# Patient Record
Sex: Male | Born: 1945
Health system: Southern US, Community
[De-identification: ages and names within clinical notes are randomized; demographics above are authoritative.]

## PROBLEM LIST (undated history)

## (undated) DIAGNOSIS — R569 Unspecified convulsions: Secondary | ICD-10-CM

## (undated) DIAGNOSIS — E559 Vitamin D deficiency, unspecified: Secondary | ICD-10-CM

## (undated) DIAGNOSIS — F1010 Alcohol abuse, uncomplicated: Secondary | ICD-10-CM

## (undated) DIAGNOSIS — E538 Deficiency of other specified B group vitamins: Secondary | ICD-10-CM

## (undated) DIAGNOSIS — G40909 Epilepsy, unspecified, not intractable, without status epilepticus: Secondary | ICD-10-CM

## (undated) DIAGNOSIS — R7989 Other specified abnormal findings of blood chemistry: Secondary | ICD-10-CM

## (undated) DIAGNOSIS — J449 Chronic obstructive pulmonary disease, unspecified: Secondary | ICD-10-CM

## (undated) DIAGNOSIS — E785 Hyperlipidemia, unspecified: Secondary | ICD-10-CM

## (undated) DIAGNOSIS — H3530 Unspecified macular degeneration: Secondary | ICD-10-CM

## (undated) HISTORY — DX: Vitamin D deficiency, unspecified: E55.9

## (undated) HISTORY — DX: Other specified abnormal findings of blood chemistry: R79.89

## (undated) HISTORY — DX: Unspecified convulsions: R56.9

## (undated) HISTORY — DX: Alcohol abuse, uncomplicated: F10.10

## (undated) HISTORY — DX: Deficiency of other specified B group vitamins: E53.8

## (undated) HISTORY — DX: Epilepsy, unspecified, not intractable, without status epilepticus: G40.909

## (undated) HISTORY — DX: Chronic obstructive pulmonary disease, unspecified: J44.9

## (undated) HISTORY — DX: Hyperlipidemia, unspecified: E78.5

## (undated) HISTORY — DX: Unspecified macular degeneration: H35.30

---

## 1998-04-26 ENCOUNTER — Emergency Department (HOSPITAL_COMMUNITY): Admission: EM | Admit: 1998-04-26 | Discharge: 1998-04-26 | Payer: Self-pay | Admitting: Emergency Medicine

## 1998-04-26 ENCOUNTER — Encounter: Payer: Self-pay | Admitting: Emergency Medicine

## 2000-03-15 ENCOUNTER — Emergency Department (HOSPITAL_COMMUNITY): Admission: EM | Admit: 2000-03-15 | Discharge: 2000-03-15 | Payer: Self-pay | Admitting: Emergency Medicine

## 2000-03-16 ENCOUNTER — Encounter: Payer: Self-pay | Admitting: Emergency Medicine

## 2000-07-03 ENCOUNTER — Encounter: Payer: Self-pay | Admitting: Emergency Medicine

## 2000-07-03 ENCOUNTER — Emergency Department (HOSPITAL_COMMUNITY): Admission: EM | Admit: 2000-07-03 | Discharge: 2000-07-03 | Payer: Self-pay | Admitting: Emergency Medicine

## 2000-07-21 HISTORY — PX: CARDIAC CATHETERIZATION: SHX172

## 2000-09-21 ENCOUNTER — Ambulatory Visit (HOSPITAL_COMMUNITY): Admission: RE | Admit: 2000-09-21 | Discharge: 2000-09-21 | Payer: Self-pay | Admitting: Cardiology

## 2005-04-10 ENCOUNTER — Emergency Department (HOSPITAL_COMMUNITY): Admission: EM | Admit: 2005-04-10 | Discharge: 2005-04-10 | Payer: Self-pay | Admitting: Emergency Medicine

## 2005-08-16 ENCOUNTER — Emergency Department (HOSPITAL_COMMUNITY): Admission: EM | Admit: 2005-08-16 | Discharge: 2005-08-16 | Payer: Self-pay | Admitting: Emergency Medicine

## 2007-11-23 ENCOUNTER — Emergency Department (HOSPITAL_COMMUNITY): Admission: EM | Admit: 2007-11-23 | Discharge: 2007-11-23 | Payer: Self-pay | Admitting: Emergency Medicine

## 2010-12-06 NOTE — Cardiovascular Report (Signed)
[place]. [facility]  Patient:    Joshua Reed, Joshua Reed                       MRN: [id number] Proc. Date: 09/21/00 Attending:  Armanda Magic, M.D. CC:         Crista Luria, M.D.   Cardiac Catheterization  PROCEDURE:  Left heart catheterization.  INDICATIONS:  Chest pain.  COMPLICATIONS:  None.  HISTORY OF PRESENT ILLNESS:  This is a 65 year old white male with no significant cardiac history in the past but multiple cardiac risk factors including tobacco abuse, alcohol abuse, age, and sex who has been having episodic substernal chest pain with radiation to the left arm and multiple episodes of PND.  He now presents for cardiac catheterization.  DESCRIPTION OF PROCEDURE:  The patient is brought to the cardiac catheterization laboratory in a fasting nonsedated state.  Informed consent was obtained.  The patient was connected to continuous heart rate and pulse oximetry monitoring and intermittent blood pressure monitoring.  The right groin was prepped and draped in a sterile fashion.  Lidocaine, 1%, was used for local anesthesia.  Using the modified Seldinger technique, a 6-French sheath was placed in the right femoral artery.  Under fluoroscopic guidance, a 6-French JL4 catheter was placed into the left coronary artery.  Multiple cine films were taken in the RAO 30-degree and LAO 45-degree angles with cranial and caudal views.  This catheter was then exchanged out over a guide wire for a 6-French JR4 catheter which was placed under fluoroscopic guidance into the right coronary artery.  Multiple cine films were taken again in the 30-degree RAO and 45-degree LAO cranial views.  This catheter was then exchanged out over a guide wire for a 6-French angled pigtail catheter which was placed in the left ventricular cavity.  Left ventriculography was performed in the 30-degree RAO position.  The catheter was then pulled back across the aortic valve and no significant  gradient was obtained.  The catheter was then removed over a guide wire.  At the end of the procedure, all catheters and sheaths were removed.  Manual compression was performed until adequate hemostasis was obtained.  The patient was transferred back to her room in stable condition.  RESULTS: 1. The left main coronary artery is widely patent and bifurcates into a left    anterior descending artery and left circumflex. 2. The left anterior descending artery gives rise to 2 diagonal branches which    are widely patent.  The left anterior descending artery traverses to the    apex and is widely patent throughout its course. 3. The left circumflex artery gives rise to 1 very large branching obtuse    marginal with 2 branches which is widely patent.  The left circumflex then    traverses the AV groove and trifurcates into 3 daughter vessels, all of    which are widely patent. 4. The right coronary artery is widely patent throughout its course and    bifurcates into a posterolateral coronary artery and posterior descending    artery, all of which are widely patent.  The left ventriculography, performed in the RAO position, showed normal left ventricular function, no wall motion abnormalities.  Ejection fraction was 55%.  IMPRESSION: 1. Normal coronary arteries. 2. Noncardiac chest pain. 3. Normal left ventricular function.  PLAN:  Discharge to home later today.  Follow up with Dr. Mayford Knife in 2 weeks. Follow up with Dr. Crista Luria for GI and  possible pulmonary workup. DD:  09/21/00 TD:  09/21/00 Job: [id number]

## 2013-04-15 ENCOUNTER — Other Ambulatory Visit: Payer: Self-pay | Admitting: Family Medicine

## 2013-04-15 DIAGNOSIS — F17200 Nicotine dependence, unspecified, uncomplicated: Secondary | ICD-10-CM

## 2013-05-11 ENCOUNTER — Ambulatory Visit
Admission: RE | Admit: 2013-05-11 | Discharge: 2013-05-11 | Disposition: A | Discharge status: Home / Self Care | Payer: No Typology Code available for payment source | Source: Ambulatory Visit | Attending: Family Medicine | Admitting: Family Medicine

## 2013-05-11 ENCOUNTER — Other Ambulatory Visit: Payer: Self-pay | Admitting: Gastroenterology

## 2013-05-11 ENCOUNTER — Ambulatory Visit
Admission: RE | Admit: 2013-05-11 | Discharge: 2013-05-11 | Disposition: A | Discharge status: Home / Self Care | Payer: No Typology Code available for payment source | Source: Ambulatory Visit | Attending: Gastroenterology | Admitting: Gastroenterology

## 2013-05-11 DIAGNOSIS — F17200 Nicotine dependence, unspecified, uncomplicated: Secondary | ICD-10-CM

## 2013-05-11 DIAGNOSIS — R1011 Right upper quadrant pain: Secondary | ICD-10-CM

## 2013-05-13 ENCOUNTER — Other Ambulatory Visit: Payer: Self-pay

## 2013-05-17 ENCOUNTER — Other Ambulatory Visit: Payer: Self-pay

## 2013-05-18 ENCOUNTER — Ambulatory Visit: Payer: Medicare Other | Admitting: Neurology

## 2013-11-11 ENCOUNTER — Ambulatory Visit
Admission: RE | Admit: 2013-11-11 | Discharge: 2013-11-11 | Disposition: A | Discharge status: Home / Self Care | Payer: Medicare HMO | Source: Ambulatory Visit | Attending: Otolaryngology | Admitting: Otolaryngology

## 2013-11-11 ENCOUNTER — Other Ambulatory Visit: Payer: Self-pay | Admitting: Otolaryngology

## 2013-11-11 DIAGNOSIS — J0100 Acute maxillary sinusitis, unspecified: Secondary | ICD-10-CM

## 2013-11-11 DIAGNOSIS — J40 Bronchitis, not specified as acute or chronic: Secondary | ICD-10-CM

## 2014-01-13 ENCOUNTER — Other Ambulatory Visit: Payer: Self-pay | Admitting: Family Medicine

## 2014-01-13 DIAGNOSIS — R9389 Abnormal findings on diagnostic imaging of other specified body structures: Secondary | ICD-10-CM

## 2014-01-19 ENCOUNTER — Ambulatory Visit
Admission: RE | Admit: 2014-01-19 | Discharge: 2014-01-19 | Disposition: A | Discharge status: Home / Self Care | Payer: Medicare HMO | Source: Ambulatory Visit | Attending: Family Medicine | Admitting: Family Medicine

## 2014-01-19 DIAGNOSIS — R9389 Abnormal findings on diagnostic imaging of other specified body structures: Secondary | ICD-10-CM

## 2014-05-24 ENCOUNTER — Emergency Department (HOSPITAL_COMMUNITY): Payer: Worker's Compensation

## 2014-05-24 ENCOUNTER — Encounter (HOSPITAL_COMMUNITY)
Admission: EM | Disposition: A | Discharge status: Rehabilitation Facility | Payer: Self-pay | Source: Home / Self Care | Attending: Internal Medicine

## 2014-05-24 ENCOUNTER — Inpatient Hospital Stay (HOSPITAL_COMMUNITY): Payer: Worker's Compensation | Admitting: Anesthesiology

## 2014-05-24 ENCOUNTER — Inpatient Hospital Stay (HOSPITAL_COMMUNITY): Payer: Worker's Compensation

## 2014-05-24 ENCOUNTER — Encounter (HOSPITAL_COMMUNITY): Payer: Self-pay

## 2014-05-24 ENCOUNTER — Inpatient Hospital Stay (HOSPITAL_COMMUNITY)
Admission: EM | Admit: 2014-05-24 | Discharge: 2014-05-29 | DRG: 481 | Disposition: A | Discharge status: Rehabilitation Facility | Payer: Worker's Compensation | Attending: Internal Medicine | Admitting: Internal Medicine

## 2014-05-24 DIAGNOSIS — F1010 Alcohol abuse, uncomplicated: Secondary | ICD-10-CM | POA: Diagnosis not present

## 2014-05-24 DIAGNOSIS — S52601A Unspecified fracture of lower end of right ulna, initial encounter for closed fracture: Secondary | ICD-10-CM

## 2014-05-24 DIAGNOSIS — S52501A Unspecified fracture of the lower end of right radius, initial encounter for closed fracture: Secondary | ICD-10-CM | POA: Diagnosis present

## 2014-05-24 DIAGNOSIS — T148XXA Other injury of unspecified body region, initial encounter: Secondary | ICD-10-CM

## 2014-05-24 DIAGNOSIS — M25551 Pain in right hip: Secondary | ICD-10-CM | POA: Insufficient documentation

## 2014-05-24 DIAGNOSIS — J441 Chronic obstructive pulmonary disease with (acute) exacerbation: Secondary | ICD-10-CM | POA: Diagnosis present

## 2014-05-24 DIAGNOSIS — G40909 Epilepsy, unspecified, not intractable, without status epilepticus: Secondary | ICD-10-CM | POA: Diagnosis present

## 2014-05-24 DIAGNOSIS — H3530 Unspecified macular degeneration: Secondary | ICD-10-CM | POA: Diagnosis present

## 2014-05-24 DIAGNOSIS — J449 Chronic obstructive pulmonary disease, unspecified: Secondary | ICD-10-CM | POA: Diagnosis present

## 2014-05-24 DIAGNOSIS — E559 Vitamin D deficiency, unspecified: Secondary | ICD-10-CM | POA: Diagnosis present

## 2014-05-24 DIAGNOSIS — S52611A Displaced fracture of right ulna styloid process, initial encounter for closed fracture: Secondary | ICD-10-CM | POA: Diagnosis present

## 2014-05-24 DIAGNOSIS — E440 Moderate protein-calorie malnutrition: Secondary | ICD-10-CM | POA: Insufficient documentation

## 2014-05-24 DIAGNOSIS — F17200 Nicotine dependence, unspecified, uncomplicated: Secondary | ICD-10-CM | POA: Diagnosis present

## 2014-05-24 DIAGNOSIS — W11XXXA Fall on and from ladder, initial encounter: Secondary | ICD-10-CM | POA: Insufficient documentation

## 2014-05-24 DIAGNOSIS — J410 Simple chronic bronchitis: Secondary | ICD-10-CM

## 2014-05-24 DIAGNOSIS — M21939 Unspecified acquired deformity of unspecified forearm: Secondary | ICD-10-CM

## 2014-05-24 DIAGNOSIS — S72002A Fracture of unspecified part of neck of left femur, initial encounter for closed fracture: Secondary | ICD-10-CM | POA: Insufficient documentation

## 2014-05-24 DIAGNOSIS — D62 Acute posthemorrhagic anemia: Secondary | ICD-10-CM | POA: Diagnosis not present

## 2014-05-24 DIAGNOSIS — S72141A Displaced intertrochanteric fracture of right femur, initial encounter for closed fracture: Secondary | ICD-10-CM | POA: Diagnosis present

## 2014-05-24 DIAGNOSIS — S72009A Fracture of unspecified part of neck of unspecified femur, initial encounter for closed fracture: Secondary | ICD-10-CM | POA: Diagnosis present

## 2014-05-24 DIAGNOSIS — S7291XA Unspecified fracture of right femur, initial encounter for closed fracture: Secondary | ICD-10-CM | POA: Diagnosis present

## 2014-05-24 DIAGNOSIS — W11XXXS Fall on and from ladder, sequela: Secondary | ICD-10-CM | POA: Diagnosis not present

## 2014-05-24 DIAGNOSIS — S52501S Unspecified fracture of the lower end of right radius, sequela: Secondary | ICD-10-CM | POA: Diagnosis not present

## 2014-05-24 DIAGNOSIS — S62101A Fracture of unspecified carpal bone, right wrist, initial encounter for closed fracture: Secondary | ICD-10-CM

## 2014-05-24 DIAGNOSIS — S72141D Displaced intertrochanteric fracture of right femur, subsequent encounter for closed fracture with routine healing: Secondary | ICD-10-CM | POA: Diagnosis not present

## 2014-05-24 HISTORY — PX: OPEN REDUCTION INTERNAL FIXATION (ORIF) DISTAL RADIAL FRACTURE: SHX5989

## 2014-05-24 HISTORY — PX: INTRAMEDULLARY (IM) NAIL INTERTROCHANTERIC: SHX5875

## 2014-05-24 LAB — COMPREHENSIVE METABOLIC PANEL
ALT: 30 U/L (ref 0–53)
AST: 33 U/L (ref 0–37)
Albumin: 3.3 g/dL — ABNORMAL LOW (ref 3.5–5.2)
Alkaline Phosphatase: 102 U/L (ref 39–117)
Anion gap: 12 (ref 5–15)
BUN: 12 mg/dL (ref 6–23)
CALCIUM: 7.8 mg/dL — AB (ref 8.4–10.5)
CHLORIDE: 101 meq/L (ref 96–112)
CO2: 23 meq/L (ref 19–32)
Creatinine, Ser: 0.77 mg/dL (ref 0.50–1.35)
GFR calc Af Amer: >90 mL/min (ref >90)
Glucose, Bld: 98 mg/dL (ref 70–99)
Potassium: 4.8 mEq/L (ref 3.7–5.3)
SODIUM: 136 meq/L — AB (ref 137–147)
Total Protein: 6.6 g/dL (ref 6.0–8.3)

## 2014-05-24 LAB — I-STAT CHEM 8, ED
BUN: 11 mg/dL (ref 6–23)
CHLORIDE: 100 meq/L (ref 96–112)
CREATININE: 0.8 mg/dL (ref 0.50–1.35)
Calcium, Ion: 1.03 mmol/L — ABNORMAL LOW (ref 1.13–1.30)
Glucose, Bld: 100 mg/dL — ABNORMAL HIGH (ref 70–99)
HCT: 34 % — ABNORMAL LOW (ref 39.0–52.0)
Hemoglobin: 11.6 g/dL — ABNORMAL LOW (ref 13.0–17.0)
Potassium: 4.4 mEq/L (ref 3.7–5.3)
SODIUM: 135 meq/L — AB (ref 137–147)
TCO2: 22 mmol/L (ref 0–100)

## 2014-05-24 LAB — SAMPLE TO BLOOD BANK

## 2014-05-24 LAB — CBC
HCT: 31.9 % — ABNORMAL LOW (ref 39.0–52.0)
Hemoglobin: 10.4 g/dL — ABNORMAL LOW (ref 13.0–17.0)
MCH: 27.7 pg (ref 26.0–34.0)
MCHC: 32.6 g/dL (ref 30.0–36.0)
MCV: 85.1 fL (ref 78.0–100.0)
PLATELETS: 220 10*3/uL (ref 150–400)
RBC: 3.75 MIL/uL — ABNORMAL LOW (ref 4.22–5.81)
RDW: 15.7 % — ABNORMAL HIGH (ref 11.5–15.5)
WBC: 10.8 10*3/uL — ABNORMAL HIGH (ref 4.0–10.5)

## 2014-05-24 LAB — PROTIME-INR
INR: 1.23 (ref 0.00–1.49)
Prothrombin Time: 15.7 seconds — ABNORMAL HIGH (ref 11.6–15.2)

## 2014-05-24 LAB — ETHANOL: Alcohol, Ethyl (B): <11 mg/dL (ref 0–11)

## 2014-05-24 SURGERY — OPEN REDUCTION INTERNAL FIXATION (ORIF) DISTAL RADIUS FRACTURE
Anesthesia: General | Site: Wrist | Laterality: Right

## 2014-05-24 MED ORDER — OXYCODONE HCL 5 MG PO TABS
ORAL_TABLET | ORAL | Status: AC
  Filled 2014-05-24: qty 1

## 2014-05-24 MED ORDER — ALUM & MAG HYDROXIDE-SIMETH 200-200-20 MG/5ML PO SUSP: 30.0000 mL | ORAL | Status: DC | PRN

## 2014-05-24 MED ORDER — POLYETHYLENE GLYCOL 3350 17 G PO PACK: 17.0000 g | PACK | Freq: Every day | ORAL | Status: DC | PRN

## 2014-05-24 MED ORDER — DEXAMETHASONE SODIUM PHOSPHATE 4 MG/ML IJ SOLN
INTRAMUSCULAR | Status: AC
  Filled 2014-05-24: qty 1

## 2014-05-24 MED ORDER — ONDANSETRON HCL 4 MG PO TABS: 4.0000 mg | ORAL_TABLET | Freq: Four times a day (QID) | ORAL | Status: DC | PRN

## 2014-05-24 MED ORDER — PHENYTOIN SODIUM EXTENDED 30 MG PO CAPS
250.0000 mg | ORAL_CAPSULE | Freq: Two times a day (BID) | ORAL | Status: DC
  Filled 2014-05-24 (×2): qty 5

## 2014-05-24 MED ORDER — ENOXAPARIN SODIUM 40 MG/0.4ML ~~LOC~~ SOLN
40.0000 mg | SUBCUTANEOUS | Status: DC
  Administered 2014-05-25 – 2014-05-29 (×5): 40 mg via SUBCUTANEOUS
  Filled 2014-05-24 (×6): qty 0.4

## 2014-05-24 MED ORDER — SODIUM CHLORIDE 0.9 % IJ SOLN
INTRAMUSCULAR | Status: AC
  Filled 2014-05-24: qty 10

## 2014-05-24 MED ORDER — ONDANSETRON HCL 4 MG/2ML IJ SOLN: 4.0000 mg | Freq: Four times a day (QID) | INTRAMUSCULAR | Status: DC | PRN

## 2014-05-24 MED ORDER — SUCCINYLCHOLINE CHLORIDE 20 MG/ML IJ SOLN
INTRAMUSCULAR | Status: AC
  Filled 2014-05-24: qty 1

## 2014-05-24 MED ORDER — EPHEDRINE SULFATE 50 MG/ML IJ SOLN
INTRAMUSCULAR | Status: DC | PRN
  Administered 2014-05-24: 10 mg via INTRAVENOUS
  Administered 2014-05-24: 5 mg via INTRAVENOUS

## 2014-05-24 MED ORDER — SUFENTANIL CITRATE 50 MCG/ML IV SOLN
INTRAVENOUS | Status: AC
  Filled 2014-05-24: qty 1

## 2014-05-24 MED ORDER — PHENYLEPHRINE HCL 10 MG/ML IJ SOLN
INTRAMUSCULAR | Status: DC | PRN
  Administered 2014-05-24 (×5): 80 ug via INTRAVENOUS

## 2014-05-24 MED ORDER — SODIUM CHLORIDE 0.9 % IV SOLN
INTRAVENOUS | Status: DC
  Administered 2014-05-24: 17:00:00 via INTRAVENOUS

## 2014-05-24 MED ORDER — LIDOCAINE HCL (CARDIAC) 20 MG/ML IV SOLN
INTRAVENOUS | Status: DC | PRN
  Administered 2014-05-24: 100 mg via INTRAVENOUS

## 2014-05-24 MED ORDER — PROPOFOL 10 MG/ML IV BOLUS
INTRAVENOUS | Status: AC
  Filled 2014-05-24: qty 20

## 2014-05-24 MED ORDER — ONDANSETRON HCL 4 MG/2ML IJ SOLN
INTRAMUSCULAR | Status: AC
  Filled 2014-05-24: qty 2

## 2014-05-24 MED ORDER — ACETAMINOPHEN 325 MG PO TABS: 650.0000 mg | ORAL_TABLET | Freq: Four times a day (QID) | ORAL | Status: DC | PRN

## 2014-05-24 MED ORDER — METHOCARBAMOL 500 MG PO TABS
500.0000 mg | ORAL_TABLET | Freq: Four times a day (QID) | ORAL | Status: DC | PRN
  Administered 2014-05-25 – 2014-05-29 (×11): 500 mg via ORAL
  Filled 2014-05-24 (×11): qty 1

## 2014-05-24 MED ORDER — MENTHOL 3 MG MT LOZG: 1.0000 | LOZENGE | OROMUCOSAL | Status: DC | PRN

## 2014-05-24 MED ORDER — DIAZEPAM 5 MG/ML IJ SOLN
2.0000 mg | Freq: Once | INTRAMUSCULAR | Status: AC
  Administered 2014-05-24: 2 mg via INTRAVENOUS
  Filled 2014-05-24: qty 2

## 2014-05-24 MED ORDER — ACETAMINOPHEN 650 MG RE SUPP: 650.0000 mg | Freq: Four times a day (QID) | RECTAL | Status: DC | PRN

## 2014-05-24 MED ORDER — BUPIVACAINE HCL (PF) 0.25 % IJ SOLN
INTRAMUSCULAR | Status: AC
  Filled 2014-05-24: qty 30

## 2014-05-24 MED ORDER — MORPHINE SULFATE 4 MG/ML IJ SOLN
4.0000 mg | Freq: Once | INTRAMUSCULAR | Status: AC
  Administered 2014-05-24: 4 mg via INTRAVENOUS
  Filled 2014-05-24: qty 1

## 2014-05-24 MED ORDER — CEFAZOLIN SODIUM-DEXTROSE 2-3 GM-% IV SOLR
INTRAVENOUS | Status: DC | PRN
  Administered 2014-05-24: 2 g via INTRAVENOUS

## 2014-05-24 MED ORDER — HYDROMORPHONE HCL 1 MG/ML IJ SOLN
0.2500 mg | INTRAMUSCULAR | Status: DC | PRN
  Administered 2014-05-24: 0.5 mg via INTRAVENOUS

## 2014-05-24 MED ORDER — OXYCODONE HCL 5 MG PO TABS: 5.0000 mg | ORAL_TABLET | ORAL | Status: DC | PRN

## 2014-05-24 MED ORDER — LACTATED RINGERS IV SOLN
INTRAVENOUS | Status: DC | PRN
  Administered 2014-05-24 (×2): via INTRAVENOUS

## 2014-05-24 MED ORDER — PHENOL 1.4 % MT LIQD: 1.0000 | OROMUCOSAL | Status: DC | PRN

## 2014-05-24 MED ORDER — HYDROMORPHONE HCL 1 MG/ML IJ SOLN
1.0000 mg | Freq: Once | INTRAMUSCULAR | Status: DC
  Filled 2014-05-24: qty 1

## 2014-05-24 MED ORDER — MORPHINE SULFATE 2 MG/ML IJ SOLN
0.5000 mg | INTRAMUSCULAR | Status: DC | PRN
  Administered 2014-05-25 – 2014-05-28 (×9): 0.5 mg via INTRAVENOUS
  Filled 2014-05-24 (×11): qty 1

## 2014-05-24 MED ORDER — HYDROCODONE-ACETAMINOPHEN 5-325 MG PO TABS: 1.0000 | ORAL_TABLET | Freq: Four times a day (QID) | ORAL | Status: DC | PRN

## 2014-05-24 MED ORDER — EPHEDRINE SULFATE 50 MG/ML IJ SOLN
INTRAMUSCULAR | Status: AC
  Filled 2014-05-24: qty 1

## 2014-05-24 MED ORDER — TETANUS-DIPHTH-ACELL PERTUSSIS 5-2.5-18.5 LF-MCG/0.5 IM SUSP
0.5000 mL | Freq: Once | INTRAMUSCULAR | Status: AC
  Administered 2014-05-24: 0.5 mL via INTRAMUSCULAR
  Filled 2014-05-24: qty 0.5

## 2014-05-24 MED ORDER — PHENYLEPHRINE 40 MCG/ML (10ML) SYRINGE FOR IV PUSH (FOR BLOOD PRESSURE SUPPORT)
PREFILLED_SYRINGE | INTRAVENOUS | Status: AC
  Filled 2014-05-24: qty 10

## 2014-05-24 MED ORDER — DEXAMETHASONE SODIUM PHOSPHATE 4 MG/ML IJ SOLN
INTRAMUSCULAR | Status: DC | PRN
  Administered 2014-05-24: 4 mg via INTRAVENOUS

## 2014-05-24 MED ORDER — PROPOFOL 10 MG/ML IV BOLUS
INTRAVENOUS | Status: DC | PRN
  Administered 2014-05-24: 50 mg via INTRAVENOUS
  Administered 2014-05-24: 150 mg via INTRAVENOUS

## 2014-05-24 MED ORDER — MIDAZOLAM HCL 2 MG/2ML IJ SOLN
INTRAMUSCULAR | Status: AC
  Filled 2014-05-24: qty 2

## 2014-05-24 MED ORDER — HYDROMORPHONE HCL 1 MG/ML IJ SOLN
1.0000 mg | Freq: Once | INTRAMUSCULAR | Status: AC
  Administered 2014-05-24: 1 mg via INTRAVENOUS
  Filled 2014-05-24: qty 1

## 2014-05-24 MED ORDER — METOCLOPRAMIDE HCL 10 MG PO TABS: 5.0000 mg | ORAL_TABLET | Freq: Three times a day (TID) | ORAL | Status: DC | PRN

## 2014-05-24 MED ORDER — TIOTROPIUM BROMIDE MONOHYDRATE 18 MCG IN CAPS
18.0000 ug | ORAL_CAPSULE | Freq: Every day | RESPIRATORY_TRACT | Status: DC
  Administered 2014-05-25 – 2014-05-28 (×4): 18 ug via RESPIRATORY_TRACT
  Filled 2014-05-24 (×3): qty 5

## 2014-05-24 MED ORDER — CEFAZOLIN SODIUM-DEXTROSE 2-3 GM-% IV SOLR
2.0000 g | Freq: Four times a day (QID) | INTRAVENOUS | Status: AC
  Administered 2014-05-25 (×3): 2 g via INTRAVENOUS
  Filled 2014-05-24 (×4): qty 50

## 2014-05-24 MED ORDER — ENOXAPARIN SODIUM 40 MG/0.4ML ~~LOC~~ SOLN: 40.0000 mg | Freq: Every day | SUBCUTANEOUS | Status: DC

## 2014-05-24 MED ORDER — 0.9 % SODIUM CHLORIDE (POUR BTL) OPTIME
TOPICAL | Status: DC | PRN
  Administered 2014-05-24 (×2): 1000 mL

## 2014-05-24 MED ORDER — MORPHINE SULFATE 2 MG/ML IJ SOLN: 0.5000 mg | INTRAMUSCULAR | Status: DC | PRN

## 2014-05-24 MED ORDER — CEFAZOLIN SODIUM-DEXTROSE 2-3 GM-% IV SOLR
INTRAVENOUS | Status: AC
  Filled 2014-05-24: qty 50

## 2014-05-24 MED ORDER — HEPARIN SODIUM (PORCINE) 5000 UNIT/ML IJ SOLN: 5000.0000 [IU] | Freq: Three times a day (TID) | INTRAMUSCULAR | Status: DC

## 2014-05-24 MED ORDER — SODIUM CHLORIDE 0.9 % IV SOLN: INTRAVENOUS | Status: DC

## 2014-05-24 MED ORDER — MAGNESIUM CITRATE PO SOLN: 1.0000 | Freq: Once | ORAL | Status: AC | PRN

## 2014-05-24 MED ORDER — ONDANSETRON HCL 4 MG/2ML IJ SOLN
INTRAMUSCULAR | Status: DC | PRN
  Administered 2014-05-24: 4 mg via INTRAVENOUS

## 2014-05-24 MED ORDER — METOPROLOL TARTRATE 1 MG/ML IV SOLN
5.0000 mg | INTRAVENOUS | Status: DC | PRN
  Filled 2014-05-24: qty 5

## 2014-05-24 MED ORDER — METOCLOPRAMIDE HCL 5 MG/ML IJ SOLN: 5.0000 mg | Freq: Three times a day (TID) | INTRAMUSCULAR | Status: DC | PRN

## 2014-05-24 MED ORDER — SODIUM CHLORIDE 0.9 % IV SOLN
INTRAVENOUS | Status: DC | PRN
  Administered 2014-05-24: 19:00:00 via INTRAVENOUS

## 2014-05-24 MED ORDER — HYDROCODONE-ACETAMINOPHEN 5-325 MG PO TABS
1.0000 | ORAL_TABLET | Freq: Four times a day (QID) | ORAL | Status: DC | PRN
  Administered 2014-05-25 – 2014-05-28 (×11): 2 via ORAL
  Filled 2014-05-24 (×11): qty 2

## 2014-05-24 MED ORDER — LACTATED RINGERS IV SOLN: INTRAVENOUS | Status: DC | PRN

## 2014-05-24 MED ORDER — IOHEXOL 300 MG/ML  SOLN
100.0000 mL | Freq: Once | INTRAMUSCULAR | Status: AC | PRN
  Administered 2014-05-24: 90 mL via INTRAVENOUS

## 2014-05-24 MED ORDER — SORBITOL 70 % SOLN: 30.0000 mL | Freq: Every day | Status: DC | PRN

## 2014-05-24 MED ORDER — HYDROMORPHONE HCL 1 MG/ML IJ SOLN
INTRAMUSCULAR | Status: AC
  Filled 2014-05-24: qty 1

## 2014-05-24 MED ORDER — ALBUTEROL SULFATE (2.5 MG/3ML) 0.083% IN NEBU
2.5000 mg | INHALATION_SOLUTION | RESPIRATORY_TRACT | Status: DC | PRN
  Administered 2014-05-27 (×2): 2.5 mg via RESPIRATORY_TRACT
  Filled 2014-05-24 (×2): qty 3

## 2014-05-24 MED ORDER — VITAMIN D3 25 MCG (1000 UNIT) PO TABS
1000.0000 [IU] | ORAL_TABLET | Freq: Every day | ORAL | Status: DC
  Administered 2014-05-25 – 2014-05-29 (×5): 1000 [IU] via ORAL
  Filled 2014-05-24 (×5): qty 1

## 2014-05-24 MED ORDER — MIDAZOLAM HCL 5 MG/5ML IJ SOLN
INTRAMUSCULAR | Status: DC | PRN
  Administered 2014-05-24: 2 mg via INTRAVENOUS

## 2014-05-24 MED ORDER — SENNA 8.6 MG PO TABS
1.0000 | ORAL_TABLET | Freq: Two times a day (BID) | ORAL | Status: DC
  Administered 2014-05-25 – 2014-05-29 (×9): 8.6 mg via ORAL
  Filled 2014-05-24 (×11): qty 1

## 2014-05-24 MED ORDER — SUCCINYLCHOLINE CHLORIDE 20 MG/ML IJ SOLN
INTRAMUSCULAR | Status: DC | PRN
  Administered 2014-05-24: 100 mg via INTRAVENOUS

## 2014-05-24 MED ORDER — DEXTROSE 5 % IV SOLN: 500.0000 mg | Freq: Four times a day (QID) | INTRAVENOUS | Status: DC | PRN

## 2014-05-24 MED ORDER — GUAIFENESIN-DM 100-10 MG/5ML PO SYRP
5.0000 mL | ORAL_SOLUTION | ORAL | Status: DC | PRN
  Filled 2014-05-24: qty 5

## 2014-05-24 MED ORDER — PROMETHAZINE HCL 25 MG/ML IJ SOLN: 6.2500 mg | INTRAMUSCULAR | Status: DC | PRN

## 2014-05-24 MED ORDER — SUFENTANIL CITRATE 50 MCG/ML IV SOLN
INTRAVENOUS | Status: DC | PRN
  Administered 2014-05-24: 10 ug via INTRAVENOUS

## 2014-05-24 MED ORDER — OXYCODONE HCL 5 MG PO TABS
5.0000 mg | ORAL_TABLET | ORAL | Status: DC | PRN
  Administered 2014-05-25 – 2014-05-29 (×13): 10 mg via ORAL
  Filled 2014-05-24 (×13): qty 2

## 2014-05-24 MED ORDER — HYDROMORPHONE HCL 1 MG/ML IJ SOLN
1.0000 mg | Freq: Once | INTRAMUSCULAR | Status: AC
  Administered 2014-05-24: 1 mg via INTRAVENOUS

## 2014-05-24 SURGICAL SUPPLY — 86 items
BANDAGE ELASTIC 3 VELCRO ST LF (GAUZE/BANDAGES/DRESSINGS) ×3 IMPLANT
BANDAGE ELASTIC 4 VELCRO ST LF (GAUZE/BANDAGES/DRESSINGS) ×3 IMPLANT
BIT DRILL 2.2 STE (BIT) ×3 IMPLANT
BLADE SURG 10 STRL SS (BLADE) ×3 IMPLANT
BLADE SURG 15 STRL LF DISP TIS (BLADE) ×4 IMPLANT
BLADE SURG 15 STRL SS (BLADE) ×6
BLADE SURG ROTATE 9660 (MISCELLANEOUS) IMPLANT
BNDG CMPR 9X4 STRL LF SNTH (GAUZE/BANDAGES/DRESSINGS) ×2
BNDG COHESIVE 4X5 TAN NS LF (GAUZE/BANDAGES/DRESSINGS) ×3 IMPLANT
BNDG COHESIVE 6X5 TAN STRL LF (GAUZE/BANDAGES/DRESSINGS) IMPLANT
BNDG ESMARK 4X9 LF (GAUZE/BANDAGES/DRESSINGS) ×3 IMPLANT
BNDG GAUZE ELAST 4 BULKY (GAUZE/BANDAGES/DRESSINGS) ×3 IMPLANT
CORDS BIPOLAR (ELECTRODE) ×3 IMPLANT
COVER MAYO STAND STRL (DRAPES) ×3 IMPLANT
COVER PERINEAL POST (MISCELLANEOUS) ×3 IMPLANT
COVER SURGICAL LIGHT HANDLE (MISCELLANEOUS) ×6 IMPLANT
CUFF TOURNIQUET SINGLE 18IN (TOURNIQUET CUFF) ×3 IMPLANT
CUFF TOURNIQUET SINGLE 24IN (TOURNIQUET CUFF) IMPLANT
DRAPE C-ARM 42X72 X-RAY (DRAPES) IMPLANT
DRAPE PROXIMA HALF (DRAPES) IMPLANT
DRAPE STERI IOBAN 125X83 (DRAPES) ×3 IMPLANT
DRAPE U-SHAPE 47X51 STRL (DRAPES) IMPLANT
DRSG MEPILEX BORDER 4X4 (GAUZE/BANDAGES/DRESSINGS) ×3 IMPLANT
DRSG MEPILEX BORDER 4X8 (GAUZE/BANDAGES/DRESSINGS) ×3 IMPLANT
DRSG PAD ABDOMINAL 8X10 ST (GAUZE/BANDAGES/DRESSINGS) IMPLANT
DURAPREP 26ML APPLICATOR (WOUND CARE) ×3 IMPLANT
ELECT CAUTERY BLADE 6.4 (BLADE) ×6 IMPLANT
ELECT REM PT RETURN 9FT ADLT (ELECTROSURGICAL) ×3
ELECTRODE REM PT RTRN 9FT ADLT (ELECTROSURGICAL) ×2 IMPLANT
FACESHIELD WRAPAROUND (MASK) ×3 IMPLANT
GAUZE SPONGE 4X4 12PLY STRL (GAUZE/BANDAGES/DRESSINGS) ×3 IMPLANT
GAUZE SPONGE 4X4 16PLY XRAY LF (GAUZE/BANDAGES/DRESSINGS) ×3 IMPLANT
GAUZE XEROFORM 1X8 LF (GAUZE/BANDAGES/DRESSINGS) ×3 IMPLANT
GAUZE XEROFORM 5X9 LF (GAUZE/BANDAGES/DRESSINGS) ×3 IMPLANT
GLOVE BIO SURGEON STRL SZ 6.5 (GLOVE) ×3 IMPLANT
GLOVE NEODERM STRL 7.5 LF PF (GLOVE) ×2 IMPLANT
GLOVE SURG NEODERM 7.5  LF PF (GLOVE) ×1
GLOVE SURG SS PI 7.5 STRL IVOR (GLOVE) ×3 IMPLANT
GLOVE SURG SYN 7.5  E (GLOVE) ×2
GLOVE SURG SYN 7.5 E (GLOVE) ×4 IMPLANT
GOWN STRL REIN XL XLG (GOWN DISPOSABLE) ×3 IMPLANT
GOWN STRL REUS W/ TWL LRG LVL3 (GOWN DISPOSABLE) ×2 IMPLANT
GOWN STRL REUS W/TWL LRG LVL3 (GOWN DISPOSABLE) ×3
GUIDEPIN 3.2X17.5 THRD DISP (PIN) ×3 IMPLANT
K-WIRE 1.6 (WIRE) ×9
K-WIRE FX5X1.6XNS BN SS (WIRE) ×6
KIT BASIN OR (CUSTOM PROCEDURE TRAY) ×6 IMPLANT
KIT ROOM TURNOVER OR (KITS) ×6 IMPLANT
KWIRE FX5X1.6XNS BN SS (WIRE) ×6 IMPLANT
LINER BOOT UNIVERSAL DISP (MISCELLANEOUS) ×3 IMPLANT
MANIFOLD NEPTUNE II (INSTRUMENTS) IMPLANT
NAIL HIP FRACTURE 11X380MM (Nail) ×3 IMPLANT
NEEDLE 22X1 1/2 (OR ONLY) (NEEDLE) IMPLANT
NS IRRIG 1000ML POUR BTL (IV SOLUTION) ×6 IMPLANT
PACK GENERAL/GYN (CUSTOM PROCEDURE TRAY) ×3 IMPLANT
PACK ORTHO EXTREMITY (CUSTOM PROCEDURE TRAY) ×3 IMPLANT
PAD ARMBOARD 7.5X6 YLW CONV (MISCELLANEOUS) ×12 IMPLANT
PAD CAST 4YDX4 CTTN HI CHSV (CAST SUPPLIES) ×4 IMPLANT
PADDING CAST COTTON 4X4 STRL (CAST SUPPLIES) ×6
PLATE DVR CROSSLOCK STD RT (Plate) ×3 IMPLANT
SCREW LAG 10.5MMX105MM HFN (Screw) ×3 IMPLANT
SCREW LOCK 16X2.7X 3 LD TPR (Screw) ×8 IMPLANT
SCREW LOCK 22X2.7X 3 LD TPR (Screw) ×6 IMPLANT
SCREW LOCK 24X2.7X3 LD THRD (Screw) ×6 IMPLANT
SCREW LOCKING 2.7X16 (Screw) ×12 IMPLANT
SCREW LOCKING 2.7X22MM (Screw) ×9 IMPLANT
SCREW LOCKING 2.7X24MM (Screw) ×9 IMPLANT
SCREW NLOCK 26X2.7X3 LD THRD (Screw) ×4 IMPLANT
SCREW NONLOCK 2.7X26MM (Screw) ×4 IMPLANT
SPLINT FIBERGLASS 3X12 (CAST SUPPLIES) ×3 IMPLANT
SPONGE LAP 4X18 X RAY DECT (DISPOSABLE) IMPLANT
STAPLER VISISTAT 35W (STAPLE) IMPLANT
SUT ETHILON 4 0 PS 2 18 (SUTURE) ×3 IMPLANT
SUT MNCRL AB 4-0 PS2 18 (SUTURE) IMPLANT
SUT PROLENE 3 0 PS 2 (SUTURE) IMPLANT
SUT VIC AB 0 CT1 27 (SUTURE) ×2
SUT VIC AB 0 CT1 27XBRD ANBCTR (SUTURE) ×2 IMPLANT
SUT VIC AB 2-0 CT1 27 (SUTURE) ×6
SUT VIC AB 2-0 CT1 TAPERPNT 27 (SUTURE) ×4 IMPLANT
SUT VIC AB 3-0 FS2 27 (SUTURE) IMPLANT
SYR CONTROL 10ML LL (SYRINGE) IMPLANT
TOWEL OR 17X24 6PK STRL BLUE (TOWEL DISPOSABLE) ×6 IMPLANT
TOWEL OR 17X26 10 PK STRL BLUE (TOWEL DISPOSABLE) ×6 IMPLANT
TUBE CONNECTING 12X1/4 (SUCTIONS) ×3 IMPLANT
UNDERPAD 30X30 INCONTINENT (UNDERPADS AND DIAPERS) IMPLANT
WATER STERILE IRR 1000ML POUR (IV SOLUTION) ×3 IMPLANT

## 2014-05-24 NOTE — ED Notes (Signed)
Pt taken to radiology

## 2014-05-24 NOTE — H&P (Signed)
Patient Demographics  Joshua Reed, is a 68 y.o. male  MRN: [id number]   DOB - [birth year]  Admit Date - 05/24/2014  Outpatient Primary MD for the patient is Gennette Pac, MD   With History of -  Past Medical History  Diagnosis Date  . Seizure disorder 2205    ALCOHOL RELATED  . Macular degeneration   . COPD (chronic obstructive pulmonary disease)   . Alcohol abuse   . Vitamin D deficiency       Past Surgical History  Procedure Laterality Date  . Cardiac catheterization  2002    normal    in for   Chief Complaint  Patient presents with  . Fall     HPI  Joshua Reed  is a 68 y.o. male,with H/O seizures last 21yr ago, COPD, smoking, remote H/O alcohol abuse, works as an Clinical biochemist, was at work and climbing a ladder-slipped fell on concrete, hit his R side, had ++ pain came to the ER found to have R femur and wrist fracture, Trauma Dr wyatt called cleared by him, seen by Ortho Dr Erlinda Hong who requested [place] to admit.  ROS -ve except pain, is highly active, no CAD-CHF-COPD, does smoke.    Review of Systems    In addition to the HPI above,   No Fever-chills, No Headache, No changes with Vision or hearing, No problems swallowing food or Liquids, No Chest pain, Cough or Shortness of Breath, No Abdominal pain, No Nausea or Vommitting, Bowel movements are regular, No Blood in stool or Urine, No dysuria, No new skin rashes or bruises, No new joints pains-aches, except R hip n wrist pain No new weakness, tingling, numbness in any extremity, No recent weight gain or loss, No polyuria, polydypsia or polyphagia, No significant Mental Stressors.  A full 10 point Review of Systems was done, except as stated above, all other Review of Systems were negative.   Social History History  Substance Use  Topics  . Smoking status: Current Every Day Smoker    Types: Cigarettes    Last Attempt to Quit: 05/14/2011  . Smokeless tobacco: Not on file  . Alcohol Use: No     Comment: quit 2-3  years ago      Family History Family History  Problem Relation Age of Onset  . Heart attack Father       Prior to Admission medications   Medication Sig Start Date End Date Taking? Authorizing Provider  phenytoin (DILANTIN) 100 MG ER capsule Take 500 mg by mouth every morning.    Yes Historical Provider, MD  cholecalciferol (VITAMIN D) 1000 UNITS tablet Take 1,000 Units by mouth daily.    Historical Provider, MD    No Known Allergies  Physical Exam  Vitals  Blood pressure 131/67, pulse 80, temperature 98.5 F (36.9 C), temperature source Oral, resp. rate 13, height 5\' 8"  (1.727 m), weight 58.968 kg (130 lb), SpO2 100 %.   1. General middle  aged white male lying in bed in mild pain  2. Normal affect and insight, Not Suicidal or Homicidal, Awake Alert, Oriented X 3.  3. No F.N deficits, ALL C.Nerves Intact, Strength 5/5 all 4 extremities, Sensation intact all 4 extremities, Plantars down going.  4. Ears and Eyes appear Normal, Conjunctivae clear, PERRLA. Moist Oral Mucosa.  5. Supple Neck, No JVD, No cervical lymphadenopathy appriciated, No Carotid Bruits.  6. Symmetrical Chest wall movement, Good air movement bilaterally, CTAB.  7. RRR, No Gallops, Rubs or Murmurs, No Parasternal Heave.  8. Positive Bowel Sounds, Abdomen Soft, No tenderness, No organomegaly appriciated,No rebound -guarding or rigidity.  9.  No Cyanosis, Normal Skin Turgor, No Skin Rash or Bruise.  10. Good muscle tone,  joints appear normal , no effusions, Normal ROM. R wrist in splint, R hip tender, right leg externally rotated and slightly shortened  11. No Palpable Lymph Nodes in Neck or Axillae     Data Review  CBC  Recent Labs Lab 05/24/14 1614 05/24/14 1624  WBC 10.8*  --   HGB 10.4* 11.6*  HCT  31.9* 34.0*  PLT 220  --   MCV 85.1  --   MCH 27.7  --   MCHC 32.6  --   RDW 15.7*  --    ------------------------------------------------------------------------------------------------------------------  Chemistries   Recent Labs Lab 05/24/14 1614 05/24/14 1624  NA 136* 135*  K 4.8 4.4  CL 101 100  CO2 23  --   GLUCOSE 98 100*  BUN 12 11  CREATININE 0.77 0.80  CALCIUM 7.8*  --   AST 33  --   ALT 30  --   ALKPHOS 102  --   BILITOT <0.2*  --    ------------------------------------------------------------------------------------------------------------------ estimated creatinine clearance is 74.8 mL/min (by C-G formula based on Cr of 0.8). ------------------------------------------------------------------------------------------------------------------ No results for input(s): TSH, T4TOTAL, T3FREE, THYROIDAB in the last 72 hours.  Invalid input(s): FREET3   Coagulation profile  Recent Labs Lab 05/24/14 1614  INR 1.23   ------------------------------------------------------------------------------------------------------------------- No results for input(s): DDIMER in the last 72 hours. -------------------------------------------------------------------------------------------------------------------  Cardiac Enzymes No results for input(s): CKMB, TROPONINI, MYOGLOBIN in the last 168 hours.  Invalid input(s): CK ------------------------------------------------------------------------------------------------------------------ Invalid input(s): POCBNP   ---------------------------------------------------------------------------------------------------------------  Urinalysis No results found for: COLORURINE, APPEARANCEUR, [place], [place], GLUCOSEU, HGBUR, BILIRUBINUR, [place], PROTEINUR, UROBILINOGEN, NITRITE, LEUKOCYTESUR  ----------------------------------------------------------------------------------------------------------------  Imaging results:    Dg Wrist 2 Views Right  05/24/2014   CLINICAL DATA:  [place] off ladder today from 10 feet. Deformity of right wrist. Right wrist pain. Initial encounter.  EXAM: RIGHT WRIST - 2 VIEW  COMPARISON:  None.  FINDINGS: Study is limited due to patient condition and limited positioning. Comminuted fracture noted through the distal right radius with impaction. No definite dislocation. Ulnar styloid fracture. Diffuse soft tissue swelling noted.  IMPRESSION: Comminuted, impacted distal right radial fracture. Distal ulnar/ulnar styloid fracture.   Electronically Signed   By: Rolm Baptise M.D.   On: 05/24/2014 15:49   Ct Chest W Contrast  05/24/2014   CLINICAL DATA:  Trauma with fall from ladder 6-8 feet. Unable to raise arms above head.  EXAM: CT CHEST, ABDOMEN, AND PELVIS WITH CONTRAST  TECHNIQUE: Multidetector CT imaging of the chest, abdomen and pelvis was performed following the standard protocol during bolus administration of intravenous contrast.  CONTRAST:  22mL OMNIPAQUE IOHEXOL 300 MG/ML  SOLN  COMPARISON:  CT chest 01/19/2014  FINDINGS: CT CHEST FINDINGS  The lungs are well inflated without consolidation or effusion. There is  no pneumothorax. There is mild centrilobular emphysematous disease. Airways are within normal. Heart is normal in size. There is minimal calcified plaque over the thoracic aorta. Remaining mediastinal structures are unremarkable.  CT ABDOMEN AND PELVIS FINDINGS  The exam demonstrates a normal spleen, pancreas, gallbladder and adrenal glands. There is minimal stable atrophy over the anterior right lobe of the liver as a liver is otherwise unremarkable. Kidneys are within normal. There is calcified plaque over the abdominal aorta. There is no free fluid or free peritoneal air. Appendix is not seen. There is a slight increased number of small mesenteric lymph nodes. There is a 1.2 cm oval low-density focus within the mesentery of the mid abdomen left of midline just below the aortic  bifurcation which may represent a small mesenteric cyst or low-density lymph node.  Pelvic images demonstrate mild bladder distention. Prostate and rectum are within normal. No free fluid.  There is a displaced intertrochanteric fracture of the right hip. There is a subtle nondisplaced fracture of the right inferior pubic ramus. Remaining bones soft tissues are unremarkable.  IMPRESSION: Displaced right intertrochanteric hip fracture. Nondisplaced fracture of the right inferior pubic ramus.  No acute findings within the chest. No acute intra-abdominal findings.  Minimal nonspecific increased number of small mesenteric lymph nodes. Recommend followup abdominal pelvic CT 3 months.  Minimal centrilobular emphysematous disease.  These results were called by telephone at the time of interpretation on 05/24/2014 at 5:28 pm to Dr. Ermalinda Memos, who verbally acknowledged these results.   Electronically Signed   By: Marin Olp M.D.   On: 05/24/2014 17:28   Ct Abdomen Pelvis W Contrast  05/24/2014   CLINICAL DATA:  Trauma with fall from ladder 6-8 feet. Unable to raise arms above head.  EXAM: CT CHEST, ABDOMEN, AND PELVIS WITH CONTRAST  TECHNIQUE: Multidetector CT imaging of the chest, abdomen and pelvis was performed following the standard protocol during bolus administration of intravenous contrast.  CONTRAST:  35mL OMNIPAQUE IOHEXOL 300 MG/ML  SOLN  COMPARISON:  CT chest 01/19/2014  FINDINGS: CT CHEST FINDINGS  The lungs are well inflated without consolidation or effusion. There is no pneumothorax. There is mild centrilobular emphysematous disease. Airways are within normal. Heart is normal in size. There is minimal calcified plaque over the thoracic aorta. Remaining mediastinal structures are unremarkable.  CT ABDOMEN AND PELVIS FINDINGS  The exam demonstrates a normal spleen, pancreas, gallbladder and adrenal glands. There is minimal stable atrophy over the anterior right lobe of the liver as a liver is otherwise  unremarkable. Kidneys are within normal. There is calcified plaque over the abdominal aorta. There is no free fluid or free peritoneal air. Appendix is not seen. There is a slight increased number of small mesenteric lymph nodes. There is a 1.2 cm oval low-density focus within the mesentery of the mid abdomen left of midline just below the aortic bifurcation which may represent a small mesenteric cyst or low-density lymph node.  Pelvic images demonstrate mild bladder distention. Prostate and rectum are within normal. No free fluid.  There is a displaced intertrochanteric fracture of the right hip. There is a subtle nondisplaced fracture of the right inferior pubic ramus. Remaining bones soft tissues are unremarkable.  IMPRESSION: Displaced right intertrochanteric hip fracture. Nondisplaced fracture of the right inferior pubic ramus.  No acute findings within the chest. No acute intra-abdominal findings.  Minimal nonspecific increased number of small mesenteric lymph nodes. Recommend followup abdominal pelvic CT 3 months.  Minimal centrilobular emphysematous disease.  These results  were called by telephone at the time of interpretation on 05/24/2014 at 5:28 pm to Dr. Ermalinda Memos, who verbally acknowledged these results.   Electronically Signed   By: Marin Olp M.D.   On: 05/24/2014 17:28   Dg Pelvis Portable  05/24/2014   CLINICAL DATA:  Patient fell 8 feet off a ladder with right hip region pain  EXAM: PORTABLE PELVIS 1-2 VIEWS  COMPARISON:  None.  FINDINGS: There is an intertrochanteric femur fracture on the right with mild varus angulation at the fracture site. Fracture extends into the greater trochanter region or fracture is comminuted. No other fractures. No dislocation. Joint spaces appear intact.  IMPRESSION: Comminuted intertrochanteric femur fracture on the right with mild varus angulation at the fracture site. No dislocation seen on this single view.   Electronically Signed   By: Lowella Grip M.D.    On: 05/24/2014 15:03   Dg Chest Portable 1 View  05/24/2014   CLINICAL DATA:  Fall 6-8 feet from ladder today. Generalized right pain. Initial encounter  EXAM: PORTABLE CHEST - 1 VIEW  COMPARISON:  CT 01/19/2014  FINDINGS: Mild hyperinflation/COPD. Lungs are clear. No pneumothorax or effusion. Heart is normal size. No visible rib fracture or other acute bony abnormality.  IMPRESSION: COPD.  No active disease.   Electronically Signed   By: Rolm Baptise M.D.   On: 05/24/2014 15:03    My personal review of EKG: Rhythm NSR, Rate  87 /min, no Acute ST changes    Assessment & Plan   1. [place] Fall - R femur and R wrist fracture - to OR by Dr Erlinda Hong  Cardio-Pulm Risk stratification for surgery and recommendations to minimize the same:-  A.Cardio-Pulmonary Risk -  this patient is a low to moderate for adverse Cardio-Pulmonary  Outcome  from surgery, the risks and benefits were discussed and acceptable to the patient and son  Recommendations for optimizing Cardio-Pulmonary  Risk risk factors  1. Keep SBP<140, HR<85, use Lopressor 5mg  IV q4hrs PRN, or B.Blocker drip PRN. 2. Moniotr I&Os. 3. Minimal sedation and Narcotics. 4. Good pulmunary toilet. 5. PRN Nebs and as needed oxygen to keep Pox>90% 6. Hb>8, transfuse as needed- Lasix 10mg  IV after each unit PRBC Transfused.   B.Bleeding Risk - no previous surgical complications, no easy bruising,  Antiplate meds none   Lab Results  Component Value Date   PLT 220 05/24/2014                  Lab Results  Component Value Date   INR 1.23 05/24/2014      Will request Surgeon to please Order DVT prophylaxis of his/her choice, along with activity, weight bearing precautions and diet if appropriate.      2.H/O Seizure - continue Dilantin   3.Smoking - counseled to quit   4.Remote alcohol abuse > 4 yrs ago - monitor.   5.COPD - compensated, oxygen of [place] treatments as needed.   6.Nonspecific mesenteric lymphadenopathy.  Outpatient follow-up with PCP and GI post discharge.Will need repeat CT scan in 3 months.    DVT Prophylaxis Heparin SCDs    AM Labs Ordered, also please review Full Orders  Family Communication: Admission, patients condition and plan of care including tests being ordered have been discussed with the patient and son-daughter who indicate understanding and agree with the plan and Code Status.  Code Status Full  Likely DC to  TBD  Condition Fair  Time spent in minutes : [place]  K M.D on 05/24/2014 at 6:39 PM  Between 7am to 7pm - Pager - [phone]  After 7pm go to [email or web address] - password [email or web address]  And look for the night coverage person covering me after hours  [facility] Office  [phone]

## 2014-05-24 NOTE — ED Provider Notes (Signed)
CSN: [id number]     Arrival date & time 05/24/14  1432 History   First MD Initiated Contact with Patient 05/24/14 1435     Chief Complaint  Patient presents with  . Fall     (Consider location/radiation/quality/duration/timing/severity/associated sxs/prior Treatment) HPI Comments: Pt fell 6-8 feet off a ladder at work onto R hip.  He complains of R hip pain, has R wrist deformity. Eh states he did not hit his head, denies, h/a, neck pain, LOC, chest pain, abdominal pain.   Patient is a 68 y.o. male presenting with fall. The history is provided by the patient. No language interpreter was used.  Fall This is a new problem. The current episode started 1 to 2 hours ago. The problem occurs rarely. The problem has not changed since onset.Pertinent negatives include no chest pain, no abdominal pain, no headaches and no shortness of breath. Associated symptoms comments: R hip pain, R wrist pain. Nothing aggravates the symptoms. Nothing relieves the symptoms. He has tried nothing for the symptoms. The treatment provided no relief.    Past Medical History  Diagnosis Date  . Seizure disorder 2205    ALCOHOL RELATED  . Macular degeneration   . COPD (chronic obstructive pulmonary disease)   . Alcohol abuse   . Vitamin D deficiency    Past Surgical History  Procedure Laterality Date  . Cardiac catheterization  2002    normal   Family History  Problem Relation Age of Onset  . Heart attack Father    History  Substance Use Topics  . Smoking status: Former Smoker    Quit date: 05/14/2011  . Smokeless tobacco: Not on file  . Alcohol Use: Not on file    Review of Systems  Constitutional: Negative for fever, activity change, appetite change and fatigue.  HENT: Negative for congestion, facial swelling, rhinorrhea and trouble swallowing.   Eyes: Negative for photophobia and pain.  Respiratory: Negative for cough, chest tightness and shortness of breath.   Cardiovascular: Negative for chest  pain and leg swelling.  Gastrointestinal: Negative for nausea, vomiting, abdominal pain, diarrhea and constipation.  Endocrine: Negative for polydipsia and polyuria.  Genitourinary: Negative for dysuria, urgency, decreased urine volume and difficulty urinating.  Musculoskeletal: Negative for back pain and gait problem.  Skin: Negative for color change, rash and wound.  Allergic/Immunologic: Negative for immunocompromised state.  Neurological: Negative for dizziness, facial asymmetry, speech difficulty, weakness, numbness and headaches.  Psychiatric/Behavioral: Negative for confusion, decreased concentration and agitation.      Allergies  Review of patient's allergies indicates not on file.  Home Medications   Prior to Admission medications   Medication Sig Start Date End Date Taking? Authorizing Provider  cholecalciferol (VITAMIN D) 1000 UNITS tablet Take 1,000 Units by mouth daily.    Historical Provider, MD  phenytoin (DILANTIN) 100 MG ER capsule Take by mouth 3 (three) times daily.    Historical Provider, MD   BP 148/76 mmHg  Pulse 77  Temp(Src) 98.5 F (36.9 C) (Oral)  Resp 14  Ht 5\' 8"  (1.727 m)  Wt 130 lb (58.968 kg)  BMI 19.77 kg/m2  SpO2 100% Physical Exam  Constitutional: He is oriented to person, place, and time. He appears well-developed and well-nourished. No distress.  HENT:  Head: Normocephalic and atraumatic.  Mouth/Throat: No oropharyngeal exudate.  Eyes: Pupils are equal, round, and reactive to light.  Neck: Normal range of motion. Neck supple.  Cardiovascular: Normal rate, regular rhythm and normal heart sounds.  Exam reveals no  gallop and no friction rub.   No murmur heard. Pulmonary/Chest: Effort normal and breath sounds normal. No respiratory distress. He has no wheezes. He has no rales.  Abdominal: Soft. Bowel sounds are normal. He exhibits no distension and no mass. There is no tenderness. There is no rebound and no guarding.  Musculoskeletal: Normal  range of motion. He exhibits no edema.       Right wrist: He exhibits deformity. He exhibits normal range of motion, no bony tenderness and no swelling.       Right hip: He exhibits tenderness and bony tenderness.       Arms: Neurological: He is alert and oriented to person, place, and time.  Skin: Skin is warm and dry.  Psychiatric: He has a normal mood and affect.    ED Course  Procedures (including critical care time) Labs Review Labs Reviewed  COMPREHENSIVE METABOLIC PANEL  CBC  ETHANOL  PROTIME-INR  I-STAT CHEM 8, ED  SAMPLE TO BLOOD BANK    Imaging Review No results found.   EKG Interpretation None      MDM   Final diagnoses:  Fall from ladder, initial encounter    Pt is a 68 y.o. male with Pmhx as above who presents with right wrist deformity and right posterior hip pain after a fall off a ladder approximately 6-8 feet just prior to arrival. He states that he landed on his wrist and right hip. He did not hit his head. He denies headache, neck pain, chest pain, abdominal pain. He had no loss of consciousness. He has positive tenderness to palpation of the right hip and deformity of the right wrist, however he is neurovascularly intact and has a bounding radial pulse. Chest x-ray and x-ray pelvis ordered. X-ray pelvis shows comminuted intertrochanteric femur fracture on the right with mild varus angulation at the fracture site. Given mechanism, CT chest and abdomen pelvis up in order. I spoke with Dr. Erlinda Hong with orthopedics who has requested 3-D recon of right hip which has been ordered. X-ray of right wrist shows a comminuted impacted distal right radial fracture as well as ulnar styloid fracture. Dr. Erlinda Hong will come to the ED to see the patient.        Ernestina Patches, MD 05/24/14 2053

## 2014-05-24 NOTE — Op Note (Signed)
Date of Surgery: 05/24/2014  INDICATIONS: Joshua Reed is a 68 y.o.-year-old male who was involved in a mechanical fall and sustained a right hip fracture (pertrochanteric-type). The risks and benefits of the procedure discussed with the patient and family prior to the procedure and all questions were answered; consent was obtained.  PREOPERATIVE DIAGNOSIS: right hip fracture (intertrochanteric-type)   POSTOPERATIVE DIAGNOSIS: Same   PROCEDURE: Treatment of intertrochanteric fracture with intramedullary implant. CPT [id number]   SURGEON: N. Eduard Reed, M.D.   ANESTHESIA: general   IV FLUIDS AND URINE: See anesthesia record   ESTIMATED BLOOD LOSS: 200 cc  IMPLANTS: Biomet Affixus 11 x 38 mm, 105 lag screw  DRAINS: None.   COMPLICATIONS: None.   DESCRIPTION OF PROCEDURE: The patient was brought to the operating room and placed supine on the operating table. The patient's leg had been signed prior to the procedure. The patient had the anesthesia placed by the anesthesiologist. The prep verification and incision time-outs were performed to confirm that this was the correct patient, site, side and location. The patient had an SCD on the opposite lower extremity. The patient did receive antibiotics prior to the incision and was re-dosed during the procedure as needed at indicated intervals. The patient was positioned on the fracture table with the table in traction and internal rotation to reduce the hip. The well leg was placed in a scissor position and all bony prominences were well-padded. The patient had the lower extremity prepped and draped in the standard surgical fashion. The incision was made 4 finger breadths superior to the greater trochanter. A guide pin was inserted into the tip of the greater trochanter under fluoroscopic guidance. An opening reamer was used to gain access to the femoral canal. The nail length was measured and inserted down the femoral canal to its proper depth. The  appropriate version of insertion for the lag screw was found under fluoroscopy. A pin was inserted up the femoral neck through the jig. Then, a second antirotation pin was inserted superior to the first pin. The length of the lag screw was then measured. The lag screw was inserted as near to center-center in the head as possible. The antirotation pin was then taken out. The leg was taken out of traction, then the interdigitating compression screw was used to compress across the fracture. Compression was visualized on serial xrays. The wound was copiously irrigated with saline and the subcutaneous layer closed with 2.0 vicryl and the skin was reapproximated with staples. The wounds were cleaned and dried a final time and a sterile dressing was placed. The hip was taken through a range of motion at the end of the case under fluoroscopic imaging to visualize the approach-withdraw phenomenon and confirm implant length in the head. The patient was then awakened from anesthesia and taken to the recovery room in stable condition. All counts were correct at the end of the case.   POSTOPERATIVE PLAN: The patient will be weight bearing as tolerated and will return in 2 weeks for staple removal and the patient will receive DVT prophylaxis based on other medications, activity level, and risk ratio of bleeding to thrombosis.   Joshua Cecil, Joshua Reed [facility] [phone] 10:07 PM      DATE OF SURGERY: 05/24/2014  PREOPERATIVE DIAGNOSIS: Right Wrist Fracture  POSTOPERATIVE DIAGNOSIS: same  PROCEDURE: Open reduction and internal fixation, Right distal radius. CPT [phone] 3 or more fragments  IMPLANT:  Implant Name Type Inv. Item Serial No. Manufacturer Lot No. LRB  No. Used  LOG [id number] -  BIOMET DVR HAND INNOVATIONS SET - 1         LOG [id number] -  BIOMET/DEPUY ACE LOCKING SMALL FRAGMENT SET - 1         NAIL HIP FRACTURE 11X380MM - [id number] Nail NAIL HIP FRACTURE 11X380MM  BIOMET TRAUMA DP [id number] Right 1   HIP FR NAIL LAG SCREW 10.5X105 - [id number] Screw HIP FR NAIL LAG SCREW 10.5X105  BIOMET TRAUMA DP [id number] Right 1  SCREW LOCKING 2.7X16 - [id number] Screw SCREW LOCKING 2.7X16  BIOMET TRAUMA DP  Right 4  PLATE DVR CROSSLOCK STD RT - [id number] Plate PLATE DVR CROSSLOCK STD RT  BIOMET TRAUMA DP  Right 1  SCREW NONLOCK 2.7X26MM - [id number] Screw SCREW NONLOCK 2.7X26MM  BIOMET TRAUMA DP  Right 2  SCREW LOCKING 2.7X22MM - [id number] Screw SCREW LOCKING 2.7X22MM  BIOMET TRAUMA DP  Right 3  SCREW LOCKING 2.7X24MM - [id number] Screw SCREW LOCKING 2.7X24MM   BIOMET TRAUMA DP   Right 3     SURGEON: Surgeon(s) and Role: Panel 1:    * Joshua Sowell Eduard Roux, Joshua Reed - Primary  ANESTHESIA: General  TOURNIQUET TIME:  Total Tourniquet Time Documented: Forearm (Right) - 56 minutes Total: Forearm (Right) - 56 minutes   BLOOD LOSS: Minimal.  COMPLICATIONS: None.  PATHOLOGY: None.  TIME OUT: Performed before the start of procedure.  INDICATIONS: The patient is a 68 y.o. male who presented with a displaced distal radius fracture indicated for osteosynthesis.  DESCRIPTION OF PROCEDURE:  The patient was identified in the preoperative holding area.  The operative site was marked by the surgeon and confirmed by the patient.  He was brought back to the operating room.  Anesthesia was induced by the anesthesia team.  A well padded nonsterile tourniquet was placed. The operative extremity was prepped and draped in standard sterile fashion.  A timeout was performed.  Preoperative antibiotics were given. The extremity was exsanguinated, tourniquet inflated to 250 mmHg.  A FCR approach was used.  Dissection through the sheath of FCR was carried out and the FCR tendon was retracted. The floor of FCR sheath was released. Flexor tendons and median nerve were retracted ulnarly and protected. Pronator quadratus was released from distal radius. Fracture lines were visualized. Fracture was reduced under fluoroscopy. A volar  locking plate was applied to the volar surface of the distal radius. Eight distal locking screws and 3 shaft  screws were inserted. Fluoroscopy was used to show adequate reduction. The tourniquet was deflated. Hemostasis was achieved. The wound was irrigated. Incision closed in layers. Sterile dressing applied. Wrist immobilized in a removable brace. The patient was transferred to the recovery room in stable condition after all counts were correct.  POSTOPERATIVE PLAN: The patient will remain in the brace until instructed. Sutures will be removed in two weeks. About four weeks after surgery, will start wrist range of motion exercises, but in the meantime before then she will start aggressive finger range of motion exercises without resistance.  The "six pack of hand exercises" handout was given to the patient.

## 2014-05-24 NOTE — ED Provider Notes (Signed)
I have reassessed the patient and find that he has no complaints of headache/ cervical spine pain, intrathoracic or intra-abdominal pain. He denies injury to these areas.  I have examined the repalpated the chest and find no abrasions or crepitus. I have repeated palpation of the abdomen and find it nontender. The patient's mental status is clear. He has no respiratory distress.  CT results were reviewed by radiology and called to myself for no intrathoracic or intra-abdominal injuries.  I have reviewed the case with Dr. Hulen Skains, at this point with stable vital signs and no findings of other acute injury except for the orthopedic injuries identified, he reports the patient is okay for medical admission and orthopedic interventional surgeries.  Charlesetta Shanks, MD 05/24/14 [phone]

## 2014-05-24 NOTE — Consult Note (Signed)
ORTHOPAEDIC CONSULTATION  REQUESTING PHYSICIAN: Charlesetta Shanks, MD  Chief Complaint: fall  HPI: Joshua Reed is a 68 y.o. male who complains of right wrist and hip injury s/p mechanical fall from 6-8 ft from ladder while working.  Denies LOC, neck pain, abd pain.  Denies any events prior to fall.  Ortho consulted for orthopedic injuries.  Past Medical History  Diagnosis Date  . Seizure disorder 2205    ALCOHOL RELATED  . Macular degeneration   . COPD (chronic obstructive pulmonary disease)   . Alcohol abuse   . Vitamin D deficiency    Past Surgical History  Procedure Laterality Date  . Cardiac catheterization  2002    normal   History   Social History  . Marital Status: Married    Spouse Name: N/A    Number of Children: N/A  . Years of Education: N/A   Social History Main Topics  . Smoking status: Current Every Day Smoker    Types: Cigarettes    Last Attempt to Quit: 05/14/2011  . Smokeless tobacco: None  . Alcohol Use: No     Comment: quit 2-3  years ago  . Drug Use: None  . Sexual Activity: None   Other Topics Concern  . None   Social History Narrative   Family History  Problem Relation Age of Onset  . Heart attack Father    No Known Allergies Prior to Admission medications   Medication Sig Start Date End Date Taking? Authorizing Provider  phenytoin (DILANTIN) 100 MG ER capsule Take 500 mg by mouth every morning.    Yes Historical Provider, MD  cholecalciferol (VITAMIN D) 1000 UNITS tablet Take 1,000 Units by mouth daily.    Historical Provider, MD   Dg Wrist 2 Views Right  05/24/2014   CLINICAL DATA:  [place] off ladder today from 10 feet. Deformity of right wrist. Right wrist pain. Initial encounter.  EXAM: RIGHT WRIST - 2 VIEW  COMPARISON:  None.  FINDINGS: Study is limited due to patient condition and limited positioning. Comminuted fracture noted through the distal right radius with impaction. No definite dislocation. Ulnar styloid fracture.  Diffuse soft tissue swelling noted.  IMPRESSION: Comminuted, impacted distal right radial fracture. Distal ulnar/ulnar styloid fracture.   Electronically Signed   By: Rolm Baptise M.D.   On: 05/24/2014 15:49   Dg Pelvis Portable  05/24/2014   CLINICAL DATA:  Patient fell 8 feet off a ladder with right hip region pain  EXAM: PORTABLE PELVIS 1-2 VIEWS  COMPARISON:  None.  FINDINGS: There is an intertrochanteric femur fracture on the right with mild varus angulation at the fracture site. Fracture extends into the greater trochanter region or fracture is comminuted. No other fractures. No dislocation. Joint spaces appear intact.  IMPRESSION: Comminuted intertrochanteric femur fracture on the right with mild varus angulation at the fracture site. No dislocation seen on this single view.   Electronically Signed   By: Lowella Grip M.D.   On: 05/24/2014 15:03   Dg Chest Portable 1 View  05/24/2014   CLINICAL DATA:  Fall 6-8 feet from ladder today. Generalized right pain. Initial encounter  EXAM: PORTABLE CHEST - 1 VIEW  COMPARISON:  CT 01/19/2014  FINDINGS: Mild hyperinflation/COPD. Lungs are clear. No pneumothorax or effusion. Heart is normal size. No visible rib fracture or other acute bony abnormality.  IMPRESSION: COPD.  No active disease.   Electronically Signed   By: Rolm Baptise M.D.   On: 05/24/2014 15:03  Positive ROS: All other systems have been reviewed and were otherwise negative with the exception of those mentioned in the HPI and as above.  Physical Exam: General: Alert, no acute distress Cardiovascular: No pedal edema Respiratory: No cyanosis, no use of accessory musculature GI: No organomegaly, abdomen is soft and non-tender Skin: No lesions in the area of chief complaint Neurologic: Sensation intact distally Psychiatric: Patient is competent for consent with normal mood and affect Lymphatic: No axillary or cervical lymphadenopathy  MUSCULOSKELETAL:  - gross deformity of right  wrist with superficial abrasion on volar wrist, no evidence of open fx - hand wwp, normal CR, 2+ radial pulse, no signs of acute CTS  - painful ROM of hip - skin intact - foot wwp, NVI  Assessment: 1. Right distal radius fx 2. Right IT hip fx  Plan: - right wrist manipulated and splinted in ER - needs surgical treatment of both injuries - discussed r/b/a to surgery, they wish to proceed - will admit to trauma vs. hospitalist - awaiting CT C/A/P  - continue NPO  Thank you for the consult and the opportunity to see [name]. Eduard Roux, MD [place] 5:24 PM

## 2014-05-24 NOTE — ED Notes (Signed)
Dr. Naaman Plummer at the bedside , updating pt. On plan of care

## 2014-05-24 NOTE — Anesthesia Preprocedure Evaluation (Addendum)
Anesthesia Evaluation  Patient identified by MRN, date of birth, ID band Patient awake    Reviewed: Allergy & Precautions, H&P , NPO status , Patient's Chart, lab work & pertinent test results  Airway Mallampati: II  TM Distance: >3 FB Neck ROM: Limited    Dental  (+) Partial Upper, Dental Advisory Given   Pulmonary COPDCurrent Smoker,  breath sounds clear to auscultation        Cardiovascular negative cardio ROS  Rhythm:Regular Rate:Normal     Neuro/Psych Seizures -, Well Controlled,  negative psych ROS   GI/Hepatic negative GI ROS, (+)     substance abuse  alcohol use,   Endo/Other  negative endocrine ROS  Renal/GU negative Renal ROS     Musculoskeletal negative musculoskeletal ROS (+)   Abdominal   Peds  Hematology negative hematology ROS (+)   Anesthesia Other Findings   Reproductive/Obstetrics                            Anesthesia Physical Anesthesia Plan  ASA: II  Anesthesia Plan: General   Post-op Pain Management:    Induction: Intravenous, Rapid sequence and Cricoid pressure planned  Airway Management Planned: Oral ETT  Additional Equipment:   Intra-op Plan:   Post-operative Plan: Extubation in OR  Informed Consent: I have reviewed the patients History and Physical, chart, labs and discussed the procedure including the risks, benefits and alternatives for the proposed anesthesia with the patient or authorized representative who has indicated his/her understanding and acceptance.   Dental advisory given  Plan Discussed with: CRNA and Surgeon  Anesthesia Plan Comments:        Anesthesia Quick Evaluation

## 2014-05-24 NOTE — ED Notes (Addendum)
Dr. Erlinda Hong and orthotech resetting left wrist, wrapping and splinting.  Applied dressing to small abrasion on left wrist.

## 2014-05-24 NOTE — Progress Notes (Signed)
Orthopedic Tech Progress Note Patient Details:  Joshua Reed 1946/05/22 [id number]  Ortho Devices Type of Ortho Device: Ace wrap, Sugartong splint Ortho Device/Splint Location: RUE Ortho Device/Splint Interventions: Ordered, Application   Braulio Bosch 05/24/2014, 5:29 PM

## 2014-05-24 NOTE — ED Notes (Signed)
Patient transported to CT 

## 2014-05-24 NOTE — ED Notes (Signed)
Pt arrived to holding room 36 with this RN.

## 2014-05-24 NOTE — ED Notes (Signed)
Pt returned from xray, Dr. Candiss Norse at the bedside, EKG completed for MD.

## 2014-05-24 NOTE — Anesthesia Postprocedure Evaluation (Signed)
  Anesthesia Post-op Note  Patient: Joshua Reed  Procedure(s) Performed: Procedure(s): OPEN REDUCTION INTERNAL FIXATION (ORIF) DISTAL RADIAL FRACTURE (Right) INTRAMEDULLARY (IM) NAIL INTERTROCHANTRIC (Right)  Patient Location: PACU  Anesthesia Type:General  Level of Consciousness: awake, alert  and oriented  Airway and Oxygen Therapy: Patient Spontanous Breathing  Post-op Pain: none  Post-op Assessment: Post-op Vital signs reviewed  Post-op Vital Signs: Reviewed  Last Vitals:  Filed Vitals:   05/24/14 2300  BP: 118/68  Pulse: 95  Temp: 36.6 C  Resp: 14    Complications: No apparent anesthesia complications

## 2014-05-24 NOTE — ED Notes (Signed)
Pt. Received 250 mch Fentanyl en route.

## 2014-05-24 NOTE — ED Notes (Signed)
Pt. 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  Pt. Remembers the incident, pt. Broke his fall with his rt. Wrist and has deformity noted to his rt. Wrist, +cns to his rt. Wrist.  Pt. Also having rt. Posterior pelvic pain.  Rt. Leg has lateral rotation without shortening, able to move his toes.  Pt. Denies any loc. Pt. Denies any n/v/d   Does have pain to his pelvis and wrist.  Pt. Is alert and oriented X 4. Skin is warm and dry and pink

## 2014-05-24 NOTE — Progress Notes (Signed)
Orthopedic Tech Progress Note Patient Details:  Joshua Reed 11/10/1945 [id number] Ordered by Dr. Erlinda Hong Patient ID: [name], male   DOB: 1945/09/22, 68 y.o.   MRN: [id number]   Braulio Bosch 05/24/2014, 5:30 PM

## 2014-05-24 NOTE — Anesthesia Procedure Notes (Signed)
Procedure Name: Intubation Date/Time: 05/24/2014 7:22 PM Performed by: Claris Che Pre-anesthesia Checklist: Patient identified, Emergency Drugs available, Suction available, Patient being monitored and Timeout performed Patient Re-evaluated:Patient Re-evaluated prior to inductionOxygen Delivery Method: Circle system utilized Preoxygenation: Pre-oxygenation with 100% oxygen Intubation Type: IV induction, Rapid sequence and Cricoid Pressure applied Ventilation: Mask ventilation without difficulty Laryngoscope Size: Mac and 3 Grade View: Grade II Tube type: Oral Tube size: 7.5 mm Number of attempts: 1 Airway Equipment and Method: Stylet and LTA kit utilized Placement Confirmation: ETT inserted through vocal cords under direct vision,  positive ETCO2 and breath sounds checked- equal and bilateral Secured at: 23 cm Tube secured with: Tape Dental Injury: Teeth and Oropharynx as per pre-operative assessment

## 2014-05-24 NOTE — Transfer of Care (Signed)
Immediate Anesthesia Transfer of Care Note  Patient: Joshua Reed  Procedure(s) Performed: Procedure(s): OPEN REDUCTION INTERNAL FIXATION (ORIF) DISTAL RADIAL FRACTURE (Right) INTRAMEDULLARY (IM) NAIL INTERTROCHANTRIC (Right)  Patient Location: PACU  Anesthesia Type:General  Level of Consciousness: oriented, sedated, patient cooperative and responds to stimulation  Airway & Oxygen Therapy: Patient Spontanous Breathing and Patient connected to nasal cannula oxygen  Post-op Assessment: Report given to PACU RN, Post -op Vital signs reviewed and stable and Patient moving all extremities X 4  Post vital signs: Reviewed and stable  Complications: No apparent anesthesia complications

## 2014-05-24 NOTE — ED Notes (Signed)
Family at bedside. 

## 2014-05-25 ENCOUNTER — Encounter (HOSPITAL_COMMUNITY): Payer: Self-pay | Admitting: Orthopaedic Surgery

## 2014-05-25 DIAGNOSIS — S7291XA Unspecified fracture of right femur, initial encounter for closed fracture: Secondary | ICD-10-CM

## 2014-05-25 DIAGNOSIS — E440 Moderate protein-calorie malnutrition: Secondary | ICD-10-CM | POA: Insufficient documentation

## 2014-05-25 LAB — BASIC METABOLIC PANEL
Anion gap: 10 (ref 5–15)
BUN: 10 mg/dL (ref 6–23)
CHLORIDE: 99 meq/L (ref 96–112)
CO2: 26 mEq/L (ref 19–32)
Calcium: 7.7 mg/dL — ABNORMAL LOW (ref 8.4–10.5)
Creatinine, Ser: 0.67 mg/dL (ref 0.50–1.35)
GFR calc non Af Amer: >90 mL/min (ref >90)
Glucose, Bld: 180 mg/dL — ABNORMAL HIGH (ref 70–99)
POTASSIUM: 4.7 meq/L (ref 3.7–5.3)
Sodium: 135 mEq/L — ABNORMAL LOW (ref 137–147)

## 2014-05-25 LAB — CBC
HEMATOCRIT: 28.2 % — AB (ref 39.0–52.0)
HEMOGLOBIN: 9.2 g/dL — AB (ref 13.0–17.0)
MCH: 28.3 pg (ref 26.0–34.0)
MCHC: 32.6 g/dL (ref 30.0–36.0)
MCV: 86.8 fL (ref 78.0–100.0)
Platelets: 227 10*3/uL (ref 150–400)
RBC: 3.25 MIL/uL — ABNORMAL LOW (ref 4.22–5.81)
RDW: 15.9 % — ABNORMAL HIGH (ref 11.5–15.5)
WBC: 8.7 10*3/uL (ref 4.0–10.5)

## 2014-05-25 LAB — PROTIME-INR
INR: 1.31 (ref 0.00–1.49)
Prothrombin Time: 16.4 seconds — ABNORMAL HIGH (ref 11.6–15.2)

## 2014-05-25 MED ORDER — PHENYTOIN SODIUM EXTENDED 100 MG PO CAPS
200.0000 mg | ORAL_CAPSULE | Freq: Every day | ORAL | Status: DC
  Administered 2014-05-25 – 2014-05-26 (×2): 200 mg via ORAL
  Filled 2014-05-25 (×3): qty 2

## 2014-05-25 MED ORDER — NICOTINE 21 MG/24HR TD PT24
21.0000 mg | MEDICATED_PATCH | Freq: Every day | TRANSDERMAL | Status: DC
  Administered 2014-05-25 – 2014-05-29 (×5): 21 mg via TRANSDERMAL
  Filled 2014-05-25 (×5): qty 1

## 2014-05-25 MED ORDER — INFLUENZA VAC SPLIT QUAD 0.5 ML IM SUSY
0.5000 mL | PREFILLED_SYRINGE | INTRAMUSCULAR | Status: DC
  Filled 2014-05-25: qty 0.5

## 2014-05-25 MED ORDER — MORPHINE SULFATE 2 MG/ML IJ SOLN
1.0000 mg | INTRAMUSCULAR | Status: AC
  Administered 2014-05-25: 1 mg via INTRAVENOUS

## 2014-05-25 MED ORDER — PHENYTOIN SODIUM EXTENDED 100 MG PO CAPS
300.0000 mg | ORAL_CAPSULE | Freq: Every day | ORAL | Status: DC
  Administered 2014-05-25: 300 mg via ORAL
  Filled 2014-05-25 (×2): qty 3

## 2014-05-25 MED ORDER — BOOST PLUS PO LIQD
237.0000 mL | Freq: Two times a day (BID) | ORAL | Status: DC
  Administered 2014-05-26 – 2014-05-29 (×6): 237 mL via ORAL
  Filled 2014-05-25 (×11): qty 237

## 2014-05-25 NOTE — Progress Notes (Signed)
Pt unable to void spontaneously after in and out cath at 0430. Bladder scan performed and 664ml of urine found. In and out cath( #2) performed. 75ml of urine returned. Patient due to void in 6-8 hours. Will continue to monitor for output.

## 2014-05-25 NOTE — Progress Notes (Signed)
INITIAL NUTRITION ASSESSMENT  Pt meets criteria for NON-SEVERE (MODERATE) MALNUTRITION in the context of chronic illness as evidenced by moderate fat mass loss and moderate to severe muscle mass loss.  DOCUMENTATION CODES Per approved criteria  -Non-severe (moderate) malnutrition in the context of chronic illness   INTERVENTION: Provide Boost Plus po BID, each supplement provides 360 kcal and 14 grams of protein.  Encourage adequate PO intake.  NUTRITION DIAGNOSIS: Increased nutrient needs related to s/p surgery as evidenced by estimated nutrition needs.   Goal: Pt to meet >/= 90% of their estimated nutrition needs   Monitor:  PO intake, weight trends, labs, I/O's  Reason for Assessment: MST  68 y.o. male  Admitting Dx: Femur fracture, right  ASSESSMENT: Pt with H/O seizures last 54yr ago, COPD, smoking, remote H/O alcohol abuse, works as an Clinical biochemist, was at work and climbing a ladder-slipped fell on concrete, hit his R side, had ++ pain came to the ER found to have R femur and wrist fracture.  PROCEDURE (11/4): OPEN REDUCTION INTERNAL FIXATION (ORIF) DISTAL RADIAL FRACTURE (Right) INTRAMEDULLARY (IM) NAIL INTERTROCHANTRIC (Right)  Pt reports having a good appetite currently and PTA at home. He reports he ate 100% of his breakfast this morning. Wife reports pt has been eating less at meals over the past couple of years, however pt has still been eating 3 meals a day. She reports pt has also been drinking Boost once a day and would like some ordered for the pt. Will order. Wife reports the pt usually weighs around 127 lbs and says current weight may be inaccurate. Question weight accuracy? Pt was encouraged to eat his food at meals and to drink his supplements.  Nutrition Focused Physical Exam:  Subcutaneous Fat:  Orbital Region: N/A Upper Arm Region: Moderate depletion Thoracic and Lumbar Region: Moderate depletion  Muscle:  Temple Region: N/A Clavicle Bone Region:  Severe depletion Clavicle and Acromion Bone Region: Severe depletion Scapular Bone Region: N/A Dorsal Hand: N/A Patellar Region: WNL Anterior Thigh Region: Moderate depletion Posterior Calf Region: WNL  Edema: none  Labs: Low sodium and calcium.  Height: Ht Readings from Last 1 Encounters:  05/25/14 5\' 7"  (1.702 m)    Weight: Wt Readings from Last 1 Encounters:  05/25/14 150 lb 9.2 oz (68.3 kg)    Ideal Body Weight: 148 lbs  % Ideal Body Weight: 101%  Wt Readings from Last 10 Encounters:  05/25/14 150 lb 9.2 oz (68.3 kg)    Usual Body Weight: 127 lbs  % Usual Body Weight: 118%  BMI:  Body mass index is 23.58 kg/(m^2).  Estimated Nutritional Needs: Kcal: 1900-2100 Protein: 80-95 grams Fluid: 1.9 - 2.1 L/day  Skin: incision on hip and right arm  Diet Order: Diet regular  EDUCATION NEEDS: -No education needs identified at this time   Intake/Output Summary (Last 24 hours) at 05/25/14 1223 Last data filed at 05/25/14 0944  Gross per 24 hour  Intake 3026.25 ml  Output   1800 ml  Net 1226.25 ml    Last BM: 11/4  Labs:   Recent Labs Lab 05/24/14 1614 05/24/14 1624 05/25/14 0025  NA 136* 135* 135*  K 4.8 4.4 4.7  CL 101 100 99  CO2 23  --  26  BUN 12 11 10   CREATININE 0.77 0.80 0.67  CALCIUM 7.8*  --  7.7*  GLUCOSE 98 100* 180*    CBG (last 3)  No results for input(s): GLUCAP in the last 72 hours.  Scheduled Meds: .  ceFAZolin (ANCEF) IV  2 g Intravenous Q6H  . cholecalciferol  1,000 Units Oral Daily  . enoxaparin (LOVENOX) injection  40 mg Subcutaneous Q24H  . [START ON 05/26/2014] Influenza vac split quadrivalent PF  0.5 mL Intramuscular Tomorrow-1000  . nicotine  21 mg Transdermal Daily  . phenytoin  200 mg Oral Daily   And  . phenytoin  300 mg Oral QHS  . senna  1 tablet Oral BID  . tiotropium  18 mcg Inhalation Daily    Continuous Infusions:   Past Medical History  Diagnosis Date  . Seizure disorder 2205    ALCOHOL RELATED   . Macular degeneration   . COPD (chronic obstructive pulmonary disease)   . Alcohol abuse   . Vitamin D deficiency     Past Surgical History  Procedure Laterality Date  . Cardiac catheterization  2002    normal  . Open reduction internal fixation (orif) distal radial fracture Right 05/24/2014    Procedure: OPEN REDUCTION INTERNAL FIXATION (ORIF) DISTAL RADIAL FRACTURE;  Surgeon: Marianna Payment, MD;  Location: [place];  Service: Orthopedics;  Laterality: Right;  . Intramedullary (im) nail intertrochanteric Right 05/24/2014    Procedure: INTRAMEDULLARY (IM) NAIL INTERTROCHANTRIC;  Surgeon: Marianna Payment, MD;  Location: [place];  Service: Orthopedics;  Laterality: Right;    Kallie Locks, MS, RD, LDN Pager # [phone] After hours/ weekend pager # [phone]

## 2014-05-25 NOTE — Progress Notes (Signed)
   Subjective:  Patient reports pain as moderate.  No events.  Objective:   VITALS:   Filed Vitals:   05/25/14 0121 05/25/14 0239 05/25/14 0334 05/25/14 0654  BP: 101/60 140/56 103/60 99/60  Pulse: 84 95 92 84  Temp: 97.9 F (36.6 C) 98.2 F (36.8 C) 98.7 F (37.1 C) 98.2 F (36.8 C)  TempSrc: Oral Oral Oral Oral  Resp: 16 16 16 16   Height:    5\' 7"  (1.702 m)  Weight:    68.3 kg (150 lb 9.2 oz)  SpO2: 100% 100% 100% 100%    Neurologically intact Neurovascular intact Sensation intact distally Intact pulses distally Dorsiflexion/Plantar flexion intact Incision: dressing C/D/I and no drainage No cellulitis present Compartment soft   Lab Results  Component Value Date   WBC 8.7 05/25/2014   HGB 9.2* 05/25/2014   HCT 28.2* 05/25/2014   MCV 86.8 05/25/2014   PLT 227 05/25/2014     Assessment/Plan:  1 Day Post-Op   - Expected postop acute blood loss anemia - will monitor for symptoms - Up with PT/OT - DVT ppx - SCDs, ambulation, lovenox - WBAT RLE - PFWB RUE  Marianna Payment 05/25/2014, 7:56 AM [phone]

## 2014-05-25 NOTE — Evaluation (Signed)
Physical Therapy Evaluation Patient Details Name: Joshua Reed MRN: [id number] DOB: 08-Oct-1945 Today's Date: 05/25/2014   History of Present Illness  s/p fall off of ladder at work resulting in R hip fx and R radial fx s/p R hip IM nailing and R ORIF to radius; WBAT on RLE and WB through platform on RUE  Clinical Impression  Pt presents with the below listed impairments and will benefit from skilled PT intervention to address these impairments and increased functional independence with mobility. Pain is a limiting factor at this time for mobility. Recommending CIR consult as pt was very active PTA and anticipate pt will be able to progress once pain is more managed to increase functional independence. Pt's wife is very anxious about him returning home too soon as she does not feel comfortable and is afraid of him falling.      Follow Up Recommendations CIR;Supervision for mobility/OOB    Equipment Recommendations  Other (comment);Wheelchair (measurements PT);Wheelchair cushion (measurements PT) (R PFRW)    Recommendations for Other Services Rehab consult     Precautions / Restrictions Precautions Precautions: Fall Restrictions Weight Bearing Restrictions: Yes RUE Weight Bearing: Weight bear through elbow only (using PFRW) RLE Weight Bearing: Weight bearing as tolerated      Mobility  Bed Mobility Overal bed mobility: Needs Assistance Bed Mobility: Supine to Sit     Supine to sit: Mod assist     General bed mobility comments: Pt requires A for RLE management as well as truncal assist due to pain and limited use of RUE  Transfers Overall transfer level: Needs assistance Equipment used: None Transfers: Stand Pivot Transfers   Stand pivot transfers: Mod assist       General transfer comment: Performed stand pivot transfer using therapist for support from bed to recliner with cues for technique and hand placement. Therapist found parts to make [place] but did not trial at  end of session due to pt's pain level. Recommend for next therapist to attempt (left in the room) and anticipate this will make transfers easier for the patient.  Ambulation/Gait             General Gait Details: not able to assess on eval  Stairs            Wheelchair Mobility    Modified Rankin (Stroke Patients Only)       Balance Overall balance assessment: Needs assistance   Sitting balance-Leahy Scale: Poor Sitting balance - Comments: requires use of LUE for support due to pain on R side of body     Standing balance-Leahy Scale: Poor                               Pertinent Vitals/Pain Pain Assessment: 0-10 Pain Score: 10-Worst pain ever Pain Location: R arm and RLE Pain Descriptors / Indicators: Aching Pain Intervention(s): Monitored during session;Limited activity within patient's tolerance;RN gave pain meds during session;Repositioned    Home Living Family/patient expects to be discharged to:: Skilled nursing facility Living Arrangements: Spouse/significant other               Additional Comments: Pt's wife is very anxious in regards to d/c planning. She does not feel ready to take care of him and verbalizes fear of him falling. They would like rehab (CIR or SNF) prior to d/c home. Therapist in agreement     Prior Function Level of Independence: Independent  Comments: Pt works for [employer]. Fall occurred at work     Journalist, newspaper        Extremity/Trunk Assessment   Upper Extremity Assessment: Defer to OT evaluation           Lower Extremity Assessment: RLE deficits/detail RLE Deficits / Details: limited due to post op pain and weakness. Pt able to bear weight through RLE during stand pivot transfer       Communication      Cognition Arousal/Alertness: Awake/alert Behavior During Therapy: [facility] for tasks assessed/performed Overall Cognitive Status: Within Functional Limits for tasks assessed                       General Comments General comments (skin integrity, edema, etc.): educated on positioning of RUE and RLE for edema control. Also educated pt on deep breathing technique to slow rate of breathing during mobility and to work through the pain    Exercises        Assessment/Plan    PT Assessment Patient needs continued PT services  PT Diagnosis Difficulty walking;Generalized weakness;Acute pain   PT Problem List Decreased strength;Decreased range of motion;Decreased activity tolerance;Decreased balance;Decreased mobility;Decreased coordination;Decreased knowledge of use of DME;Decreased knowledge of precautions;Pain  PT Treatment Interventions DME instruction;Gait training;Stair training;Functional mobility training;Therapeutic activities;Therapeutic exercise;Balance training;Neuromuscular re-education;Patient/family education;Wheelchair mobility training;Modalities   PT Goals (Current goals can be found in the Care Plan section) Acute Rehab PT Goals Patient Stated Goal: not to be in so much pain PT Goal Formulation: With patient/family Time For Goal Achievement: 06/01/14 Potential to Achieve Goals: Good    Frequency Min 5X/week   Barriers to discharge   pt's wife does not feel comfortable providing level of assist needed at this time.    Co-evaluation               End of Session Equipment Utilized During Treatment: Gait belt Activity Tolerance: Patient limited by pain Patient left: in chair;with call bell/phone within reach;with nursing/sitter in room;with family/visitor present Nurse Communication: Mobility status;Weight bearing status         Time: 1104-1140 PT Time Calculation (min): 36 min   Charges:     PT Treatments $Therapeutic Activity: 23-37 mins   PT G Codes:          Allayne Gitelman 05/25/2014, 12:01 PM

## 2014-05-25 NOTE — Progress Notes (Signed)
Utilization review completed.  

## 2014-05-25 NOTE — Progress Notes (Signed)
Orthopedic Tech Progress Note Patient Details:  Joshua Reed [birth year] [id number]  Patient ID: Joshua Reed, male   DOB: 1946/03/21, 68 y.o.   MRN: [id number] RN stated that pt is unable to use the trapeze bar patient helper  Hildred Priest 05/25/2014, 10:32 AM

## 2014-05-25 NOTE — Progress Notes (Signed)
PATIENT DETAILS Name: Joshua Reed Age: 68 y.o. Sex: male Date of Birth: Sep 11, 1945 Admit Date: 05/24/2014 Admitting Physician Thurnell Lose, MD IWP:[id number] Marigene Ehlers, MD  Subjective: No major issues, except pain at operative site.   Assessment/Plan: Principal Problem:   Hip fracture, right:secondary to a mechanical fall,underwent right hip repair with intramedullary implant on 11/4. Orthopedics following,Lovenox for DVT prophylaxis, weightbearing as tolerated. Suspect will need SNF, social worker aware. Mild perioperative blood loss anemia present today, otherwise no major complications postoperatively.   Active Problems:   Right wrist fracture:secondary to mechanical fall, underwent open reduction and internal fixation on 11/4. Orthopedics following.   Anemia:likely secondary to perioperative blood loss, no indication for PRBC transfusion. Follow Hb    History of seizure disorder: change Dilantin to 200 mg in a.m., 300 mg in p.m. Obtain Dilantin level in a.m.no acute issues at this time. Last seizure 1 year back.   COPD (chronic obstructive pulmonary disease):lungs clear.continue with Spiriva, and prn albuterol.   Remote Alcohol abuse:quit more than 4 years back, alcohol level <11 on admission. No signs of withdrawal.   Tobacco abuse: counseled, start transdermal nicotine.  Disposition: Remain inpatient  Antibiotics:  None  DVT Prophylaxis: Prophylactic Lovenox   Code Status: Full code   Family Communication Spouse at bedside  Procedures:  Right hip/right wrist repair on 11/4  CONSULTS:  orthopedic surgery  Time spent 40 minutes-which includes 50% of the time with face-to-face with patient/ family and coordinating care related to the above assessment and plan.    MEDICATIONS: Scheduled Meds: .  ceFAZolin (ANCEF) IV  2 g Intravenous Q6H  . cholecalciferol  1,000 Units Oral Daily  . enoxaparin (LOVENOX) injection  40 mg Subcutaneous Q24H    . [START ON 05/26/2014] Influenza vac split quadrivalent PF  0.5 mL Intramuscular Tomorrow-1000  . nicotine  21 mg Transdermal Daily  . phenytoin  200 mg Oral Daily   And  . phenytoin  300 mg Oral QHS  . senna  1 tablet Oral BID  . tiotropium  18 mcg Inhalation Daily   Continuous Infusions:  PRN Meds:.acetaminophen **OR** acetaminophen, albuterol, alum & mag hydroxide-simeth, guaiFENesin-dextromethorphan, HYDROcodone-acetaminophen, menthol-cetylpyridinium **OR** phenol, methocarbamol **OR** methocarbamol (ROBAXIN)  IV, metoCLOPramide **OR** metoCLOPramide (REGLAN) injection, metoprolol, morphine injection, ondansetron **OR** ondansetron (ZOFRAN) IV, oxyCODONE, polyethylene glycol, sorbitol  Antibiotics: Anti-infectives    Start     Dose/Rate Route Frequency Ordered Stop   05/25/14 0200  ceFAZolin (ANCEF) IVPB 2 g/50 mL premix     2 g100 mL/hr over 30 Minutes Intravenous Every 6 hours 05/24/14 2339 05/25/14 1959       PHYSICAL EXAM: Vital signs in last 24 hours: Filed Vitals:   05/25/14 0334 05/25/14 0654 05/25/14 0757 05/25/14 1007  BP: 103/60 99/60  108/59  Pulse: 92 84 85 76  Temp: 98.7 F (37.1 C) 98.2 F (36.8 C)  98.3 F (36.8 C)  TempSrc: Oral Oral  Oral  Resp: 16 16 16 16   Height:  5\' 7"  (1.702 m)    Weight:  68.3 kg (150 lb 9.2 oz)    SpO2: 100% 100% 100% 97%    Weight change:  Filed Weights   05/24/14 1438 05/25/14 0654  Weight: 58.968 kg (130 lb) 68.3 kg (150 lb 9.2 oz)   Body mass index is 23.58 kg/(m^2).   Gen Exam: Awake and alert with clear speech.   Neck: Supple, No JVD.   Chest: B/L Clear.   CVS: S1  S2 Regular, no murmurs.  Abdomen: soft, BS +, non tender, non distended.  Extremities: no edema, lower extremities warm to touch. Neurologic: Non Focal.  Skin: No Rash.   Wounds: N/A.    Intake/Output from previous day:  Intake/Output Summary (Last 24 hours) at 05/25/14 1115 Last data filed at 05/25/14 0944  Gross per 24 hour  Intake 3026.25 ml   Output   1800 ml  Net 1226.25 ml     LAB RESULTS: CBC  Recent Labs Lab 05/24/14 1614 05/24/14 1624 05/25/14 0025  WBC 10.8*  --  8.7  HGB 10.4* 11.6* 9.2*  HCT 31.9* 34.0* 28.2*  PLT 220  --  227  MCV 85.1  --  86.8  MCH 27.7  --  28.3  MCHC 32.6  --  32.6  RDW 15.7*  --  15.9*    Chemistries   Recent Labs Lab 05/24/14 1614 05/24/14 1624 05/25/14 0025  NA 136* 135* 135*  K 4.8 4.4 4.7  CL 101 100 99  CO2 23  --  26  GLUCOSE 98 100* 180*  BUN 12 11 10   CREATININE 0.77 0.80 0.67  CALCIUM 7.8*  --  7.7*    CBG: No results for input(s): GLUCAP in the last 168 hours.  GFR Estimated Creatinine Clearance: 83.8 mL/min (by C-G formula based on Cr of 0.67).  Coagulation profile  Recent Labs Lab 05/24/14 1614 05/25/14 0025  INR 1.23 1.31    Cardiac Enzymes No results for input(s): CKMB, TROPONINI, MYOGLOBIN in the last 168 hours.  Invalid input(s): CK  Invalid input(s): POCBNP No results for input(s): DDIMER in the last 72 hours. No results for input(s): HGBA1C in the last 72 hours. No results for input(s): CHOL, HDL, LDLCALC, TRIG, CHOLHDL, LDLDIRECT in the last 72 hours. No results for input(s): TSH, T4TOTAL, T3FREE, THYROIDAB in the last 72 hours.  Invalid input(s): FREET3 No results for input(s): VITAMINB12, FOLATE, FERRITIN, TIBC, IRON, RETICCTPCT in the last 72 hours. No results for input(s): LIPASE, AMYLASE in the last 72 hours.  Urine Studies No results for input(s): UHGB, CRYS in the last 72 hours.  Invalid input(s): UACOL, UAPR, USPG, UPH, UTP, UGL, UKET, UBIL, UNIT, UROB, ULEU, UEPI, UWBC, URBC, UBAC, CAST, UCOM, BILUA  MICROBIOLOGY: No results found for this or any previous visit (from the past 240 hour(s)).  RADIOLOGY STUDIES/RESULTS: Dg Wrist 2 Views Right  05/24/2014   CLINICAL DATA:  Fracture the distal radius and ulna, closed. Initially counter. Fluoroscopy time 16.3 seconds.  EXAM: RIGHT WRIST - 2 VIEW; DG C-ARM 61-120 MIN   COMPARISON:  New 05/24/2014  FINDINGS: The patient has undergone screw plate fixation of the distal radius. Distal ulnar fracture is again noted.  IMPRESSION: Interval ORIF of the distal radius.   Electronically Signed   By: Shon Hale M.D.   On: 05/24/2014 22:48   Dg Wrist 2 Views Right  05/24/2014   CLINICAL DATA:  Post reduction wrist fracture.  EXAM: RIGHT WRIST - 2 VIEW  COMPARISON:  05/24/2014  FINDINGS: Wrist is imaged through splinting material. There are comminuted fractures of the distal radius and ulna. There has been interval reduction with significant improvement in the angulation of the radiocarpal joint. Intercarpal spaces are grossly normal. Nondisplaced carpal fractures would be difficult to exclude but are not identified.  IMPRESSION: 1. Interval reduction of wrist fractures. 2. Significantly improved radiocarpal angulation.   Electronically Signed   By: Shon Hale M.D.   On: 05/24/2014 18:41   Dg  Wrist 2 Views Right  05/24/2014   CLINICAL DATA:  [place] off ladder today from 10 feet. Deformity of right wrist. Right wrist pain. Initial encounter.  EXAM: RIGHT WRIST - 2 VIEW  COMPARISON:  None.  FINDINGS: Study is limited due to patient condition and limited positioning. Comminuted fracture noted through the distal right radius with impaction. No definite dislocation. Ulnar styloid fracture. Diffuse soft tissue swelling noted.  IMPRESSION: Comminuted, impacted distal right radial fracture. Distal ulnar/ulnar styloid fracture.   Electronically Signed   By: Rolm Baptise M.D.   On: 05/24/2014 15:49   Dg Femur Right  05/24/2014   CLINICAL DATA:  Right femoral intertrochanteric fracture.  EXAM: DG C-ARM 61-120 MIN; RIGHT FEMUR - 2 VIEW  COMPARISON:  CT 05/24/2014  FINDINGS: Intraoperative images demonstrate surgical fixation of the intertrochanteric fracture with a dynamic hip screw. Intramedullary nail extends into the distal femur. The right hip is located.  IMPRESSION: Internal fixation of the  right femoral intertrochanteric fracture.   Electronically Signed   By: Markus Daft M.D.   On: 05/24/2014 22:43   Ct Chest W Contrast  05/24/2014   CLINICAL DATA:  Trauma with fall from ladder 6-8 feet. Unable to raise arms above head.  EXAM: CT CHEST, ABDOMEN, AND PELVIS WITH CONTRAST  TECHNIQUE: Multidetector CT imaging of the chest, abdomen and pelvis was performed following the standard protocol during bolus administration of intravenous contrast.  CONTRAST:  71mL OMNIPAQUE IOHEXOL 300 MG/ML  SOLN  COMPARISON:  CT chest 01/19/2014  FINDINGS: CT CHEST FINDINGS  The lungs are well inflated without consolidation or effusion. There is no pneumothorax. There is mild centrilobular emphysematous disease. Airways are within normal. Heart is normal in size. There is minimal calcified plaque over the thoracic aorta. Remaining mediastinal structures are unremarkable.  CT ABDOMEN AND PELVIS FINDINGS  The exam demonstrates a normal spleen, pancreas, gallbladder and adrenal glands. There is minimal stable atrophy over the anterior right lobe of the liver as a liver is otherwise unremarkable. Kidneys are within normal. There is calcified plaque over the abdominal aorta. There is no free fluid or free peritoneal air. Appendix is not seen. There is a slight increased number of small mesenteric lymph nodes. There is a 1.2 cm oval low-density focus within the mesentery of the mid abdomen left of midline just below the aortic bifurcation which may represent a small mesenteric cyst or low-density lymph node.  Pelvic images demonstrate mild bladder distention. Prostate and rectum are within normal. No free fluid.  There is a displaced intertrochanteric fracture of the right hip. There is a subtle nondisplaced fracture of the right inferior pubic ramus. Remaining bones soft tissues are unremarkable.  IMPRESSION: Displaced right intertrochanteric hip fracture. Nondisplaced fracture of the right inferior pubic ramus.  No acute  findings within the chest. No acute intra-abdominal findings.  Minimal nonspecific increased number of small mesenteric lymph nodes. Recommend followup abdominal pelvic CT 3 months.  Minimal centrilobular emphysematous disease.  These results were called by telephone at the time of interpretation on 05/24/2014 at 5:28 pm to Dr. Ermalinda Memos, who verbally acknowledged these results.   Electronically Signed   By: Marin Olp M.D.   On: 05/24/2014 17:28   Ct Abdomen Pelvis W Contrast  05/24/2014   CLINICAL DATA:  Trauma with fall from ladder 6-8 feet. Unable to raise arms above head.  EXAM: CT CHEST, ABDOMEN, AND PELVIS WITH CONTRAST  TECHNIQUE: Multidetector CT imaging of the chest, abdomen and pelvis was performed following  the standard protocol during bolus administration of intravenous contrast.  CONTRAST:  51mL OMNIPAQUE IOHEXOL 300 MG/ML  SOLN  COMPARISON:  CT chest 01/19/2014  FINDINGS: CT CHEST FINDINGS  The lungs are well inflated without consolidation or effusion. There is no pneumothorax. There is mild centrilobular emphysematous disease. Airways are within normal. Heart is normal in size. There is minimal calcified plaque over the thoracic aorta. Remaining mediastinal structures are unremarkable.  CT ABDOMEN AND PELVIS FINDINGS  The exam demonstrates a normal spleen, pancreas, gallbladder and adrenal glands. There is minimal stable atrophy over the anterior right lobe of the liver as a liver is otherwise unremarkable. Kidneys are within normal. There is calcified plaque over the abdominal aorta. There is no free fluid or free peritoneal air. Appendix is not seen. There is a slight increased number of small mesenteric lymph nodes. There is a 1.2 cm oval low-density focus within the mesentery of the mid abdomen left of midline just below the aortic bifurcation which may represent a small mesenteric cyst or low-density lymph node.  Pelvic images demonstrate mild bladder distention. Prostate and rectum are within  normal. No free fluid.  There is a displaced intertrochanteric fracture of the right hip. There is a subtle nondisplaced fracture of the right inferior pubic ramus. Remaining bones soft tissues are unremarkable.  IMPRESSION: Displaced right intertrochanteric hip fracture. Nondisplaced fracture of the right inferior pubic ramus.  No acute findings within the chest. No acute intra-abdominal findings.  Minimal nonspecific increased number of small mesenteric lymph nodes. Recommend followup abdominal pelvic CT 3 months.  Minimal centrilobular emphysematous disease.  These results were called by telephone at the time of interpretation on 05/24/2014 at 5:28 pm to Dr. Ermalinda Memos, who verbally acknowledged these results.   Electronically Signed   By: Marin Olp M.D.   On: 05/24/2014 17:28   Dg Pelvis Portable  05/24/2014   CLINICAL DATA:  Patient fell 8 feet off a ladder with right hip region pain  EXAM: PORTABLE PELVIS 1-2 VIEWS  COMPARISON:  None.  FINDINGS: There is an intertrochanteric femur fracture on the right with mild varus angulation at the fracture site. Fracture extends into the greater trochanter region or fracture is comminuted. No other fractures. No dislocation. Joint spaces appear intact.  IMPRESSION: Comminuted intertrochanteric femur fracture on the right with mild varus angulation at the fracture site. No dislocation seen on this single view.   Electronically Signed   By: Lowella Grip M.D.   On: 05/24/2014 15:03   Ct 3d Recon At Scanner  05/25/2014   CLINICAL DATA:  Nonspecific (abnormal) findings on radiological and other examination of musculoskeletal system. Status post fall from the ladder, 6-8 feet. Right intertrochanteric fracture secondary to the fall.  EXAM: 3-DIMENSIONAL CT IMAGE RENDERING ON ACQUISITION WORKSTATION  TECHNIQUE: 3-dimensional CT images were rendered by post-processing of the original CT data on an acquisition workstation. The 3-dimensional CT images were interpreted and  findings were reported in the accompanying complete CT report for this study  COMPARISON:  CT chest, abdomen and pelvis 05/24/2014 at 16:51 hr  FINDINGS: As seen on the comparison examination, the patient has a right hip fracture. The fracture extends from the greater trochanter along the intertrochanteric ridge to the superior margin of the lesser trochanter. Nondisplaced right inferior pubic ramus fracture seen on CT scan is not well demonstrated on these images.  IMPRESSION: Acute right intertrochanteric fracture.  Nondisplaced right inferior pubic ramus fracture is better demonstrated on the comparison exam.  Electronically Signed   By: Inge Rise M.D.   On: 05/25/2014 07:12   Dg Chest Portable 1 View  05/24/2014   CLINICAL DATA:  Fall 6-8 feet from ladder today. Generalized right pain. Initial encounter  EXAM: PORTABLE CHEST - 1 VIEW  COMPARISON:  CT 01/19/2014  FINDINGS: Mild hyperinflation/COPD. Lungs are clear. No pneumothorax or effusion. Heart is normal size. No visible rib fracture or other acute bony abnormality.  IMPRESSION: COPD.  No active disease.   Electronically Signed   By: Rolm Baptise M.D.   On: 05/24/2014 15:03   Dg C-arm 1-60 Min  05/24/2014   CLINICAL DATA:  Fracture the distal radius and ulna, closed. Initially counter. Fluoroscopy time 16.3 seconds.  EXAM: RIGHT WRIST - 2 VIEW; DG C-ARM 61-120 MIN  COMPARISON:  New 05/24/2014  FINDINGS: The patient has undergone screw plate fixation of the distal radius. Distal ulnar fracture is again noted.  IMPRESSION: Interval ORIF of the distal radius.   Electronically Signed   By: Shon Hale M.D.   On: 05/24/2014 22:48   Dg C-arm 1-60 Min  05/24/2014   CLINICAL DATA:  Right femoral intertrochanteric fracture.  EXAM: DG C-ARM 61-120 MIN; RIGHT FEMUR - 2 VIEW  COMPARISON:  CT 05/24/2014  FINDINGS: Intraoperative images demonstrate surgical fixation of the intertrochanteric fracture with a dynamic hip screw. Intramedullary nail extends  into the distal femur. The right hip is located.  IMPRESSION: Internal fixation of the right femoral intertrochanteric fracture.   Electronically Signed   By: Markus Daft M.D.   On: 05/24/2014 22:43    Oren Binet, MD  [facility] Pager:[phone]  If 7PM-7AM, please contact night-coverage [email or web address] Password [email or web address] 05/25/2014, 11:15 AM   LOS: 1 day

## 2014-05-25 NOTE — Progress Notes (Signed)
Occupational Therapy Evaluation Patient Details Name: Joshua Reed MRN: [id number] DOB: 1946-07-02 Today's Date: 05/25/2014    History of Present Illness Joshua Reed. [name] s/p fall off of ladder at work resulting in R hip fx and R radial fx s/p R hip IM nailing and R ORIF to radius; WBAT on RLE and WB through platform on RUE   Clinical Impression   PTA pt lived at home and was independent with ADLs and IADLs. Pt currently limited by severe pain in RLE and RUE which impair his independence with ADLs. Pt is highly motivated and would be an excellent CIR candidate due to strong family support and desire to return to PLOF. Pt will benefit from acute OT to address limitations with RUE and to promote independence with functional transfers and ADLs.     Follow Up Recommendations  CIR;Supervision/Assistance - 24 hour    Equipment Recommendations  3 in 1 bedside comode    Recommendations for Other Services       Precautions / Restrictions Precautions Precautions: Fall Restrictions Weight Bearing Restrictions: Yes RUE Weight Bearing: Weight bear through elbow only (using PFRW) RLE Weight Bearing: Weight bearing as tolerated      Mobility Bed Mobility Overal bed mobility: Needs Assistance Bed Mobility: Sit to Supine       Sit to supine: Min assist   General bed mobility comments: (A) for Bil LEs onto bed. Pt leaned to Left elbow and returned trunk to supine slowly.   Transfers Overall transfer level: Needs assistance Equipment used:  (gait belt assisted transfer) Transfers: Sit to/from [employer] Transfers Sit to Stand: Mod assist Stand pivot transfers: Mod assist       General transfer comment: SPT from recliner to bed. Pt able to hold onto OT with his LUE. Pt reported that [place] in room was broken or would not work so did not want to attempt. However, per PT note she left it in room for pt to use with subsequent therapy sessions.     Balance Overall balance  assessment: Needs assistance Sitting-balance support: Single extremity supported;Feet supported Sitting balance-Leahy Scale: Poor Sitting balance - Comments: requires use of LUE for support due to pain on R side of body   Standing balance support: During functional activity Standing balance-Leahy Scale: Poor Standing balance comment: Pt with hobbling steps and unable to stand up straight and maintain balance with assist.                             ADL Overall ADL's : Needs assistance/impaired Eating/Feeding: Set up;Sitting Eating/Feeding Details (indicate cue type and reason): Pt requires assistance to open containers and cut food.  Grooming: Set up;Sitting   Upper Body Bathing: Minimal assitance;Sitting Upper Body Bathing Details (indicate cue type and reason): pt unable to maintain balance sitting on edge of chair without UE support and will require assistance Lower Body Bathing: Maximal assistance;Sit to/from stand   Upper Body Dressing : Moderate assistance;Sitting Upper Body Dressing Details (indicate cue type and reason): (A) for balance in sitting and to thread RUE Lower Body Dressing: Total assistance;Sit to/from stand   Toilet Transfer: Moderate assistance;Stand-pivot (gait belt assisted transfer)             General ADL Comments: Pt limited by severe pain today in RLE and RUE. Pt with difficulty sitting EOB/chair and leans to the left to take pressure off his right side.      Vision  Pt reports no change from baseline.                    Perception Perception Perception Tested?: No   Praxis Praxis Praxis tested?: Within functional limits    Pertinent Vitals/Pain Pain Assessment: 0-10 Pain Score: 10-Worst pain ever Pain Location: R arm and RLE Pain Descriptors / Indicators: Aching;Throbbing;Tender Pain Intervention(s): Limited activity within patient's tolerance;Monitored during session;Repositioned;Ice applied     Hand Dominance  Right   Extremity/Trunk Assessment Upper Extremity Assessment Upper Extremity Assessment: RUE deficits/detail;Generalized weakness RUE Deficits / Details: WB through elbow only; pt in cast from MCPs to distal elbow. Pt with edema noted in RUE fingers. Pt with minimal tolerance for R finger flexion/extension due to pain and swelling.   RUE: Unable to fully assess due to pain;Unable to fully assess due to immobilization RUE Sensation: decreased light touch (likely due to edema) RUE Coordination: decreased fine motor   Lower Extremity Assessment Lower Extremity Assessment: Defer to PT evaluation   Cervical / Trunk Assessment Cervical / Trunk Assessment: Normal   Communication Communication Communication: No difficulties   Cognition Arousal/Alertness: Awake/alert Behavior During Therapy: WFL for tasks assessed/performed Overall Cognitive Status: Within Functional Limits for tasks assessed                     General Comments    Educated pt on positioning of RUE for edema control. Repositioned pt with pillows and provided ice to assist with swelling. Also encouraged pt to use deep breathing techniques to assist with pain.     Exercises Exercises: Other exercises Other Exercises Other Exercises: encouraged pt to move RUE fingers within the limits of tolerance throughout the night to assist with edema and ROM.         Home Living Family/patient expects to be discharged to:: Inpatient rehab Living Arrangements: Spouse/significant other                               Additional Comments: Pt's wife is very anxious in regards to d/c planning. She does not feel ready to take care of him and verbalizes fear of him falling. They would like rehab (CIR or SNF) prior to d/c home. Therapist in agreement       Prior Functioning/Environment Level of Independence: Independent        Comments: Pt works for [employer]. Fall occurred at work    OT Diagnosis:  Generalized weakness;Acute pain   OT Problem List: Decreased strength;Decreased range of motion;Decreased activity tolerance;Impaired balance (sitting and/or standing);Decreased safety awareness;Decreased knowledge of use of DME or AE;Decreased knowledge of precautions;Impaired UE functional use;Pain   OT Treatment/Interventions: Self-care/ADL training;Therapeutic exercise;Energy conservation;DME and/or AE instruction;Therapeutic activities;Patient/family education;Balance training    OT Goals(Current goals can be found in the care plan section) Acute Rehab OT Goals Patient Stated Goal: to move better and have my arm hurt less OT Goal Formulation: With patient Time For Goal Achievement: 06/08/14 Potential to Achieve Goals: Good ADL Goals Pt Will Perform Grooming: with min assist;standing Pt Will Perform Upper Body Bathing: with set-up;with supervision;sitting Pt Will Perform Upper Body Dressing: with set-up;with supervision;sitting Pt Will Transfer to Toilet: with min guard assist;stand pivot transfer;bedside commode Pt/caregiver will Perform Home Exercise Program: Increased ROM;Right Upper extremity;Independently Additional ADL Goal #1: Pt will perform bed mobility with Supervision to prepare for ADLs.   OT Frequency: Min 3X/week    End of Session Equipment Utilized  During Treatment: Gait belt Nurse Communication: Mobility status  Activity Tolerance: Patient limited by pain Patient left: in bed;with call bell/phone within reach;with family/visitor present   Time: 1517-6160 OT Time Calculation (min): 19 min Charges:  OT General Charges $OT Visit: 1 Procedure OT Evaluation $Initial OT Evaluation Tier I: 1 Procedure OT Treatments $Self Care/Home Management : 8-22 mins  Villa Herb M 05/25/2014, 4:20 PM   Cyndie Chime, OTR/L Occupational Therapist [phone] (pager)

## 2014-05-26 DIAGNOSIS — S62101S Fracture of unspecified carpal bone, right wrist, sequela: Secondary | ICD-10-CM

## 2014-05-26 DIAGNOSIS — S72009S Fracture of unspecified part of neck of unspecified femur, sequela: Secondary | ICD-10-CM

## 2014-05-26 DIAGNOSIS — S7291XS Unspecified fracture of right femur, sequela: Secondary | ICD-10-CM

## 2014-05-26 LAB — GLUCOSE, CAPILLARY: GLUCOSE-CAPILLARY: 162 mg/dL — AB (ref 70–99)

## 2014-05-26 LAB — ABO/RH: ABO/RH(D): A POS

## 2014-05-26 LAB — CBC
HCT: 24.5 % — ABNORMAL LOW (ref 39.0–52.0)
HEMOGLOBIN: 8.3 g/dL — AB (ref 13.0–17.0)
MCH: 27.9 pg (ref 26.0–34.0)
MCHC: 33.9 g/dL (ref 30.0–36.0)
MCV: 82.2 fL (ref 78.0–100.0)
Platelets: 162 10*3/uL (ref 150–400)
RBC: 2.98 MIL/uL — AB (ref 4.22–5.81)
RDW: 16.1 % — ABNORMAL HIGH (ref 11.5–15.5)
WBC: 7.3 10*3/uL (ref 4.0–10.5)

## 2014-05-26 LAB — PREPARE RBC (CROSSMATCH)

## 2014-05-26 LAB — PHENYTOIN LEVEL, TOTAL: Phenytoin Lvl: 16.7 ug/mL (ref 10.0–20.0)

## 2014-05-26 MED ORDER — MORPHINE SULFATE 2 MG/ML IJ SOLN
1.0000 mg | INTRAMUSCULAR | Status: AC
  Administered 2014-05-26: 1 mg via INTRAVENOUS

## 2014-05-26 MED ORDER — PHENYTOIN SODIUM EXTENDED 100 MG PO CAPS
200.0000 mg | ORAL_CAPSULE | Freq: Two times a day (BID) | ORAL | Status: DC
  Administered 2014-05-26 – 2014-05-29 (×6): 200 mg via ORAL
  Filled 2014-05-26 (×7): qty 2

## 2014-05-26 MED ORDER — DIPHENHYDRAMINE HCL 50 MG/ML IJ SOLN
25.0000 mg | Freq: Once | INTRAMUSCULAR | Status: AC
  Administered 2014-05-26: 25 mg via INTRAVENOUS
  Filled 2014-05-26: qty 1

## 2014-05-26 MED ORDER — ACETAMINOPHEN 325 MG PO TABS
650.0000 mg | ORAL_TABLET | Freq: Once | ORAL | Status: AC
  Administered 2014-05-26: 650 mg via ORAL
  Filled 2014-05-26: qty 2

## 2014-05-26 MED ORDER — FUROSEMIDE 10 MG/ML IJ SOLN
20.0000 mg | Freq: Once | INTRAMUSCULAR | Status: AC
  Administered 2014-05-26: 20 mg via INTRAVENOUS
  Filled 2014-05-26: qty 2

## 2014-05-26 MED ORDER — KETOROLAC TROMETHAMINE 30 MG/ML IJ SOLN
30.0000 mg | Freq: Four times a day (QID) | INTRAMUSCULAR | Status: AC | PRN
  Administered 2014-05-26 – 2014-05-28 (×3): 30 mg via INTRAVENOUS
  Filled 2014-05-26 (×3): qty 1

## 2014-05-26 MED ORDER — SODIUM CHLORIDE 0.9 % IV SOLN: Freq: Once | INTRAVENOUS | Status: DC

## 2014-05-26 NOTE — Progress Notes (Addendum)
PATIENT DETAILS Name: Joshua Reed Age: 68 y.o. Sex: male Date of Birth: 1946-05-28 Admit Date: 05/24/2014 Admitting Physician Thurnell Lose, MD TTS:[id number] Marigene Ehlers, MD  Subjective: Complains of significant pain at right hand area.  Assessment/Plan: Principal Problem:   Hip fracture, right:secondary to a mechanical fall,underwent right hip repair with intramedullary implant on 11/4. Orthopedics following,Lovenox for DVT prophylaxis, weightbearing as tolerated. Suspect will need SNF vs CIR, social worker aware.Post perioperative course complicated by blood loss anemia, transfused 2 units PRBC on 05/26/14. Follow Hb.  Active Problems:   Right wrist fracture:secondary to mechanical fall, underwent open reduction and internal fixation on 11/4. Significant pain in the right hand area, seen by orthopedics,per orthopedics-pain in wrist is consistent with postop course.Defer to orthopedics.   Anemia:likely secondary to perioperative blood loss, 2 units PRBC transfusion 11/6. Follow Hb    History of seizure disorder: change Dilantin to 200 mg in a.m., 300 mg in p.m. Obtain Dilantin level 16.7. Last seizure 1 year back.   COPD (chronic obstructive pulmonary disease):lungs clear.continue with Spiriva, and prn albuterol.   Remote Alcohol abuse:quit more than 4 years back, alcohol level <11 on admission. No signs of withdrawal.   Tobacco abuse: counseled, start transdermal nicotine.    Moderate protein calorie malnutrition: Continue supplements  Disposition: Remain inpatient-SNF vs CIR on discharge  Antibiotics:  None  DVT Prophylaxis: Prophylactic Lovenox   Code Status: Full code   Family Communication Spouse at bedside  Procedures:  Right hip/right wrist repair on 11/4  CONSULTS:  orthopedic surgery  Time spent 40 minutes-which includes 50% of the time with face-to-face with patient/ family and coordinating care related to the above assessment and  plan.  MEDICATIONS: Scheduled Meds: . sodium chloride   Intravenous Once  . cholecalciferol  1,000 Units Oral Daily  . enoxaparin (LOVENOX) injection  40 mg Subcutaneous Q24H  . Influenza vac split quadrivalent PF  0.5 mL Intramuscular Tomorrow-1000  . lactose free nutrition  237 mL Oral BID BM  . nicotine  21 mg Transdermal Daily  . phenytoin  200 mg Oral Daily   And  . phenytoin  300 mg Oral QHS  . senna  1 tablet Oral BID  . tiotropium  18 mcg Inhalation Daily   Continuous Infusions:  PRN Meds:.acetaminophen **OR** acetaminophen, albuterol, alum & mag hydroxide-simeth, guaiFENesin-dextromethorphan, HYDROcodone-acetaminophen, ketorolac, menthol-cetylpyridinium **OR** phenol, methocarbamol **OR** methocarbamol (ROBAXIN)  IV, metoCLOPramide **OR** metoCLOPramide (REGLAN) injection, metoprolol, morphine injection, ondansetron **OR** ondansetron (ZOFRAN) IV, oxyCODONE, polyethylene glycol, sorbitol  Antibiotics: Anti-infectives    Start     Dose/Rate Route Frequency Ordered Stop   05/25/14 0200  ceFAZolin (ANCEF) IVPB 2 g/50 mL premix     2 g100 mL/hr over 30 Minutes Intravenous Every 6 hours 05/24/14 2339 05/25/14 1630       PHYSICAL EXAM: Vital signs in last 24 hours: Filed Vitals:   05/26/14 0755 05/26/14 1015 05/26/14 1040 05/26/14 1401  BP:  97/57 104/55 106/55  Pulse:  80 80 88  Temp:  99.1 F (37.3 C) 99 F (37.2 C) 98.6 F (37 C)  TempSrc:  Oral Oral Oral  Resp:  20 18 18   Height:      Weight:      SpO2: 96% 96% 96% 96%    Weight change: 7.332 kg (16 lb 2.6 oz) Filed Weights   05/24/14 1438 05/25/14 0654 05/26/14 0500  Weight: 58.968 kg (130 lb) 68.3 kg (150 lb 9.2 oz) 66.3 kg (146  lb 2.6 oz)   Body mass index is 22.89 kg/(m^2).   Gen Exam: Awake and alert with clear speech.   Neck: Supple, No JVD.   Chest: B/L Clear. No rales  CVS: S1 S2 Regular, no murmurs.  Abdomen: soft, BS +, non tender, non distended.  Extremities: no edema, lower extremities warm  to touch.Right finger tips-good capillary refill. Neurologic: Non Focal.  Skin: No Rash.   Wounds: N/A.    Intake/Output from previous day:  Intake/Output Summary (Last 24 hours) at 05/26/14 1414 Last data filed at 05/26/14 1025  Gross per 24 hour  Intake    860 ml  Output    875 ml  Net    -15 ml     LAB RESULTS: CBC  Recent Labs Lab 05/24/14 1614 05/24/14 1624 05/25/14 0025 05/26/14 0600  WBC 10.8*  --  8.7 7.8  HGB 10.4* 11.6* 9.2* 6.5*  HCT 31.9* 34.0* 28.2* 19.1*  PLT 220  --  227 170  MCV 85.1  --  86.8 85.7  MCH 27.7  --  28.3 29.1  MCHC 32.6  --  32.6 34.0  RDW 15.7*  --  15.9* 16.0*    Chemistries   Recent Labs Lab 05/24/14 1614 05/24/14 1624 05/25/14 0025  NA 136* 135* 135*  K 4.8 4.4 4.7  CL 101 100 99  CO2 23  --  26  GLUCOSE 98 100* 180*  BUN 12 11 10   CREATININE 0.77 0.80 0.67  CALCIUM 7.8*  --  7.7*    CBG: No results for input(s): GLUCAP in the last 168 hours.  GFR Estimated Creatinine Clearance: 83.8 mL/min (by C-G formula based on Cr of 0.67).  Coagulation profile  Recent Labs Lab 05/24/14 1614 05/25/14 0025  INR 1.23 1.31    Cardiac Enzymes No results for input(s): CKMB, TROPONINI, MYOGLOBIN in the last 168 hours.  Invalid input(s): CK  Invalid input(s): POCBNP No results for input(s): DDIMER in the last 72 hours. No results for input(s): HGBA1C in the last 72 hours. No results for input(s): CHOL, HDL, LDLCALC, TRIG, CHOLHDL, LDLDIRECT in the last 72 hours. No results for input(s): TSH, T4TOTAL, T3FREE, THYROIDAB in the last 72 hours.  Invalid input(s): FREET3 No results for input(s): VITAMINB12, FOLATE, FERRITIN, TIBC, IRON, RETICCTPCT in the last 72 hours. No results for input(s): LIPASE, AMYLASE in the last 72 hours.  Urine Studies No results for input(s): UHGB, CRYS in the last 72 hours.  Invalid input(s): UACOL, UAPR, USPG, UPH, UTP, UGL, UKET, UBIL, UNIT, UROB, ULEU, UEPI, UWBC, URBC, UBAC, CAST, UCOM,  BILUA  MICROBIOLOGY: No results found for this or any previous visit (from the past 240 hour(s)).  RADIOLOGY STUDIES/RESULTS: Dg Wrist 2 Views Right  05/24/2014   CLINICAL DATA:  Fracture the distal radius and ulna, closed. Initially counter. Fluoroscopy time 16.3 seconds.  EXAM: RIGHT WRIST - 2 VIEW; DG C-ARM 61-120 MIN  COMPARISON:  New 05/24/2014  FINDINGS: The patient has undergone screw plate fixation of the distal radius. Distal ulnar fracture is again noted.  IMPRESSION: Interval ORIF of the distal radius.   Electronically Signed   By: Shon Hale M.D.   On: 05/24/2014 22:48   Dg Wrist 2 Views Right  05/24/2014   CLINICAL DATA:  Post reduction wrist fracture.  EXAM: RIGHT WRIST - 2 VIEW  COMPARISON:  05/24/2014  FINDINGS: Wrist is imaged through splinting material. There are comminuted fractures of the distal radius and ulna. There has been interval reduction with  significant improvement in the angulation of the radiocarpal joint. Intercarpal spaces are grossly normal. Nondisplaced carpal fractures would be difficult to exclude but are not identified.  IMPRESSION: 1. Interval reduction of wrist fractures. 2. Significantly improved radiocarpal angulation.   Electronically Signed   By: Shon Hale M.D.   On: 05/24/2014 18:41   Dg Wrist 2 Views Right  05/24/2014   CLINICAL DATA:  [place] off ladder today from 10 feet. Deformity of right wrist. Right wrist pain. Initial encounter.  EXAM: RIGHT WRIST - 2 VIEW  COMPARISON:  None.  FINDINGS: Study is limited due to patient condition and limited positioning. Comminuted fracture noted through the distal right radius with impaction. No definite dislocation. Ulnar styloid fracture. Diffuse soft tissue swelling noted.  IMPRESSION: Comminuted, impacted distal right radial fracture. Distal ulnar/ulnar styloid fracture.   Electronically Signed   By: Rolm Baptise M.D.   On: 05/24/2014 15:49   Dg Femur Right  05/24/2014   CLINICAL DATA:  Right femoral  intertrochanteric fracture.  EXAM: DG C-ARM 61-120 MIN; RIGHT FEMUR - 2 VIEW  COMPARISON:  CT 05/24/2014  FINDINGS: Intraoperative images demonstrate surgical fixation of the intertrochanteric fracture with a dynamic hip screw. Intramedullary nail extends into the distal femur. The right hip is located.  IMPRESSION: Internal fixation of the right femoral intertrochanteric fracture.   Electronically Signed   By: Markus Daft M.D.   On: 05/24/2014 22:43   Ct Chest W Contrast  05/24/2014   CLINICAL DATA:  Trauma with fall from ladder 6-8 feet. Unable to raise arms above head.  EXAM: CT CHEST, ABDOMEN, AND PELVIS WITH CONTRAST  TECHNIQUE: Multidetector CT imaging of the chest, abdomen and pelvis was performed following the standard protocol during bolus administration of intravenous contrast.  CONTRAST:  81mL OMNIPAQUE IOHEXOL 300 MG/ML  SOLN  COMPARISON:  CT chest 01/19/2014  FINDINGS: CT CHEST FINDINGS  The lungs are well inflated without consolidation or effusion. There is no pneumothorax. There is mild centrilobular emphysematous disease. Airways are within normal. Heart is normal in size. There is minimal calcified plaque over the thoracic aorta. Remaining mediastinal structures are unremarkable.  CT ABDOMEN AND PELVIS FINDINGS  The exam demonstrates a normal spleen, pancreas, gallbladder and adrenal glands. There is minimal stable atrophy over the anterior right lobe of the liver as a liver is otherwise unremarkable. Kidneys are within normal. There is calcified plaque over the abdominal aorta. There is no free fluid or free peritoneal air. Appendix is not seen. There is a slight increased number of small mesenteric lymph nodes. There is a 1.2 cm oval low-density focus within the mesentery of the mid abdomen left of midline just below the aortic bifurcation which may represent a small mesenteric cyst or low-density lymph node.  Pelvic images demonstrate mild bladder distention. Prostate and rectum are within  normal. No free fluid.  There is a displaced intertrochanteric fracture of the right hip. There is a subtle nondisplaced fracture of the right inferior pubic ramus. Remaining bones soft tissues are unremarkable.  IMPRESSION: Displaced right intertrochanteric hip fracture. Nondisplaced fracture of the right inferior pubic ramus.  No acute findings within the chest. No acute intra-abdominal findings.  Minimal nonspecific increased number of small mesenteric lymph nodes. Recommend followup abdominal pelvic CT 3 months.  Minimal centrilobular emphysematous disease.  These results were called by telephone at the time of interpretation on 05/24/2014 at 5:28 pm to Dr. Ermalinda Memos, who verbally acknowledged these results.   Electronically Signed   By: Quillian Quince  Derrel Nip M.D.   On: 05/24/2014 17:28   Ct Abdomen Pelvis W Contrast  05/24/2014   CLINICAL DATA:  Trauma with fall from ladder 6-8 feet. Unable to raise arms above head.  EXAM: CT CHEST, ABDOMEN, AND PELVIS WITH CONTRAST  TECHNIQUE: Multidetector CT imaging of the chest, abdomen and pelvis was performed following the standard protocol during bolus administration of intravenous contrast.  CONTRAST:  3mL OMNIPAQUE IOHEXOL 300 MG/ML  SOLN  COMPARISON:  CT chest 01/19/2014  FINDINGS: CT CHEST FINDINGS  The lungs are well inflated without consolidation or effusion. There is no pneumothorax. There is mild centrilobular emphysematous disease. Airways are within normal. Heart is normal in size. There is minimal calcified plaque over the thoracic aorta. Remaining mediastinal structures are unremarkable.  CT ABDOMEN AND PELVIS FINDINGS  The exam demonstrates a normal spleen, pancreas, gallbladder and adrenal glands. There is minimal stable atrophy over the anterior right lobe of the liver as a liver is otherwise unremarkable. Kidneys are within normal. There is calcified plaque over the abdominal aorta. There is no free fluid or free peritoneal air. Appendix is not seen. There is a  slight increased number of small mesenteric lymph nodes. There is a 1.2 cm oval low-density focus within the mesentery of the mid abdomen left of midline just below the aortic bifurcation which may represent a small mesenteric cyst or low-density lymph node.  Pelvic images demonstrate mild bladder distention. Prostate and rectum are within normal. No free fluid.  There is a displaced intertrochanteric fracture of the right hip. There is a subtle nondisplaced fracture of the right inferior pubic ramus. Remaining bones soft tissues are unremarkable.  IMPRESSION: Displaced right intertrochanteric hip fracture. Nondisplaced fracture of the right inferior pubic ramus.  No acute findings within the chest. No acute intra-abdominal findings.  Minimal nonspecific increased number of small mesenteric lymph nodes. Recommend followup abdominal pelvic CT 3 months.  Minimal centrilobular emphysematous disease.  These results were called by telephone at the time of interpretation on 05/24/2014 at 5:28 pm to Dr. Ermalinda Memos, who verbally acknowledged these results.   Electronically Signed   By: Marin Olp M.D.   On: 05/24/2014 17:28   Dg Pelvis Portable  05/24/2014   CLINICAL DATA:  Patient fell 8 feet off a ladder with right hip region pain  EXAM: PORTABLE PELVIS 1-2 VIEWS  COMPARISON:  None.  FINDINGS: There is an intertrochanteric femur fracture on the right with mild varus angulation at the fracture site. Fracture extends into the greater trochanter region or fracture is comminuted. No other fractures. No dislocation. Joint spaces appear intact.  IMPRESSION: Comminuted intertrochanteric femur fracture on the right with mild varus angulation at the fracture site. No dislocation seen on this single view.   Electronically Signed   By: Lowella Grip M.D.   On: 05/24/2014 15:03   Ct 3d Recon At Scanner  05/25/2014   CLINICAL DATA:  Nonspecific (abnormal) findings on radiological and other examination of musculoskeletal  system. Status post fall from the ladder, 6-8 feet. Right intertrochanteric fracture secondary to the fall.  EXAM: 3-DIMENSIONAL CT IMAGE RENDERING ON ACQUISITION WORKSTATION  TECHNIQUE: 3-dimensional CT images were rendered by post-processing of the original CT data on an acquisition workstation. The 3-dimensional CT images were interpreted and findings were reported in the accompanying complete CT report for this study  COMPARISON:  CT chest, abdomen and pelvis 05/24/2014 at 16:51 hr  FINDINGS: As seen on the comparison examination, the patient has a right hip fracture. The  fracture extends from the greater trochanter along the intertrochanteric ridge to the superior margin of the lesser trochanter. Nondisplaced right inferior pubic ramus fracture seen on CT scan is not well demonstrated on these images.  IMPRESSION: Acute right intertrochanteric fracture.  Nondisplaced right inferior pubic ramus fracture is better demonstrated on the comparison exam.   Electronically Signed   By: Inge Rise M.D.   On: 05/25/2014 07:12   Dg Chest Portable 1 View  05/24/2014   CLINICAL DATA:  Fall 6-8 feet from ladder today. Generalized right pain. Initial encounter  EXAM: PORTABLE CHEST - 1 VIEW  COMPARISON:  CT 01/19/2014  FINDINGS: Mild hyperinflation/COPD. Lungs are clear. No pneumothorax or effusion. Heart is normal size. No visible rib fracture or other acute bony abnormality.  IMPRESSION: COPD.  No active disease.   Electronically Signed   By: Rolm Baptise M.D.   On: 05/24/2014 15:03   Dg C-arm 1-60 Min  05/24/2014   CLINICAL DATA:  Fracture the distal radius and ulna, closed. Initially counter. Fluoroscopy time 16.3 seconds.  EXAM: RIGHT WRIST - 2 VIEW; DG C-ARM 61-120 MIN  COMPARISON:  New 05/24/2014  FINDINGS: The patient has undergone screw plate fixation of the distal radius. Distal ulnar fracture is again noted.  IMPRESSION: Interval ORIF of the distal radius.   Electronically Signed   By: Shon Hale M.D.    On: 05/24/2014 22:48   Dg C-arm 1-60 Min  05/24/2014   CLINICAL DATA:  Right femoral intertrochanteric fracture.  EXAM: DG C-ARM 61-120 MIN; RIGHT FEMUR - 2 VIEW  COMPARISON:  CT 05/24/2014  FINDINGS: Intraoperative images demonstrate surgical fixation of the intertrochanteric fracture with a dynamic hip screw. Intramedullary nail extends into the distal femur. The right hip is located.  IMPRESSION: Internal fixation of the right femoral intertrochanteric fracture.   Electronically Signed   By: Markus Daft M.D.   On: 05/24/2014 22:43    Oren Binet, MD  [facility] Pager:[phone]  If 7PM-7AM, please contact night-coverage [email or web address] Password [email or web address] 05/26/2014, 2:14 PM   LOS: 2 days

## 2014-05-26 NOTE — Progress Notes (Signed)
Pt c/o pain 10/10 with new increased swelling noted to RUE. [facility] paged. Received call back and NP on call ordered for a one time dose of 1mg  of morphine. Orders carried out. Pt repositioned, arm re-elevated on pillows x5, ice applied, full sensation present, cap refill <3 sec, RN able to fit fingers between pts arm and ace wrap splint, pts arm feels soft and warm to touch. Pt encouraged to wiggle and massage fingers to help reduce swelling. Nursing will continue to monitor.

## 2014-05-26 NOTE — Consult Note (Signed)
Physical Medicine and Rehabilitation Consult  Reason for Consult: Right hip fracture, right wrist fracture Referring Physician: Dr. Ivor Reining   HPI: Joshua Reed is a 68 y.o. male with history of COPD, alcohol abuse, macular degeneration, who was working on a house on 05/24/14 when he slipped and fell 6-8 feet off a ladder. He hit his right side on concrete with onset of right hip pain and right wrist deformity. Work up in ED revealed right distal radius fracture and right IT hip fracture. He was evaluated by Dr. Erlinda Hong and right wrist was manipulated and splinted in ED and he underwent IM nail of right hip fracture the same day. Post op WBAT on RLE and weight bearing through right elbow. PT/OT evaluations done yesterday and CIR recommended for follow up therapy. Follow up labs today with  worsening of ABLA with drop in H/H to 6.5   Review of Systems  HENT: Negative for hearing loss.   Eyes: Negative for blurred vision and double vision.  Respiratory: Positive for shortness of breath. Negative for cough and wheezing.   Cardiovascular: Negative for chest pain and palpitations.  Gastrointestinal: Positive for constipation. Negative for heartburn, nausea and abdominal pain.  Genitourinary: Positive for dysuria and frequency. Negative for urgency.  Musculoskeletal: Positive for myalgias and joint pain. Negative for back pain.  Neurological: Negative for dizziness, tingling and headaches.  Psychiatric/Behavioral: Negative for memory loss. The patient has insomnia (due to pain).       Past Medical History  Diagnosis Date  . Seizure disorder 2205    ALCOHOL RELATED  . Macular degeneration   . COPD (chronic obstructive pulmonary disease)   . Alcohol abuse   . Vitamin D deficiency    Past Surgical History  Procedure Laterality Date  . Cardiac catheterization  2002    normal  . Open reduction internal fixation (orif) distal radial fracture Right 05/24/2014    Procedure: OPEN  REDUCTION INTERNAL FIXATION (ORIF) DISTAL RADIAL FRACTURE;  Surgeon: Marianna Payment, MD;  Location: [facility];  Service: Orthopedics;  Laterality: Right;  . Intramedullary (im) nail intertrochanteric Right 05/24/2014    Procedure: INTRAMEDULLARY (IM) NAIL INTERTROCHANTRIC;  Surgeon: Marianna Payment, MD;  Location: [facility];  Service: Orthopedics;  Laterality: Right;   Family History  Problem Relation Age of Onset  . Heart attack Father    Social History:   Married. Wife at home and can provide supervision past discharge. Works for [employer]. He reports that he has been smoking Cigarettes.  He has been smoking about 1 PPD. He does not have any smokeless tobacco history on file. Per reports that he does not drink alcohol. His drug history is not on file.     Allergies  Allergen Reactions  . Acyclovir And Related    Medications Prior to Admission  Medication Sig Dispense Refill  . phenytoin (DILANTIN) 100 MG ER capsule Take 500 mg by mouth every morning.     . cholecalciferol (VITAMIN D) 1000 UNITS tablet Take 1,000 Units by mouth daily.      Home: Home Living Family/patient expects to be discharged to:: Inpatient rehab Living Arrangements: Spouse/significant other Additional Comments: Pt's wife is very anxious in regards to d/c planning. She does not feel ready to take care of him and verbalizes fear of him falling. They would like rehab (CIR or SNF) prior to d/c home. Therapist in agreement   Functional History: Prior Function Level of Independence: Independent Comments: Pt works  for [employer]. Fall occurred at work Functional Status:  Mobility: Bed Mobility Overal bed mobility: Needs Assistance Bed Mobility: Sit to Supine Supine to sit: Mod assist Sit to supine: Min assist General bed mobility comments: (A) for Bil LEs onto bed. Pt leaned to Left elbow and returned trunk to supine slowly.  Transfers Overall transfer level: Needs  assistance Equipment used:  (gait belt assisted transfer) Transfers: Sit to/from Stand, Stand Pivot Transfers Sit to Stand: Mod assist Stand pivot transfers: Mod assist General transfer comment: SPT from recliner to bed. Pt able to hold onto OT with his LUE. Pt reported that [place] in room was broken or would not work so did not want to attempt. However, per PT note she left it in room for pt to use with subsequent therapy sessions.  Ambulation/Gait General Gait Details: not able to assess on eval    ADL: ADL Overall ADL's : Needs assistance/impaired Eating/Feeding: Set up, Sitting Eating/Feeding Details (indicate cue type and reason): Pt requires assistance to open containers and cut food.  Grooming: Set up, Sitting Upper Body Bathing: Minimal assitance, Sitting Upper Body Bathing Details (indicate cue type and reason): pt unable to maintain balance sitting on edge of chair without UE support and will require assistance Lower Body Bathing: Maximal assistance, Sit to/from stand Upper Body Dressing : Moderate assistance, Sitting Upper Body Dressing Details (indicate cue type and reason): (A) for balance in sitting and to thread RUE Lower Body Dressing: Total assistance, Sit to/from stand Toilet Transfer: Moderate assistance, Stand-pivot (gait belt assisted transfer) General ADL Comments: Pt limited by severe pain today in RLE and RUE. Pt with difficulty sitting EOB/chair and leans to the left to take pressure off his right side.   Cognition: Cognition Overall Cognitive Status: Within Functional Limits for tasks assessed Orientation Level: Oriented X4 Cognition Arousal/Alertness: Awake/alert Behavior During Therapy: WFL for tasks assessed/performed Overall Cognitive Status: Within Functional Limits for tasks assessed  Blood pressure 113/60, pulse 85, temperature 98.5 F (36.9 C), temperature source Oral, resp. rate 16, height 5\' 7"  (1.702 m), weight 66.3 kg (146 lb 2.6 oz), SpO2 96  %. Physical Exam  Constitutional: He appears well-developed and well-nourished.  HENT:  Head: Normocephalic.  Eyes: Conjunctivae and EOM are normal. Pupils are equal, round, and reactive to light.  Neck: No tracheal deviation present. No thyromegaly present.  Cardiovascular: Normal rate.   Respiratory: Effort normal.  GI: He exhibits no distension.  Musculoskeletal: He exhibits edema.  Right arm in splint, fingers swollen but can grip. Unable to lift right leg off pillow. Some swelling in right leg.   Neurological:  Alert and appropriate. No sensory loss. Limbs only inhibited by ortho/pain issues.   Psychiatric: He has a normal mood and affect. His behavior is normal. Thought content normal.    Results for orders placed or performed during the hospital encounter of 05/24/14 (from the past 24 hour(s))  CBC     Status: Abnormal   Collection Time: 05/26/14  6:00 AM  Result Value Ref Range   WBC 7.8 4.0 - 10.5 K/uL   RBC 2.23 (L) 4.22 - 5.81 MIL/uL   Hemoglobin 6.5 (LL) 13.0 - 17.0 g/dL   HCT 19.1 (L) 39.0 - 52.0 %   MCV 85.7 78.0 - 100.0 fL   MCH 29.1 26.0 - 34.0 pg   MCHC 34.0 30.0 - 36.0 g/dL   RDW 16.0 (H) 11.5 - 15.5 %   Platelets 170 150 - 400 K/uL  Phenytoin level, total  Status: None   Collection Time: 05/26/14  6:00 AM  Result Value Ref Range   Phenytoin Lvl 16.7 10.0 - 20.0 ug/mL   Dg Wrist 2 Views Right  05/24/2014   CLINICAL DATA:  Fracture the distal radius and ulna, closed. Initially counter. Fluoroscopy time 16.3 seconds.  EXAM: RIGHT WRIST - 2 VIEW; DG C-ARM 61-120 MIN  COMPARISON:  New 05/24/2014  FINDINGS: The patient has undergone screw plate fixation of the distal radius. Distal ulnar fracture is again noted.  IMPRESSION: Interval ORIF of the distal radius.   Electronically Signed   By: Shon Hale M.D.   On: 05/24/2014 22:48   Dg Wrist 2 Views Right  05/24/2014   CLINICAL DATA:  Post reduction wrist fracture.  EXAM: RIGHT WRIST - 2 VIEW  COMPARISON:   05/24/2014  FINDINGS: Wrist is imaged through splinting material. There are comminuted fractures of the distal radius and ulna. There has been interval reduction with significant improvement in the angulation of the radiocarpal joint. Intercarpal spaces are grossly normal. Nondisplaced carpal fractures would be difficult to exclude but are not identified.  IMPRESSION: 1. Interval reduction of wrist fractures. 2. Significantly improved radiocarpal angulation.   Electronically Signed   By: Shon Hale M.D.   On: 05/24/2014 18:41   Dg Wrist 2 Views Right  05/24/2014   CLINICAL DATA:  [place] off ladder today from 10 feet. Deformity of right wrist. Right wrist pain. Initial encounter.  EXAM: RIGHT WRIST - 2 VIEW  COMPARISON:  None.  FINDINGS: Study is limited due to patient condition and limited positioning. Comminuted fracture noted through the distal right radius with impaction. No definite dislocation. Ulnar styloid fracture. Diffuse soft tissue swelling noted.  IMPRESSION: Comminuted, impacted distal right radial fracture. Distal ulnar/ulnar styloid fracture.   Electronically Signed   By: Rolm Baptise M.D.   On: 05/24/2014 15:49   Dg Femur Right  05/24/2014   CLINICAL DATA:  Right femoral intertrochanteric fracture.  EXAM: DG C-ARM 61-120 MIN; RIGHT FEMUR - 2 VIEW  COMPARISON:  CT 05/24/2014  FINDINGS: Intraoperative images demonstrate surgical fixation of the intertrochanteric fracture with a dynamic hip screw. Intramedullary nail extends into the distal femur. The right hip is located.  IMPRESSION: Internal fixation of the right femoral intertrochanteric fracture.   Electronically Signed   By: Markus Daft M.D.   On: 05/24/2014 22:43   Ct Chest W Contrast  05/24/2014   CLINICAL DATA:  Trauma with fall from ladder 6-8 feet. Unable to raise arms above head.  EXAM: CT CHEST, ABDOMEN, AND PELVIS WITH CONTRAST  TECHNIQUE: Multidetector CT imaging of the chest, abdomen and pelvis was performed following the standard  protocol during bolus administration of intravenous contrast.  CONTRAST:  5mL OMNIPAQUE IOHEXOL 300 MG/ML  SOLN  COMPARISON:  CT chest 01/19/2014  FINDINGS: CT CHEST FINDINGS  The lungs are well inflated without consolidation or effusion. There is no pneumothorax. There is mild centrilobular emphysematous disease. Airways are within normal. Heart is normal in size. There is minimal calcified plaque over the thoracic aorta. Remaining mediastinal structures are unremarkable.  CT ABDOMEN AND PELVIS FINDINGS  The exam demonstrates a normal spleen, pancreas, gallbladder and adrenal glands. There is minimal stable atrophy over the anterior right lobe of the liver as a liver is otherwise unremarkable. Kidneys are within normal. There is calcified plaque over the abdominal aorta. There is no free fluid or free peritoneal air. Appendix is not seen. There is a slight increased number of small  mesenteric lymph nodes. There is a 1.2 cm oval low-density focus within the mesentery of the mid abdomen left of midline just below the aortic bifurcation which may represent a small mesenteric cyst or low-density lymph node.  Pelvic images demonstrate mild bladder distention. Prostate and rectum are within normal. No free fluid.  There is a displaced intertrochanteric fracture of the right hip. There is a subtle nondisplaced fracture of the right inferior pubic ramus. Remaining bones soft tissues are unremarkable.  IMPRESSION: Displaced right intertrochanteric hip fracture. Nondisplaced fracture of the right inferior pubic ramus.  No acute findings within the chest. No acute intra-abdominal findings.  Minimal nonspecific increased number of small mesenteric lymph nodes. Recommend followup abdominal pelvic CT 3 months.  Minimal centrilobular emphysematous disease.  These results were called by telephone at the time of interpretation on 05/24/2014 at 5:28 pm to Dr. Ermalinda Memos, who verbally acknowledged these results.   Electronically Signed    By: Marin Olp M.D.   On: 05/24/2014 17:28   Ct Abdomen Pelvis W Contrast  05/24/2014   CLINICAL DATA:  Trauma with fall from ladder 6-8 feet. Unable to raise arms above head.  EXAM: CT CHEST, ABDOMEN, AND PELVIS WITH CONTRAST  TECHNIQUE: Multidetector CT imaging of the chest, abdomen and pelvis was performed following the standard protocol during bolus administration of intravenous contrast.  CONTRAST:  25mL OMNIPAQUE IOHEXOL 300 MG/ML  SOLN  COMPARISON:  CT chest 01/19/2014  FINDINGS: CT CHEST FINDINGS  The lungs are well inflated without consolidation or effusion. There is no pneumothorax. There is mild centrilobular emphysematous disease. Airways are within normal. Heart is normal in size. There is minimal calcified plaque over the thoracic aorta. Remaining mediastinal structures are unremarkable.  CT ABDOMEN AND PELVIS FINDINGS  The exam demonstrates a normal spleen, pancreas, gallbladder and adrenal glands. There is minimal stable atrophy over the anterior right lobe of the liver as a liver is otherwise unremarkable. Kidneys are within normal. There is calcified plaque over the abdominal aorta. There is no free fluid or free peritoneal air. Appendix is not seen. There is a slight increased number of small mesenteric lymph nodes. There is a 1.2 cm oval low-density focus within the mesentery of the mid abdomen left of midline just below the aortic bifurcation which may represent a small mesenteric cyst or low-density lymph node.  Pelvic images demonstrate mild bladder distention. Prostate and rectum are within normal. No free fluid.  There is a displaced intertrochanteric fracture of the right hip. There is a subtle nondisplaced fracture of the right inferior pubic ramus. Remaining bones soft tissues are unremarkable.  IMPRESSION: Displaced right intertrochanteric hip fracture. Nondisplaced fracture of the right inferior pubic ramus.  No acute findings within the chest. No acute intra-abdominal findings.   Minimal nonspecific increased number of small mesenteric lymph nodes. Recommend followup abdominal pelvic CT 3 months.  Minimal centrilobular emphysematous disease.  These results were called by telephone at the time of interpretation on 05/24/2014 at 5:28 pm to Dr. Ermalinda Memos, who verbally acknowledged these results.   Electronically Signed   By: Marin Olp M.D.   On: 05/24/2014 17:28   Dg Pelvis Portable  05/24/2014   CLINICAL DATA:  Patient fell 8 feet off a ladder with right hip region pain  EXAM: PORTABLE PELVIS 1-2 VIEWS  COMPARISON:  None.  FINDINGS: There is an intertrochanteric femur fracture on the right with mild varus angulation at the fracture site. Fracture extends into the greater trochanter region or fracture is  comminuted. No other fractures. No dislocation. Joint spaces appear intact.  IMPRESSION: Comminuted intertrochanteric femur fracture on the right with mild varus angulation at the fracture site. No dislocation seen on this single view.   Electronically Signed   By: Lowella Grip M.D.   On: 05/24/2014 15:03   Ct 3d Recon At Scanner  05/25/2014   CLINICAL DATA:  Nonspecific (abnormal) findings on radiological and other examination of musculoskeletal system. Status post fall from the ladder, 6-8 feet. Right intertrochanteric fracture secondary to the fall.  EXAM: 3-DIMENSIONAL CT IMAGE RENDERING ON ACQUISITION WORKSTATION  TECHNIQUE: 3-dimensional CT images were rendered by post-processing of the original CT data on an acquisition workstation. The 3-dimensional CT images were interpreted and findings were reported in the accompanying complete CT report for this study  COMPARISON:  CT chest, abdomen and pelvis 05/24/2014 at 16:51 hr  FINDINGS: As seen on the comparison examination, the patient has a right hip fracture. The fracture extends from the greater trochanter along the intertrochanteric ridge to the superior margin of the lesser trochanter. Nondisplaced right inferior pubic ramus  fracture seen on CT scan is not well demonstrated on these images.  IMPRESSION: Acute right intertrochanteric fracture.  Nondisplaced right inferior pubic ramus fracture is better demonstrated on the comparison exam.   Electronically Signed   By: Inge Rise M.D.   On: 05/25/2014 07:12   Dg Chest Portable 1 View  05/24/2014   CLINICAL DATA:  Fall 6-8 feet from ladder today. Generalized right pain. Initial encounter  EXAM: PORTABLE CHEST - 1 VIEW  COMPARISON:  CT 01/19/2014  FINDINGS: Mild hyperinflation/COPD. Lungs are clear. No pneumothorax or effusion. Heart is normal size. No visible rib fracture or other acute bony abnormality.  IMPRESSION: COPD.  No active disease.   Electronically Signed   By: Rolm Baptise M.D.   On: 05/24/2014 15:03   Dg C-arm 1-60 Min  05/24/2014   CLINICAL DATA:  Fracture the distal radius and ulna, closed. Initially counter. Fluoroscopy time 16.3 seconds.  EXAM: RIGHT WRIST - 2 VIEW; DG C-ARM 61-120 MIN  COMPARISON:  New 05/24/2014  FINDINGS: The patient has undergone screw plate fixation of the distal radius. Distal ulnar fracture is again noted.  IMPRESSION: Interval ORIF of the distal radius.   Electronically Signed   By: Shon Hale M.D.   On: 05/24/2014 22:48   Dg C-arm 1-60 Min  05/24/2014   CLINICAL DATA:  Right femoral intertrochanteric fracture.  EXAM: DG C-ARM 61-120 MIN; RIGHT FEMUR - 2 VIEW  COMPARISON:  CT 05/24/2014  FINDINGS: Intraoperative images demonstrate surgical fixation of the intertrochanteric fracture with a dynamic hip screw. Intramedullary nail extends into the distal femur. The right hip is located.  IMPRESSION: Internal fixation of the right femoral intertrochanteric fracture.   Electronically Signed   By: Markus Daft M.D.   On: 05/24/2014 22:43    Assessment/Plan: Diagnosis: right distal radius fx, right intertrochanteric fx after fall 1. Does the need for close, 24 hr/day medical supervision in concert with the patient's rehab needs make it  unreasonable for this patient to be served in a less intensive setting? Yes 2. Co-Morbidities requiring supervision/potential complications: copd, seizure disorder 3. Due to bladder management, bowel management, safety, skin/wound care, disease management, medication administration, pain management and patient education, does the patient require 24 hr/day rehab nursing? Yes 4. Does the patient require coordinated care of a physician, rehab nurse, PT (1-2 hrs/day, 5 days/week) and OT (1-2 hrs/day, 5 days/week) to address  physical and functional deficits in the context of the above medical diagnosis(es)? Yes Addressing deficits in the following areas: balance, endurance, locomotion, strength, transferring, bowel/bladder control, bathing, dressing, feeding, grooming, toileting and psychosocial support 5. Can the patient actively participate in an intensive therapy program of at least 3 hrs of therapy per day at least 5 days per week? Yes 6. The potential for patient to make measurable gains while on inpatient rehab is excellent 7. Anticipated functional outcomes upon discharge from inpatient rehab are modified independent and supervision  with PT, modified independent, supervision and min assist with OT, n/a with SLP. 8. Estimated rehab length of stay to reach the above functional goals is: 10-14 days 9. Does the patient have adequate social supports to accommodate these discharge functional goals? Yes 10. Anticipated D/C setting: Home 11. Anticipated post D/C treatments: HH therapy and Outpatient therapy 12. Overall Rehab/Functional Prognosis: excellent  RECOMMENDATIONS: This patient's condition is appropriate for continued rehabilitative care in the following setting: CIR Patient has agreed to participate in recommended program. Yes Note that insurance prior authorization may be required for reimbursement for recommended care.  Comment: Rehab Admissions Coordinator to follow  up.  Thanks,  [name], MD, Mellody Drown     05/26/2014

## 2014-05-26 NOTE — Care Management Note (Signed)
CARE MANAGEMENT NOTE 05/26/2014  Patient:  Joshua Reed, Joshua Reed   Account Number:  [id number]  Date Initiated:  05/26/2014  Documentation initiated by:  Ricki Miller  Subjective/Objective Assessment:   68 yr old male admitted with right hip fracture,right wrist fracture, patient had a right hip IM Nailing, Right wrist ORIF.     Action/Plan:   patient being evaluated for inpatient rehab. Case manager will continue to monitor.   Anticipated DC Date:     Anticipated DC Plan:  [place]  CM consult      Choice offered to / List presented to:             Status of service:  In process, will continue to follow Medicare Important Message given?   (If response is "NO", the following Medicare IM given date fields will be blank) Date Medicare IM given:   Medicare IM given by:   Date Additional Medicare IM given:   Additional Medicare IM given by:    Discharge Disposition:    Per UR Regulation:  Reviewed for med. necessity/level of care/duration of stay

## 2014-05-26 NOTE — Progress Notes (Signed)
PA on call for Dr. Erlinda Hong paged and received call from [place]. PA informed of pts critical lab value of hgb 6.5. PA also informed of Dr. Sloan Leiter concern of patient's continuing pain in pts right wrist. PA stated he would pass along information to Dr. Erlinda Hong . Nursing will continue to monitor.

## 2014-05-26 NOTE — Progress Notes (Signed)
CRITICAL VALUE ALERT  Critical value received:  Hgb 6.5  Date of notification:  05/26/2014   Time of notification:  0708  Critical value read back:Yes.    Nurse who received alert:  Leia Alf RN  MD notified (1st page):  Ghimire  Time of first page:  0710  MD notified (2nd page):  Time of second page:  Responding MD:  Sloan Leiter   Time MD responded:  [phone]

## 2014-05-26 NOTE — Progress Notes (Signed)
OT Cancellation Note  Patient Details Name: Joshua Reed MRN: [id number] DOB: [birth year]   Cancelled Treatment:       Janice Coffin 05/26/2014, 9:52 AM   Attempted skilled OT this am.  Pt. Asleep, wife present both request if therapy can come back early pm due to pt.very tired and requesting rest.  Will notify other OT staff to check back as time permits.   Romana Juniper, COTA/L

## 2014-05-26 NOTE — Progress Notes (Signed)
Orthopedic Tech Progress Note Patient Details:  Joshua Reed 1946-03-26 [id number]  Ortho Devices Type of Ortho Device: Ace wrap, Volar splint Ortho Device/Splint Location: rue Ortho Device/Splint Interventions: Application   [name] 05/26/2014, 12:11 PM

## 2014-05-26 NOTE — Plan of Care (Signed)
Problem: Consults Goal: Hip/Femur Fracture Patient Education See Patient Education Module for education specifics.  Outcome: Completed/Met Date Met:  05/26/14 Goal: Skin Care Protocol Initiated - if Braden Score 18 or less If consults are not indicated, leave blank or document N/A  Outcome: Completed/Met Date Met:  05/26/14 Goal: Nutrition Consult-if indicated Outcome: Completed/Met Date Met:  05/26/14 Goal: Diabetes Guidelines if Diabetic/Glucose > 140 If diabetic or lab glucose is > 140 mg/dl - Initiate Diabetes/Hyperglycemia Guidelines & Document Interventions  Outcome: Not Applicable Date Met:  99/23/41  Problem: Phase II Progression Outcomes Goal: CMS/Neurovascular status WDL Outcome: Completed/Met Date Met:  05/26/14 Goal: Tolerating diet Outcome: Completed/Met Date Met:  05/26/14

## 2014-05-26 NOTE — Progress Notes (Signed)
Received prescreen request for inpatient rehab and noted that rehab MD consult has already been placed. We will await completion of rehab MD consult and an admission coordinator will follow up after that.  Thanks.  Nanetta Batty, PT Rehabilitation Admissions Coordinator [phone]

## 2014-05-26 NOTE — Progress Notes (Signed)
Physical Therapy Treatment Patient Details Name: Joshua Reed MRN: [id number] DOB: 1946/03/21 Today's Date: 05/26/2014    History of Present Illness Joshua Reed. Joshua Reed s/p fall off of ladder at work resulting in R hip fx and R radial fx s/p R hip IM nailing and R ORIF to radius; WBAT on RLE and WB through platform on RUE    PT Comments    Pt. Educated on best technique to move himself toward EOB.  He continues to need assist to manage R LE and to bring upper body to sitting position at EOB.  He was not dizzy sitting at EOB but did have mild dizziness in standing.  One unit of blood has been transfused and he awaits second unit.  Pt. Stated that it did not hurt him much to mobilize with PT this session.  He appears comfortable in recliner chair.  Wife present and pleased with his progress.    Follow Up Recommendations  CIR;Supervision for mobility/OOB     Equipment Recommendations  Wheelchair (measurements PT);Wheelchair cushion (measurements PT);Rolling walker with 5" wheels;Other (comment) (R PFRW)    Recommendations for Other Services       Precautions / Restrictions Precautions Precautions: Fall Restrictions Weight Bearing Restrictions: Yes RUE Weight Bearing:  (weightbearing through platform RW) RLE Weight Bearing: Weight bearing as tolerated    Mobility  Bed Mobility Overal bed mobility: Needs Assistance Bed Mobility: Supine to Sit     Supine to sit: Mod assist     General bed mobility comments: Mod assist to manage R LE  and to assist at trunk for upright sitting  Transfers Overall transfer level: Needs assistance Equipment used: Right platform walker Transfers: Sit to/from Stand Sit to Stand: Mod assist;+2 physical assistance;From elevated surface         General transfer comment: mod assist to rise to stand from elevated bed  Ambulation/Gait Ambulation/Gait assistance: Mod assist;+2 safety/equipment Ambulation Distance (Feet): 4 Feet Assistive device:  Right platform walker Gait Pattern/deviations: Step-to pattern Gait velocity: decreased and moves cautiously   General Gait Details: Pt. needed increased time to shift weight in order to move conatralateral leg; able to take 4 steps before becoming mildly dizzy and being assisted to sit in recliner   Stairs            Wheelchair Mobility    Modified Rankin (Stroke Patients Only)       Balance                                    Cognition Arousal/Alertness: Awake/alert Behavior During Therapy: WFL for tasks assessed/performed Overall Cognitive Status: Within Functional Limits for tasks assessed                      Exercises General Exercises - Lower Extremity Ankle Circles/Pumps: AROM;Both;15 reps;Supine Quad Sets: AROM;Both;10 reps;Supine    General Comments        Pertinent Vitals/Pain Pain Assessment: 0-10 Pain Score: 0-No pain Pain Location: pt. denies pain at rest, however reports pain in wrist with mobility, not rated Pain Descriptors / Indicators: Aching;Tender Pain Intervention(s): Limited activity within patient's tolerance;Monitored during session;Repositioned;Premedicated before session;Other (comment) ( R UE elevated on 2 pillows)    Home Living                      Prior Function  PT Goals (current goals can now be found in the care plan section) Progress towards PT goals: Progressing toward goals    Frequency  Min 5X/week    PT Plan Current plan remains appropriate    Co-evaluation             End of Session Equipment Utilized During Treatment: Gait belt Activity Tolerance: Patient limited by pain;Patient limited by fatigue (limited by dizziness) Patient left: in chair;with call bell/phone within reach;with family/visitor present (wife and daughter present)     Time: 4982-6415 PT Time Calculation (min): 24 min  Charges:  $Gait Training: 8-22 mins $Therapeutic Activity: 8-22 mins                     G Codes:      Joshua Reed 05/26/2014, 2:20 PM Joshua Reed PT Acute Rehab Services [place] [phone]

## 2014-05-26 NOTE — Progress Notes (Signed)
   Subjective:  Patient reports pain as severe in right wrist.  Objective:   VITALS:   Filed Vitals:   05/26/14 0400 05/26/14 0500 05/26/14 0540 05/26/14 0755  BP:   113/60   Pulse:   85   Temp:   98.5 F (36.9 C)   TempSrc:      Resp: 16  16   Height:      Weight:  66.3 kg (146 lb 2.6 oz)    SpO2: 99%  96% 96%    Wrist incision c/d/i No signs of infection, hematoma Fingers wwp   Lab Results  Component Value Date   WBC 7.8 05/26/2014   HGB 6.5* 05/26/2014   HCT 19.1* 05/26/2014   MCV 85.7 05/26/2014   PLT 170 05/26/2014     Assessment/Plan:  2 Days Post-Op   - Expected postop acute blood loss anemia - will monitor for symptoms - Up with PT/OT - DVT ppx - SCDs, ambulation, lovenox - WBAT RLE - PFWB RUE - splint changed - toradol added for pain - elevate RUE at all times - pain in wrist is consistent with postop course  Marianna Payment 05/26/2014, 9:15 AM [phone]

## 2014-05-26 NOTE — Progress Notes (Signed)
Rehab admissions - I met with pt and his wife/grandson in follow up to rehab MD consult to explain the possibility of inpatient rehab. Pt was asleep during my discussion and wife preferred to let him keep resting. Wife is in support of pursuing inpatient rehab and shared that pt is being currently covered by worker's comp. Further questions were answered about our rehab program and informational brochures were given.   I spoke with [name], case manager and she shared contact information for pt's worker's comp case: case manager named [name], RN with [place] [employer] at [phone]. I called and left message with [name] giving her my contact information.   I will check on pt status on Monday and communicate with [name] also. We will consider possible inpatient rehab admission pending his medical clearance, authorization from worker's comp and our bed availability.  Wife was understanding of this and I stated I will check back with pt/family and give updates on Monday. Please call me with any questions. Thanks.  Nanetta Batty, PT Rehabilitation Admissions Coordinator [phone]

## 2014-05-26 NOTE — Plan of Care (Signed)
Problem: Phase II Progression Outcomes Goal: Bed to chair Outcome: Completed/Met Date Met:  05/26/14 Goal: Discharge plan established Outcome: Completed/Met Date Met:  05/26/14

## 2014-05-27 LAB — CBC
HCT: 19.1 % — ABNORMAL LOW (ref 39.0–52.0)
HCT: 23.8 % — ABNORMAL LOW (ref 39.0–52.0)
HEMOGLOBIN: 6.5 g/dL — AB (ref 13.0–17.0)
Hemoglobin: 8.1 g/dL — ABNORMAL LOW (ref 13.0–17.0)
MCH: 29.1 pg (ref 26.0–34.0)
MCH: 28.2 pg (ref 26.0–34.0)
MCHC: 34 g/dL (ref 30.0–36.0)
MCHC: 34 g/dL (ref 30.0–36.0)
MCV: 85.7 fL (ref 78.0–100.0)
MCV: 82.9 fL (ref 78.0–100.0)
Platelets: 170 10*3/uL (ref 150–400)
Platelets: 166 10*3/uL (ref 150–400)
RBC: 2.23 MIL/uL — ABNORMAL LOW (ref 4.22–5.81)
RBC: 2.87 MIL/uL — AB (ref 4.22–5.81)
RDW: 16 % — ABNORMAL HIGH (ref 11.5–15.5)
RDW: 16.3 % — ABNORMAL HIGH (ref 11.5–15.5)
WBC: 7.8 10*3/uL (ref 4.0–10.5)
WBC: 7.1 10*3/uL (ref 4.0–10.5)

## 2014-05-27 NOTE — Clinical Social Work Placement (Cosign Needed)
Clinical Social Work Department CLINICAL SOCIAL WORK PLACEMENT NOTE 05/27/2014  Patient:  CASPER, PAGLIUCA  Account Number:  [id number] Admit date:  05/24/2014  Clinical Social Worker:  Oretha Ellis, CLINICAL SOCIAL WORKER  Date/time:  05/27/2014 12:00 M  Clinical Social Work is seeking post-discharge placement for this patient at the following level of care:   SKILLED NURSING   (*CSW will update this form in Epic as items are completed)   05/27/2014  Patient/family provided with [place] Department of Clinical Social Work's list of facilities offering this level of care within the geographic area requested by the patient (or if unable, by the patient's family).  05/27/2014  Patient/family informed of their freedom to choose among providers that offer the needed level of care, that participate in Medicare, Medicaid or managed care program needed by the patient, have an available bed and are willing to accept the patient.  05/27/2014  Patient/family informed of [facility]' ownership interest in [facility], as well as of the fact that they are under no obligation to receive care at this facility.  PASARR submitted to EDS on 05/27/2014 PASARR number received on 05/27/2014  FL2 transmitted to all facilities in geographic area requested by pt/family on  05/27/2014 FL2 transmitted to all facilities within larger geographic area on   Patient informed that his/her managed care company has contracts with or will negotiate with  certain facilities, including the following:     Patient/family informed of bed offers received:   Patient chooses bed at  Physician recommends and patient chooses bed at    Patient to be transferred to  on   Patient to be transferred to facility by  Patient and family notified of transfer on  Name of family member notified:    The following physician request were entered in Epic:   Additional Comments: Worker's Comp coverage

## 2014-05-27 NOTE — Progress Notes (Signed)
Occupational Therapy Treatment Patient Details Name: Joshua Reed MRN: [id number] DOB: Mar 14, 1946 Today's Date: 05/27/2014    History of present illness Joshua Reed. [name] s/p fall off of ladder at work resulting in R hip fx and R radial fx s/p R hip IM nailing and R ORIF to radius; WBAT on RLE and WB through platform on RUE   OT comments  Making steady progress with mobility and ADL. Required min A with mobility @ platfrom walker level and mod A with LB ADL. Pain appears to be improving.  Reviewed need to keep RUE elevated and use ice and move digits frequently with pt/wife. Also encouraged use of incentive spirometer. Feel pt would benefit from CIR to maximize functional level of independence to return home with supportive wife. Will follow acutely to address established goals.   Follow Up Recommendations  CIR;Supervision/Assistance - 24 hour    Equipment Recommendations  3 in 1 bedside comode    Recommendations for Other Services Rehab consult    Precautions / Restrictions Precautions Precautions: Fall Restrictions Weight Bearing Restrictions: Yes RUE Weight Bearing: Weight bear through elbow only RLE Weight Bearing: Weight bearing as tolerated       Mobility Bed Mobility                  Transfers Overall transfer level: Needs assistance Equipment used: Right platform walker Transfers: Sit to/from Stand;Stand Pivot Transfers Sit to Stand: Min assist Stand pivot transfers: Min assist       General transfer comment: minA.Pt with heavy lean to L to offset hip pain, but controlled    Balance                                   ADL Overall ADL's : Needs assistance/impaired             Lower Body Bathing: Moderate assistance;Sit to/from stand Lower Body Bathing Details (indicate cue type and reason): Pt able to help with cleaning bottom in standing while maintaining balance                     Functional mobility during ADLs:  Minimal assistance;Rolling walker;Cueing for sequencing;Cueing for safety General ADL Comments: Making steady progress. Able to ambulate @ room with platform rw during ADL. Cues for sequencing [place] eof RW      Vision                     Perception     Praxis      Cognition   Behavior During Therapy: [facility] for tasks assessed/performed Overall Cognitive Status: Within Functional Limits for tasks assessed                       Extremity/Trunk Assessment               Exercises Other Exercises Other Exercises: RUE elbow and digit ROM.  Other Exercises: encourgared elevation of RW and ice of ice to R UE and R hip   Shoulder Instructions       General Comments      Pertinent Vitals/ Pain       Pain Assessment: 0-10 Pain Score: 5  Pain Location: R hip Pain Descriptors / Indicators: Aching Pain Intervention(s): Limited activity within patient's tolerance;Monitored during session;Repositioned;Ice applied  Home Living  Prior Functioning/Environment              Frequency Min 3X/week     Progress Toward Goals  OT Goals(current goals can now be found in the care plan section)  Progress towards OT goals: Progressing toward goals  Acute Rehab OT Goals Patient Stated Goal: to move better and have my arm hurt less OT Goal Formulation: With patient Time For Goal Achievement: 06/08/14 Potential to Achieve Goals: Good ADL Goals Pt Will Perform Grooming: with min assist;standing Pt Will Perform Upper Body Bathing: with set-up;with supervision;sitting Pt Will Perform Upper Body Dressing: with set-up;with supervision;sitting Pt Will Transfer to Toilet: with min guard assist;stand pivot transfer;bedside commode Pt/caregiver will Perform Home Exercise Program: Increased ROM;Right Upper extremity;Independently Additional ADL Goal #1: Pt will perform bed mobility with Supervision to prepare for ADLs.    Plan Discharge plan remains appropriate    Co-evaluation                 End of Session Equipment Utilized During Treatment: Gait belt;Rolling walker   Activity Tolerance Patient tolerated treatment well   Patient Left in chair;with call bell/phone within reach;with family/visitor present   Nurse Communication Mobility status        Time: 0920-1000 OT Time Calculation (min): 40 min  Charges: OT General Charges $OT Visit: 1 Procedure OT Treatments $Self Care/Home Management : 23-37 mins $Therapeutic Activity: 8-22 mins  [place]sey Ravenscroft,HILLARY 05/27/2014, 10:09 AM   Maurie Boettcher, OTR/L  [phone] 05/27/2014

## 2014-05-27 NOTE — Progress Notes (Signed)
PATIENT DETAILS Name: Joshua Reed Age: 68 y.o. Sex: male Date of Birth: [birth year] Admit Date: 05/24/2014 Admitting Physician Thurnell Lose, MD [id number],KDTOI Marigene Ehlers, MD  Subjective: Less pain the right hand today, ambulated some this am  Assessment/Plan: Principal Problem:   Hip fracture, right:secondary to a mechanical fall,underwent right hip repair with intramedullary implant on 11/4. Orthopedics following,Lovenox for DVT prophylaxis, weightbearing as tolerated. Suspect will need SNF vs CIR, social worker aware.Post perioperative course complicated by blood loss anemia, transfused 2 units PRBC on 05/26/14. Follow Hb.  Active Problems:   Right wrist fracture:secondary to mechanical fall, underwent open reduction and internal fixation on 11/4. Defer to orthopedics.   Anemia:likely secondary to perioperative blood loss, 2 units PRBC transfusion 11/6. Hb today 8.1, will continue to follow.   History of seizure disorder: change Dilantin to 200 mg BID. Obtain Dilantin level 16.7. Last seizure 1 year back.   COPD (chronic obstructive pulmonary disease):lungs clear.continue with Spiriva, and prn albuterol.   Remote Alcohol abuse:quit more than 4 years back, alcohol level <11 on admission. No signs of withdrawal.   Tobacco abuse: counseled, start transdermal nicotine.    Moderate protein calorie malnutrition: Continue supplements  Disposition: Remain inpatient-SNF vs CIR on discharge  Antibiotics:  None  DVT Prophylaxis: Prophylactic Lovenox   Code Status: Full code   Family Communication Spouse at bedside  Procedures:  Right hip/right wrist repair on 11/4  CONSULTS:  orthopedic surgery   MEDICATIONS: Scheduled Meds: . sodium chloride   Intravenous Once  . cholecalciferol  1,000 Units Oral Daily  . enoxaparin (LOVENOX) injection  40 mg Subcutaneous Q24H  . Influenza vac split quadrivalent PF  0.5 mL Intramuscular Tomorrow-1000  . lactose free  nutrition  237 mL Oral BID BM  . nicotine  21 mg Transdermal Daily  . phenytoin  200 mg Oral BID  . senna  1 tablet Oral BID  . tiotropium  18 mcg Inhalation Daily   Continuous Infusions:  PRN Meds:.acetaminophen **OR** acetaminophen, albuterol, alum & mag hydroxide-simeth, guaiFENesin-dextromethorphan, HYDROcodone-acetaminophen, ketorolac, menthol-cetylpyridinium **OR** phenol, methocarbamol **OR** methocarbamol (ROBAXIN)  IV, metoCLOPramide **OR** metoCLOPramide (REGLAN) injection, metoprolol, morphine injection, ondansetron **OR** ondansetron (ZOFRAN) IV, oxyCODONE, polyethylene glycol, sorbitol  Antibiotics: Anti-infectives    Start     Dose/Rate Route Frequency Ordered Stop   05/25/14 0200  ceFAZolin (ANCEF) IVPB 2 g/50 mL premix     2 g100 mL/hr over 30 Minutes Intravenous Every 6 hours 05/24/14 2339 05/25/14 1630       PHYSICAL EXAM: Vital signs in last 24 hours: Filed Vitals:   05/27/14 0400 05/27/14 0531 05/27/14 0634 05/27/14 0842  BP:  110/55    Pulse:  86    Temp:  99.2 F (37.3 C)    TempSrc:      Resp: 18 18    Height:      Weight:   65.6 kg (144 lb 10 oz)   SpO2: 98% 98%  95%    Weight change: -0.7 kg (-1 lb 8.7 oz) Filed Weights   05/25/14 0654 05/26/14 0500 05/27/14 0634  Weight: 68.3 kg (150 lb 9.2 oz) 66.3 kg (146 lb 2.6 oz) 65.6 kg (144 lb 10 oz)   Body mass index is 22.65 kg/(m^2).   Gen Exam: Awake and alert with clear speech.   Neck: Supple, No JVD.   Chest: B/L Clear. No rhonchi CVS: S1 S2 Regular, no murmurs.  Abdomen: soft, BS +, non tender, non distended.  Extremities: no edema, lower extremities warm to touch.Right finger tips-good capillary refill. Neurologic: Non Focal.  Skin: No Rash.   Wounds: N/A.    Intake/Output from previous day:  Intake/Output Summary (Last 24 hours) at 05/27/14 1106 Last data filed at 05/26/14 2057  Gross per 24 hour  Intake    870 ml  Output    400 ml  Net    470 ml     LAB RESULTS: CBC  Recent  Labs Lab 05/24/14 1614 05/24/14 1624 05/25/14 0025 05/26/14 0600 05/26/14 2219 05/27/14 0509  WBC 10.8*  --  8.7 7.8 7.3 7.1  HGB 10.4* 11.6* 9.2* 6.5* 8.3* 8.1*  HCT 31.9* 34.0* 28.2* 19.1* 24.5* 23.8*  PLT 220  --  227 170 162 166  MCV 85.1  --  86.8 85.7 82.2 82.9  MCH 27.7  --  28.3 29.1 27.9 28.2  MCHC 32.6  --  32.6 34.0 33.9 34.0  RDW 15.7*  --  15.9* 16.0* 16.1* 16.3*    Chemistries   Recent Labs Lab 05/24/14 1614 05/24/14 1624 05/25/14 0025  NA 136* 135* 135*  K 4.8 4.4 4.7  CL 101 100 99  CO2 23  --  26  GLUCOSE 98 100* 180*  BUN 12 11 10   CREATININE 0.77 0.80 0.67  CALCIUM 7.8*  --  7.7*    CBG:  Recent Labs Lab 05/26/14 2204  GLUCAP 162*    GFR Estimated Creatinine Clearance: 83.1 mL/min (by C-G formula based on Cr of 0.67).  Coagulation profile  Recent Labs Lab 05/24/14 1614 05/25/14 0025  INR 1.23 1.31    Cardiac Enzymes No results for input(s): CKMB, TROPONINI, MYOGLOBIN in the last 168 hours.  Invalid input(s): CK  Invalid input(s): POCBNP No results for input(s): DDIMER in the last 72 hours. No results for input(s): HGBA1C in the last 72 hours. No results for input(s): CHOL, HDL, LDLCALC, TRIG, CHOLHDL, LDLDIRECT in the last 72 hours. No results for input(s): TSH, T4TOTAL, T3FREE, THYROIDAB in the last 72 hours.  Invalid input(s): FREET3 No results for input(s): VITAMINB12, FOLATE, FERRITIN, TIBC, IRON, RETICCTPCT in the last 72 hours. No results for input(s): LIPASE, AMYLASE in the last 72 hours.  Urine Studies No results for input(s): UHGB, CRYS in the last 72 hours.  Invalid input(s): UACOL, UAPR, USPG, UPH, UTP, UGL, UKET, UBIL, UNIT, UROB, ULEU, UEPI, UWBC, URBC, UBAC, CAST, UCOM, BILUA  MICROBIOLOGY: No results found for this or any previous visit (from the past 240 hour(s)).  RADIOLOGY STUDIES/RESULTS: Dg Wrist 2 Views Right  05/24/2014   CLINICAL DATA:  Fracture the distal radius and ulna, closed. Initially  counter. Fluoroscopy time 16.3 seconds.  EXAM: RIGHT WRIST - 2 VIEW; DG C-ARM 61-120 MIN  COMPARISON:  New 05/24/2014  FINDINGS: The patient has undergone screw plate fixation of the distal radius. Distal ulnar fracture is again noted.  IMPRESSION: Interval ORIF of the distal radius.   Electronically Signed   By: Shon Hale M.D.   On: 05/24/2014 22:48   Dg Wrist 2 Views Right  05/24/2014   CLINICAL DATA:  Post reduction wrist fracture.  EXAM: RIGHT WRIST - 2 VIEW  COMPARISON:  05/24/2014  FINDINGS: Wrist is imaged through splinting material. There are comminuted fractures of the distal radius and ulna. There has been interval reduction with significant improvement in the angulation of the radiocarpal joint. Intercarpal spaces are grossly normal. Nondisplaced carpal fractures would be difficult to exclude but are not identified.  IMPRESSION: 1. Interval  reduction of wrist fractures. 2. Significantly improved radiocarpal angulation.   Electronically Signed   By: Shon Hale M.D.   On: 05/24/2014 18:41   Dg Wrist 2 Views Right  05/24/2014   CLINICAL DATA:  [place] off ladder today from 10 feet. Deformity of right wrist. Right wrist pain. Initial encounter.  EXAM: RIGHT WRIST - 2 VIEW  COMPARISON:  None.  FINDINGS: Study is limited due to patient condition and limited positioning. Comminuted fracture noted through the distal right radius with impaction. No definite dislocation. Ulnar styloid fracture. Diffuse soft tissue swelling noted.  IMPRESSION: Comminuted, impacted distal right radial fracture. Distal ulnar/ulnar styloid fracture.   Electronically Signed   By: Rolm Baptise M.D.   On: 05/24/2014 15:49   Dg Femur Right  05/24/2014   CLINICAL DATA:  Right femoral intertrochanteric fracture.  EXAM: DG C-ARM 61-120 MIN; RIGHT FEMUR - 2 VIEW  COMPARISON:  CT 05/24/2014  FINDINGS: Intraoperative images demonstrate surgical fixation of the intertrochanteric fracture with a dynamic hip screw. Intramedullary nail  extends into the distal femur. The right hip is located.  IMPRESSION: Internal fixation of the right femoral intertrochanteric fracture.   Electronically Signed   By: Markus Daft M.D.   On: 05/24/2014 22:43   Ct Chest W Contrast  05/24/2014   CLINICAL DATA:  Trauma with fall from ladder 6-8 feet. Unable to raise arms above head.  EXAM: CT CHEST, ABDOMEN, AND PELVIS WITH CONTRAST  TECHNIQUE: Multidetector CT imaging of the chest, abdomen and pelvis was performed following the standard protocol during bolus administration of intravenous contrast.  CONTRAST:  42mL OMNIPAQUE IOHEXOL 300 MG/ML  SOLN  COMPARISON:  CT chest 01/19/2014  FINDINGS: CT CHEST FINDINGS  The lungs are well inflated without consolidation or effusion. There is no pneumothorax. There is mild centrilobular emphysematous disease. Airways are within normal. Heart is normal in size. There is minimal calcified plaque over the thoracic aorta. Remaining mediastinal structures are unremarkable.  CT ABDOMEN AND PELVIS FINDINGS  The exam demonstrates a normal spleen, pancreas, gallbladder and adrenal glands. There is minimal stable atrophy over the anterior right lobe of the liver as a liver is otherwise unremarkable. Kidneys are within normal. There is calcified plaque over the abdominal aorta. There is no free fluid or free peritoneal air. Appendix is not seen. There is a slight increased number of small mesenteric lymph nodes. There is a 1.2 cm oval low-density focus within the mesentery of the mid abdomen left of midline just below the aortic bifurcation which may represent a small mesenteric cyst or low-density lymph node.  Pelvic images demonstrate mild bladder distention. Prostate and rectum are within normal. No free fluid.  There is a displaced intertrochanteric fracture of the right hip. There is a subtle nondisplaced fracture of the right inferior pubic ramus. Remaining bones soft tissues are unremarkable.  IMPRESSION: Displaced right  intertrochanteric hip fracture. Nondisplaced fracture of the right inferior pubic ramus.  No acute findings within the chest. No acute intra-abdominal findings.  Minimal nonspecific increased number of small mesenteric lymph nodes. Recommend followup abdominal pelvic CT 3 months.  Minimal centrilobular emphysematous disease.  These results were called by telephone at the time of interpretation on 05/24/2014 at 5:28 pm to Dr. Ermalinda Memos, who verbally acknowledged these results.   Electronically Signed   By: Marin Olp M.D.   On: 05/24/2014 17:28   Ct Abdomen Pelvis W Contrast  05/24/2014   CLINICAL DATA:  Trauma with fall from ladder 6-8 feet. Unable  to raise arms above head.  EXAM: CT CHEST, ABDOMEN, AND PELVIS WITH CONTRAST  TECHNIQUE: Multidetector CT imaging of the chest, abdomen and pelvis was performed following the standard protocol during bolus administration of intravenous contrast.  CONTRAST:  54mL OMNIPAQUE IOHEXOL 300 MG/ML  SOLN  COMPARISON:  CT chest 01/19/2014  FINDINGS: CT CHEST FINDINGS  The lungs are well inflated without consolidation or effusion. There is no pneumothorax. There is mild centrilobular emphysematous disease. Airways are within normal. Heart is normal in size. There is minimal calcified plaque over the thoracic aorta. Remaining mediastinal structures are unremarkable.  CT ABDOMEN AND PELVIS FINDINGS  The exam demonstrates a normal spleen, pancreas, gallbladder and adrenal glands. There is minimal stable atrophy over the anterior right lobe of the liver as a liver is otherwise unremarkable. Kidneys are within normal. There is calcified plaque over the abdominal aorta. There is no free fluid or free peritoneal air. Appendix is not seen. There is a slight increased number of small mesenteric lymph nodes. There is a 1.2 cm oval low-density focus within the mesentery of the mid abdomen left of midline just below the aortic bifurcation which may represent a small mesenteric cyst or  low-density lymph node.  Pelvic images demonstrate mild bladder distention. Prostate and rectum are within normal. No free fluid.  There is a displaced intertrochanteric fracture of the right hip. There is a subtle nondisplaced fracture of the right inferior pubic ramus. Remaining bones soft tissues are unremarkable.  IMPRESSION: Displaced right intertrochanteric hip fracture. Nondisplaced fracture of the right inferior pubic ramus.  No acute findings within the chest. No acute intra-abdominal findings.  Minimal nonspecific increased number of small mesenteric lymph nodes. Recommend followup abdominal pelvic CT 3 months.  Minimal centrilobular emphysematous disease.  These results were called by telephone at the time of interpretation on 05/24/2014 at 5:28 pm to Dr. Ermalinda Memos, who verbally acknowledged these results.   Electronically Signed   By: Marin Olp M.D.   On: 05/24/2014 17:28   Dg Pelvis Portable  05/24/2014   CLINICAL DATA:  Patient fell 8 feet off a ladder with right hip region pain  EXAM: PORTABLE PELVIS 1-2 VIEWS  COMPARISON:  None.  FINDINGS: There is an intertrochanteric femur fracture on the right with mild varus angulation at the fracture site. Fracture extends into the greater trochanter region or fracture is comminuted. No other fractures. No dislocation. Joint spaces appear intact.  IMPRESSION: Comminuted intertrochanteric femur fracture on the right with mild varus angulation at the fracture site. No dislocation seen on this single view.   Electronically Signed   By: Lowella Grip M.D.   On: 05/24/2014 15:03   Ct 3d Recon At Scanner  05/25/2014   CLINICAL DATA:  Nonspecific (abnormal) findings on radiological and other examination of musculoskeletal system. Status post fall from the ladder, 6-8 feet. Right intertrochanteric fracture secondary to the fall.  EXAM: 3-DIMENSIONAL CT IMAGE RENDERING ON ACQUISITION WORKSTATION  TECHNIQUE: 3-dimensional CT images were rendered by  post-processing of the original CT data on an acquisition workstation. The 3-dimensional CT images were interpreted and findings were reported in the accompanying complete CT report for this study  COMPARISON:  CT chest, abdomen and pelvis 05/24/2014 at 16:51 hr  FINDINGS: As seen on the comparison examination, the patient has a right hip fracture. The fracture extends from the greater trochanter along the intertrochanteric ridge to the superior margin of the lesser trochanter. Nondisplaced right inferior pubic ramus fracture seen on CT scan is  not well demonstrated on these images.  IMPRESSION: Acute right intertrochanteric fracture.  Nondisplaced right inferior pubic ramus fracture is better demonstrated on the comparison exam.   Electronically Signed   By: Inge Rise M.D.   On: 05/25/2014 07:12   Dg Chest Portable 1 View  05/24/2014   CLINICAL DATA:  Fall 6-8 feet from ladder today. Generalized right pain. Initial encounter  EXAM: PORTABLE CHEST - 1 VIEW  COMPARISON:  CT 01/19/2014  FINDINGS: Mild hyperinflation/COPD. Lungs are clear. No pneumothorax or effusion. Heart is normal size. No visible rib fracture or other acute bony abnormality.  IMPRESSION: COPD.  No active disease.   Electronically Signed   By: Rolm Baptise M.D.   On: 05/24/2014 15:03   Dg C-arm 1-60 Min  05/24/2014   CLINICAL DATA:  Fracture the distal radius and ulna, closed. Initially counter. Fluoroscopy time 16.3 seconds.  EXAM: RIGHT WRIST - 2 VIEW; DG C-ARM 61-120 MIN  COMPARISON:  New 05/24/2014  FINDINGS: The patient has undergone screw plate fixation of the distal radius. Distal ulnar fracture is again noted.  IMPRESSION: Interval ORIF of the distal radius.   Electronically Signed   By: Shon Hale M.D.   On: 05/24/2014 22:48   Dg C-arm 1-60 Min  05/24/2014   CLINICAL DATA:  Right femoral intertrochanteric fracture.  EXAM: DG C-ARM 61-120 MIN; RIGHT FEMUR - 2 VIEW  COMPARISON:  CT 05/24/2014  FINDINGS: Intraoperative images  demonstrate surgical fixation of the intertrochanteric fracture with a dynamic hip screw. Intramedullary nail extends into the distal femur. The right hip is located.  IMPRESSION: Internal fixation of the right femoral intertrochanteric fracture.   Electronically Signed   By: Markus Daft M.D.   On: 05/24/2014 22:43    Oren Binet, MD  [facility] Pager:[phone]  If 7PM-7AM, please contact night-coverage [email or web address] Password [email or web address] 05/27/2014, 11:06 AM   LOS: 3 days

## 2014-05-27 NOTE — Clinical Social Work Note (Cosign Needed)
Clinical Social Work Department BRIEF PSYCHOSOCIAL ASSESSMENT 05/27/2014  Patient:  Joshua Reed, Joshua Reed     Account Number:  [id number]     Admit date:  05/24/2014  Clinical Social Worker:  Oretha Ellis, CLINICAL SOCIAL WORKER  Date/Time:  05/27/2014 12:00 N  Referred by:  Care Management  Date Referred:  05/27/2014 Referred for  SNF Placement   Other Referral:   Interview type:  Patient Other interview type:    PSYCHOSOCIAL DATA Living Status:  WIFE Admitted from facility:   Level of care:   Primary support name:  Gerhart Ruggieri Primary support relationship to patient:  SPOUSE Degree of support available:    CURRENT CONCERNS Current Concerns  Post-Acute Placement   Other Concerns:    SOCIAL WORK ASSESSMENT / PLAN CSW Intern spoke with patient about possible SNF placement, if inpatient rehab not available. CSW Intern assured patient that plan for SNF is only back up plan as we are aware patient prefers inpatient rehab.  Patient agreed for CSW Intern to initiate bed search for [facility] facilities. CSW to complete FL-2 and initiate SNF search for bed offers. CSW to follow up with patient to provide available bed offers. CSW remains available for support and to facilitate patient discharge need once medically ready.   Assessment/plan status:  Psychosocial Support/Ongoing Assessment of Needs Other assessment/ plan:   Information/referral to community resources:   CSW intern to provide patient with facility ist once bed offers are available.    PATIENT'S/FAMILY'S RESPONSE TO PLAN OF CARE: Patient alert and orientedx3 in the bed. No family or friends currently present at bedside. Patient engaged in [place] Intern assessment and agreeable with discharge plan. Patient understanding of Social Work role and appreciative of support.

## 2014-05-27 NOTE — Progress Notes (Signed)
Physical Therapy Treatment Patient Details Name: Joshua Reed MRN: [id number] DOB: [birth year] Today's Date: 05/27/2014    History of Present Illness Joshua Reed. Joshua Reed s/p fall off of ladder at work resulting in R hip fx and R radial fx s/p R hip IM nailing and R ORIF to radius; WBAT on RLE and WB through platform on RUE.  Pt. transfused 2 units of blood on 05/26/14    PT Comments    Pt. With hgb today of 8.2 .  Fatigued early but not dizzy like yesterday.  Making progress and should do well with a CIR stay.  Able to ambulate 10' with mod assist of one and second person for bringing recliner chair.    Follow Up Recommendations  CIR;Supervision for mobility/OOB     Equipment Recommendations  Wheelchair (measurements PT);Wheelchair cushion (measurements PT);Rolling walker with 5" wheels;Other (comment)    Recommendations for Other Services       Precautions / Restrictions Precautions Precautions: Fall Restrictions Weight Bearing Restrictions: Yes RUE Weight Bearing: Weight bear through elbow only RLE Weight Bearing: Weight bearing as tolerated    Mobility  Bed Mobility Overal bed mobility: Needs Assistance Bed Mobility: Supine to Sit     Supine to sit: +2 for physical assistance;Min assist     General bed mobility comments: min assist for upper body and second person to assist R LE  Transfers Overall transfer level: Needs assistance Equipment used: Right platform walker Transfers: Sit to/from Stand Sit to Stand: Min assist;+2 safety/equipment Stand pivot transfers: Min assist       General transfer comment: min assist to rise mostly on left LE and for balance; second person for safety to assist with equipment  Ambulation/Gait Ambulation/Gait assistance: Mod assist;+2 safety/equipment Ambulation Distance (Feet): 10 Feet Assistive device: Right platform walker Gait Pattern/deviations: Step-to pattern Gait velocity: decreased    General Gait Details: Pt. unable  to advance RW and needed mod assist to do so; moving R LE slightly better today.  Denies dizziness, fatigues early   Stairs            Wheelchair Mobility    Modified Rankin (Stroke Patients Only)       Balance                                    Cognition Arousal/Alertness: Awake/alert Behavior During Therapy: WFL for tasks assessed/performed Overall Cognitive Status: Within Functional Limits for tasks assessed                      Exercises General Exercises - Lower Extremity Ankle Circles/Pumps: AROM;Both;15 reps;Supine Quad Sets: AROM;Both;10 reps;Supine Long Arc Quad: AROM;Right;5 reps;Seated Other Exercises Other Exercises: RUE elbow and digit ROM.  Other Exercises: encourgared elevation of RW and ice of ice to R UE and R hip    General Comments        Pertinent Vitals/Pain Pain Assessment: 0-10 Pain Score: 5  Pain Location: right arm and hip Pain Descriptors / Indicators: Aching Pain Intervention(s): Limited activity within patient's tolerance;Monitored during session;Repositioned;Other (comment) (pt. declines pain med at this time)    Home Living                      Prior Function            PT Goals (current goals can now be found in the care plan section) Acute Rehab  PT Goals Patient Stated Goal: to move better and have my arm hurt less Progress towards PT goals: Progressing toward goals    Frequency  Min 5X/week    PT Plan Current plan remains appropriate    Co-evaluation             End of Session Equipment Utilized During Treatment: Gait belt Activity Tolerance: Patient limited by fatigue Patient left: in chair;with call bell/phone within reach     Time: 0804-0830 PT Time Calculation (min): 26 min  Charges:                       G Codes:      Ladona Ridgel 05/27/2014, 1:51 PM Gerlean Ren PT Acute Rehab Services [place] [phone]

## 2014-05-28 DIAGNOSIS — F1010 Alcohol abuse, uncomplicated: Secondary | ICD-10-CM

## 2014-05-28 LAB — BASIC METABOLIC PANEL
Anion gap: 11 (ref 5–15)
BUN: 10 mg/dL (ref 6–23)
CO2: 25 mEq/L (ref 19–32)
Calcium: 7.8 mg/dL — ABNORMAL LOW (ref 8.4–10.5)
Chloride: 99 mEq/L (ref 96–112)
Creatinine, Ser: 0.65 mg/dL (ref 0.50–1.35)
GFR calc Af Amer: >90 mL/min (ref >90)
Glucose, Bld: 110 mg/dL — ABNORMAL HIGH (ref 70–99)
POTASSIUM: 4 meq/L (ref 3.7–5.3)
Sodium: 135 mEq/L — ABNORMAL LOW (ref 137–147)

## 2014-05-28 LAB — CBC
HEMATOCRIT: 23.7 % — AB (ref 39.0–52.0)
HEMOGLOBIN: 7.9 g/dL — AB (ref 13.0–17.0)
MCH: 28 pg (ref 26.0–34.0)
MCHC: 33.3 g/dL (ref 30.0–36.0)
MCV: 84 fL (ref 78.0–100.0)
Platelets: 175 10*3/uL (ref 150–400)
RBC: 2.82 MIL/uL — ABNORMAL LOW (ref 4.22–5.81)
RDW: 16.1 % — ABNORMAL HIGH (ref 11.5–15.5)
WBC: 5.3 10*3/uL (ref 4.0–10.5)

## 2014-05-28 NOTE — Progress Notes (Signed)
CSW Intern Alena Bills McLean's notes co-signed by Renita Papa LCSW

## 2014-05-28 NOTE — Progress Notes (Signed)
PATIENT DETAILS Name: Joshua Reed Age: 68 y.o. Sex: male Date of Birth: Feb 09, 1946 Admit Date: 05/24/2014 Admitting Physician Thurnell Lose, MD HBZ:[id number],[id number] Marigene Ehlers, MD  Subjective: Pain at right hand/hip slowly improving.  Assessment/Plan: Principal Problem:   Hip fracture, right:secondary to a mechanical fall,underwent right hip repair with intramedullary implant on 11/4. Orthopedics following,Lovenox for DVT prophylaxis, weightbearing as tolerated. Suspect will need SNF vs CIR, social worker aware.Post perioperative course complicated by blood loss anemia, transfused 2 units PRBC on 05/26/14. Follow Hb.  Active Problems:   Right wrist fracture:secondary to mechanical fall, underwent open reduction and internal fixation on 11/4. Defer to orthopedics.   Anemia:likely secondary to perioperative blood loss, 2 units PRBC transfusion 11/6. Hb today 7.9, will continue to follow.Transfuse prn.   History of seizure disorder: change Dilantin to 200 mg BID. Obtain Dilantin level 16.7. Last seizure 1 year back.   COPD (chronic obstructive pulmonary disease):lungs clear.continue with Spiriva, and prn albuterol.   Remote Alcohol abuse:quit more than 4 years back, alcohol level <11 on admission. No signs of withdrawal.   Tobacco abuse: counseled, start transdermal nicotine.    Moderate protein calorie malnutrition: Continue supplements  Disposition: Remain inpatient-SNF vs CIR on discharge  Antibiotics:  None  DVT Prophylaxis: Prophylactic Lovenox   Code Status: Full code   Family Communication Spouse at bedside  Procedures:  Right hip/right wrist repair on 11/4  CONSULTS:  orthopedic surgery   MEDICATIONS: Scheduled Meds: . sodium chloride   Intravenous Once  . cholecalciferol  1,000 Units Oral Daily  . enoxaparin (LOVENOX) injection  40 mg Subcutaneous Q24H  . Influenza vac split quadrivalent PF  0.5 mL Intramuscular Tomorrow-1000  . lactose free  nutrition  237 mL Oral BID BM  . nicotine  21 mg Transdermal Daily  . phenytoin  200 mg Oral BID  . senna  1 tablet Oral BID  . tiotropium  18 mcg Inhalation Daily   Continuous Infusions:  PRN Meds:.acetaminophen **OR** acetaminophen, albuterol, alum & mag hydroxide-simeth, guaiFENesin-dextromethorphan, HYDROcodone-acetaminophen, menthol-cetylpyridinium **OR** phenol, methocarbamol **OR** methocarbamol (ROBAXIN)  IV, metoCLOPramide **OR** metoCLOPramide (REGLAN) injection, metoprolol, morphine injection, ondansetron **OR** ondansetron (ZOFRAN) IV, oxyCODONE, polyethylene glycol, sorbitol  Antibiotics: Anti-infectives    Start     Dose/Rate Route Frequency Ordered Stop   05/25/14 0200  ceFAZolin (ANCEF) IVPB 2 g/50 mL premix     2 g100 mL/hr over 30 Minutes Intravenous Every 6 hours 05/24/14 2339 05/25/14 1630       PHYSICAL EXAM: Vital signs in last 24 hours: Filed Vitals:   05/28/14 0000 05/28/14 0400 05/28/14 0542 05/28/14 0800  BP:   100/51   Pulse:   70   Temp:   97.8 F (36.6 C)   TempSrc:   Oral   Resp: 17 17 18 18   Height:      Weight:      SpO2: 98% 99% 98%     Weight change:  Filed Weights   05/25/14 0654 05/26/14 0500 05/27/14 0634  Weight: 68.3 kg (150 lb 9.2 oz) 66.3 kg (146 lb 2.6 oz) 65.6 kg (144 lb 10 oz)   Body mass index is 22.65 kg/(m^2).   Gen Exam: Awake and alert with clear speech.  Not in any distress Neck: Supple, No JVD.   Chest: B/L Clear. No rhonchi/rales CVS: S1 S2 Regular, no murmurs.  Abdomen: soft, BS +, non tender, non distended.  Extremities: no edema, lower extremities warm to touch.Right finger tips-good capillary  refill. Neurologic: Non Focal.  Skin: No Rash.   Wounds: N/A.    Intake/Output from previous day:  Intake/Output Summary (Last 24 hours) at 05/28/14 1119 Last data filed at 05/28/14 0541  Gross per 24 hour  Intake    460 ml  Output    925 ml  Net   -465 ml     LAB RESULTS: CBC  Recent Labs Lab 05/25/14 0025  05/26/14 0600 05/26/14 2219 05/27/14 0509 05/28/14 0515  WBC 8.7 7.8 7.3 7.1 5.3  HGB 9.2* 6.5* 8.3* 8.1* 7.9*  HCT 28.2* 19.1* 24.5* 23.8* 23.7*  PLT 227 170 162 166 175  MCV 86.8 85.7 82.2 82.9 84.0  MCH 28.3 29.1 27.9 28.2 28.0  MCHC 32.6 34.0 33.9 34.0 33.3  RDW 15.9* 16.0* 16.1* 16.3* 16.1*    Chemistries   Recent Labs Lab 05/24/14 1614 05/24/14 1624 05/25/14 0025 05/28/14 0515  NA 136* 135* 135* 135*  K 4.8 4.4 4.7 4.0  CL 101 100 99 99  CO2 23  --  26 25  GLUCOSE 98 100* 180* 110*  BUN 12 11 10 10   CREATININE 0.77 0.80 0.67 0.65  CALCIUM 7.8*  --  7.7* 7.8*    CBG:  Recent Labs Lab 05/26/14 2204  GLUCAP 162*    GFR Estimated Creatinine Clearance: 83.1 mL/min (by C-G formula based on Cr of 0.65).  Coagulation profile  Recent Labs Lab 05/24/14 1614 05/25/14 0025  INR 1.23 1.31    Cardiac Enzymes No results for input(s): CKMB, TROPONINI, MYOGLOBIN in the last 168 hours.  Invalid input(s): CK  Invalid input(s): POCBNP No results for input(s): DDIMER in the last 72 hours. No results for input(s): HGBA1C in the last 72 hours. No results for input(s): CHOL, HDL, LDLCALC, TRIG, CHOLHDL, LDLDIRECT in the last 72 hours. No results for input(s): TSH, T4TOTAL, T3FREE, THYROIDAB in the last 72 hours.  Invalid input(s): FREET3 No results for input(s): VITAMINB12, FOLATE, FERRITIN, TIBC, IRON, RETICCTPCT in the last 72 hours. No results for input(s): LIPASE, AMYLASE in the last 72 hours.  Urine Studies No results for input(s): UHGB, CRYS in the last 72 hours.  Invalid input(s): UACOL, UAPR, USPG, UPH, UTP, UGL, UKET, UBIL, UNIT, UROB, ULEU, UEPI, UWBC, URBC, UBAC, CAST, UCOM, BILUA  MICROBIOLOGY: No results found for this or any previous visit (from the past 240 hour(s)).  RADIOLOGY STUDIES/RESULTS: Dg Wrist 2 Views Right  05/24/2014   CLINICAL DATA:  Fracture the distal radius and ulna, closed. Initially counter. Fluoroscopy time 16.3 seconds.   EXAM: RIGHT WRIST - 2 VIEW; DG C-ARM 61-120 MIN  COMPARISON:  New 05/24/2014  FINDINGS: The patient has undergone screw plate fixation of the distal radius. Distal ulnar fracture is again noted.  IMPRESSION: Interval ORIF of the distal radius.   Electronically Signed   By: Shon Hale M.D.   On: 05/24/2014 22:48   Dg Wrist 2 Views Right  05/24/2014   CLINICAL DATA:  Post reduction wrist fracture.  EXAM: RIGHT WRIST - 2 VIEW  COMPARISON:  05/24/2014  FINDINGS: Wrist is imaged through splinting material. There are comminuted fractures of the distal radius and ulna. There has been interval reduction with significant improvement in the angulation of the radiocarpal joint. Intercarpal spaces are grossly normal. Nondisplaced carpal fractures would be difficult to exclude but are not identified.  IMPRESSION: 1. Interval reduction of wrist fractures. 2. Significantly improved radiocarpal angulation.   Electronically Signed   By: Shon Hale M.D.   On: 05/24/2014  18:41   Dg Wrist 2 Views Right  05/24/2014   CLINICAL DATA:  [place] off ladder today from 10 feet. Deformity of right wrist. Right wrist pain. Initial encounter.  EXAM: RIGHT WRIST - 2 VIEW  COMPARISON:  None.  FINDINGS: Study is limited due to patient condition and limited positioning. Comminuted fracture noted through the distal right radius with impaction. No definite dislocation. Ulnar styloid fracture. Diffuse soft tissue swelling noted.  IMPRESSION: Comminuted, impacted distal right radial fracture. Distal ulnar/ulnar styloid fracture.   Electronically Signed   By: Rolm Baptise M.D.   On: 05/24/2014 15:49   Dg Femur Right  05/24/2014   CLINICAL DATA:  Right femoral intertrochanteric fracture.  EXAM: DG C-ARM 61-120 MIN; RIGHT FEMUR - 2 VIEW  COMPARISON:  CT 05/24/2014  FINDINGS: Intraoperative images demonstrate surgical fixation of the intertrochanteric fracture with a dynamic hip screw. Intramedullary nail extends into the distal femur. The right hip  is located.  IMPRESSION: Internal fixation of the right femoral intertrochanteric fracture.   Electronically Signed   By: Markus Daft M.D.   On: 05/24/2014 22:43   Ct Chest W Contrast  05/24/2014   CLINICAL DATA:  Trauma with fall from ladder 6-8 feet. Unable to raise arms above head.  EXAM: CT CHEST, ABDOMEN, AND PELVIS WITH CONTRAST  TECHNIQUE: Multidetector CT imaging of the chest, abdomen and pelvis was performed following the standard protocol during bolus administration of intravenous contrast.  CONTRAST:  76mL OMNIPAQUE IOHEXOL 300 MG/ML  SOLN  COMPARISON:  CT chest 01/19/2014  FINDINGS: CT CHEST FINDINGS  The lungs are well inflated without consolidation or effusion. There is no pneumothorax. There is mild centrilobular emphysematous disease. Airways are within normal. Heart is normal in size. There is minimal calcified plaque over the thoracic aorta. Remaining mediastinal structures are unremarkable.  CT ABDOMEN AND PELVIS FINDINGS  The exam demonstrates a normal spleen, pancreas, gallbladder and adrenal glands. There is minimal stable atrophy over the anterior right lobe of the liver as a liver is otherwise unremarkable. Kidneys are within normal. There is calcified plaque over the abdominal aorta. There is no free fluid or free peritoneal air. Appendix is not seen. There is a slight increased number of small mesenteric lymph nodes. There is a 1.2 cm oval low-density focus within the mesentery of the mid abdomen left of midline just below the aortic bifurcation which may represent a small mesenteric cyst or low-density lymph node.  Pelvic images demonstrate mild bladder distention. Prostate and rectum are within normal. No free fluid.  There is a displaced intertrochanteric fracture of the right hip. There is a subtle nondisplaced fracture of the right inferior pubic ramus. Remaining bones soft tissues are unremarkable.  IMPRESSION: Displaced right intertrochanteric hip fracture. Nondisplaced fracture of  the right inferior pubic ramus.  No acute findings within the chest. No acute intra-abdominal findings.  Minimal nonspecific increased number of small mesenteric lymph nodes. Recommend followup abdominal pelvic CT 3 months.  Minimal centrilobular emphysematous disease.  These results were called by telephone at the time of interpretation on 05/24/2014 at 5:28 pm to Dr. Ermalinda Memos, who verbally acknowledged these results.   Electronically Signed   By: Marin Olp M.D.   On: 05/24/2014 17:28   Ct Abdomen Pelvis W Contrast  05/24/2014   CLINICAL DATA:  Trauma with fall from ladder 6-8 feet. Unable to raise arms above head.  EXAM: CT CHEST, ABDOMEN, AND PELVIS WITH CONTRAST  TECHNIQUE: Multidetector CT imaging of the chest, abdomen and  pelvis was performed following the standard protocol during bolus administration of intravenous contrast.  CONTRAST:  5mL OMNIPAQUE IOHEXOL 300 MG/ML  SOLN  COMPARISON:  CT chest 01/19/2014  FINDINGS: CT CHEST FINDINGS  The lungs are well inflated without consolidation or effusion. There is no pneumothorax. There is mild centrilobular emphysematous disease. Airways are within normal. Heart is normal in size. There is minimal calcified plaque over the thoracic aorta. Remaining mediastinal structures are unremarkable.  CT ABDOMEN AND PELVIS FINDINGS  The exam demonstrates a normal spleen, pancreas, gallbladder and adrenal glands. There is minimal stable atrophy over the anterior right lobe of the liver as a liver is otherwise unremarkable. Kidneys are within normal. There is calcified plaque over the abdominal aorta. There is no free fluid or free peritoneal air. Appendix is not seen. There is a slight increased number of small mesenteric lymph nodes. There is a 1.2 cm oval low-density focus within the mesentery of the mid abdomen left of midline just below the aortic bifurcation which may represent a small mesenteric cyst or low-density lymph node.  Pelvic images demonstrate mild bladder  distention. Prostate and rectum are within normal. No free fluid.  There is a displaced intertrochanteric fracture of the right hip. There is a subtle nondisplaced fracture of the right inferior pubic ramus. Remaining bones soft tissues are unremarkable.  IMPRESSION: Displaced right intertrochanteric hip fracture. Nondisplaced fracture of the right inferior pubic ramus.  No acute findings within the chest. No acute intra-abdominal findings.  Minimal nonspecific increased number of small mesenteric lymph nodes. Recommend followup abdominal pelvic CT 3 months.  Minimal centrilobular emphysematous disease.  These results were called by telephone at the time of interpretation on 05/24/2014 at 5:28 pm to Dr. Ermalinda Memos, who verbally acknowledged these results.   Electronically Signed   By: Marin Olp M.D.   On: 05/24/2014 17:28   Dg Pelvis Portable  05/24/2014   CLINICAL DATA:  Patient fell 8 feet off a ladder with right hip region pain  EXAM: PORTABLE PELVIS 1-2 VIEWS  COMPARISON:  None.  FINDINGS: There is an intertrochanteric femur fracture on the right with mild varus angulation at the fracture site. Fracture extends into the greater trochanter region or fracture is comminuted. No other fractures. No dislocation. Joint spaces appear intact.  IMPRESSION: Comminuted intertrochanteric femur fracture on the right with mild varus angulation at the fracture site. No dislocation seen on this single view.   Electronically Signed   By: Lowella Grip M.D.   On: 05/24/2014 15:03   Ct 3d Recon At Scanner  05/25/2014   CLINICAL DATA:  Nonspecific (abnormal) findings on radiological and other examination of musculoskeletal system. Status post fall from the ladder, 6-8 feet. Right intertrochanteric fracture secondary to the fall.  EXAM: 3-DIMENSIONAL CT IMAGE RENDERING ON ACQUISITION WORKSTATION  TECHNIQUE: 3-dimensional CT images were rendered by post-processing of the original CT data on an acquisition workstation. The  3-dimensional CT images were interpreted and findings were reported in the accompanying complete CT report for this study  COMPARISON:  CT chest, abdomen and pelvis 05/24/2014 at 16:51 hr  FINDINGS: As seen on the comparison examination, the patient has a right hip fracture. The fracture extends from the greater trochanter along the intertrochanteric ridge to the superior margin of the lesser trochanter. Nondisplaced right inferior pubic ramus fracture seen on CT scan is not well demonstrated on these images.  IMPRESSION: Acute right intertrochanteric fracture.  Nondisplaced right inferior pubic ramus fracture is better demonstrated on the  comparison exam.   Electronically Signed   By: Inge Rise M.D.   On: 05/25/2014 07:12   Dg Chest Portable 1 View  05/24/2014   CLINICAL DATA:  Fall 6-8 feet from ladder today. Generalized right pain. Initial encounter  EXAM: PORTABLE CHEST - 1 VIEW  COMPARISON:  CT 01/19/2014  FINDINGS: Mild hyperinflation/COPD. Lungs are clear. No pneumothorax or effusion. Heart is normal size. No visible rib fracture or other acute bony abnormality.  IMPRESSION: COPD.  No active disease.   Electronically Signed   By: Rolm Baptise M.D.   On: 05/24/2014 15:03   Dg C-arm 1-60 Min  05/24/2014   CLINICAL DATA:  Fracture the distal radius and ulna, closed. Initially counter. Fluoroscopy time 16.3 seconds.  EXAM: RIGHT WRIST - 2 VIEW; DG C-ARM 61-120 MIN  COMPARISON:  New 05/24/2014  FINDINGS: The patient has undergone screw plate fixation of the distal radius. Distal ulnar fracture is again noted.  IMPRESSION: Interval ORIF of the distal radius.   Electronically Signed   By: Shon Hale M.D.   On: 05/24/2014 22:48   Dg C-arm 1-60 Min  05/24/2014   CLINICAL DATA:  Right femoral intertrochanteric fracture.  EXAM: DG C-ARM 61-120 MIN; RIGHT FEMUR - 2 VIEW  COMPARISON:  CT 05/24/2014  FINDINGS: Intraoperative images demonstrate surgical fixation of the intertrochanteric fracture with a  dynamic hip screw. Intramedullary nail extends into the distal femur. The right hip is located.  IMPRESSION: Internal fixation of the right femoral intertrochanteric fracture.   Electronically Signed   By: Markus Daft M.D.   On: 05/24/2014 22:43    Oren Binet, MD  [facility] Pager:[phone]  If 7PM-7AM, please contact night-coverage [email or web address] Password [email or web address] 05/28/2014, 11:19 AM   LOS: 4 days

## 2014-05-29 ENCOUNTER — Encounter (HOSPITAL_COMMUNITY): Payer: Self-pay | Admitting: *Deleted

## 2014-05-29 ENCOUNTER — Inpatient Hospital Stay (HOSPITAL_COMMUNITY)
Admission: RE | Admit: 2014-05-29 | Discharge: 2014-06-06 | DRG: 560 | Disposition: A | Discharge status: Home Health | Payer: Worker's Compensation | Source: Intra-hospital | Attending: Physical Medicine & Rehabilitation | Admitting: Physical Medicine & Rehabilitation

## 2014-05-29 DIAGNOSIS — S72141S Displaced intertrochanteric fracture of right femur, sequela: Secondary | ICD-10-CM

## 2014-05-29 DIAGNOSIS — R350 Frequency of micturition: Secondary | ICD-10-CM

## 2014-05-29 DIAGNOSIS — Z79899 Other long term (current) drug therapy: Secondary | ICD-10-CM

## 2014-05-29 DIAGNOSIS — S52501S Unspecified fracture of the lower end of right radius, sequela: Secondary | ICD-10-CM

## 2014-05-29 DIAGNOSIS — J449 Chronic obstructive pulmonary disease, unspecified: Secondary | ICD-10-CM | POA: Diagnosis not present

## 2014-05-29 DIAGNOSIS — W11XXXD Fall on and from ladder, subsequent encounter: Secondary | ICD-10-CM

## 2014-05-29 DIAGNOSIS — F1010 Alcohol abuse, uncomplicated: Secondary | ICD-10-CM

## 2014-05-29 DIAGNOSIS — W11XXXS Fall on and from ladder, sequela: Secondary | ICD-10-CM

## 2014-05-29 DIAGNOSIS — H3530 Unspecified macular degeneration: Secondary | ICD-10-CM

## 2014-05-29 DIAGNOSIS — E559 Vitamin D deficiency, unspecified: Secondary | ICD-10-CM

## 2014-05-29 DIAGNOSIS — F17210 Nicotine dependence, cigarettes, uncomplicated: Secondary | ICD-10-CM | POA: Diagnosis not present

## 2014-05-29 DIAGNOSIS — K5900 Constipation, unspecified: Secondary | ICD-10-CM | POA: Diagnosis not present

## 2014-05-29 DIAGNOSIS — S52501D Unspecified fracture of the lower end of right radius, subsequent encounter for closed fracture with routine healing: Secondary | ICD-10-CM | POA: Diagnosis not present

## 2014-05-29 DIAGNOSIS — D62 Acute posthemorrhagic anemia: Secondary | ICD-10-CM | POA: Diagnosis not present

## 2014-05-29 DIAGNOSIS — W11XXXA Fall on and from ladder, initial encounter: Secondary | ICD-10-CM

## 2014-05-29 DIAGNOSIS — G40909 Epilepsy, unspecified, not intractable, without status epilepticus: Secondary | ICD-10-CM | POA: Diagnosis not present

## 2014-05-29 DIAGNOSIS — S52501A Unspecified fracture of the lower end of right radius, initial encounter for closed fracture: Secondary | ICD-10-CM | POA: Diagnosis present

## 2014-05-29 DIAGNOSIS — S72141D Displaced intertrochanteric fracture of right femur, subsequent encounter for closed fracture with routine healing: Principal | ICD-10-CM

## 2014-05-29 DIAGNOSIS — Z9889 Other specified postprocedural states: Secondary | ICD-10-CM

## 2014-05-29 DIAGNOSIS — T1490XA Injury, unspecified, initial encounter: Secondary | ICD-10-CM

## 2014-05-29 DIAGNOSIS — S72141A Displaced intertrochanteric fracture of right femur, initial encounter for closed fracture: Secondary | ICD-10-CM | POA: Diagnosis present

## 2014-05-29 LAB — CBC
HCT: 22.1 % — ABNORMAL LOW (ref 39.0–52.0)
HCT: 29.2 % — ABNORMAL LOW (ref 39.0–52.0)
Hemoglobin: 7.3 g/dL — ABNORMAL LOW (ref 13.0–17.0)
Hemoglobin: 9.7 g/dL — ABNORMAL LOW (ref 13.0–17.0)
MCH: 28.2 pg (ref 26.0–34.0)
MCH: 28.4 pg (ref 26.0–34.0)
MCHC: 33 g/dL (ref 30.0–36.0)
MCHC: 33.2 g/dL (ref 30.0–36.0)
MCV: 85.3 fL (ref 78.0–100.0)
MCV: 85.4 fL (ref 78.0–100.0)
PLATELETS: 220 10*3/uL (ref 150–400)
Platelets: 245 10*3/uL (ref 150–400)
RBC: 2.59 MIL/uL — ABNORMAL LOW (ref 4.22–5.81)
RBC: 3.42 MIL/uL — AB (ref 4.22–5.81)
RDW: 16.1 % — ABNORMAL HIGH (ref 11.5–15.5)
RDW: 15.7 % — ABNORMAL HIGH (ref 11.5–15.5)
WBC: 4.8 10*3/uL (ref 4.0–10.5)
WBC: 5.9 10*3/uL (ref 4.0–10.5)

## 2014-05-29 LAB — URINALYSIS, ROUTINE W REFLEX MICROSCOPIC
BILIRUBIN URINE: NEGATIVE
Glucose, UA: NEGATIVE mg/dL
Hgb urine dipstick: NEGATIVE
KETONES UR: 15 mg/dL — AB
Leukocytes, UA: NEGATIVE
NITRITE: NEGATIVE
PROTEIN: NEGATIVE mg/dL
SPECIFIC GRAVITY, URINE: 1.023 (ref 1.005–1.030)
UROBILINOGEN UA: 0.2 mg/dL (ref 0.0–1.0)
pH: 6.5 (ref 5.0–8.0)

## 2014-05-29 LAB — PREPARE RBC (CROSSMATCH)

## 2014-05-29 MED ORDER — ALBUTEROL SULFATE (2.5 MG/3ML) 0.083% IN NEBU: 2.5000 mg | INHALATION_SOLUTION | RESPIRATORY_TRACT | Status: DC | PRN

## 2014-05-29 MED ORDER — ACETAMINOPHEN 325 MG PO TABS
325.0000 mg | ORAL_TABLET | ORAL | Status: DC | PRN
  Administered 2014-06-02 (×2): 650 mg via ORAL
  Filled 2014-05-29 (×2): qty 2

## 2014-05-29 MED ORDER — FERROUS SULFATE 325 (65 FE) MG PO TABS
325.0000 mg | ORAL_TABLET | Freq: Two times a day (BID) | ORAL | Status: DC
  Administered 2014-05-29: 325 mg via ORAL
  Filled 2014-05-29 (×3): qty 1

## 2014-05-29 MED ORDER — OXYCODONE HCL 5 MG PO TABS
15.0000 mg | ORAL_TABLET | ORAL | Status: DC | PRN
  Administered 2014-05-29 – 2014-06-06 (×22): 15 mg via ORAL
  Filled 2014-05-29 (×22): qty 3

## 2014-05-29 MED ORDER — DIPHENHYDRAMINE HCL 50 MG/ML IJ SOLN
25.0000 mg | Freq: Once | INTRAMUSCULAR | Status: AC
  Administered 2014-05-29: 25 mg via INTRAVENOUS
  Filled 2014-05-29: qty 1

## 2014-05-29 MED ORDER — ONDANSETRON HCL 4 MG/2ML IJ SOLN: 4.0000 mg | Freq: Four times a day (QID) | INTRAMUSCULAR | Status: DC | PRN

## 2014-05-29 MED ORDER — FUROSEMIDE 10 MG/ML IJ SOLN
20.0000 mg | Freq: Once | INTRAMUSCULAR | Status: AC
  Administered 2014-05-29: 20 mg via INTRAVENOUS
  Filled 2014-05-29: qty 2

## 2014-05-29 MED ORDER — GUAIFENESIN-DM 100-10 MG/5ML PO SYRP: 5.0000 mL | ORAL_SOLUTION | Freq: Four times a day (QID) | ORAL | Status: DC | PRN

## 2014-05-29 MED ORDER — BISACODYL 10 MG RE SUPP: 10.0000 mg | Freq: Every day | RECTAL | Status: DC | PRN

## 2014-05-29 MED ORDER — METHOCARBAMOL 500 MG PO TABS
500.0000 mg | ORAL_TABLET | Freq: Four times a day (QID) | ORAL | Status: DC | PRN
  Administered 2014-05-30 – 2014-06-02 (×4): 500 mg via ORAL
  Filled 2014-05-29 (×4): qty 1

## 2014-05-29 MED ORDER — FERROUS SULFATE 325 (65 FE) MG PO TABS
325.0000 mg | ORAL_TABLET | Freq: Two times a day (BID) | ORAL | Status: DC
  Administered 2014-05-29 – 2014-06-06 (×17): 325 mg via ORAL
  Filled 2014-05-29 (×19): qty 1

## 2014-05-29 MED ORDER — TRAZODONE HCL 50 MG PO TABS: 25.0000 mg | ORAL_TABLET | Freq: Every evening | ORAL | Status: DC | PRN

## 2014-05-29 MED ORDER — DIPHENHYDRAMINE HCL 12.5 MG/5ML PO ELIX: 12.5000 mg | ORAL_SOLUTION | Freq: Four times a day (QID) | ORAL | Status: DC | PRN

## 2014-05-29 MED ORDER — NICOTINE 21 MG/24HR TD PT24
21.0000 mg | MEDICATED_PATCH | Freq: Every day | TRANSDERMAL | Status: DC
  Administered 2014-05-30 – 2014-06-06 (×8): 21 mg via TRANSDERMAL
  Filled 2014-05-29 (×9): qty 1

## 2014-05-29 MED ORDER — TIOTROPIUM BROMIDE MONOHYDRATE 18 MCG IN CAPS
18.0000 ug | ORAL_CAPSULE | Freq: Every day | RESPIRATORY_TRACT | Status: DC
  Administered 2014-05-31 – 2014-06-06 (×6): 18 ug via RESPIRATORY_TRACT
  Filled 2014-05-29 (×2): qty 5

## 2014-05-29 MED ORDER — FAMOTIDINE 10 MG PO TABS
10.0000 mg | ORAL_TABLET | Freq: Every day | ORAL | Status: DC
  Administered 2014-05-29 – 2014-06-05 (×8): 10 mg via ORAL
  Filled 2014-05-29 (×9): qty 1

## 2014-05-29 MED ORDER — GUAIFENESIN-DM 100-10 MG/5ML PO SYRP: 5.0000 mL | ORAL_SOLUTION | ORAL | Status: DC | PRN

## 2014-05-29 MED ORDER — FLEET ENEMA 7-19 GM/118ML RE ENEM: 1.0000 | ENEMA | Freq: Once | RECTAL | Status: AC | PRN

## 2014-05-29 MED ORDER — ENOXAPARIN SODIUM 40 MG/0.4ML ~~LOC~~ SOLN
40.0000 mg | SUBCUTANEOUS | Status: DC
  Administered 2014-05-30 – 2014-06-06 (×9): 40 mg via SUBCUTANEOUS
  Filled 2014-05-29 (×9): qty 0.4

## 2014-05-29 MED ORDER — ONDANSETRON HCL 4 MG PO TABS: 4.0000 mg | ORAL_TABLET | Freq: Four times a day (QID) | ORAL | Status: DC | PRN

## 2014-05-29 MED ORDER — SODIUM CHLORIDE 0.9 % IV SOLN
Freq: Once | INTRAVENOUS | Status: AC
  Administered 2014-05-29: 10:00:00 via INTRAVENOUS

## 2014-05-29 MED ORDER — OXYCODONE HCL ER 10 MG PO T12A
10.0000 mg | EXTENDED_RELEASE_TABLET | Freq: Two times a day (BID) | ORAL | Status: DC
  Administered 2014-05-29 – 2014-06-03 (×11): 10 mg via ORAL
  Filled 2014-05-29 (×11): qty 1

## 2014-05-29 MED ORDER — ALUM & MAG HYDROXIDE-SIMETH 200-200-20 MG/5ML PO SUSP
30.0000 mL | ORAL | Status: DC | PRN
Start: 2014-05-29 — End: 2014-05-29

## 2014-05-29 MED ORDER — ACETAMINOPHEN 325 MG PO TABS
650.0000 mg | ORAL_TABLET | Freq: Once | ORAL | Status: AC
  Administered 2014-05-29: 650 mg via ORAL
  Filled 2014-05-29: qty 2

## 2014-05-29 MED ORDER — ALUM & MAG HYDROXIDE-SIMETH 200-200-20 MG/5ML PO SUSP: 30.0000 mL | ORAL | Status: DC | PRN

## 2014-05-29 MED ORDER — PHENYTOIN SODIUM EXTENDED 100 MG PO CAPS
200.0000 mg | ORAL_CAPSULE | Freq: Two times a day (BID) | ORAL | Status: DC
  Administered 2014-05-29 – 2014-06-04 (×13): 200 mg via ORAL
  Filled 2014-05-29 (×16): qty 2

## 2014-05-29 MED ORDER — SENNOSIDES-DOCUSATE SODIUM 8.6-50 MG PO TABS
1.0000 | ORAL_TABLET | Freq: Two times a day (BID) | ORAL | Status: DC
  Administered 2014-05-29 – 2014-06-02 (×9): 1 via ORAL
  Filled 2014-05-29 (×14): qty 1

## 2014-05-29 MED ORDER — VITAMIN D3 25 MCG (1000 UNIT) PO TABS
1000.0000 [IU] | ORAL_TABLET | Freq: Every day | ORAL | Status: DC
  Administered 2014-05-30 – 2014-06-06 (×8): 1000 [IU] via ORAL
  Filled 2014-05-29 (×9): qty 1

## 2014-05-29 NOTE — Progress Notes (Signed)
[name], MD Physician Signed Physical Medicine and Rehabilitation Consult Note 05/26/2014 8:11 AM  Related encounter: ED to Hosp-Admission (Current) from 05/24/2014 in [facility] Collapse All        Physical Medicine and Rehabilitation Consult  Reason for Consult: Right hip fracture, right wrist fracture Referring Physician: Dr. Ivor Reining   HPI: Joshua Reed is a 68 y.o. male with history of COPD, alcohol abuse, macular degeneration, who was working on a house on 05/24/14 when he slipped and fell 6-8 feet off a ladder. He hit his right side on concrete with onset of right hip pain and right wrist deformity. Work up in ED revealed right distal radius fracture and right IT hip fracture. He was evaluated by Dr. Erlinda Hong and right wrist was manipulated and splinted in ED and he underwent IM nail of right hip fracture the same day. Post op WBAT on RLE and weight bearing through right elbow. PT/OT evaluations done yesterday and CIR recommended for follow up therapy. Follow up labs today with worsening of ABLA with drop in H/H to 6.5   Review of Systems  HENT: Negative for hearing loss.  Eyes: Negative for blurred vision and double vision.  Respiratory: Positive for shortness of breath. Negative for cough and wheezing.  Cardiovascular: Negative for chest pain and palpitations.  Gastrointestinal: Positive for constipation. Negative for heartburn, nausea and abdominal pain.  Genitourinary: Positive for dysuria and frequency. Negative for urgency.  Musculoskeletal: Positive for myalgias and joint pain. Negative for back pain.  Neurological: Negative for dizziness, tingling and headaches.  Psychiatric/Behavioral: Negative for memory loss. The patient has insomnia (due to pain).      Past Medical History  Diagnosis Date  . Seizure disorder 2205    ALCOHOL RELATED  . Macular degeneration   . COPD (chronic obstructive  pulmonary disease)   . Alcohol abuse   . Vitamin D deficiency    Past Surgical History  Procedure Laterality Date  . Cardiac catheterization  2002    normal  . Open reduction internal fixation (orif) distal radial fracture Right 05/24/2014    Procedure: OPEN REDUCTION INTERNAL FIXATION (ORIF) DISTAL RADIAL FRACTURE; Surgeon: Marianna Payment, MD; Location: [facility]; Service: Orthopedics; Laterality: Right;  . Intramedullary (im) nail intertrochanteric Right 05/24/2014    Procedure: INTRAMEDULLARY (IM) NAIL INTERTROCHANTRIC; Surgeon: Marianna Payment, MD; Location: [facility]; Service: Orthopedics; Laterality: Right;   Family History  Problem Relation Age of Onset  . Heart attack Father    Social History: Married. Wife at home and can provide supervision past discharge. Works for [employer]. He reports that he has been smoking Cigarettes. He has been smoking about 1 PPD. He does not have any smokeless tobacco history on file. Per reports that he does not drink alcohol. His drug history is not on file.     Allergies  Allergen Reactions  . Acyclovir And Related    Medications Prior to Admission  Medication Sig Dispense Refill  . phenytoin (DILANTIN) 100 MG ER capsule Take 500 mg by mouth every morning.     . cholecalciferol (VITAMIN D) 1000 UNITS tablet Take 1,000 Units by mouth daily.      Home: Home Living Family/patient expects to be discharged to:: Inpatient rehab Living Arrangements: Spouse/significant other Additional Comments: Pt's wife is very anxious in regards to d/c planning. She does not feel ready to take care of him and verbalizes fear of him falling.  They would like rehab (CIR or SNF) prior to d/c home. Therapist in agreement   Functional History: Prior Function Level of Independence: Independent Comments: Pt works for [employer]. Fall occurred at work Functional  Status:  Mobility: Bed Mobility Overal bed mobility: Needs Assistance Bed Mobility: Sit to Supine Supine to sit: Mod assist Sit to supine: Min assist General bed mobility comments: (A) for Bil LEs onto bed. Pt leaned to Left elbow and returned trunk to supine slowly.  Transfers Overall transfer level: Needs assistance Equipment used: (gait belt assisted transfer) Transfers: Sit to/from Stand, Stand Pivot Transfers Sit to Stand: Mod assist Stand pivot transfers: Mod assist General transfer comment: SPT from recliner to bed. Pt able to hold onto OT with his LUE. Pt reported that [place] in room was broken or would not work so did not want to attempt. However, per PT note she left it in room for pt to use with subsequent therapy sessions.  Ambulation/Gait General Gait Details: not able to assess on eval    ADL: ADL Overall ADL's : Needs assistance/impaired Eating/Feeding: Set up, Sitting Eating/Feeding Details (indicate cue type and reason): Pt requires assistance to open containers and cut food.  Grooming: Set up, Sitting Upper Body Bathing: Minimal assitance, Sitting Upper Body Bathing Details (indicate cue type and reason): pt unable to maintain balance sitting on edge of chair without UE support and will require assistance Lower Body Bathing: Maximal assistance, Sit to/from stand Upper Body Dressing : Moderate assistance, Sitting Upper Body Dressing Details (indicate cue type and reason): (A) for balance in sitting and to thread RUE Lower Body Dressing: Total assistance, Sit to/from stand Toilet Transfer: Moderate assistance, Stand-pivot (gait belt assisted transfer) General ADL Comments: Pt limited by severe pain today in RLE and RUE. Pt with difficulty sitting EOB/chair and leans to the left to take pressure off his right side.   Cognition: Cognition Overall Cognitive Status: Within Functional Limits for tasks assessed Orientation Level: Oriented  X4 Cognition Arousal/Alertness: Awake/alert Behavior During Therapy: WFL for tasks assessed/performed Overall Cognitive Status: Within Functional Limits for tasks assessed  Blood pressure 113/60, pulse 85, temperature 98.5 F (36.9 C), temperature source Oral, resp. rate 16, height 5\' 7"  (1.702 m), weight 66.3 kg (146 lb 2.6 oz), SpO2 96 %. Physical Exam  Constitutional: He appears well-developed and well-nourished.  HENT:  Head: Normocephalic.  Eyes: Conjunctivae and EOM are normal. Pupils are equal, round, and reactive to light.  Neck: No tracheal deviation present. No thyromegaly present.  Cardiovascular: Normal rate.  Respiratory: Effort normal.  GI: He exhibits no distension.  Musculoskeletal: He exhibits edema.  Right arm in splint, fingers swollen but can grip. Unable to lift right leg off pillow. Some swelling in right leg.  Neurological:  Alert and appropriate. No sensory loss. Limbs only inhibited by ortho/pain issues.   Psychiatric: He has a normal mood and affect. His behavior is normal. Thought content normal.     Lab Results Last 24 Hours    Results for orders placed or performed during the hospital encounter of 05/24/14 (from the past 24 hour(s))  CBC Status: Abnormal   Collection Time: 05/26/14 6:00 AM  Result Value Ref Range   WBC 7.8 4.0 - 10.5 K/uL   RBC 2.23 (L) 4.22 - 5.81 MIL/uL   Hemoglobin 6.5 (LL) 13.0 - 17.0 g/dL   HCT 19.1 (L) 39.0 - 52.0 %   MCV 85.7 78.0 - 100.0 fL   MCH 29.1 26.0 - 34.0 pg  MCHC 34.0 30.0 - 36.0 g/dL   RDW 16.0 (H) 11.5 - 15.5 %   Platelets 170 150 - 400 K/uL  Phenytoin level, total Status: None   Collection Time: 05/26/14 6:00 AM  Result Value Ref Range   Phenytoin Lvl 16.7 10.0 - 20.0 ug/mL      Imaging Results (Last 48 hours)    Dg Wrist 2 Views Right  05/24/2014 CLINICAL DATA: Fracture the distal radius and ulna, closed. Initially counter.  Fluoroscopy time 16.3 seconds. EXAM: RIGHT WRIST - 2 VIEW; DG C-ARM 61-120 MIN COMPARISON: New 05/24/2014 FINDINGS: The patient has undergone screw plate fixation of the distal radius. Distal ulnar fracture is again noted. IMPRESSION: Interval ORIF of the distal radius. Electronically Signed By: Shon Hale M.D. On: 05/24/2014 22:48   Dg Wrist 2 Views Right  05/24/2014 CLINICAL DATA: Post reduction wrist fracture. EXAM: RIGHT WRIST - 2 VIEW COMPARISON: 05/24/2014 FINDINGS: Wrist is imaged through splinting material. There are comminuted fractures of the distal radius and ulna. There has been interval reduction with significant improvement in the angulation of the radiocarpal joint. Intercarpal spaces are grossly normal. Nondisplaced carpal fractures would be difficult to exclude but are not identified. IMPRESSION: 1. Interval reduction of wrist fractures. 2. Significantly improved radiocarpal angulation. Electronically Signed By: Shon Hale M.D. On: 05/24/2014 18:41   Dg Wrist 2 Views Right  05/24/2014 CLINICAL DATA: [place] off ladder today from 10 feet. Deformity of right wrist. Right wrist pain. Initial encounter. EXAM: RIGHT WRIST - 2 VIEW COMPARISON: None. FINDINGS: Study is limited due to patient condition and limited positioning. Comminuted fracture noted through the distal right radius with impaction. No definite dislocation. Ulnar styloid fracture. Diffuse soft tissue swelling noted. IMPRESSION: Comminuted, impacted distal right radial fracture. Distal ulnar/ulnar styloid fracture. Electronically Signed By: Rolm Baptise M.D. On: 05/24/2014 15:49   Dg Femur Right  05/24/2014 CLINICAL DATA: Right femoral intertrochanteric fracture. EXAM: DG C-ARM 61-120 MIN; RIGHT FEMUR - 2 VIEW COMPARISON: CT 05/24/2014 FINDINGS: Intraoperative images demonstrate surgical fixation of the intertrochanteric fracture with a dynamic hip screw. Intramedullary nail extends into  the distal femur. The right hip is located. IMPRESSION: Internal fixation of the right femoral intertrochanteric fracture. Electronically Signed By: Markus Daft M.D. On: 05/24/2014 22:43   Ct Chest W Contrast  05/24/2014 CLINICAL DATA: Trauma with fall from ladder 6-8 feet. Unable to raise arms above head. EXAM: CT CHEST, ABDOMEN, AND PELVIS WITH CONTRAST TECHNIQUE: Multidetector CT imaging of the chest, abdomen and pelvis was performed following the standard protocol during bolus administration of intravenous contrast. CONTRAST: 46mL OMNIPAQUE IOHEXOL 300 MG/ML SOLN COMPARISON: CT chest 01/19/2014 FINDINGS: CT CHEST FINDINGS The lungs are well inflated without consolidation or effusion. There is no pneumothorax. There is mild centrilobular emphysematous disease. Airways are within normal. Heart is normal in size. There is minimal calcified plaque over the thoracic aorta. Remaining mediastinal structures are unremarkable. CT ABDOMEN AND PELVIS FINDINGS The exam demonstrates a normal spleen, pancreas, gallbladder and adrenal glands. There is minimal stable atrophy over the anterior right lobe of the liver as a liver is otherwise unremarkable. Kidneys are within normal. There is calcified plaque over the abdominal aorta. There is no free fluid or free peritoneal air. Appendix is not seen. There is a slight increased number of small mesenteric lymph nodes. There is a 1.2 cm oval low-density focus within the mesentery of the mid abdomen left of midline just below the aortic bifurcation which may represent a small mesenteric cyst or low-density lymph node.  Pelvic images demonstrate mild bladder distention. Prostate and rectum are within normal. No free fluid. There is a displaced intertrochanteric fracture of the right hip. There is a subtle nondisplaced fracture of the right inferior pubic ramus. Remaining bones soft tissues are unremarkable. IMPRESSION: Displaced right intertrochanteric hip  fracture. Nondisplaced fracture of the right inferior pubic ramus. No acute findings within the chest. No acute intra-abdominal findings. Minimal nonspecific increased number of small mesenteric lymph nodes. Recommend followup abdominal pelvic CT 3 months. Minimal centrilobular emphysematous disease. These results were called by telephone at the time of interpretation on 05/24/2014 at 5:28 pm to Dr. Ermalinda Memos, who verbally acknowledged these results. Electronically Signed By: Marin Olp M.D. On: 05/24/2014 17:28   Ct Abdomen Pelvis W Contrast  05/24/2014 CLINICAL DATA: Trauma with fall from ladder 6-8 feet. Unable to raise arms above head. EXAM: CT CHEST, ABDOMEN, AND PELVIS WITH CONTRAST TECHNIQUE: Multidetector CT imaging of the chest, abdomen and pelvis was performed following the standard protocol during bolus administration of intravenous contrast. CONTRAST: 43mL OMNIPAQUE IOHEXOL 300 MG/ML SOLN COMPARISON: CT chest 01/19/2014 FINDINGS: CT CHEST FINDINGS The lungs are well inflated without consolidation or effusion. There is no pneumothorax. There is mild centrilobular emphysematous disease. Airways are within normal. Heart is normal in size. There is minimal calcified plaque over the thoracic aorta. Remaining mediastinal structures are unremarkable. CT ABDOMEN AND PELVIS FINDINGS The exam demonstrates a normal spleen, pancreas, gallbladder and adrenal glands. There is minimal stable atrophy over the anterior right lobe of the liver as a liver is otherwise unremarkable. Kidneys are within normal. There is calcified plaque over the abdominal aorta. There is no free fluid or free peritoneal air. Appendix is not seen. There is a slight increased number of small mesenteric lymph nodes. There is a 1.2 cm oval low-density focus within the mesentery of the mid abdomen left of midline just below the aortic bifurcation which may represent a small mesenteric cyst or low-density lymph node.  Pelvic images demonstrate mild bladder distention. Prostate and rectum are within normal. No free fluid. There is a displaced intertrochanteric fracture of the right hip. There is a subtle nondisplaced fracture of the right inferior pubic ramus. Remaining bones soft tissues are unremarkable. IMPRESSION: Displaced right intertrochanteric hip fracture. Nondisplaced fracture of the right inferior pubic ramus. No acute findings within the chest. No acute intra-abdominal findings. Minimal nonspecific increased number of small mesenteric lymph nodes. Recommend followup abdominal pelvic CT 3 months. Minimal centrilobular emphysematous disease. These results were called by telephone at the time of interpretation on 05/24/2014 at 5:28 pm to Dr. Ermalinda Memos, who verbally acknowledged these results. Electronically Signed By: Marin Olp M.D. On: 05/24/2014 17:28   Dg Pelvis Portable  05/24/2014 CLINICAL DATA: Patient fell 8 feet off a ladder with right hip region pain EXAM: PORTABLE PELVIS 1-2 VIEWS COMPARISON: None. FINDINGS: There is an intertrochanteric femur fracture on the right with mild varus angulation at the fracture site. Fracture extends into the greater trochanter region or fracture is comminuted. No other fractures. No dislocation. Joint spaces appear intact. IMPRESSION: Comminuted intertrochanteric femur fracture on the right with mild varus angulation at the fracture site. No dislocation seen on this single view. Electronically Signed By: Lowella Grip M.D. On: 05/24/2014 15:03   Ct 3d Recon At Scanner  05/25/2014 CLINICAL DATA: Nonspecific (abnormal) findings on radiological and other examination of musculoskeletal system. Status post fall from the ladder, 6-8 feet. Right intertrochanteric fracture secondary to the fall. EXAM: 3-DIMENSIONAL CT IMAGE RENDERING ON ACQUISITION  WORKSTATION TECHNIQUE: 3-dimensional CT images were rendered by post-processing of the original CT  data on an acquisition workstation. The 3-dimensional CT images were interpreted and findings were reported in the accompanying complete CT report for this study COMPARISON: CT chest, abdomen and pelvis 05/24/2014 at 16:51 hr FINDINGS: As seen on the comparison examination, the patient has a right hip fracture. The fracture extends from the greater trochanter along the intertrochanteric ridge to the superior margin of the lesser trochanter. Nondisplaced right inferior pubic ramus fracture seen on CT scan is not well demonstrated on these images. IMPRESSION: Acute right intertrochanteric fracture. Nondisplaced right inferior pubic ramus fracture is better demonstrated on the comparison exam. Electronically Signed By: Inge Rise M.D. On: 05/25/2014 07:12   Dg Chest Portable 1 View  05/24/2014 CLINICAL DATA: Fall 6-8 feet from ladder today. Generalized right pain. Initial encounter EXAM: PORTABLE CHEST - 1 VIEW COMPARISON: CT 01/19/2014 FINDINGS: Mild hyperinflation/COPD. Lungs are clear. No pneumothorax or effusion. Heart is normal size. No visible rib fracture or other acute bony abnormality. IMPRESSION: COPD. No active disease. Electronically Signed By: Rolm Baptise M.D. On: 05/24/2014 15:03   Dg C-arm 1-60 Min  05/24/2014 CLINICAL DATA: Fracture the distal radius and ulna, closed. Initially counter. Fluoroscopy time 16.3 seconds. EXAM: RIGHT WRIST - 2 VIEW; DG C-ARM 61-120 MIN COMPARISON: New 05/24/2014 FINDINGS: The patient has undergone screw plate fixation of the distal radius. Distal ulnar fracture is again noted. IMPRESSION: Interval ORIF of the distal radius. Electronically Signed By: Shon Hale M.D. On: 05/24/2014 22:48   Dg C-arm 1-60 Min  05/24/2014 CLINICAL DATA: Right femoral intertrochanteric fracture. EXAM: DG C-ARM 61-120 MIN; RIGHT FEMUR - 2 VIEW COMPARISON: CT 05/24/2014 FINDINGS: Intraoperative images demonstrate surgical fixation of  the intertrochanteric fracture with a dynamic hip screw. Intramedullary nail extends into the distal femur. The right hip is located. IMPRESSION: Internal fixation of the right femoral intertrochanteric fracture. Electronically Signed By: Markus Daft M.D. On: 05/24/2014 22:43     Assessment/Plan: Diagnosis: right distal radius fx, right intertrochanteric fx after fall 1. Does the need for close, 24 hr/day medical supervision in concert with the patient's rehab needs make it unreasonable for this patient to be served in a less intensive setting? Yes 2. Co-Morbidities requiring supervision/potential complications: copd, seizure disorder 3. Due to bladder management, bowel management, safety, skin/wound care, disease management, medication administration, pain management and patient education, does the patient require 24 hr/day rehab nursing? Yes 4. Does the patient require coordinated care of a physician, rehab nurse, PT (1-2 hrs/day, 5 days/week) and OT (1-2 hrs/day, 5 days/week) to address physical and functional deficits in the context of the above medical diagnosis(es)? Yes Addressing deficits in the following areas: balance, endurance, locomotion, strength, transferring, bowel/bladder control, bathing, dressing, feeding, grooming, toileting and psychosocial support 5. Can the patient actively participate in an intensive therapy program of at least 3 hrs of therapy per day at least 5 days per week? Yes 6. The potential for patient to make measurable gains while on inpatient rehab is excellent 7. Anticipated functional outcomes upon discharge from inpatient rehab are modified independent and supervision with PT, modified independent, supervision and min assist with OT, n/a with SLP. 8. Estimated rehab length of stay to reach the above functional goals is: 10-14 days 9. Does the patient have adequate social supports to accommodate these discharge functional goals? Yes 10. Anticipated D/C  setting: Home 11. Anticipated post D/C treatments: HH therapy and Outpatient therapy 12. Overall Rehab/Functional Prognosis: excellent  RECOMMENDATIONS:  This patient's condition is appropriate for continued rehabilitative care in the following setting: CIR Patient has agreed to participate in recommended program. Yes Note that insurance prior authorization may be required for reimbursement for recommended care.  Comment: Rehab Admissions Coordinator to follow up.  Thanks,  [name], MD, [facility]     05/26/2014       Revision History     Date/Time User Provider Type Action   05/26/2014 3:20 PM [name], MD Physician Sign   05/26/2014 9:23 AM Bary Leriche, PA-C Physician Assistant Share   View Details Report       Routing History     Date/Time From To Method   05/26/2014 3:20 PM [name], MD [name], MD In Basket   05/26/2014 3:20 PM [name], MD Hulan Fess, MD Fax

## 2014-05-29 NOTE — H&P (Signed)
Physical Medicine and Rehabilitation Admission H&P   Chief Complaint  Patient presents with  . Fall with right hip and right wrist fractures.     HPI: Joshua Reed is a 68 y.o. male with history of COPD, alcohol abuse, macular degeneration, who was working on a house on 05/24/14 when he slipped and fell 6-8 feet off a ladder. He hit his right side on concrete with onset of right hip pain and right wrist deformity. Work up in ED revealed right distal radius fracture and right IT hip fracture. He was evaluated by Dr. Erlinda Hong and right wrist was manipulated and splinted in ED and he underwent IM nail of right hip fracture the same day. Post op WBAT on RLE and weight bearing through right elbow. PT/OT evaluations done yesterday and CIR recommended for follow up therapy. Follow up labs on 11/06 with worsening of ABLA with drop in H/H to 6.5 and he was transfused with 2 units PRBC and is to receive another unit of PRBC today. Therapy ongoing and patient with improvement in orthostatic symptoms but continues to have problems with endurance    Review of Systems  HENT: Negative for hearing loss.  Eyes: Negative for blurred vision and double vision.  Respiratory: Negative for cough and shortness of breath.  Cardiovascular: Negative for chest pain and palpitations.  Gastrointestinal: Negative for heartburn, nausea, abdominal pain and constipation.  Genitourinary: Positive for urgency and frequency.  Musculoskeletal: Positive for myalgias and joint pain.  Neurological: Positive for weakness. Negative for dizziness, tingling and headaches.  Endo/Heme/Allergies: Bruises/bleeds easily.  Psychiatric/Behavioral: Negative for memory loss. The patient has insomnia.     Past Medical History  Diagnosis Date  . Seizure disorder 2205    ALCOHOL RELATED  . Macular degeneration   . COPD (chronic obstructive pulmonary disease)   . Alcohol abuse   . Vitamin D deficiency      Past Surgical History  Procedure Laterality Date  . Cardiac catheterization  2002    normal  . Open reduction internal fixation (orif) distal radial fracture Right 05/24/2014    Procedure: OPEN REDUCTION INTERNAL FIXATION (ORIF) DISTAL RADIAL FRACTURE; Surgeon: Marianna Payment, MD; Location: [facility]; Service: Orthopedics; Laterality: Right;  . Intramedullary (im) nail intertrochanteric Right 05/24/2014    Procedure: INTRAMEDULLARY (IM) NAIL INTERTROCHANTRIC; Surgeon: Marianna Payment, MD; Location: [facility]; Service: Orthopedics; Laterality: Right;    Family History  Problem Relation Age of Onset  . Heart attack Father     Social History: Married. Wife at home and can provide supervision past discharge. Works for [employer]. He reports that he has been smoking Cigarettes. He has been smoking about 1 PPD. He does not have any smokeless tobacco history on file. Per reports that he does not drink alcohol. His drug history is not on file.    Allergies  Allergen Reactions  . Acyclovir And Related     Medications Prior to Admission  Medication Sig Dispense Refill  . phenytoin (DILANTIN) 100 MG ER capsule Take 500 mg by mouth every morning.     . cholecalciferol (VITAMIN D) 1000 UNITS tablet Take 1,000 Units by mouth daily.      Home: Home Living Family/patient expects to be discharged to:: Inpatient rehab Living Arrangements: Spouse/significant other Additional Comments: Pt's wife is very anxious in regards to d/c planning. She does not feel ready to take care of him and verbalizes fear of him falling. They would like rehab (CIR or SNF) prior  to d/c home. Therapist in agreement   Functional History: Prior Function Level of Independence: Independent Comments: Pt works for [employer]. Fall occurred at work  Functional Status:  Mobility: Bed Mobility Overal bed mobility: Needs  Assistance Bed Mobility: Supine to Sit Supine to sit: +2 for physical assistance, Min assist Sit to supine: Min assist General bed mobility comments: min assist for upper body and second person to assist R LE Transfers Overall transfer level: Needs assistance Equipment used: Right platform walker Transfers: Sit to/from Stand Sit to Stand: Min assist, +2 safety/equipment Stand pivot transfers: Min assist General transfer comment: min assist to rise mostly on left LE and for balance; second person for safety to assist with equipment Ambulation/Gait Ambulation/Gait assistance: Mod assist, +2 safety/equipment Ambulation Distance (Feet): 10 Feet Assistive device: Right platform walker Gait Pattern/deviations: Step-to pattern Gait velocity: decreased  General Gait Details: Pt. unable to advance RW and needed mod assist to do so; moving R LE slightly better today. Denies dizziness, fatigues early    ADL: ADL Overall ADL's : Needs assistance/impaired Eating/Feeding: Set up, Sitting Eating/Feeding Details (indicate cue type and reason): Pt requires assistance to open containers and cut food.  Grooming: Set up, Sitting Upper Body Bathing: Minimal assitance, Sitting Upper Body Bathing Details (indicate cue type and reason): pt unable to maintain balance sitting on edge of chair without UE support and will require assistance Lower Body Bathing: Moderate assistance, Sit to/from stand Lower Body Bathing Details (indicate cue type and reason): Pt able to help with cleaning bottom in standing while maintaining balance Upper Body Dressing : Moderate assistance, Sitting Upper Body Dressing Details (indicate cue type and reason): (A) for balance in sitting and to thread RUE Lower Body Dressing: Total assistance, Sit to/from stand Toilet Transfer: Moderate assistance, Stand-pivot (gait belt assisted transfer) Functional mobility during ADLs: Minimal assistance, Rolling walker, Cueing for  sequencing, Cueing for safety General ADL Comments: Making steady progress. Able to ambulate @ room with platform rw during ADL. Cues for sequencing [place] eof RW  Cognition: Cognition Overall Cognitive Status: Within Functional Limits for tasks assessed Orientation Level: Oriented X4 Cognition Arousal/Alertness: Awake/alert Behavior During Therapy: WFL for tasks assessed/performed Overall Cognitive Status: Within Functional Limits for tasks assessed  Physical Exam: Blood pressure 112/60, pulse 76, temperature 98 F (36.7 C), temperature source Oral, resp. rate 16, height '5\' 7"'  (1.702 m), weight 65.5 kg (144 lb 6.4 oz), SpO2 99 %.  Constitutional: He is oriented to person, place, and time. He appears well-developed.  HENT: oral mucosa pink and moist Head: Normocephalic and atraumatic.  Eyes: Conjunctivae are normal. Pupils are equal, round, and reactive to light.  Neck: Normal range of motion. Neck supple.  Cardiovascular: Normal rate and regular rhythm.  Respiratory: Effort normal and breath sounds normal. No respiratory distress. He has no wheezes.  GI: Soft. Bowel sounds are normal. He exhibits no distension. There is no tenderness.  Musculoskeletal:  Right hip sensitive to touch. Incision clean and dry with staples in place and resolving ecchymosis. LUE splinted with decrease in edema left hand. still Unable to lift right leg off pillow. Some swelling in right leg.  Neurological: He is alert and oriented to person, place, and time.  Follows commands without difficulty. RUE/RLE limited by pain and splints. Does have 4/5 grip and can lift elbow off pillow agst gravity and is able to abduct right arm to 45 degrees. RLE 1/5 hip/knee and 4/5 ADF/APF. Moves LUE/LLE 5/5.   Skin: Skin is warm and  dry Psych: mood pleasant and cooperative.     Lab Results Last 48 Hours    Results for orders placed or performed during the hospital encounter of 05/24/14 (from the past 48 hour(s))  CBC  Status: Abnormal   Collection Time: 05/28/14 5:15 AM  Result Value Ref Range   WBC 5.3 4.0 - 10.5 K/uL   RBC 2.82 (L) 4.22 - 5.81 MIL/uL   Hemoglobin 7.9 (L) 13.0 - 17.0 g/dL   HCT 23.7 (L) 39.0 - 52.0 %   MCV 84.0 78.0 - 100.0 fL   MCH 28.0 26.0 - 34.0 pg   MCHC 33.3 30.0 - 36.0 g/dL   RDW 16.1 (H) 11.5 - 15.5 %   Platelets 175 150 - 400 K/uL  Basic metabolic panel Status: Abnormal   Collection Time: 05/28/14 5:15 AM  Result Value Ref Range   Sodium 135 (L) 137 - 147 mEq/L   Potassium 4.0 3.7 - 5.3 mEq/L   Chloride 99 96 - 112 mEq/L   CO2 25 19 - 32 mEq/L   Glucose, Bld 110 (H) 70 - 99 mg/dL   BUN 10 6 - 23 mg/dL   Creatinine, Ser 0.65 0.50 - 1.35 mg/dL   Calcium 7.8 (L) 8.4 - 10.5 mg/dL   GFR calc non Af Amer >90 >90 mL/min   GFR calc Af Amer >90 >90 mL/min    Comment: (NOTE) The eGFR has been calculated using the CKD EPI equation. This calculation has not been validated in all clinical situations. eGFR's persistently <90 mL/min signify possible Chronic Kidney Disease.    Anion gap 11 5 - 15  CBC Status: Abnormal   Collection Time: 05/29/14 5:24 AM  Result Value Ref Range   WBC 4.8 4.0 - 10.5 K/uL   RBC 2.59 (L) 4.22 - 5.81 MIL/uL   Hemoglobin 7.3 (L) 13.0 - 17.0 g/dL   HCT 22.1 (L) 39.0 - 52.0 %   MCV 85.3 78.0 - 100.0 fL   MCH 28.2 26.0 - 34.0 pg   MCHC 33.0 30.0 - 36.0 g/dL   RDW 16.1 (H) 11.5 - 15.5 %   Platelets 220 150 - 400 K/uL  Prepare RBC Status: None   Collection Time: 05/29/14 7:30 AM  Result Value Ref Range   Order Confirmation ORDER PROCESSED BY BLOOD BANK       Imaging Results (Last 48 hours)    No results found.       Medical Problem List and Plan: 1. Functional deficits secondary to Right distal radius fx, Right intertrochanteric fx after fall 2. DVT  Prophylaxis/Anticoagulation: Pharmaceutical: Lovenox 3. Pain Management: ON hydrocodone, oxycodone and IV morphine at current time. Will increase oxycodone to 15 mg prn and add low dose OxyContin for more consistent relief. . Continue robaxin prn for muscle spasms.  4. Mood: Team to provide ego support. LCSW to follow for evaluation and support.  5. Neuropsych: This patient is capable of making decisions on his own behalf. 6. Skin/Wound Care: Routine pressure relief measures. Monitor wound daily for healing. Elevation for edema control.  7. Fluids/Electrolytes/Nutrition: Monitor I/O. Offer supplement as indicated if intake poor.  8. H/o seizures (alcohol related): On dilantin and has been seizure free for a year. Recheck dilantin level due to decrease in dosage.  9. ABLA: Continue to monitor H/H with routine checks. Continue iron supplement 10. COPD: Encourage IS. Continue Spiriva.  11. Frequency: will check UA/UCS. Urinary retention resolved.     Post Admission Physician Evaluation: 1. Functional deficits secondary to  right distal radius and right intertrochanteric fx after fall. 2. Patient is admitted to receive collaborative, interdisciplinary care between the physiatrist, rehab nursing staff, and therapy team. 3. Patient's level of medical complexity and substantial therapy needs in context of that medical necessity cannot be provided at a lesser intensity of care such as a SNF. 4. Patient has experienced substantial functional loss from his/her baseline which was documented above under the "Functional History" and "Functional Status" headings. Judging by the patient's diagnosis, physical exam, and functional history, the patient has potential for functional progress which will result in measurable gains while on inpatient rehab. These gains will be of substantial and practical use upon discharge in facilitating mobility and self-care at the household level. 5. Physiatrist will  provide 24 hour management of medical needs as well as oversight of the therapy plan/treatment and provide guidance as appropriate regarding the interaction of the two. 6. 24 hour rehab nursing will assist with bladder management, bowel management, safety, skin/wound care, disease management, medication administration, pain management and patient education and help integrate therapy concepts, techniques,education, etc. 7. PT will assess and treat for/with: Lower extremity strength, range of motion, stamina, balance, functional mobility, safety, adaptive techniques and equipment, pain mgt, ortho precautions. Goals are: mod I to supervision. 8. OT will assess and treat for/with: ADL's, functional mobility, safety, upper extremity strength, adaptive techniques and equipment, pain mgt, ortho precautions, leisure awareness. Goals are: mod I to supervision. Therapy may not yet proceed with showering this patient. 9. SLP will assess and treat for/with: n/a. Goals are: n/a. 10. Case Management and Social Worker will assess and treat for psychological issues and discharge planning. 11. Team conference will be held weekly to assess progress toward goals and to determine barriers to discharge. 12. Patient will receive at least 3 hours of therapy per day at least 5 days per week. 13. ELOS: 7-10 days  14. Prognosis: excellent     [name], MD, [facility] 05/29/2014

## 2014-05-29 NOTE — Progress Notes (Signed)
Patient transferred to 4W CIR/Rehab. Report called to RN.

## 2014-05-29 NOTE — Discharge Summary (Signed)
PATIENT DETAILS Name: Joshua Reed Age: 68 y.o. Sex: male Date of Birth: 1946/05/19 MRN: [id number]. Admitting Physician: Thurnell Lose, MD NWG:[id number],[id number] Marigene Ehlers, MD  Admit Date: 2014-06-23 Discharge date: 05/29/2014  Recommendations for Outpatient Follow-up:  Please check CBC 11/10 am and then periodically thereafter Please ensure Ortho follows patient at CIR.  PRIMARY DISCHARGE DIAGNOSIS:  Principal Problem:   Femur fracture, right Active Problems:   Seizure disorder   COPD (chronic obstructive pulmonary disease)   Alcohol abuse   Vitamin D deficiency   Macular degeneration   Right wrist fracture   Hip fracture   Malnutrition of moderate degree      PAST MEDICAL HISTORY: Past Medical History  Diagnosis Date  . Seizure disorder 2205    ALCOHOL RELATED  . Macular degeneration   . COPD (chronic obstructive pulmonary disease)   . Alcohol abuse   . Vitamin D deficiency     DISCHARGE MEDICATIONS: Current Discharge Medication List    START taking these medications   Details  enoxaparin (LOVENOX) 40 MG/0.4ML injection Inject 0.4 mLs (40 mg total) into the skin daily. Qty: 14 Syringe, Refills: 0    oxyCODONE (OXY IR/ROXICODONE) 5 MG immediate release tablet Take 1-3 tablets (5-15 mg total) by mouth every 4 (four) hours as needed. Qty: 90 tablet, Refills: 0      CONTINUE these medications which have NOT CHANGED   Details  phenytoin (DILANTIN) 100 MG ER capsule Take 500 mg by mouth every morning.     cholecalciferol (VITAMIN D) 1000 UNITS tablet Take 1,000 Units by mouth daily.        ALLERGIES:   Allergies  Allergen Reactions  . Acyclovir And Related     BRIEF HPI:  See H&P, Labs, Consult and Test reports for all details in brief, patient is a 68 y.o. male,with H/O seizures last 41yr ago, COPD, smoking, remote H/O alcohol abuse, works as an Clinical biochemist, was at work and climbing a ladder-slipped fell on concrete, hit his R side, had ++ pain came to  the ER found to have R femur and wrist fracture  CONSULTATIONS:   orthopedic surgery  PERTINENT RADIOLOGIC STUDIES: Dg Wrist 2 Views Right  2014-06-23   CLINICAL DATA:  Fracture the distal radius and ulna, closed. Initially counter. Fluoroscopy time 16.3 seconds.  EXAM: RIGHT WRIST - 2 VIEW; DG C-ARM 61-120 MIN  COMPARISON:  New 06/23/14  FINDINGS: The patient has undergone screw plate fixation of the distal radius. Distal ulnar fracture is again noted.  IMPRESSION: Interval ORIF of the distal radius.   Electronically Signed   By: Shon Hale M.D.   On: 06-23-14 22:48   Dg Wrist 2 Views Right  06-23-14   CLINICAL DATA:  Post reduction wrist fracture.  EXAM: RIGHT WRIST - 2 VIEW  COMPARISON:  06-23-2014  FINDINGS: Wrist is imaged through splinting material. There are comminuted fractures of the distal radius and ulna. There has been interval reduction with significant improvement in the angulation of the radiocarpal joint. Intercarpal spaces are grossly normal. Nondisplaced carpal fractures would be difficult to exclude but are not identified.  IMPRESSION: 1. Interval reduction of wrist fractures. 2. Significantly improved radiocarpal angulation.   Electronically Signed   By: Shon Hale M.D.   On: 06/23/14 18:41   Dg Wrist 2 Views Right  23-Jun-2014   CLINICAL DATA:  [place] off ladder today from 10 feet. Deformity of right wrist. Right wrist pain. Initial encounter.  EXAM: RIGHT WRIST - 2 VIEW  COMPARISON:  None.  FINDINGS: Study is limited due to patient condition and limited positioning. Comminuted fracture noted through the distal right radius with impaction. No definite dislocation. Ulnar styloid fracture. Diffuse soft tissue swelling noted.  IMPRESSION: Comminuted, impacted distal right radial fracture. Distal ulnar/ulnar styloid fracture.   Electronically Signed   By: Rolm Baptise M.D.   On: 05/24/2014 15:49   Dg Femur Right  05/24/2014   CLINICAL DATA:  Right femoral intertrochanteric  fracture.  EXAM: DG C-ARM 61-120 MIN; RIGHT FEMUR - 2 VIEW  COMPARISON:  CT 05/24/2014  FINDINGS: Intraoperative images demonstrate surgical fixation of the intertrochanteric fracture with a dynamic hip screw. Intramedullary nail extends into the distal femur. The right hip is located.  IMPRESSION: Internal fixation of the right femoral intertrochanteric fracture.   Electronically Signed   By: Markus Daft M.D.   On: 05/24/2014 22:43   Ct Chest W Contrast  05/24/2014   CLINICAL DATA:  Trauma with fall from ladder 6-8 feet. Unable to raise arms above head.  EXAM: CT CHEST, ABDOMEN, AND PELVIS WITH CONTRAST  TECHNIQUE: Multidetector CT imaging of the chest, abdomen and pelvis was performed following the standard protocol during bolus administration of intravenous contrast.  CONTRAST:  42mL OMNIPAQUE IOHEXOL 300 MG/ML  SOLN  COMPARISON:  CT chest 01/19/2014  FINDINGS: CT CHEST FINDINGS  The lungs are well inflated without consolidation or effusion. There is no pneumothorax. There is mild centrilobular emphysematous disease. Airways are within normal. Heart is normal in size. There is minimal calcified plaque over the thoracic aorta. Remaining mediastinal structures are unremarkable.  CT ABDOMEN AND PELVIS FINDINGS  The exam demonstrates a normal spleen, pancreas, gallbladder and adrenal glands. There is minimal stable atrophy over the anterior right lobe of the liver as a liver is otherwise unremarkable. Kidneys are within normal. There is calcified plaque over the abdominal aorta. There is no free fluid or free peritoneal air. Appendix is not seen. There is a slight increased number of small mesenteric lymph nodes. There is a 1.2 cm oval low-density focus within the mesentery of the mid abdomen left of midline just below the aortic bifurcation which may represent a small mesenteric cyst or low-density lymph node.  Pelvic images demonstrate mild bladder distention. Prostate and rectum are within normal. No free fluid.   There is a displaced intertrochanteric fracture of the right hip. There is a subtle nondisplaced fracture of the right inferior pubic ramus. Remaining bones soft tissues are unremarkable.  IMPRESSION: Displaced right intertrochanteric hip fracture. Nondisplaced fracture of the right inferior pubic ramus.  No acute findings within the chest. No acute intra-abdominal findings.  Minimal nonspecific increased number of small mesenteric lymph nodes. Recommend followup abdominal pelvic CT 3 months.  Minimal centrilobular emphysematous disease.  These results were called by telephone at the time of interpretation on 05/24/2014 at 5:28 pm to Dr. Ermalinda Memos, who verbally acknowledged these results.   Electronically Signed   By: Marin Olp M.D.   On: 05/24/2014 17:28   Ct Abdomen Pelvis W Contrast  05/24/2014   CLINICAL DATA:  Trauma with fall from ladder 6-8 feet. Unable to raise arms above head.  EXAM: CT CHEST, ABDOMEN, AND PELVIS WITH CONTRAST  TECHNIQUE: Multidetector CT imaging of the chest, abdomen and pelvis was performed following the standard protocol during bolus administration of intravenous contrast.  CONTRAST:  65mL OMNIPAQUE IOHEXOL 300 MG/ML  SOLN  COMPARISON:  CT chest 01/19/2014  FINDINGS: CT CHEST FINDINGS  The lungs are well  inflated without consolidation or effusion. There is no pneumothorax. There is mild centrilobular emphysematous disease. Airways are within normal. Heart is normal in size. There is minimal calcified plaque over the thoracic aorta. Remaining mediastinal structures are unremarkable.  CT ABDOMEN AND PELVIS FINDINGS  The exam demonstrates a normal spleen, pancreas, gallbladder and adrenal glands. There is minimal stable atrophy over the anterior right lobe of the liver as a liver is otherwise unremarkable. Kidneys are within normal. There is calcified plaque over the abdominal aorta. There is no free fluid or free peritoneal air. Appendix is not seen. There is a slight increased number  of small mesenteric lymph nodes. There is a 1.2 cm oval low-density focus within the mesentery of the mid abdomen left of midline just below the aortic bifurcation which may represent a small mesenteric cyst or low-density lymph node.  Pelvic images demonstrate mild bladder distention. Prostate and rectum are within normal. No free fluid.  There is a displaced intertrochanteric fracture of the right hip. There is a subtle nondisplaced fracture of the right inferior pubic ramus. Remaining bones soft tissues are unremarkable.  IMPRESSION: Displaced right intertrochanteric hip fracture. Nondisplaced fracture of the right inferior pubic ramus.  No acute findings within the chest. No acute intra-abdominal findings.  Minimal nonspecific increased number of small mesenteric lymph nodes. Recommend followup abdominal pelvic CT 3 months.  Minimal centrilobular emphysematous disease.  These results were called by telephone at the time of interpretation on 05/24/2014 at 5:28 pm to Dr. Ermalinda Memos, who verbally acknowledged these results.   Electronically Signed   By: Marin Olp M.D.   On: 05/24/2014 17:28   Dg Pelvis Portable  05/24/2014   CLINICAL DATA:  Patient fell 8 feet off a ladder with right hip region pain  EXAM: PORTABLE PELVIS 1-2 VIEWS  COMPARISON:  None.  FINDINGS: There is an intertrochanteric femur fracture on the right with mild varus angulation at the fracture site. Fracture extends into the greater trochanter region or fracture is comminuted. No other fractures. No dislocation. Joint spaces appear intact.  IMPRESSION: Comminuted intertrochanteric femur fracture on the right with mild varus angulation at the fracture site. No dislocation seen on this single view.   Electronically Signed   By: Lowella Grip M.D.   On: 05/24/2014 15:03   Ct 3d Recon At Scanner  05/25/2014   CLINICAL DATA:  Nonspecific (abnormal) findings on radiological and other examination of musculoskeletal system. Status post fall from  the ladder, 6-8 feet. Right intertrochanteric fracture secondary to the fall.  EXAM: 3-DIMENSIONAL CT IMAGE RENDERING ON ACQUISITION WORKSTATION  TECHNIQUE: 3-dimensional CT images were rendered by post-processing of the original CT data on an acquisition workstation. The 3-dimensional CT images were interpreted and findings were reported in the accompanying complete CT report for this study  COMPARISON:  CT chest, abdomen and pelvis 05/24/2014 at 16:51 hr  FINDINGS: As seen on the comparison examination, the patient has a right hip fracture. The fracture extends from the greater trochanter along the intertrochanteric ridge to the superior margin of the lesser trochanter. Nondisplaced right inferior pubic ramus fracture seen on CT scan is not well demonstrated on these images.  IMPRESSION: Acute right intertrochanteric fracture.  Nondisplaced right inferior pubic ramus fracture is better demonstrated on the comparison exam.   Electronically Signed   By: Inge Rise M.D.   On: 05/25/2014 07:12   Dg Chest Portable 1 View  05/24/2014   CLINICAL DATA:  Fall 6-8 feet from ladder today. Generalized  right pain. Initial encounter  EXAM: PORTABLE CHEST - 1 VIEW  COMPARISON:  CT 01/19/2014  FINDINGS: Mild hyperinflation/COPD. Lungs are clear. No pneumothorax or effusion. Heart is normal size. No visible rib fracture or other acute bony abnormality.  IMPRESSION: COPD.  No active disease.   Electronically Signed   By: Rolm Baptise M.D.   On: 05/24/2014 15:03   Dg C-arm 1-60 Min  05/24/2014   CLINICAL DATA:  Fracture the distal radius and ulna, closed. Initially counter. Fluoroscopy time 16.3 seconds.  EXAM: RIGHT WRIST - 2 VIEW; DG C-ARM 61-120 MIN  COMPARISON:  New 05/24/2014  FINDINGS: The patient has undergone screw plate fixation of the distal radius. Distal ulnar fracture is again noted.  IMPRESSION: Interval ORIF of the distal radius.   Electronically Signed   By: Shon Hale M.D.   On: 05/24/2014 22:48   Dg  C-arm 1-60 Min  05/24/2014   CLINICAL DATA:  Right femoral intertrochanteric fracture.  EXAM: DG C-ARM 61-120 MIN; RIGHT FEMUR - 2 VIEW  COMPARISON:  CT 05/24/2014  FINDINGS: Intraoperative images demonstrate surgical fixation of the intertrochanteric fracture with a dynamic hip screw. Intramedullary nail extends into the distal femur. The right hip is located.  IMPRESSION: Internal fixation of the right femoral intertrochanteric fracture.   Electronically Signed   By: Markus Daft M.D.   On: 05/24/2014 22:43     PERTINENT LAB RESULTS: CBC:  Recent Labs  05/28/14 0515 05/29/14 0524  WBC 5.3 4.8  HGB 7.9* 7.3*  HCT 23.7* 22.1*  PLT 175 220   CMET CMP     Component Value Date/Time   NA 135* 05/28/2014 0515   K 4.0 05/28/2014 0515   CL 99 05/28/2014 0515   CO2 25 05/28/2014 0515   GLUCOSE 110* 05/28/2014 0515   BUN 10 05/28/2014 0515   CREATININE 0.65 05/28/2014 0515   CALCIUM 7.8* 05/28/2014 0515   PROT 6.6 05/24/2014 1614   ALBUMIN 3.3* 05/24/2014 1614   AST 33 05/24/2014 1614   ALT 30 05/24/2014 1614   ALKPHOS 102 05/24/2014 1614   BILITOT <0.2* 05/24/2014 1614   GFRNONAA >90 05/28/2014 0515   GFRAA >90 05/28/2014 0515    GFR Estimated Creatinine Clearance: 83 mL/min (by C-G formula based on Cr of 0.65). No results for input(s): LIPASE, AMYLASE in the last 72 hours. No results for input(s): CKTOTAL, CKMB, CKMBINDEX, TROPONINI in the last 72 hours. Invalid input(s): POCBNP No results for input(s): DDIMER in the last 72 hours. No results for input(s): HGBA1C in the last 72 hours. No results for input(s): CHOL, HDL, LDLCALC, TRIG, CHOLHDL, LDLDIRECT in the last 72 hours. No results for input(s): TSH, T4TOTAL, T3FREE, THYROIDAB in the last 72 hours.  Invalid input(s): FREET3 No results for input(s): VITAMINB12, FOLATE, FERRITIN, TIBC, IRON, RETICCTPCT in the last 72 hours. Coags: No results for input(s): INR in the last 72 hours.  Invalid input(s):  PT Microbiology: No results found for this or any previous visit (from the past 240 hour(s)).   BRIEF HOSPITAL COURSE:  Hip fracture, right:secondary to a mechanical fall,underwent right hip repair with intramedullary implant on 11/4. Orthopedics following,Lovenox for DVT prophylaxis, weightbearing as tolerated. Post perioperative course complicated by blood loss anemia.Stable to be discharged to CIR today. Please ensure Ortho follows patient at CIR.  Active Problems:  Right wrist fracture:secondary to mechanical fall, underwent open reduction and internal fixation on 11/4. Defer to orthopedics-Please ensure Ortho follows patient at CIR.  Anemia:likely secondary to perioperative blood  loss, Has received a total of 3 units PRBC transfusion. Please repeat CBC on 11/10.   History of seizure disorder: change Dilantin to 200 mg BID. Obtain Dilantin level 16.7. Last seizure 1 year back.  COPD (chronic obstructive pulmonary disease):lungs clear.continue with Spiriva, and prn albuterol.  Remote Alcohol abuse:quit more than 4 years back, alcohol level <11 on admission. No signs of withdrawal.  Tobacco abuse: counseled, start transdermal nicotine.   Moderate protein calorie malnutrition: Continue supplements  TODAY-DAY OF DISCHARGE:  Subjective:   Dredyn Gubbels today has no headache,no chest abdominal pain,no new weakness tingling or numbness, feels much better wants to go home today.   Objective:   Blood pressure 107/53, pulse 72, temperature 98.1 F (36.7 C), temperature source Oral, resp. rate 16, height 5\' 7"  (1.702 m), weight 65.5 kg (144 lb 6.4 oz), SpO2 98 %.  Intake/Output Summary (Last 24 hours) at 05/29/14 1211 Last data filed at 05/29/14 1030  Gross per 24 hour  Intake    785 ml  Output    450 ml  Net    335 ml   Filed Weights   05/26/14 0500 05/27/14 0634 05/29/14 0515  Weight: 66.3 kg (146 lb 2.6 oz) 65.6 kg (144 lb 10 oz) 65.5 kg (144 lb 6.4 oz)     Exam Awake Alert, Oriented *3, No new F.N deficits, Normal affect [place].AT,PERRAL Supple Neck,No JVD, No cervical lymphadenopathy appriciated.  Symmetrical Chest wall movement, Good air movement bilaterally, CTAB RRR,No Gallops,Rubs or new Murmurs, No Parasternal Heave +ve B.Sounds, Abd Soft, Non tender, No organomegaly appriciated, No rebound -guarding or rigidity. No Cyanosis, Clubbing or edema, No new Rash or bruise  DISCHARGE CONDITION: Stable  DISPOSITION: CIR  DISCHARGE INSTRUCTIONS:    Activity:  As tolerated with Full fall precautions use walker/cane & assistance as needed  Diet recommendation: Regular Diet   Discharge Instructions    Weight bearing as tolerated    Complete by:  As directed            Follow-up Information    Follow up with Marianna Payment, MD In 2 weeks.   Specialty:  Orthopedic Surgery   Why:  For suture removal, For wound re-check   Contact information:   [place]  [id number] [phone]       Follow up with Gennette Pac, MD.   Specialty:  Family Medicine   Why:  after discharge from SNF   Contact information:   [place] [address] [phone]       Total Time spent on discharge equals 45 minutes.  SignedOren Binet 05/29/2014 12:11 PM

## 2014-05-29 NOTE — Progress Notes (Signed)
Received pt. As a transfer from [facility].Pt. And wife were oriented to unit and routine.Safety contract was sign by pt. And RN.Safety plan was explained.Pt. Does not have any skin breakdown at the moment.Keep monitoring pt. Closely and assessing his needs.

## 2014-05-29 NOTE — Progress Notes (Signed)
Physical Therapy Treatment Patient Details Name: Joshua Reed MRN: [id number] DOB: 1946/03/19 Today's Date: 05/29/2014    History of Present Illness Jafari Mckillop. [name] s/p fall off of ladder at work resulting in R hip fx and R radial fx s/p R hip IM nailing and R ORIF to radius; WBAT on RLE and WB through platform on RUE.  Pt. transfused 2 units of blood on 05/26/14.  Pt with significant PMHx of seizure d/o, and COPD.    PT Comments    Pt is progressing well with his mobility.  He should be one person assist next session for mobility and gait and reports his biggest challenge is his transition from sit to stand.  Pt is highly motivated to get better and work hard with PT.  PT will continue to follow acutely and continues to recommend CIR therapies at discharge.   Follow Up Recommendations  CIR;Supervision for mobility/OOB     Equipment Recommendations  Rolling walker with 5" wheels (R platform RW)    Recommendations for Other Services   NA     Precautions / Restrictions Restrictions RUE Weight Bearing: Non weight bearing (may WB through elbow on platform) RLE Weight Bearing: Weight bearing as tolerated    Mobility           Transfers Overall transfer level: Needs assistance Equipment used: Right platform walker Transfers: Sit to/from Stand Sit to Stand: +2 physical assistance;Min assist         General transfer comment: Two person min assit used to power up from lower recliner chair.  Verbal cues for safe hand placement and not to use right hand to pull or push  Ambulation/Gait Ambulation/Gait assistance: Min assist;+2 safety/equipment (second person chair to follow) Ambulation Distance (Feet): 85 Feet Assistive device: Right platform walker Gait Pattern/deviations: Step-to pattern;Shuffle;Antalgic Gait velocity: decreased Gait velocity interpretation: <1.8 ft/sec, indicative of risk for recurrent falls General Gait Details: Pt with slow, short, antalgic steps.   Verbal and manual cues for safe RW use (not to get too close to the front).  Chair to follow to encourage increased gait distance and for safety as pt is recieving blood for low Hgb levels.           Balance Overall balance assessment: Needs assistance Sitting-balance support: Feet supported;Single extremity supported Sitting balance-Leahy Scale: Poor   Postural control: Posterior lean Standing balance support: Bilateral upper extremity supported Standing balance-Leahy Scale: Poor                      Cognition Arousal/Alertness: Awake/alert Behavior During Therapy: WFL for tasks assessed/performed Overall Cognitive Status: Within Functional Limits for tasks assessed                      Exercises General Exercises - Lower Extremity Ankle Circles/Pumps: AROM;Both;20 reps;Supine Quad Sets: AROM;Right;10 reps;Supine Short Arc Quad: AROM;Right;10 reps;Supine Long Arc Quad: AROM;Right;10 reps;Supine Heel Slides: AAROM;Right;10 reps;Supine Hip ABduction/ADduction: AAROM;Right;10 reps;Supine        Pertinent Vitals/Pain Pain Assessment: 0-10 Pain Score: 7  Pain Location: right hip, right wrist Pain Descriptors / Indicators: Aching;Burning Pain Intervention(s): Limited activity within patient's tolerance;Monitored during session;Repositioned;Ice applied           PT Goals (current goals can now be found in the care plan section) Acute Rehab PT Goals Patient Stated Goal: to move better and have my arm hurt less Progress towards PT goals: Progressing toward goals    Frequency  Min 5X/week  PT Plan Current plan remains appropriate       End of Session Equipment Utilized During Treatment: Gait belt Activity Tolerance: Patient limited by pain Patient left: in chair;with call bell/phone within reach;with family/visitor present     Time: 4270-6237 PT Time Calculation (min): 31 min  Charges:  $Gait Training: 8-22 mins $Therapeutic Exercise: 8-22  mins                      Maleta Pacha B. Itzel Mckibbin, PT, DPT [phone]   05/29/2014, 12:17 PM

## 2014-05-29 NOTE — Progress Notes (Signed)
PATIENT DETAILS Name: Joshua Reed Age: 68 y.o. Sex: male Date of Birth: [birth year] Admit Date: 05/24/2014 Admitting Physician Thurnell Lose, MD [id number]:QBHALP,[id number] Marigene Ehlers, MD  Subjective: No major issues, pain in right hand/hip improving, awaiting SNF bed  Assessment/Plan: Principal Problem:   Hip fracture, right:secondary to a mechanical fall,underwent right hip repair with intramedullary implant on 11/4. Orthopedics following,Lovenox for DVT prophylaxis, weightbearing as tolerated. Post perioperative course complicated by blood loss anemia, will follow Hb.But stable to be discharged to CIR today. If no bed available in CIR-then SNF tomorrow.  Active Problems:   Right wrist fracture:secondary to mechanical fall, underwent open reduction and internal fixation on 11/4. Defer to orthopedics.   Anemia:likely secondary to perioperative blood loss, 2 units PRBC transfusion 11/6. Hb today 7.3, will transfuse 1 more unit on 11/9-repeat CBC in am   History of seizure disorder: change Dilantin to 200 mg BID. Obtain Dilantin level 16.7. Last seizure 1 year back.   COPD (chronic obstructive pulmonary disease):lungs clear.continue with Spiriva, and prn albuterol.   Remote Alcohol abuse:quit more than 4 years back, alcohol level <11 on admission. No signs of withdrawal.   Tobacco abuse: counseled, start transdermal nicotine.    Moderate protein calorie malnutrition: Continue supplements  Disposition: Remain inpatient-if bed available ok to go to CIR today, otherwise SNF tomorrow.  Antibiotics:  None  DVT Prophylaxis: Prophylactic Lovenox   Code Status: Full code   Family Communication Spouse at bedside  Procedures:  Right hip/right wrist repair on 11/4  CONSULTS:  orthopedic surgery   MEDICATIONS: Scheduled Meds: . sodium chloride   Intravenous Once  . cholecalciferol  1,000 Units Oral Daily  . enoxaparin (LOVENOX) injection  40 mg Subcutaneous Q24H  .  ferrous sulfate  325 mg Oral BID WC  . furosemide  20 mg Intravenous Once  . Influenza vac split quadrivalent PF  0.5 mL Intramuscular Tomorrow-1000  . lactose free nutrition  237 mL Oral BID BM  . nicotine  21 mg Transdermal Daily  . phenytoin  200 mg Oral BID  . senna  1 tablet Oral BID  . tiotropium  18 mcg Inhalation Daily   Continuous Infusions:  PRN Meds:.acetaminophen **OR** acetaminophen, albuterol, alum & mag hydroxide-simeth, guaiFENesin-dextromethorphan, HYDROcodone-acetaminophen, menthol-cetylpyridinium **OR** phenol, methocarbamol **OR** methocarbamol (ROBAXIN)  IV, metoCLOPramide **OR** metoCLOPramide (REGLAN) injection, metoprolol, morphine injection, ondansetron **OR** ondansetron (ZOFRAN) IV, oxyCODONE, polyethylene glycol, sorbitol  Antibiotics: Anti-infectives    Start     Dose/Rate Route Frequency Ordered Stop   05/25/14 0200  ceFAZolin (ANCEF) IVPB 2 g/50 mL premix     2 g100 mL/hr over 30 Minutes Intravenous Every 6 hours 05/24/14 2339 05/25/14 1630       PHYSICAL EXAM: Vital signs in last 24 hours: Filed Vitals:   05/29/14 0000 05/29/14 0400 05/29/14 0515 05/29/14 0800  BP:   101/58   Pulse:   77   Temp:   98.3 F (36.8 C)   TempSrc:   Oral   Resp: 19 18 18 18   Height:      Weight:   65.5 kg (144 lb 6.4 oz)   SpO2: 99% 96% 98% 98%    Weight change:  Filed Weights   05/26/14 0500 05/27/14 0634 05/29/14 0515  Weight: 66.3 kg (146 lb 2.6 oz) 65.6 kg (144 lb 10 oz) 65.5 kg (144 lb 6.4 oz)   Body mass index is 22.61 kg/(m^2).   Gen Exam: Awake and alert with clear speech.  Not in any distress Neck: Supple, No JVD.   Chest: B/L Clear. No rhonchi/rales CVS: S1 S2 Regular, no murmurs.  Abdomen: soft, BS +, non tender, non distended.  Extremities: no edema, lower extremities warm to touch.Right finger tips-good capillary refill. Neurologic: Non Focal.  Skin: No Rash.   Wounds: N/A.    Intake/Output from previous day:  Intake/Output Summary (Last 24  hours) at 05/29/14 1009 Last data filed at 05/29/14 0515  Gross per 24 hour  Intake    240 ml  Output    450 ml  Net   -210 ml     LAB RESULTS: CBC  Recent Labs Lab 05/26/14 0600 05/26/14 2219 05/27/14 0509 05/28/14 0515 05/29/14 0524  WBC 7.8 7.3 7.1 5.3 4.8  HGB 6.5* 8.3* 8.1* 7.9* 7.3*  HCT 19.1* 24.5* 23.8* 23.7* 22.1*  PLT 170 162 166 175 220  MCV 85.7 82.2 82.9 84.0 85.3  MCH 29.1 27.9 28.2 28.0 28.2  MCHC 34.0 33.9 34.0 33.3 33.0  RDW 16.0* 16.1* 16.3* 16.1* 16.1*    Chemistries   Recent Labs Lab 05/24/14 1614 05/24/14 1624 05/25/14 0025 05/28/14 0515  NA 136* 135* 135* 135*  K 4.8 4.4 4.7 4.0  CL 101 100 99 99  CO2 23  --  26 25  GLUCOSE 98 100* 180* 110*  BUN 12 11 10 10   CREATININE 0.77 0.80 0.67 0.65  CALCIUM 7.8*  --  7.7* 7.8*    CBG:  Recent Labs Lab 05/26/14 2204  GLUCAP 162*    GFR Estimated Creatinine Clearance: 83 mL/min (by C-G formula based on Cr of 0.65).  Coagulation profile  Recent Labs Lab 05/24/14 1614 05/25/14 0025  INR 1.23 1.31    Cardiac Enzymes No results for input(s): CKMB, TROPONINI, MYOGLOBIN in the last 168 hours.  Invalid input(s): CK  Invalid input(s): POCBNP No results for input(s): DDIMER in the last 72 hours. No results for input(s): HGBA1C in the last 72 hours. No results for input(s): CHOL, HDL, LDLCALC, TRIG, CHOLHDL, LDLDIRECT in the last 72 hours. No results for input(s): TSH, T4TOTAL, T3FREE, THYROIDAB in the last 72 hours.  Invalid input(s): FREET3 No results for input(s): VITAMINB12, FOLATE, FERRITIN, TIBC, IRON, RETICCTPCT in the last 72 hours. No results for input(s): LIPASE, AMYLASE in the last 72 hours.  Urine Studies No results for input(s): UHGB, CRYS in the last 72 hours.  Invalid input(s): UACOL, UAPR, USPG, UPH, UTP, UGL, UKET, UBIL, UNIT, UROB, ULEU, UEPI, UWBC, URBC, UBAC, CAST, UCOM, BILUA  MICROBIOLOGY: No results found for this or any previous visit (from the past 240  hour(s)).  RADIOLOGY STUDIES/RESULTS: Dg Wrist 2 Views Right  05/24/2014   CLINICAL DATA:  Fracture the distal radius and ulna, closed. Initially counter. Fluoroscopy time 16.3 seconds.  EXAM: RIGHT WRIST - 2 VIEW; DG C-ARM 61-120 MIN  COMPARISON:  New 05/24/2014  FINDINGS: The patient has undergone screw plate fixation of the distal radius. Distal ulnar fracture is again noted.  IMPRESSION: Interval ORIF of the distal radius.   Electronically Signed   By: Shon Hale M.D.   On: 05/24/2014 22:48   Dg Wrist 2 Views Right  05/24/2014   CLINICAL DATA:  Post reduction wrist fracture.  EXAM: RIGHT WRIST - 2 VIEW  COMPARISON:  05/24/2014  FINDINGS: Wrist is imaged through splinting material. There are comminuted fractures of the distal radius and ulna. There has been interval reduction with significant improvement in the angulation of the radiocarpal joint. Intercarpal spaces are  grossly normal. Nondisplaced carpal fractures would be difficult to exclude but are not identified.  IMPRESSION: 1. Interval reduction of wrist fractures. 2. Significantly improved radiocarpal angulation.   Electronically Signed   By: Shon Hale M.D.   On: 05/24/2014 18:41   Dg Wrist 2 Views Right  05/24/2014   CLINICAL DATA:  [place] off ladder today from 10 feet. Deformity of right wrist. Right wrist pain. Initial encounter.  EXAM: RIGHT WRIST - 2 VIEW  COMPARISON:  None.  FINDINGS: Study is limited due to patient condition and limited positioning. Comminuted fracture noted through the distal right radius with impaction. No definite dislocation. Ulnar styloid fracture. Diffuse soft tissue swelling noted.  IMPRESSION: Comminuted, impacted distal right radial fracture. Distal ulnar/ulnar styloid fracture.   Electronically Signed   By: Rolm Baptise M.D.   On: 05/24/2014 15:49   Dg Femur Right  05/24/2014   CLINICAL DATA:  Right femoral intertrochanteric fracture.  EXAM: DG C-ARM 61-120 MIN; RIGHT FEMUR - 2 VIEW  COMPARISON:  CT  05/24/2014  FINDINGS: Intraoperative images demonstrate surgical fixation of the intertrochanteric fracture with a dynamic hip screw. Intramedullary nail extends into the distal femur. The right hip is located.  IMPRESSION: Internal fixation of the right femoral intertrochanteric fracture.   Electronically Signed   By: Markus Daft M.D.   On: 05/24/2014 22:43   Ct Chest W Contrast  05/24/2014   CLINICAL DATA:  Trauma with fall from ladder 6-8 feet. Unable to raise arms above head.  EXAM: CT CHEST, ABDOMEN, AND PELVIS WITH CONTRAST  TECHNIQUE: Multidetector CT imaging of the chest, abdomen and pelvis was performed following the standard protocol during bolus administration of intravenous contrast.  CONTRAST:  81mL OMNIPAQUE IOHEXOL 300 MG/ML  SOLN  COMPARISON:  CT chest 01/19/2014  FINDINGS: CT CHEST FINDINGS  The lungs are well inflated without consolidation or effusion. There is no pneumothorax. There is mild centrilobular emphysematous disease. Airways are within normal. Heart is normal in size. There is minimal calcified plaque over the thoracic aorta. Remaining mediastinal structures are unremarkable.  CT ABDOMEN AND PELVIS FINDINGS  The exam demonstrates a normal spleen, pancreas, gallbladder and adrenal glands. There is minimal stable atrophy over the anterior right lobe of the liver as a liver is otherwise unremarkable. Kidneys are within normal. There is calcified plaque over the abdominal aorta. There is no free fluid or free peritoneal air. Appendix is not seen. There is a slight increased number of small mesenteric lymph nodes. There is a 1.2 cm oval low-density focus within the mesentery of the mid abdomen left of midline just below the aortic bifurcation which may represent a small mesenteric cyst or low-density lymph node.  Pelvic images demonstrate mild bladder distention. Prostate and rectum are within normal. No free fluid.  There is a displaced intertrochanteric fracture of the right hip. There is  a subtle nondisplaced fracture of the right inferior pubic ramus. Remaining bones soft tissues are unremarkable.  IMPRESSION: Displaced right intertrochanteric hip fracture. Nondisplaced fracture of the right inferior pubic ramus.  No acute findings within the chest. No acute intra-abdominal findings.  Minimal nonspecific increased number of small mesenteric lymph nodes. Recommend followup abdominal pelvic CT 3 months.  Minimal centrilobular emphysematous disease.  These results were called by telephone at the time of interpretation on 05/24/2014 at 5:28 pm to Dr. Ermalinda Memos, who verbally acknowledged these results.   Electronically Signed   By: Marin Olp M.D.   On: 05/24/2014 17:28   Ct Abdomen  Pelvis W Contrast  05/24/2014   CLINICAL DATA:  Trauma with fall from ladder 6-8 feet. Unable to raise arms above head.  EXAM: CT CHEST, ABDOMEN, AND PELVIS WITH CONTRAST  TECHNIQUE: Multidetector CT imaging of the chest, abdomen and pelvis was performed following the standard protocol during bolus administration of intravenous contrast.  CONTRAST:  45mL OMNIPAQUE IOHEXOL 300 MG/ML  SOLN  COMPARISON:  CT chest 01/19/2014  FINDINGS: CT CHEST FINDINGS  The lungs are well inflated without consolidation or effusion. There is no pneumothorax. There is mild centrilobular emphysematous disease. Airways are within normal. Heart is normal in size. There is minimal calcified plaque over the thoracic aorta. Remaining mediastinal structures are unremarkable.  CT ABDOMEN AND PELVIS FINDINGS  The exam demonstrates a normal spleen, pancreas, gallbladder and adrenal glands. There is minimal stable atrophy over the anterior right lobe of the liver as a liver is otherwise unremarkable. Kidneys are within normal. There is calcified plaque over the abdominal aorta. There is no free fluid or free peritoneal air. Appendix is not seen. There is a slight increased number of small mesenteric lymph nodes. There is a 1.2 cm oval low-density focus  within the mesentery of the mid abdomen left of midline just below the aortic bifurcation which may represent a small mesenteric cyst or low-density lymph node.  Pelvic images demonstrate mild bladder distention. Prostate and rectum are within normal. No free fluid.  There is a displaced intertrochanteric fracture of the right hip. There is a subtle nondisplaced fracture of the right inferior pubic ramus. Remaining bones soft tissues are unremarkable.  IMPRESSION: Displaced right intertrochanteric hip fracture. Nondisplaced fracture of the right inferior pubic ramus.  No acute findings within the chest. No acute intra-abdominal findings.  Minimal nonspecific increased number of small mesenteric lymph nodes. Recommend followup abdominal pelvic CT 3 months.  Minimal centrilobular emphysematous disease.  These results were called by telephone at the time of interpretation on 05/24/2014 at 5:28 pm to Dr. Ermalinda Memos, who verbally acknowledged these results.   Electronically Signed   By: Marin Olp M.D.   On: 05/24/2014 17:28   Dg Pelvis Portable  05/24/2014   CLINICAL DATA:  Patient fell 8 feet off a ladder with right hip region pain  EXAM: PORTABLE PELVIS 1-2 VIEWS  COMPARISON:  None.  FINDINGS: There is an intertrochanteric femur fracture on the right with mild varus angulation at the fracture site. Fracture extends into the greater trochanter region or fracture is comminuted. No other fractures. No dislocation. Joint spaces appear intact.  IMPRESSION: Comminuted intertrochanteric femur fracture on the right with mild varus angulation at the fracture site. No dislocation seen on this single view.   Electronically Signed   By: Lowella Grip M.D.   On: 05/24/2014 15:03   Ct 3d Recon At Scanner  05/25/2014   CLINICAL DATA:  Nonspecific (abnormal) findings on radiological and other examination of musculoskeletal system. Status post fall from the ladder, 6-8 feet. Right intertrochanteric fracture secondary to the  fall.  EXAM: 3-DIMENSIONAL CT IMAGE RENDERING ON ACQUISITION WORKSTATION  TECHNIQUE: 3-dimensional CT images were rendered by post-processing of the original CT data on an acquisition workstation. The 3-dimensional CT images were interpreted and findings were reported in the accompanying complete CT report for this study  COMPARISON:  CT chest, abdomen and pelvis 05/24/2014 at 16:51 hr  FINDINGS: As seen on the comparison examination, the patient has a right hip fracture. The fracture extends from the greater trochanter along the intertrochanteric ridge to  the superior margin of the lesser trochanter. Nondisplaced right inferior pubic ramus fracture seen on CT scan is not well demonstrated on these images.  IMPRESSION: Acute right intertrochanteric fracture.  Nondisplaced right inferior pubic ramus fracture is better demonstrated on the comparison exam.   Electronically Signed   By: Inge Rise M.D.   On: 05/25/2014 07:12   Dg Chest Portable 1 View  05/24/2014   CLINICAL DATA:  Fall 6-8 feet from ladder today. Generalized right pain. Initial encounter  EXAM: PORTABLE CHEST - 1 VIEW  COMPARISON:  CT 01/19/2014  FINDINGS: Mild hyperinflation/COPD. Lungs are clear. No pneumothorax or effusion. Heart is normal size. No visible rib fracture or other acute bony abnormality.  IMPRESSION: COPD.  No active disease.   Electronically Signed   By: Rolm Baptise M.D.   On: 05/24/2014 15:03   Dg C-arm 1-60 Min  05/24/2014   CLINICAL DATA:  Fracture the distal radius and ulna, closed. Initially counter. Fluoroscopy time 16.3 seconds.  EXAM: RIGHT WRIST - 2 VIEW; DG C-ARM 61-120 MIN  COMPARISON:  New 05/24/2014  FINDINGS: The patient has undergone screw plate fixation of the distal radius. Distal ulnar fracture is again noted.  IMPRESSION: Interval ORIF of the distal radius.   Electronically Signed   By: Shon Hale M.D.   On: 05/24/2014 22:48   Dg C-arm 1-60 Min  05/24/2014   CLINICAL DATA:  Right femoral  intertrochanteric fracture.  EXAM: DG C-ARM 61-120 MIN; RIGHT FEMUR - 2 VIEW  COMPARISON:  CT 05/24/2014  FINDINGS: Intraoperative images demonstrate surgical fixation of the intertrochanteric fracture with a dynamic hip screw. Intramedullary nail extends into the distal femur. The right hip is located.  IMPRESSION: Internal fixation of the right femoral intertrochanteric fracture.   Electronically Signed   By: Markus Daft M.D.   On: 05/24/2014 22:43    Oren Binet, MD  [facility] Pager:[phone]  If 7PM-7AM, please contact night-coverage [email or web address] Password [email or web address] 05/29/2014, 10:09 AM   LOS: 5 days

## 2014-05-29 NOTE — Progress Notes (Signed)
[facility] Admission Coordinator Signed Physical Medicine and Rehabilitation PMR Pre-admission 05/29/2014 9:46 AM  Related encounter: ED to Hosp-Admission (Current) from 05/24/2014 in [place] Collapse All   PMR Admission Coordinator Pre-Admission Assessment  Patient: Joshua Reed is an 68 y.o., male MRN: [id number] DOB: [birth year] Height: '5\' 7"'  (170.2 cm) Weight: 65.5 kg (144 lb 6.4 oz)  Insurance Information  PRIMARY: Worker's Comp. - [place] Case Management Policy#: Subscriber: self CM Name: Cathrine Muster, RN Phone#: [phone] Or office: [phone], [phone] Fax#: [phone] Updates due to Clarene Critchley on 04-02-14 to above fax. She would like updates every Friday (9-13, 9-20, etc) Employer: [employer] CIR: covered SNF: covered Outpatient: covered Co-Pay:  Home Health: covered Co-Pay:  DME: covered Co-Pay:  Providers: per worker's comp.  Emergency Contact Information Contact Information    Name Relation Home Work Mobile   [place] Spouse [phone]  [phone]   Sherron Ales Daughter   [phone]     Current Medical History  Patient Admitting Diagnosis: right distal radius fx, right intertrochanteric fx after fall  History of Present Illness: OLEN EAVES is a 68 y.o. male with history of COPD, alcohol abuse, macular degeneration, who was working on a house on 05/24/14 when he slipped and fell 6-8 feet off a ladder. He hit his right side on concrete with onset of right hip pain and right wrist deformity. Work up in ED revealed right distal radius fracture and right IT hip fracture. He was evaluated by Dr. Erlinda Hong and right wrist was manipulated and splinted in ED and he underwent IM nail of  right hip fracture the same day. Post op WBAT on RLE and weight bearing through right elbow. PT/OT evaluations done yesterday and CIR recommended for follow up therapy. Follow up labs with worsening of ABLA with drop in H/H to 6.5. Hgb is 7.3 on 05-29-14 with pt receiving one unit PRBC's today prior to admit to inpatient rehab.    Past Medical History  Past Medical History  Diagnosis Date  . Seizure disorder 2205    ALCOHOL RELATED  . Macular degeneration   . COPD (chronic obstructive pulmonary disease)   . Alcohol abuse   . Vitamin D deficiency     Family History  family history includes Heart attack in his father.  Prior Rehab/Hospitalizations: none  Current Medications  Current facility-administered medications: 0.9 % sodium chloride infusion, , Intravenous, Once, Shanker Kristeen Mans, MD; acetaminophen (TYLENOL) tablet 650 mg, 650 mg, Oral, Q6H PRN **OR** acetaminophen (TYLENOL) suppository 650 mg, 650 mg, Rectal, Q6H PRN, Naiping Eduard Roux, MD; albuterol (PROVENTIL) (2.5 MG/3ML) 0.083% nebulizer solution 2.5 mg, 2.5 mg, Nebulization, Q4H PRN, Thurnell Lose, MD, 2.5 mg at 05/27/14 2143 alum & mag hydroxide-simeth (MAALOX/MYLANTA) 200-200-20 MG/5ML suspension 30 mL, 30 mL, Oral, Q4H PRN, Naiping Eduard Roux, MD; cholecalciferol (VITAMIN D) tablet 1,000 Units, 1,000 Units, Oral, Daily, Thurnell Lose, MD, 1,000 Units at 05/29/14 0950; enoxaparin (LOVENOX) injection 40 mg, 40 mg, Subcutaneous, Q24H, Naiping Eduard Roux, MD, 40 mg at 05/29/14 0941 ferrous sulfate tablet 325 mg, 325 mg, Oral, BID WC, Jonetta Osgood, MD, 325 mg at 05/29/14 0800; furosemide (LASIX) injection 20 mg, 20 mg, Intravenous, Once, Shanker Kristeen Mans, MD; guaiFENesin-dextromethorphan (ROBITUSSIN DM) 100-10 MG/5ML syrup 5 mL, 5 mL, Oral, Q4H PRN, Thurnell Lose, MD; HYDROcodone-acetaminophen (NORCO/VICODIN) 5-325 MG per tablet 1-2 tablet, 1-2 tablet, Oral, Q6H PRN, Naiping  Eduard Roux, MD, 2 tablet at  05/28/14 1103 Influenza vac split quadrivalent PF (FLUARIX) injection 0.5 mL, 0.5 mL, Intramuscular, Tomorrow-1000, Thurnell Lose, MD, 0.5 mL at 05/26/14 1000; lactose free nutrition (BOOST PLUS) liquid 237 mL, 237 mL, Oral, BID BM, Stephanie La, RD, 237 mL at 05/29/14 1000; menthol-cetylpyridinium (CEPACOL) lozenge 3 mg, 1 lozenge, Oral, PRN **OR** phenol (CHLORASEPTIC) mouth spray 1 spray, 1 spray, Mouth/Throat, PRN, Naiping Eduard Roux, MD methocarbamol (ROBAXIN) tablet 500 mg, 500 mg, Oral, Q6H PRN, 500 mg at 05/29/14 0602 **OR** methocarbamol (ROBAXIN) 500 mg in dextrose 5 % 50 mL IVPB, 500 mg, Intravenous, Q6H PRN, Naiping Eduard Roux, MD; metoCLOPramide (REGLAN) tablet 5-10 mg, 5-10 mg, Oral, Q8H PRN **OR** metoCLOPramide (REGLAN) injection 5-10 mg, 5-10 mg, Intravenous, Q8H PRN, Naiping Eduard Roux, MD metoprolol (LOPRESSOR) injection 5 mg, 5 mg, Intravenous, Q4H PRN, Thurnell Lose, MD; morphine 2 MG/ML injection 0.5 mg, 0.5 mg, Intravenous, Q2H PRN, Naiping Eduard Roux, MD, 0.5 mg at 05/28/14 2233; nicotine (NICODERM CQ - dosed in mg/24 hours) patch 21 mg, 21 mg, Transdermal, Daily, Jonetta Osgood, MD, 21 mg at 05/29/14 0948 ondansetron (ZOFRAN) tablet 4 mg, 4 mg, Oral, Q6H PRN **OR** ondansetron (ZOFRAN) injection 4 mg, 4 mg, Intravenous, Q6H PRN, Naiping Eduard Roux, MD; oxyCODONE (Oxy IR/ROXICODONE) immediate release tablet 5-10 mg, 5-10 mg, Oral, Q4H PRN, Marianna Payment, MD, 10 mg at 05/29/14 0602; phenytoin (DILANTIN) ER capsule 200 mg, 200 mg, Oral, BID, Jonetta Osgood, MD, 200 mg at 05/29/14 0941 polyethylene glycol (MIRALAX / GLYCOLAX) packet 17 g, 17 g, Oral, Daily PRN, Naiping Eduard Roux, MD; senna [facility]) tablet 8.6 mg, 1 tablet, Oral, BID, Naiping Eduard Roux, MD, 8.6 mg at 05/29/14 0941; sorbitol 70 % solution 30 mL, 30 mL, Oral, Daily PRN, Marianna Payment, MD; tiotropium [facility]) inhalation capsule 18 mcg, 18 mcg, Inhalation, Daily,  Thurnell Lose, MD, 18 mcg at 05/28/14 0849  Patients Current Diet: Diet regular  Precautions / Restrictions Precautions Precautions: Fall Restrictions Weight Bearing Restrictions: Yes RUE Weight Bearing: Non weight bearing RLE Weight Bearing: Weight bearing as tolerated   Prior Activity Level Community (5-7x/wk): 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 Pt enjoyed golf and softball until a few years ago.   Home Assistive Devices / Equipment Home Assistive Devices/Equipment: None  Prior Functional Level Prior Function Level of Independence: Independent Comments: Pt works for [employer]. Fall occurred at work  Current Functional Level Cognition  Overall Cognitive Status: Within Functional Limits for tasks assessed Orientation Level: Oriented X4   Extremity Assessment (includes Sensation/Coordination)          ADLs  Overall ADL's : Needs assistance/impaired Eating/Feeding: Set up, Sitting Eating/Feeding Details (indicate cue type and reason): Pt requires assistance to open containers and cut food.  Grooming: Set up, Sitting Upper Body Bathing: Minimal assitance, Sitting Upper Body Bathing Details (indicate cue type and reason): pt unable to maintain balance sitting on edge of chair without UE support and will require assistance Lower Body Bathing: Moderate assistance, Sit to/from stand Lower Body Bathing Details (indicate cue type and reason): Pt able to help with cleaning bottom in standing while maintaining balance Upper Body Dressing : Moderate assistance, Sitting Upper Body Dressing Details (indicate cue type and reason): (A) for balance in sitting and to thread RUE Lower Body Dressing: Total assistance, Sit to/from stand Toilet Transfer: Moderate assistance, Stand-pivot (gait belt assisted transfer) Functional mobility during ADLs: Minimal  assistance, Rolling walker, Cueing for sequencing, Cueing for safety General ADL Comments: Making steady progress. Able to ambulate @ room with platform rw during ADL. Cues for sequencing [place] eof RW    Mobility  Overal bed mobility: Needs Assistance Bed Mobility: Supine to Sit Supine to sit: +2 for physical assistance, Min assist Sit to supine: Min assist General bed mobility comments: min assist for upper body and second person to assist R LE    Transfers  Overall transfer level: Needs assistance Equipment used: Right platform walker Transfers: Sit to/from Stand Sit to Stand: Min assist, +2 safety/equipment Stand pivot transfers: Min assist General transfer comment: min assist to rise mostly on left LE and for balance; second person for safety to assist with equipment    Ambulation / Gait / Stairs / Wheelchair Mobility  Ambulation/Gait Ambulation/Gait assistance: Mod assist, +2 safety/equipment Ambulation Distance (Feet): 10 Feet Assistive device: Right platform walker Gait Pattern/deviations: Step-to pattern Gait velocity: decreased  General Gait Details: Pt. unable to advance RW and needed mod assist to do so; moving R LE slightly better today. Denies dizziness, fatigues early    Posture / Balance Dynamic Sitting Balance Sitting balance - Comments: requires use of LUE for support due to pain on R side of body    Special needs/care consideration BiPAP/CPAP no CPM no  Continuous Drip IV no  Dialysis no  Life Vest no  Oxygen no  Special Bed no  Trach Size no  Wound Vac (area) no  Skin - current R UE foreman cast and R hip incision from sx.  Bowel mgmt: last BM on 05-28-14 Bladder mgmt: currently using urinal Diabetic mgmt no   Previous Home Environment Living Arrangements: Spouse/significant other Home Care Services: No Additional Comments: Pt's wife is very anxious in regards to d/c planning. She does  not feel ready to take care of him and verbalizes fear of him falling. They would like rehab (CIR or SNF) prior to d/c home. Therapist in agreement   Discharge Living Setting Plans for Discharge Living Setting: Patient's home Type of Home at Discharge: House Discharge Home Layout: One level Discharge Home Access: Stairs to enter Entrance Stairs-Rails: None Entrance Stairs-Number of Steps: 3 steps to front porch; 2 steps to back sliding door Does the patient have any problems obtaining your medications?: No  Social/Family/Support Systems Patient Roles: Spouse, Other (Comment) (works full time in heating & air. Pt is an involved grand-fa) Contact Information: wife [name] is primary contact Anticipated Caregiver: wife Anticipated Caregiver's Contact Information: see above Ability/Limitations of Caregiver: no limitations Caregiver Availability: 24/7 Discharge Plan Discussed with Primary Caregiver: Yes Is Caregiver In Agreement with Plan?: Yes Does Caregiver/Family have Issues with Lodging/Transportation while Pt is in Rehab?: No   Note: pt's wife is very supportive and a bit anxious about bringing him home.  Goals/Additional Needs Patient/Family Goal for Rehab: Supervision and Mod Indep. with PT; Supervision and mIn assist with OT; NA SLP Expected length of stay: 10-14 days Cultural Considerations: none Dietary Needs: regular diet Equipment Needs: to be determined Pt/Family Agrees to Admission and willing to participate: Yes Program Orientation Provided & Reviewed with Pt/Caregiver Including Roles & Responsibilities: Yes (met with pt and his wife on 11-6 and 11-9)   Decrease burden of Care through IP rehab admission: NA   Possible need for SNF placement upon discharge: not anticipated   Patient Condition: This patient's medical and functional status has changed since the consult dated: 05-26-14 in which the Rehabilitation Physician determined and  documented that the patient's  condition is appropriate for intensive rehabilitative care in an inpatient rehabilitation facility. See "History of Present Illness" (above) for medical update. Functional changes are: moderate assist of two to ambulate 10' and moderate assistance with self care skills. Patient's medical and functional status update has been discussed with the Rehabilitation physician and patient remains appropriate for inpatient rehabilitation. Will admit to inpatient rehab today.  Preadmission Screen Completed By: Nanetta Batty, PT, 05/29/2014 10:55 AM ______________________________________________________________________  Discussed status with Dr. Naaman Plummer on 05-29-14 at 1055 and received telephone approval for admission today.  Admission Coordinator: Nanetta Batty, PT, time1055/Date 05-29-14          Cosigned by: [name], MD at 05/29/2014 11:00 AM  Revision History     Date/Time User Provider Type Action   05/29/2014 11:00 AM [name], MD Physician Cosign   05/29/2014 10:57 AM [facility] Admission Coordinator Sign

## 2014-05-29 NOTE — PMR Pre-admission (Signed)
PMR Admission Coordinator Pre-Admission Assessment  Patient: Joshua Reed is an 68 y.o., male MRN: [id number] DOB: Aug 14, 1945 Height: _0  (170.2 cm) Weight: 65.5 kg (144 lb 6.4 oz)              Insurance Information  PRIMARY: Worker's Comp. - [place] Case [employer]      Policy#:       Subscriber: self CM Name: Cathrine Muster, RN      Phone#: [phone]  Or office: [phone], ext [phone]   Fax#: [phone] Updates due to Clarene Critchley on 04-02-14 to above fax. She would like updates every Friday (9-13, 9-20, etc) Employer: [employer] CIR: covered       SNF: covered Outpatient: covered     Co-Pay:  Home Health: covered      Co-Pay:  DME: covered     Co-Pay:  Providers: per worker's comp.  Emergency Contact Information Contact Information    Name Relation Home Work Mobile   [place] Spouse [phone]  [phone]   Sherron Ales Daughter   [phone]     Current Medical History  Patient Admitting Diagnosis: right distal radius fx, right intertrochanteric fx after fall  History of Present Illness: Joshua Reed is a 68 y.o. male with history of COPD, alcohol abuse, macular degeneration, who was working on a house on 05/24/14 when he slipped and fell 6-8 feet off a ladder. He hit his right side on concrete with onset of right hip pain and right wrist deformity. Work up in ED revealed right distal radius fracture and right IT hip fracture. He was evaluated by Dr. Erlinda Hong and right wrist was manipulated and splinted in ED and he underwent IM nail of right hip fracture the same day. Post op WBAT on RLE and weight bearing through right elbow. PT/OT evaluations done yesterday and CIR recommended for follow up therapy. Follow up labs with  worsening of ABLA with drop in H/H to 6.5. Hgb is 7.3 on 05-29-14 with pt receiving one unit PRBC's today prior to admit to inpatient rehab.    Past Medical History  Past Medical History  Diagnosis Date  . Seizure disorder 2205    ALCOHOL  RELATED  . Macular degeneration   . COPD (chronic obstructive pulmonary disease)   . Alcohol abuse   . Vitamin D deficiency     Family History  family history includes Heart attack in his father.  Prior Rehab/Hospitalizations: none   Current Medications  Current facility-administered medications: 0.9 %  sodium chloride infusion, , Intravenous, Once, Shanker Kristeen Mans, MD;  acetaminophen (TYLENOL) tablet 650 mg, 650 mg, Oral, Q6H PRN **OR** acetaminophen (TYLENOL) suppository 650 mg, 650 mg, Rectal, Q6H PRN, Naiping Eduard Roux, MD;  albuterol (PROVENTIL) (2.5 MG/3ML) 0.083% nebulizer solution 2.5 mg, 2.5 mg, Nebulization, Q4H PRN, Thurnell Lose, MD, 2.5 mg at 05/27/14 2143 alum & mag hydroxide-simeth (MAALOX/MYLANTA) 200-200-20 MG/5ML suspension 30 mL, 30 mL, Oral, Q4H PRN, Naiping Eduard Roux, MD;  cholecalciferol (VITAMIN D) tablet 1,000 Units, 1,000 Units, Oral, Daily, Thurnell Lose, MD, 1,000 Units at 05/29/14 0950;  enoxaparin (LOVENOX) injection 40 mg, 40 mg, Subcutaneous, Q24H, Naiping Eduard Roux, MD, 40 mg at 05/29/14 0941 ferrous sulfate tablet 325 mg, 325 mg, Oral, BID WC, Jonetta Osgood, MD, 325 mg at 05/29/14 0800;  furosemide (LASIX) injection 20 mg, 20 mg, Intravenous, Once, Shanker Kristeen Mans, MD;  guaiFENesin-dextromethorphan (ROBITUSSIN DM) 100-10 MG/5ML syrup 5 mL, 5 mL, Oral, Q4H PRN, Thurnell Lose, MD;  HYDROcodone-acetaminophen (NORCO/VICODIN) 5-325  MG per tablet 1-2 tablet, 1-2 tablet, Oral, Q6H PRN, Marianna Payment, MD, 2 tablet at 05/28/14 1103 Influenza vac split quadrivalent PF (FLUARIX) injection 0.5 mL, 0.5 mL, Intramuscular, Tomorrow-1000, Thurnell Lose, MD, 0.5 mL at 05/26/14 1000;  lactose free nutrition (BOOST PLUS) liquid 237 mL, 237 mL, Oral, BID BM, Stephanie La, RD, 237 mL at 05/29/14 1000;  menthol-cetylpyridinium (CEPACOL) lozenge 3 mg, 1 lozenge, Oral, PRN **OR** phenol (CHLORASEPTIC) mouth spray 1 spray, 1 spray, Mouth/Throat, PRN, Naiping  Eduard Roux, MD methocarbamol (ROBAXIN) tablet 500 mg, 500 mg, Oral, Q6H PRN, 500 mg at 05/29/14 0602 **OR** methocarbamol (ROBAXIN) 500 mg in dextrose 5 % 50 mL IVPB, 500 mg, Intravenous, Q6H PRN, Naiping Eduard Roux, MD;  metoCLOPramide (REGLAN) tablet 5-10 mg, 5-10 mg, Oral, Q8H PRN **OR** metoCLOPramide (REGLAN) injection 5-10 mg, 5-10 mg, Intravenous, Q8H PRN, Naiping Eduard Roux, MD metoprolol (LOPRESSOR) injection 5 mg, 5 mg, Intravenous, Q4H PRN, Thurnell Lose, MD;  morphine 2 MG/ML injection 0.5 mg, 0.5 mg, Intravenous, Q2H PRN, Naiping Eduard Roux, MD, 0.5 mg at 05/28/14 2233;  nicotine (NICODERM CQ - dosed in mg/24 hours) patch 21 mg, 21 mg, Transdermal, Daily, Jonetta Osgood, MD, 21 mg at 05/29/14 0948 ondansetron (ZOFRAN) tablet 4 mg, 4 mg, Oral, Q6H PRN **OR** ondansetron (ZOFRAN) injection 4 mg, 4 mg, Intravenous, Q6H PRN, Naiping Eduard Roux, MD;  oxyCODONE (Oxy IR/ROXICODONE) immediate release tablet 5-10 mg, 5-10 mg, Oral, Q4H PRN, Marianna Payment, MD, 10 mg at 05/29/14 0602;  phenytoin (DILANTIN) ER capsule 200 mg, 200 mg, Oral, BID, Jonetta Osgood, MD, 200 mg at 05/29/14 0941 polyethylene glycol (MIRALAX / GLYCOLAX) packet 17 g, 17 g, Oral, Daily PRN, Naiping Eduard Roux, MD;  senna [facility]) tablet 8.6 mg, 1 tablet, Oral, BID, Naiping Eduard Roux, MD, 8.6 mg at 05/29/14 0941;  sorbitol 70 % solution 30 mL, 30 mL, Oral, Daily PRN, Marianna Payment, MD;  tiotropium [facility]) inhalation capsule 18 mcg, 18 mcg, Inhalation, Daily, Thurnell Lose, MD, 18 mcg at 05/28/14 0849  Patients Current Diet: Diet regular  Precautions / Restrictions Precautions Precautions: Fall Restrictions Weight Bearing Restrictions: Yes RUE Weight Bearing: Non weight bearing RLE Weight Bearing: Weight bearing as tolerated   Prior Activity Level Community (5-7x/wk): 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 Pt enjoyed golf and softball until a few years ago.   Home Assistive Devices / Equipment Home Assistive Devices/Equipment: None  Prior Functional Level Prior Function Level of Independence: Independent Comments: Pt works for [employer]. Fall occurred at work  Current Functional Level Cognition  Overall Cognitive Status: Within Functional Limits for tasks assessed Orientation Level: Oriented X4    Extremity Assessment (includes Sensation/Coordination)          ADLs  Overall ADL's : Needs assistance/impaired Eating/Feeding: Set up, Sitting Eating/Feeding Details (indicate cue type and reason): Pt requires assistance to open containers and cut food.  Grooming: Set up, Sitting Upper Body Bathing: Minimal assitance, Sitting Upper Body Bathing Details (indicate cue type and reason): pt unable to maintain balance sitting on edge of chair without UE support and will require assistance Lower Body Bathing: Moderate assistance, Sit to/from stand Lower Body Bathing Details (indicate cue type and reason): Pt able to help with cleaning bottom in standing while maintaining balance Upper Body Dressing : Moderate assistance, Sitting Upper Body Dressing Details (indicate cue type and reason): (A) for balance  in sitting and to thread RUE Lower Body Dressing: Total assistance, Sit to/from stand Toilet Transfer: Moderate assistance, Stand-pivot (gait belt assisted transfer) Functional mobility during ADLs: Minimal assistance, Rolling walker, Cueing for sequencing, Cueing for safety General ADL Comments: Making steady progress. Able to ambulate @ room with platform rw during ADL. Cues for sequencing [place] eof RW    Mobility  Overal bed mobility: Needs Assistance Bed Mobility: Supine to Sit Supine to sit: +2 for physical assistance, Min assist Sit to supine: Min assist General bed mobility comments: min assist for upper body and second person to assist R LE    Transfers  Overall  transfer level: Needs assistance Equipment used: Right platform walker Transfers: Sit to/from Stand Sit to Stand: Min assist, +2 safety/equipment Stand pivot transfers: Min assist General transfer comment: min assist to rise mostly on left LE and for balance; second person for safety to assist with equipment    Ambulation / Gait / Stairs / Wheelchair Mobility  Ambulation/Gait Ambulation/Gait assistance: Mod assist, +2 safety/equipment Ambulation Distance (Feet): 10 Feet Assistive device: Right platform walker Gait Pattern/deviations: Step-to pattern Gait velocity: decreased  General Gait Details: Pt. unable to advance RW and needed mod assist to do so; moving R LE slightly better today.  Denies dizziness, fatigues early    Posture / Balance Dynamic Sitting Balance Sitting balance - Comments: requires use of LUE for support due to pain on R side of body    Special needs/care consideration BiPAP/CPAP no CPM no  Continuous Drip IV no  Dialysis no          Life Vest no  Oxygen no  Special Bed no  Trach Size no  Wound Vac (area) no      Skin - current R UE foreman cast and R hip incision from sx.                               Bowel mgmt: last BM on 05-28-14 Bladder mgmt: currently using urinal Diabetic mgmt no   Previous Home Environment Living Arrangements: Spouse/significant other Home Care Services: No Additional Comments: Pt's wife is very anxious in regards to d/c planning. She does not feel ready to take care of him and verbalizes fear of him falling. They would like rehab (CIR or SNF) prior to d/c home. Therapist in agreement   Discharge Living Setting Plans for Discharge Living Setting: Patient's home Type of Home at Discharge: House Discharge Home Layout: One level Discharge Home Access: Stairs to enter Entrance Stairs-Rails: None Entrance Stairs-Number of Steps: 3 steps to front porch; 2 steps to back sliding door Does the patient have any problems obtaining your  medications?: No  Social/Family/Support Systems Patient Roles: Spouse, Other (Comment) (works full time in heating & air. Pt is an involved grand-fa) Contact Information: wife [name] is primary contact Anticipated Caregiver: wife Anticipated Caregiver's Contact Information: see above Ability/Limitations of Caregiver: no limitations Caregiver Availability: 24/7 Discharge Plan Discussed with Primary Caregiver: Yes Is Caregiver In Agreement with Plan?: Yes Does Caregiver/Family have Issues with Lodging/Transportation while Pt is in Rehab?: No   Note: pt's wife is very supportive and a bit anxious about bringing him home.  Goals/Additional Needs Patient/Family Goal for Rehab: Supervision and Mod Indep. with PT; Supervision and mIn assist with OT; NA SLP Expected length of stay: 10-14 days Cultural Considerations: none Dietary Needs: regular diet Equipment Needs: to be determined Pt/Family Agrees to Admission and willing  to participate: Yes Program Orientation Provided & Reviewed with Pt/Caregiver Including Roles  & Responsibilities: Yes (met with pt and his wife on 11-6 and 11-9)   Decrease burden of Care through IP rehab admission: NA   Possible need for SNF placement upon discharge: not anticipated   Patient Condition: This patient's medical and functional status has changed since the consult dated: 05-26-14 in which the Rehabilitation Physician determined and documented that the patient's condition is appropriate for intensive rehabilitative care in an inpatient rehabilitation facility. See "History of Present Illness" (above) for medical update. Functional changes are: moderate assist of two to ambulate 10' and moderate assistance with self care skills. Patient's medical and functional status update has been discussed with the Rehabilitation physician and patient remains appropriate for inpatient rehabilitation. Will admit to inpatient rehab today.  Preadmission Screen Completed By:   Nanetta Batty, PT, 05/29/2014 10:55 AM ______________________________________________________________________   Discussed status with Dr. Naaman Plummer on 05-29-14 at 1055 and received telephone approval for admission today.  Admission Coordinator:  Nanetta Batty, PT, time1055/Date 05-29-14

## 2014-05-29 NOTE — Progress Notes (Signed)
Rehab admissions - I am following pt's case and received medical clearance from Dr. Sloan Leiter for inpatient rehab. Pt is to receive one unit PRBC's today and will need follow up CBC per Dr. Sloan Leiter. I received approval from worker's comp and we have a bed available today. We will admit today to inpatient rehab.  I met with pt and his wife to share this plan and they are both pleased he will be coming to inpatient rehab. Pt's wife had some anxiety about caring for pt and reassurance was provided. Admission paperwork completed with pt and his wife.  I updated pt's RN, [name] with case management and Barnett Applebaum, Education officer, museum. Please call me with any questions. Thanks.  Nanetta Batty, PT Rehabilitation Admissions Coordinator [phone]

## 2014-05-30 ENCOUNTER — Inpatient Hospital Stay (HOSPITAL_COMMUNITY): Payer: Commercial Managed Care - HMO | Admitting: Rehabilitation

## 2014-05-30 ENCOUNTER — Encounter (HOSPITAL_COMMUNITY): Payer: Commercial Managed Care - HMO | Admitting: Occupational Therapy

## 2014-05-30 DIAGNOSIS — F1010 Alcohol abuse, uncomplicated: Secondary | ICD-10-CM

## 2014-05-30 LAB — COMPREHENSIVE METABOLIC PANEL
ALBUMIN: 2.2 g/dL — AB (ref 3.5–5.2)
ALK PHOS: 76 U/L (ref 39–117)
ALT: 17 U/L (ref 0–53)
AST: 22 U/L (ref 0–37)
Anion gap: 9 (ref 5–15)
BILIRUBIN TOTAL: 0.3 mg/dL (ref 0.3–1.2)
BUN: 13 mg/dL (ref 6–23)
CHLORIDE: 101 meq/L (ref 96–112)
CO2: 26 mEq/L (ref 19–32)
Calcium: 8.2 mg/dL — ABNORMAL LOW (ref 8.4–10.5)
Creatinine, Ser: 0.68 mg/dL (ref 0.50–1.35)
GFR calc Af Amer: >90 mL/min (ref >90)
GFR calc non Af Amer: >90 mL/min (ref >90)
Glucose, Bld: 92 mg/dL (ref 70–99)
Potassium: 4.6 mEq/L (ref 3.7–5.3)
Sodium: 136 mEq/L — ABNORMAL LOW (ref 137–147)
Total Protein: 5.7 g/dL — ABNORMAL LOW (ref 6.0–8.3)

## 2014-05-30 LAB — TYPE AND SCREEN
ABO/RH(D): A POS
Antibody Screen: NEGATIVE
UNIT DIVISION: 0
Unit division: 0
Unit division: 0

## 2014-05-30 LAB — CBC WITH DIFFERENTIAL/PLATELET
BASOS ABS: 0 10*3/uL (ref 0.0–0.1)
Basophils Relative: 0 % (ref 0–1)
Eosinophils Absolute: 0.1 10*3/uL (ref 0.0–0.7)
Eosinophils Relative: 2 % (ref 0–5)
HCT: 28.3 % — ABNORMAL LOW (ref 39.0–52.0)
HEMOGLOBIN: 9.3 g/dL — AB (ref 13.0–17.0)
Lymphocytes Relative: 23 % (ref 12–46)
Lymphs Abs: 1.3 10*3/uL (ref 0.7–4.0)
MCH: 28.4 pg (ref 26.0–34.0)
MCHC: 32.9 g/dL (ref 30.0–36.0)
MCV: 86.3 fL (ref 78.0–100.0)
MONOS PCT: 15 % — AB (ref 3–12)
Monocytes Absolute: 0.9 10*3/uL (ref 0.1–1.0)
NEUTROS ABS: 3.4 10*3/uL (ref 1.7–7.7)
Neutrophils Relative %: 60 % (ref 43–77)
PLATELETS: 251 10*3/uL (ref 150–400)
RBC: 3.28 MIL/uL — ABNORMAL LOW (ref 4.22–5.81)
RDW: 16.3 % — ABNORMAL HIGH (ref 11.5–15.5)
WBC: 5.7 10*3/uL (ref 4.0–10.5)

## 2014-05-30 MED ORDER — BOOST PLUS PO LIQD
237.0000 mL | Freq: Two times a day (BID) | ORAL | Status: DC
  Administered 2014-05-31 – 2014-06-05 (×11): 237 mL via ORAL
  Filled 2014-05-30 (×17): qty 237

## 2014-05-30 NOTE — Progress Notes (Signed)
Social Work Assessment and Plan Social Work Assessment and Plan  Patient Details  Name: Joshua Reed MRN: [id number] Date of Birth: October 06, 1945  Today's Date: 05/30/2014  Problem List:  Patient Active Problem List   Diagnosis Date Noted  . Trauma 05/29/2014  . Distal radius fracture, right 05/29/2014  . Intertrochanteric fracture of right hip 05/29/2014  . Malnutrition of moderate degree 05/25/2014  . Femur fracture, right 05/24/2014  . Right wrist fracture 05/24/2014  . Hip fracture 05/24/2014  . Seizure disorder   . COPD (chronic obstructive pulmonary disease)   . Alcohol abuse   . Vitamin D deficiency   . Macular degeneration   . Fall from ladder   . Right hip pain    Past Medical History:  Past Medical History  Diagnosis Date  . Seizure disorder 2205    ALCOHOL RELATED  . Macular degeneration   . COPD (chronic obstructive pulmonary disease)   . Alcohol abuse   . Vitamin D deficiency    Past Surgical History:  Past Surgical History  Procedure Laterality Date  . Cardiac catheterization  2002    normal  . Open reduction internal fixation (orif) distal radial fracture Right 05/24/2014    Procedure: OPEN REDUCTION INTERNAL FIXATION (ORIF) DISTAL RADIAL FRACTURE;  Surgeon: Marianna Payment, MD;  Location: [facility];  Service: Orthopedics;  Laterality: Right;  . Intramedullary (im) nail intertrochanteric Right 05/24/2014    Procedure: INTRAMEDULLARY (IM) NAIL INTERTROCHANTRIC;  Surgeon: Marianna Payment, MD;  Location: [facility];  Service: Orthopedics;  Laterality: Right;   Social History:  reports that he quit smoking about 3 years ago. His smoking use included Cigarettes. He smoked 0.00 packs per day. He has never used smokeless tobacco. He reports that he drinks about 2.4 oz of alcohol per week. He reports that he does not use illicit drugs.  Family / Support Systems Marital Status: Married Patient Roles: Spouse, Parent, Other (Comment) (Employee) Spouse/Significant  Other: [name]  [phone] Children: [name]  [phone] Other Supports: Son Anticipated Caregiver: Wife Ability/Limitations of Caregiver: Wife is in good health but is anxious regarding pt's care and being able to provide it.  Enouraged her to observe pt in therapies Caregiver Availability: 24/7 Family Dynamics: Close knit family pt has always been independent and prided himself on doing for himself and being self sufficient.  He is having pain isues and is hopeful this will get better while here.  If he could manage his pain he would do alright and be home soon.  Social History Preferred language: English Religion:  Cultural Background: No issues Education: Trade School Read: Yes Write: Yes Employment Status: Employed Name of Employer: [employer] Return to Work Plans: Would like to return once healed from his fractures. Legal Hisotry/Current Legal Issues: Pt was injuried while working-so this is a Workers Educational psychologist: None-according to MD pt is capable [employer] making his own decisions while here.   Abuse/Neglect Physical Abuse: Denies Verbal Abuse: Denies Sexual Abuse: Denies Exploitation of patient/patient's resources: Denies Self-Neglect: Denies  Emotional Status Pt's affect, behavior adn adjustment status: Pt is motivated to improve and recover from this, but is having pain issues and is hoping this can be managed while here.  He is used to doing for himself and not relying upon others.  He doesn't want to burden his wife with his care. Recent Psychosocial Issues: Other medical issues that are managed Pyschiatric History: No history deferred depression screen due to pt feels he is doing ok with  all of this.  He feels he will not be here long due to moving pretty well already.  Will monitor and intervene if needed. Substance Abuse History: History ETOH, but still smokes, he realizes he needs to quit this.  He is aware of the resources out in the  community and will follow up if needed.  Patient / Family Perceptions, Expectations & Goals Pt/Family understanding of illness & functional limitations: Pt and wife have a good understanding of his fractures and WB issues.  She will come in and observe pt in therapies so she can see how he is doing and aak th questions she has which are causing her anxiety.  Pt realizes this healing will take time and patience is not one of his strong points. Premorbid pt/family roles/activities: Husband, father, grandfather, Glass blower/designer, Church member, etc Anticipated changes in roles/activities/participation: resume Pt/family expectations/goals: Pt states: " Once I get this pain better I will do alright."  Wife states: " I want him moving as well as he can before he leaves here."  [employer]: None Premorbid Home Care/DME Agencies: None Transportation available at discharge: Wife  Discharge Planning Living Arrangements: Spouse/significant other Support Systems: Spouse/significant other, Children, Other relatives, Water engineer, Social worker community Type of Residence: Private residence Insurance Resources: Multimedia programmer (specify) (Workers Tax adviser) Pensions consultant: Employment Museum/gallery curator Screen Referred: No Living Expenses: Lives with family Money Management: Spouse, Patient Does the patient have any problems obtaining your medications?: No Home Management: Wife Patient/Family Preliminary Plans: Return home with wife who can provide assist.  Encouraged her to attend therapies with pt to see him move and his progress.  Her main concern is that he not go home too soon.  Will work wiht both and workers comp CM to develop a Chartered loss adjuster. Social Work Anticipated Follow Up Needs: HH/OP Expected length of stay: 7-9 days  Clinical Impression Pleasant gentleman who has worked all of his life and wants to get back to work once he is healed.  His wife is supportive and willing to  assist him but is somewhat anxious about him coming too soon for her. She plans to attend therapies with pt and see the amount of care he requires.  Will work with she and pt and workers comp Tourist information centre manager on a safe discharge plan.  Elease Hashimoto 05/30/2014, 1:48 PM

## 2014-05-30 NOTE — Progress Notes (Signed)
Physical Therapy Session Note  Patient Details  Name: Joshua Reed MRN: [id number] Date of Birth: [birth year]  Today's Date: 05/30/2014 PT Individual Time: 1500-1610 PT Individual Time Calculation (min): 70 min   Short Term Goals: Week 1:  PT Short Term Goal 1 (Week 1): =LTG's due ELOS  Skilled Therapeutic Interventions/Progress Updates:   Pt received sitting in recliner in room.  Pt states increased pain/soreness from sitting for so long.  Encouraged him to call for the nurse or nurse tech if going to sit up in recliner in order to stand and move leg around to decrease stiffness.  Also discussed sitting on pillow to increase softness of seating surface.  Pt and wife verbalized understanding.  Discussed proper shoes to wear during session and wife to bring both slippers and shoes.  Skilled session focused on gait with R PFRW for quality and distance, stair training to simulate home entry, supine therex and bed mobility with use of leg lifter.  Performed 125' x 1 and another 77' x 1 with R PFRW at close S to min/guard level.  Min cues for relaxed shoulders (increased lat use), upright head/chest and increasing L step length.  Once in therapy gym, introduced leg lifter for bed mobility, moving RLE in bed and using for bed level exercises.  Pt requires mod A for controlled descent of trunk, however he was able to use leg lifter to assist RLE into/out of bed.  Cues for scooting prior to moving LE.  Performed supine therex; RLE SLR x 10 reps, RLE hip abd x 10 reps, RLE heel slides x 10 reps, and RLE knee presses/glute sets x 10 reps (all with use of leg lifter).  Pt assisted back to sitting.  Then negotiated up/down 2, 6" steps to simulate home entry with R PFRW.  Kept RW down on ground for both steps and assisted RW up to top step all with min A (assisted with RW on R side to prevent hand/wrist pain/precautions).  Ended session with gait back towards room as stated above.  Then assisted to w/c and back  to room.  Transferred back to bed with R PFRW at S level.  Assisted back to bed with mod A due to fatigue and pain.  Left in bed with all needs in reach and wife in room.    Therapy Documentation Precautions:  Precautions Precautions: Fall Restrictions Weight Bearing Restrictions: Yes RUE Weight Bearing: Non weight bearing RLE Weight Bearing: Weight bearing as tolerated Other Position/Activity Restrictions: Pt can weightbear through the right forearm.   Vital Signs: Therapy Vitals Temp: 98.5 F (36.9 C) Temp Source: Oral Pulse Rate: 75 Resp: 18 BP: (!) 105/53 mmHg Patient Position (if appropriate): Sitting Oxygen Therapy SpO2: 99 % O2 Device: Not Delivered Pain: Pain Assessment Pain Score: 3   See FIM for current functional status  Therapy/Group: Individual Therapy  Denice Bors 05/30/2014, 4:24 PM

## 2014-05-30 NOTE — Plan of Care (Signed)
Problem: RH BOWEL ELIMINATION Goal: RH STG MANAGE BOWEL WITH ASSISTANCE STG Manage Bowel with min Assistance.  Outcome: Progressing Goal: RH STG MANAGE BOWEL W/MEDICATION W/ASSISTANCE STG Manage Bowel with Medication with [place].  Outcome: Progressing Goal: RH STG MANAGE BOWEL W/EQUIPMENT W/ASSISTANCE STG Manage Bowel With Equipment With Min Assistance  Outcome: Not Applicable Date Met:  86/01/94  Problem: RH BLADDER ELIMINATION Goal: RH STG MANAGE BLADDER WITH ASSISTANCE STG Manage Bladder With supervision  Outcome: Progressing  Problem: RH SKIN INTEGRITY Goal: RH STG SKIN FREE OF INFECTION/BREAKDOWN Outcome: Progressing Goal: RH STG MAINTAIN SKIN INTEGRITY WITH ASSISTANCE STG Maintain Skin Integrity With [place].  Outcome: Progressing Goal: RH STG ABLE TO PERFORM INCISION/WOUND CARE W/ASSISTANCE STG Able To Perform Incision/Wound Care With [employer].  Outcome: Progressing  Problem: RH PAIN MANAGEMENT Goal: RH STG PAIN MANAGED AT OR BELOW PT'S PAIN GOAL Pain will be less than 3 on 1/10 scale  Outcome: Progressing

## 2014-05-30 NOTE — Progress Notes (Signed)
Subjective/Complaints: 68 y.o. male with history of COPD, alcohol abuse, macular degeneration, who was working on a house on 05/24/14 when he slipped and fell 6-8 feet off a ladder. He hit his right side on concrete with onset of right hip pain and right wrist deformity. Work up in ED revealed right distal radius fracture and right IT hip fracture. He was evaluated by Dr. Erlinda Hong and right wrist was manipulated and splinted in ED and he underwent IM nail of right hip fracture the same day. Post op WBAT on RLE and weight bearing through right elbow. PT/OT evaluations done yesterday and CIR recommended for follow up therapy. Follow up labs on 11/06 with  worsening of ABLA with drop in H/H to 6.5 and he was transfused with 2 units PRBC and is to receive another unit of PRBC today. Therapy ongoing and patient with improvement in orthostatic symptoms but continues to have problems with endurance   Slept ok last noc 3 BM since admission but hard stools  Review of Systems - Negative except constipation and right wrist pain  Objective: Vital Signs: Blood pressure 103/58, pulse 69, temperature 98.4 F (36.9 C), temperature source Oral, resp. rate 18, height '5\' 8"'  (1.727 m), weight 63.8 kg (140 lb 10.5 oz), SpO2 99 %. No results found. Results for orders placed or performed during the hospital encounter of 05/29/14 (from the past 72 hour(s))  Urinalysis, Routine w reflex microscopic     Status: Abnormal   Collection Time: 05/29/14  7:07 PM  Result Value Ref Range   Color, Urine YELLOW YELLOW   APPearance CLEAR CLEAR   Specific Gravity, Urine 1.023 1.005 - 1.030   pH 6.5 5.0 - 8.0   Glucose, UA NEGATIVE NEGATIVE mg/dL   Hgb urine dipstick NEGATIVE NEGATIVE   Bilirubin Urine NEGATIVE NEGATIVE   Ketones, ur 15 (A) NEGATIVE mg/dL   Protein, ur NEGATIVE NEGATIVE mg/dL   Urobilinogen, UA 0.2 0.0 - 1.0 mg/dL   Nitrite NEGATIVE NEGATIVE   Leukocytes, UA NEGATIVE NEGATIVE    Comment: MICROSCOPIC NOT DONE ON  URINES WITH NEGATIVE PROTEIN, BLOOD, LEUKOCYTES, NITRITE, OR GLUCOSE <1000 mg/dL.  CBC WITH DIFFERENTIAL     Status: Abnormal   Collection Time: 05/30/14  4:49 AM  Result Value Ref Range   WBC 5.7 4.0 - 10.5 K/uL   RBC 3.28 (L) 4.22 - 5.81 MIL/uL   Hemoglobin 9.3 (L) 13.0 - 17.0 g/dL   HCT 28.3 (L) 39.0 - 52.0 %   MCV 86.3 78.0 - 100.0 fL   MCH 28.4 26.0 - 34.0 pg   MCHC 32.9 30.0 - 36.0 g/dL   RDW 16.3 (H) 11.5 - 15.5 %   Platelets 251 150 - 400 K/uL   Neutrophils Relative % 60 43 - 77 %   Neutro Abs 3.4 1.7 - 7.7 K/uL   Lymphocytes Relative 23 12 - 46 %   Lymphs Abs 1.3 0.7 - 4.0 K/uL   Monocytes Relative 15 (H) 3 - 12 %   Monocytes Absolute 0.9 0.1 - 1.0 K/uL   Eosinophils Relative 2 0 - 5 %   Eosinophils Absolute 0.1 0.0 - 0.7 K/uL   Basophils Relative 0 0 - 1 %   Basophils Absolute 0.0 0.0 - 0.1 K/uL  Comprehensive metabolic panel     Status: Abnormal   Collection Time: 05/30/14  4:49 AM  Result Value Ref Range   Sodium 136 (L) 137 - 147 mEq/L   Potassium 4.6 3.7 - 5.3 mEq/L   Chloride  101 96 - 112 mEq/L   CO2 26 19 - 32 mEq/L   Glucose, Bld 92 70 - 99 mg/dL   BUN 13 6 - 23 mg/dL   Creatinine, Ser 0.68 0.50 - 1.35 mg/dL   Calcium 8.2 (L) 8.4 - 10.5 mg/dL   Total Protein 5.7 (L) 6.0 - 8.3 g/dL   Albumin 2.2 (L) 3.5 - 5.2 g/dL   AST 22 0 - 37 U/L   ALT 17 0 - 53 U/L   Alkaline Phosphatase 76 39 - 117 U/L   Total Bilirubin 0.3 0.3 - 1.2 mg/dL   GFR calc non Af Amer >90 >90 mL/min   GFR calc Af Amer >90 >90 mL/min    Comment: (NOTE) The eGFR has been calculated using the CKD EPI equation. This calculation has not been validated in all clinical situations. eGFR's persistently <90 mL/min signify possible Chronic Kidney Disease.    Anion gap 9 5 - 15     HEENT: normal and poor dentition Cardio: RRR and no murmur Resp: CTA B/L and unlabored GI: BS positive and NT.ND Extremity:  Pulses positive and No Edema Skin:   Intact and Wound C/D/I Neuro: Alert/Oriented,  Normal Sensory, Abnormal Motor 4/5 R delt, bi tri, 3- grip, pain inhibition, LUE 5/5 and Abnormal decreased FMC Musc/Skel:  Other pain with Active and passive ROM in RUE and RLE Gen NAD   Assessment/Plan: 1. Functional deficits secondary to Right distal radius fx, Right intertrochanteric fx after fall which require 3+ hours per day of interdisciplinary therapy in a comprehensive inpatient rehab setting. Physiatrist is providing close team supervision and 24 hour management of active medical problems listed below. Physiatrist and rehab team continue to assess barriers to discharge/monitor patient progress toward functional and medical goals. FIM:                   Comprehension Comprehension Mode: Auditory Comprehension: 6-Follows complex conversation/direction: With extra time/assistive device  Expression Expression Mode: Verbal Expression: 6-Expresses complex ideas: With extra time/assistive device  Social Interaction Social Interaction: 7-Interacts appropriately with others - No medications needed.  Problem Solving Problem Solving: 6-Solves complex problems: With extra time  Memory Memory: 6-More than reasonable amt of time  Medical Problem List and Plan: 1. Functional deficits secondary to Right distal radius fx, Right intertrochanteric fx after fall 2.  DVT Prophylaxis/Anticoagulation: Pharmaceutical: Lovenox 3. Pain Management: ON hydrocodone, oxycodone and IV morphine at current time. Will increase oxycodone to 15 mg prn and add low dose OxyContin for more consistent relief. . Continue robaxin prn for muscle spasms.    4. Mood: Team to provide ego support. LCSW to follow for evaluation and support.   5. Neuropsych: This patient is capable of making decisions on his own behalf. 6. Skin/Wound Care: Routine pressure relief measures. Monitor wound daily for healing. Elevation for edema control.   7. Fluids/Electrolytes/Nutrition:  Monitor I/O. Offer supplement as  indicated if intake poor.   8. H/o seizures (alcohol related): On dilantin and has been seizure free for a year. Recheck dilantin level due to decrease in dosage.   9. ABLA: Continue to monitor H/H with routine checks. Continue iron supplement 10. COPD: Encourage IS. Continue Spiriva.   11. Frequency: will check UA/UCS. Urinary retention resolved.   LOS (Days) 1 A FACE TO FACE EVALUATION WAS PERFORMED  Joshua Reed E 05/30/2014, 6:44 AM

## 2014-05-30 NOTE — Evaluation (Signed)
Occupational Therapy Assessment and Plan  Patient Details  Name: Joshua Reed MRN: [id number] Date of Birth: 11/30/1945  OT Diagnosis: acute pain, muscle weakness (generalized) and pain in joint Rehab Potential: Rehab Potential (ACUTE ONLY): Good ELOS: 7-9 days   Today's Date: 05/30/2014 OT Individual Time: 1001-1102 OT Individual Time Calculation (min): 61 min     Problem List:  Patient Active Problem List   Diagnosis Date Noted  . Trauma 05/29/2014  . Distal radius fracture, right 05/29/2014  . Intertrochanteric fracture of right hip 05/29/2014  . Malnutrition of moderate degree 05/25/2014  . Femur fracture, right 05/24/2014  . Right wrist fracture 05/24/2014  . Hip fracture 05/24/2014  . Seizure disorder   . COPD (chronic obstructive pulmonary disease)   . Alcohol abuse   . Vitamin D deficiency   . Macular degeneration   . Fall from ladder   . Right hip pain     Past Medical History:  Past Medical History  Diagnosis Date  . Seizure disorder 2205    ALCOHOL RELATED  . Macular degeneration   . COPD (chronic obstructive pulmonary disease)   . Alcohol abuse   . Vitamin D deficiency    Past Surgical History:  Past Surgical History  Procedure Laterality Date  . Cardiac catheterization  2002    normal  . Open reduction internal fixation (orif) distal radial fracture Right 05/24/2014    Procedure: OPEN REDUCTION INTERNAL FIXATION (ORIF) DISTAL RADIAL FRACTURE;  Surgeon: Marianna Payment, MD;  Location: [facility];  Service: Orthopedics;  Laterality: Right;  . Intramedullary (im) nail intertrochanteric Right 05/24/2014    Procedure: INTRAMEDULLARY (IM) NAIL INTERTROCHANTRIC;  Surgeon: Marianna Payment, MD;  Location: [facility];  Service: Orthopedics;  Laterality: Right;    Assessment & Plan Clinical Impression: Patient is a 68 y.o. year old male with recent admission to the hospital on 05/24/14 when he slipped and fell 6-8 feet off a ladder. He hit his right side on  concrete with onset of right hip pain and right wrist deformity. Work up in ED revealed right distal radius fracture and right IT hip fracture. He was evaluated by Dr. Erlinda Hong and right wrist was manipulated and splinted in ED and he underwent IM nail of right hip fracture the same day. Patient transferred to CIR on 05/29/2014 .    Patient currently requires mod with basic self-care skills secondary to muscle weakness and muscle joint tightness and decreased standing balance and decreased postural control.  Prior to hospitalization, patient could complete ADLs with independent .  Patient will benefit from skilled intervention to decrease level of assist with basic self-care skills and increase independence with basic self-care skills prior to discharge home with care partner.  Anticipate patient will require 24 hour supervision and follow up home health.  OT - End of Session Endurance Deficit: Yes OT Assessment Rehab Potential (ACUTE ONLY): Good OT Patient demonstrates impairments in the following area(s): Balance;Endurance;Pain;Safety OT Basic ADL's Functional Problem(s): Grooming;Bathing;Dressing;Toileting OT Transfers Functional Problem(s): Toilet;Tub/Shower OT Additional Impairment(s): Fuctional Use of Upper Extremity OT Plan OT Intensity: Minimum of 1-2 x/day, 45 to 90 minutes OT Frequency: 5 out of 7 days OT Duration/Estimated Length of Stay: 7-9 days OT Treatment/Interventions: Teacher, English as a foreign language;Discharge planning;Functional mobility training;Therapeutic Activities;Psychosocial support;UE/LE Coordination activities;Self Care/advanced ADL retraining;Neuromuscular re-education;Therapeutic Exercise;Pain management;Patient/family education OT Self Feeding Anticipated Outcome(s): modified independent level OT Basic Self-Care Anticipated Outcome(s): min assist level OT Toileting Anticipated Outcome(s): supervision level OT Bathroom Transfers Anticipated  Outcome(s): supervision level  OT Recommendation Patient destination: Home Follow Up Recommendations: Home health OT Equipment Recommended: 3 in 1 bedside comode;Tub/shower bench   OT Evaluation Precautions/Restrictions  Precautions Precautions: Fall Restrictions Weight Bearing Restrictions: Yes RUE Weight Bearing: Non weight bearing RLE Weight Bearing: Weight bearing as tolerated Other Position/Activity Restrictions: Pt can weightbear through the right forearm.  Pain Pain Assessment Pain Score: 3  Home Living/Prior Functioning Home Living Family/patient expects to be discharged to:: Private residence Living Arrangements: Spouse/significant other Available Help at Discharge: Family, Available 24 hours/day Type of Home: House Home Access: Stairs to enter Technical brewer of Steps: 2 Entrance Stairs-Rails: None Home Layout: One level  Lives With: Spouse IADL History Homemaking Responsibilities: No Current License: Yes Occupation: Full time employment Prior Function Level of Independence: Independent with basic ADLs, Independent with gait, Independent with transfers  Able to Take Stairs?: Yes Driving: Yes Vocation: Full time employment Comments: Pt works for [employer]. Fall occurred at work ADL  See FIM scale for details  Vision/Perception  Vision- History Baseline Vision/History: Wears glasses Wears Glasses: Reading only Patient Visual Report: No change from baseline  Cognition Overall Cognitive Status: Within Functional Limits for tasks assessed Arousal/Alertness: Awake/alert Orientation Level: Oriented X4 Attention: Alternating Alternating Attention: Appears intact Memory: Appears intact Awareness: Appears intact Problem Solving: Appears intact Safety/Judgment: Appears intact Comments: Pt did not demonstrate any unsafe behaviors during PT session.  Sensation Sensation Light Touch: Appears Intact Stereognosis: Impaired  Detail Stereognosis Impaired Details: Impaired RUE (Limited secondary to cast limitations) Hot/Cold: Appears Intact Proprioception: Appears Intact Coordination Gross Motor Movements are Fluid and Coordinated: Yes Fine Motor Movements are Fluid and Coordinated: No Coordination and Movement Description: Pt with decreased FM capabilities in the RUE secondary to short arm cast.  Limits thumb adduction and opposition. Motor  Motor Motor: Abnormal postural alignment and control Motor - Skilled Clinical Observations: Pt with decreased balance, decreased ability to sit and stand with equal WB due to increased pain, decreased mobility in RUE/LE Mobility  Bed Mobility Bed Mobility: Supine to Sit;Sit to Supine Supine to Sit: 4: Min assist Supine to Sit Details: Verbal cues for sequencing;Verbal cues for technique;Verbal cues for precautions/safety;Manual facilitation for placement Sit to Supine: 4: Min assist Sit to Supine - Details: Verbal cues for sequencing;Verbal cues for technique;Verbal cues for precautions/safety;Manual facilitation for placement Transfers Transfers: Sit to Stand Sit to Stand: 4: Min assist;With upper extremity assist;From chair/3-in-1 Sit to Stand Details: Verbal cues for sequencing;Verbal cues for technique;Verbal cues for precautions/safety;Manual facilitation for weight shifting Stand to Sit: 4: Min assist;With upper extremity assist;To chair/3-in-1 Stand to Sit Details (indicate cue type and reason): Verbal cues for sequencing;Verbal cues for technique;Verbal cues for precautions/safety;Manual facilitation for weight shifting Stand to Sit Details: Pt needs mod instructional cueing to use the LUE only to help control descend.  Trunk/Postural Assessment  Cervical Assessment Cervical Assessment: Within Functional Limits Thoracic Assessment Thoracic Assessment: Within Functional Limits Lumbar Assessment Lumbar Assessment: Exceptions to [facility] Lumbar Strength Overall  Lumbar Strength Comments: Pt with posterior pelvic tilt in sitting and standing, also keeps forward posture during standing secondary to increased pain Postural Control Postural Control: Deficits on evaluation Postural Limitations: Forward flexed posture during standing with tendency to lean to the L in sitting to prevent WB through R hip.  Encouraged him to do weight shifts in sitting.   Balance Balance Balance Assessed: Yes Static Sitting Balance Static Sitting - Balance Support: Feet supported;Left upper extremity supported Static Sitting - Level of Assistance: 6: Modified independent (Device/Increase  time) Dynamic Sitting Balance Dynamic Sitting - Balance Support: Feet supported;Left upper extremity supported Dynamic Sitting - Level of Assistance: 5: Stand by assistance Static Standing Balance Static Standing - Balance Support: During functional activity;Bilateral upper extremity supported Static Standing - Level of Assistance: 4: Min assist Dynamic Standing Balance Dynamic Standing - Balance Support: During functional activity;Bilateral upper extremity supported Dynamic Standing - Level of Assistance: 4: Min assist Extremity/Trunk Assessment RUE Assessment RUE Assessment: Exceptions to [facility] RUE Strength RUE Overall Strength Comments: AROM and strength WFLs for elbow and shoulder.  Pt currently in short arm cast secondary to wrist fx.  He demonstrates gross digit flexion and extension WFLS but is limited with oppositon to the 4th and 5th digits secondary to cast limitations. LUE Assessment LUE Assessment: Within Functional Limits  FIM:  FIM - Grooming Grooming Steps: Wash, rinse, dry face;Wash, rinse, dry hands Grooming: 4: Patient completes 3 of 4 or 4 of 5 steps FIM - Bathing Bathing Steps Patient Completed: Chest;Right Arm;Left Arm;Abdomen;Front perineal area;Buttocks;Right upper leg;Left upper leg;Left lower leg (including foot) Bathing: 4: Min-Patient completes 8-9 [id number]  parts or 75+ percent FIM - Upper Body Dressing/Undressing Upper body dressing/undressing: 0: Wears gown/pajamas-no public clothing FIM - Lower Body Dressing/Undressing Lower body dressing/undressing: 1: Total-Patient completed less than 25% of tasks (gripper socks only) FIM - TMusicianDevices: Grab bar or rail for support Toileting: 1: Two helpers   Refer to Care Plan for Long Term Goals  Recommendations for other services: None  Discharge Criteria: Patient will be discharged from OT if patient refuses treatment 3 consecutive times without medical reason, if treatment goals not met, if there is a change in medical status, if patient makes no progress towards goals or if patient is discharged from hospital.  The above assessment, treatment plan, treatment alternatives and goals were discussed and mutually agreed upon: by patient   Pt began education on selfcare re-training at the sink during session.  He needs mod instructional cueing for technique in order to comply with NWBing status in the RUE.  Increased pain noted in the right hip joint with all transitional movements as well requiring the pt to need min assist level.  Will likely benefit from AE for LB selfcare.  Jeptha Hinnenkamp,Ermias OTR/L 05/30/2014, 4:23 PM

## 2014-05-30 NOTE — Progress Notes (Signed)
Patient information reviewed and entered into eRehab system by Stanly Si, RN, CRRN, PPS Coordinator.  Information including medical coding and functional independence measure will be reviewed and updated through discharge.     Per nursing patient was given "Data Collection Information Summary for Patients in Inpatient Rehabilitation Facilities with attached "Privacy Act Statement-Health Care Records" upon admission.  

## 2014-05-30 NOTE — Evaluation (Signed)
Physical Therapy Assessment and Plan  Patient Details  Name: Joshua Reed MRN: [id number] Date of Birth: January 09, 1946  PT Diagnosis: Abnormality of gait, Difficulty walking, Muscle weakness and Pain in R wrist and R hip Rehab Potential: Excellent ELOS: 7-9 days    Today's Date: 05/30/2014 PT Individual Time: 8101-7510 PT Individual Time Calculation (min): 67 min    Problem List:  Patient Active Problem List   Diagnosis Date Noted  . Trauma 05/29/2014  . Distal radius fracture, right 05/29/2014  . Intertrochanteric fracture of right hip 05/29/2014  . Malnutrition of moderate degree 05/25/2014  . Femur fracture, right 05/24/2014  . Right wrist fracture 05/24/2014  . Hip fracture 05/24/2014  . Seizure disorder   . COPD (chronic obstructive pulmonary disease)   . Alcohol abuse   . Vitamin D deficiency   . Macular degeneration   . Fall from ladder   . Right hip pain     Past Medical History:  Past Medical History  Diagnosis Date  . Seizure disorder 2205    ALCOHOL RELATED  . Macular degeneration   . COPD (chronic obstructive pulmonary disease)   . Alcohol abuse   . Vitamin D deficiency    Past Surgical History:  Past Surgical History  Procedure Laterality Date  . Cardiac catheterization  2002    normal  . Open reduction internal fixation (orif) distal radial fracture Right 05/24/2014    Procedure: OPEN REDUCTION INTERNAL FIXATION (ORIF) DISTAL RADIAL FRACTURE;  Surgeon: Marianna Payment, MD;  Location: [facility];  Service: Orthopedics;  Laterality: Right;  . Intramedullary (im) nail intertrochanteric Right 05/24/2014    Procedure: INTRAMEDULLARY (IM) NAIL INTERTROCHANTRIC;  Surgeon: Marianna Payment, MD;  Location: [facility];  Service: Orthopedics;  Laterality: Right;    Assessment & Plan Clinical Impression: Patient is a 68 y.o. year old male with with history of COPD, alcohol abuse, macular degeneration, who was working on a house on 05/24/14 when he slipped and fell  6-8 feet off a ladder. He hit his right side on concrete with onset of right hip pain and right wrist deformity. Work up in ED revealed right distal radius fracture and right IT hip fracture. He was evaluated by Dr. Erlinda Hong and right wrist was manipulated and splinted in ED and he underwent IM nail of right hip fracture the same day. Post op WBAT on RLE and weight bearing through right elbow. PT/OT evaluations done yesterday and CIR recommended for follow up therapy.  Patient transferred to CIR on 05/29/2014 .   Patient currently requires min with mobility secondary to muscle weakness and decreased cardiorespiratoy endurance.  Prior to hospitalization, patient was independent  with mobility and lived with Spouse in a House home.  Home access is 2Stairs to enter.  Patient will benefit from skilled PT intervention to maximize safe functional mobility, minimize fall risk and decrease caregiver burden for planned discharge home with 24 hour supervision.  Anticipate patient will benefit from follow up [place] at discharge.  PT - End of Session Activity Tolerance: Tolerates 30+ min activity with multiple rests Endurance Deficit: Yes Endurance Deficit Description: Pt fatigued following gait, esp in ADL apt PT Assessment Rehab Potential (ACUTE/IP ONLY): Excellent PT Patient demonstrates impairments in the following area(s): Balance;Endurance;Motor;Pain;Safety PT Transfers Functional Problem(s): Bed Mobility;Bed to Chair;Car;Furniture PT Locomotion Functional Problem(s): Ambulation;Wheelchair Mobility;Stairs PT Plan PT Intensity: Minimum of 1-2 x/day ,45 to 90 minutes PT Frequency: 5 out of 7 days PT Duration Estimated Length of Stay: 7-9 days  PT Treatment/Interventions: Ambulation/gait training;Balance/vestibular training;Discharge planning;DME/adaptive equipment instruction;Functional mobility training;Pain management;Patient/family education;Stair training;Therapeutic Activities;Therapeutic Exercise;UE/LE  Strength taining/ROM;Wheelchair propulsion/positioning PT Transfers Anticipated Outcome(s): S overall PT Locomotion Anticipated Outcome(s): S overall with min A for stairs PT Recommendation Follow Up Recommendations: Home health PT;24 hour supervision/assistance Patient destination: Home Equipment Recommended: To be determined Equipment Details: R PFRW, TBD if needs w/c  Skilled Therapeutic Intervention PT assessment and evaluation performed, see full details below.  Educated and initiated gait training with R PFRW, and also introduced stair negotiation with pt with wife present.  Discussed home entry as well as single and double stairs to get into den, etc in house.  Wife states she will take a picture and bring in to continue to assess best plan.  Feel he can likely do stairs with RW, will continue to practice.  Discussed ELOS, equipment needs, rehab schedule, etc with pt and wife.  Wife nervous about D/C so soon, however discussed that he is doing very well and is already at min A level.  Both verbalize understanding.  Left in recliner in room with ice pack on R hip and R hand.    PT Evaluation Precautions/Restrictions Precautions Precautions: Fall Restrictions Weight Bearing Restrictions: Yes RUE Weight Bearing: Non weight bearing RLE Weight Bearing: Weight bearing as tolerated General Chart Reviewed: Yes Vital Signs Pain Pain Assessment Pain Assessment: 0-10 Pain Score: 8  Pain Type: Surgical pain Pain Location: Wrist Pain Orientation: Right Pain Descriptors / Indicators: Aching Pain Intervention(s): RN made aware Home Living/Prior Functioning Home Living Available Help at Discharge: Family;Available 24 hours/day Type of Home: House Home Access: Stairs to enter [employer] of Steps: 2 Entrance Stairs-Rails: None Home Layout: One level  Lives With: Spouse Prior Function Level of Independence: Independent with basic ADLs;Independent with gait;Independent with  transfers  Able to Take Stairs?: Yes Driving: Yes Vocation: Full time employment Comments: Pt works for [employer]. Fall occurred at work Vision/Perception   Wears glasses for reading, per chart, has macular degeneration.  Cognition Overall Cognitive Status: Within Functional Limits for tasks assessed Arousal/Alertness: Awake/alert Orientation Level: Oriented X4 Attention: Alternating Alternating Attention: Appears intact Memory: Appears intact Awareness: Appears intact Problem Solving: Appears intact Safety/Judgment: Appears intact Comments: Pt did not demonstrate any unsafe behaviors during PT session.  Sensation Sensation Light Touch: Appears Intact Stereognosis: Not tested Hot/Cold: Not tested Proprioception: Appears Intact Coordination Gross Motor Movements are Fluid and Coordinated: No Fine Motor Movements are Fluid and Coordinated: No Coordination and Movement Description: Pts movement and mobility very limited by pain in RUE/LE and also decreased ability to WB through RUE (hand and wrist) Heel Shin Test: unable to perform due to pain and decreased ROM.  Motor  Motor Motor: Other (comment) Motor - Skilled Clinical Observations: Pt with decreased balance, decreased ability to sit and stand with equal WB due to increased pain, decreased mobility in RUE/LE  Mobility Bed Mobility Bed Mobility: Supine to Sit;Sit to Supine Supine to Sit: 4: Min assist Supine to Sit Details: Verbal cues for sequencing;Verbal cues for technique;Verbal cues for precautions/safety;Manual facilitation for placement Sit to Supine: 4: Min assist Sit to Supine - Details: Verbal cues for sequencing;Verbal cues for technique;Verbal cues for precautions/safety;Manual facilitation for placement Sit to Supine - Details (indicate cue type and reason): Pt requires min A to elevate and lower RLE into and out of bed due to pain.  also cues for maintaining precautions in RUE during supine>sit.   Transfers Transfers: Yes Sit to Stand: 4: Min assist Sit  to Stand Details: Verbal cues for sequencing;Verbal cues for technique;Verbal cues for precautions/safety;Manual facilitation for weight shifting Stand to Sit: 4: Min assist Stand to Sit Details (indicate cue type and reason): Verbal cues for sequencing;Verbal cues for technique;Verbal cues for precautions/safety;Manual facilitation for weight shifting Stand Pivot Transfers: 4: Min assist Stand Pivot Transfer Details: Verbal cues for sequencing;Verbal cues for technique;Verbal cues for precautions/safety;Manual facilitation for weight shifting Stand Pivot Transfer Details (indicate cue type and reason): Pt requires continuous cues to attempt to sit and stand evenly to prevent LOB to the L.  Note that when he stabilizes R elbow on lower part of R side of walker (under platform) he is able to better control descent into chair.  Locomotion  Ambulation Ambulation: Yes Ambulation/Gait Assistance: 4: Min assist Ambulation Distance (Feet): 45 Feet Assistive device: Right platform walker Ambulation/Gait Assistance Details: Verbal cues for sequencing;Verbal cues for technique;Verbal cues for precautions/safety;Manual facilitation for weight shifting;Verbal cues for safe use of DME/AE;Verbal cues for gait pattern Ambulation/Gait Assistance Details: Requires assist for moving RW in ADL apt, but doing much better in controlled environment.  Cues for not stepping past RW, esp with RLE.  Gait Gait: Yes Gait Pattern: Impaired Gait Pattern: Step-to pattern;Decreased stance time - right;Decreased step length - left;Decreased hip/knee flexion - right;Poor foot clearance - right;Antalgic;Trunk flexed Stairs / Additional Locomotion Stairs: Yes Stairs Assistance: 4: Min assist Stairs Assistance Details: Verbal cues for sequencing;Verbal cues for technique;Verbal cues for precautions/safety;Manual facilitation for weight shifting;Verbal cues for safe use  of DME/AE;Verbal cues for gait pattern Stairs Assistance Details (indicate cue type and reason): Demonstration and verbal cues for sequencing and technique with RW.  Stair Management Technique: No rails;With walker;Step to pattern;Backwards Number of Stairs: 1 Height of Stairs: 3 Wheelchair Mobility Wheelchair Mobility: Yes Wheelchair Assistance: 4: Advertising account executive Details: Verbal cues for sequencing;Verbal cues for technique;Verbal cues for precautions/safety;Tactile cues for initiation;Manual facilitation for placement Wheelchair Propulsion: Left upper extremity;Left lower extremity Wheelchair Parts Management: Needs assistance Distance: 65  Trunk/Postural Assessment  Cervical Assessment Cervical Assessment: Within Functional Limits Thoracic Assessment Thoracic Assessment: Within Functional Limits Lumbar Assessment Lumbar Assessment: Exceptions to [facility] Lumbar Strength Overall Lumbar Strength Comments: Pt with posterior pelvic tilt in sitting and standing, also keeps forward posture during standing.  Postural Control Postural Control: Deficits on evaluation Postural Limitations: Forward flexed posture during standing with tendency to lean to the L in sitting to prevent WB through R hip.  Encouraged him to do weight shifts in sitting.   Balance Balance Balance Assessed: Yes Static Sitting Balance Static Sitting - Balance Support: Feet supported;Left upper extremity supported Static Sitting - Level of Assistance: 6: Modified independent (Device/Increase time) Dynamic Sitting Balance Dynamic Sitting - Balance Support: Feet supported;Left upper extremity supported Dynamic Sitting - Level of Assistance: 5: Stand by assistance Static Standing Balance Static Standing - Balance Support: During functional activity;Bilateral upper extremity supported Static Standing - Level of Assistance: 4: Min assist;5: Stand by assistance Dynamic Standing Balance Dynamic Standing -  Balance Support: During functional activity;Bilateral upper extremity supported Dynamic Standing - Level of Assistance: 4: Min assist Extremity Assessment      RLE Assessment RLE Assessment: Exceptions to [facility] RLE Strength RLE Overall Strength: Deficits;Due to pain RLE Overall Strength Comments: hip flex 2-/5, knee ext 2-/5, knee flex 2-/5, ankle motions 4/5 LLE Assessment LLE Assessment: Within Functional Limits  FIM:  FIM - Bed/Chair Transfer Bed/Chair Transfer Assistive Devices: Adult nurse Transfer: 4: Supine > Sit: Min A (steadying Pt. >  75%/lift 1 leg);4: Sit > Supine: Min A (steadying pt. > 75%/lift 1 leg);4: Bed > Chair or W/C: Min A (steadying Pt. > 75%);4: Chair or W/C > Bed: Min A (steadying Pt. > 75%) FIM - Locomotion: Wheelchair Distance: 65 Locomotion: Wheelchair: 2: Travels 50 - 149 ft with minimal assistance (Pt.>75%) FIM - Locomotion: Ambulation Locomotion: Ambulation Assistive Devices: Engineer, agricultural Ambulation/Gait Assistance: 4: Min assist Locomotion: Ambulation: 1: Travels less than 50 ft with minimal assistance (Pt.>75%) FIM - Locomotion: Stairs Locomotion: Scientist, physiological: Publishing copy: Stairs: 1: Up and Down < 4 stairs with minimal assistance (Pt.>75%)   Refer to Care Plan for Long Term Goals  Recommendations for other services: None  Discharge Criteria: Patient will be discharged from PT if patient refuses treatment 3 consecutive times without medical reason, if treatment goals not met, if there is a change in medical status, if patient makes no progress towards goals or if patient is discharged from hospital.  The above assessment, treatment plan, treatment alternatives and goals were discussed and mutually agreed upon: by patient and by family  Denice Bors 05/30/2014, 10:53 AM

## 2014-05-30 NOTE — Progress Notes (Signed)
INITIAL NUTRITION ASSESSMENT  Pt meets criteria for NON-SEVERE (MODERATE) MALNUTRITION in the context of chronic illness as evidenced by moderate fat and moderate to severe muscle mass loss.  DOCUMENTATION CODES Per approved criteria  -Non-severe (moderate) malnutrition in the context of chronic illness   INTERVENTION: Provide Boost Plus po BID, each supplement provides 360 kcal and 14 grams of protein.  Encourage adequate PO intake.  NUTRITION DIAGNOSIS: Increased nutrient needs related to trauma as evidenced by estimated nutrition needs.   Goal: Pt to meet >/= 90% of their estimated nutrition needs   Monitor:  PO intake, weight trends, labs, I/O's  Reason for Assessment: MST  68 y.o. male  Admitting Dx: Trauma  ASSESSMENT: Pt with history of COPD, alcohol abuse, macular degeneration, who was working on a house on 05/24/14 when he slipped and fell 6-8 feet off a ladder. He hit his right side on concrete with onset of right hip pain and right wrist deformity. Work up in ED revealed right distal radius fracture and right IT hip fracture.  Pt was seen by this RD during his acute hospitalization. Pt reports having a good appetite. Meal completion is 100%. Pt reports no previous difficulties with his appetite. Pt reports his usual body weight of around 127 lbs. Pt usually drinks Boost at home and would like it ordered for him. Will order.   Nutrition Focused Physical Exam:  Subcutaneous Fat:  Orbital Region: N/A Upper Arm Region: Moderate depletion Thoracic and Lumbar Region: Moderate depletion  Muscle:  Temple Region: N/A Clavicle Bone Region: Severe depletion Clavicle and Acromion Bone Region: Severe depletion Scapular Bone Region: N/A Dorsal Hand: N/A Patellar Region: WNL Anterior Thigh Region: Moderate depletion Posterior Calf Region: WNL  Edema: none  Labs: Low sodium and calcium.  Height: Ht Readings from Last 1 Encounters:  05/29/14 5\' 8"  (1.727 m)     Weight: Wt Readings from Last 1 Encounters:  05/29/14 140 lb 10.5 oz (63.8 kg)    Ideal Body Weight: 154 lbs  % Ideal Body Weight: 91%  Wt Readings from Last 10 Encounters:  05/29/14 140 lb 10.5 oz (63.8 kg)  05/29/14 144 lb 6.4 oz (65.5 kg)    Usual Body Weight: 127 lbs  % Usual Body Weight: 110%  BMI:  Body mass index is 21.39 kg/(m^2).  Estimated Nutritional Needs: Kcal: 1900-2100 Protein: 80-95 grams Fluid: 1.9 - 2.1 L/day  Skin: incision on hip and right arm  Diet Order: Diet regular  EDUCATION NEEDS: -No education needs identified at this time   Intake/Output Summary (Last 24 hours) at 05/30/14 1355 Last data filed at 05/30/14 0800  Gross per 24 hour  Intake    360 ml  Output   1675 ml  Net  -1315 ml    Last BM: 11/9  Labs:   Recent Labs Lab 05/25/14 0025 05/28/14 0515 05/30/14 0449  NA 135* 135* 136*  K 4.7 4.0 4.6  CL 99 99 101  CO2 26 25 26   BUN 10 10 13   CREATININE 0.67 0.65 0.68  CALCIUM 7.7* 7.8* 8.2*  GLUCOSE 180* 110* 92    CBG (last 3)  No results for input(s): GLUCAP in the last 72 hours.  Scheduled Meds: . cholecalciferol  1,000 Units Oral Daily  . enoxaparin (LOVENOX) injection  40 mg Subcutaneous Q24H  . famotidine  10 mg Oral QHS  . ferrous sulfate  325 mg Oral BID WC  . nicotine  21 mg Transdermal Daily  . OxyCODONE  10 mg  Oral Q12H  . phenytoin  200 mg Oral BID  . senna-docusate  1 tablet Oral BID  . tiotropium  18 mcg Inhalation Daily    Continuous Infusions:   Past Medical History  Diagnosis Date  . Seizure disorder 2205    ALCOHOL RELATED  . Macular degeneration   . COPD (chronic obstructive pulmonary disease)   . Alcohol abuse   . Vitamin D deficiency     Past Surgical History  Procedure Laterality Date  . Cardiac catheterization  2002    normal  . Open reduction internal fixation (orif) distal radial fracture Right 05/24/2014    Procedure: OPEN REDUCTION INTERNAL FIXATION (ORIF) DISTAL RADIAL  FRACTURE;  Surgeon: Marianna Payment, MD;  Location: [place];  Service: Orthopedics;  Laterality: Right;  . Intramedullary (im) nail intertrochanteric Right 05/24/2014    Procedure: INTRAMEDULLARY (IM) NAIL INTERTROCHANTRIC;  Surgeon: Marianna Payment, MD;  Location: [place];  Service: Orthopedics;  Laterality: Right;    Kallie Locks, MS, RD, LDN Pager # [phone] After hours/ weekend pager # [phone]

## 2014-05-30 NOTE — Care Management Note (Signed)
[facility] Individual Statement of Services  Patient Name:  Joshua Reed  Date:  05/30/2014  Welcome to the [facility].  Our goal is to provide you with an individualized program based on your diagnosis and situation, designed to meet your specific needs.  With this comprehensive rehabilitation program, you will be expected to participate in at least 3 hours of rehabilitation therapies Monday-Friday, with modified therapy programming on the weekends.  Your rehabilitation program will include the following services:  Physical Therapy (PT), Occupational Therapy (OT), 24 hour per day rehabilitation nursing, Case Management (Social Worker), Rehabilitation Medicine, Nutrition Services and Pharmacy Services  Weekly team conferences will be held on Wednesday to discuss your progress.  Your Social Worker will talk with you frequently to get your input and to update you on team discussions.  Team conferences with you and your family in attendance may also be held.  Expected length of stay: 7-9 days Overall anticipated outcome: supervision/min level  Depending on your progress and recovery, your program may change. Your Social Worker will coordinate services and will keep you informed of any changes. Your Social Worker's name and contact numbers are listed  below.  The following services may also be recommended but are not provided by the [facility] will be made to provide these services after discharge if needed.  Arrangements include referral to agencies that provide these services.  Your insurance has been verified to be:  Workers Comp Your primary doctor is:  Lennette Bihari [facility]  Pertinent information will be shared with your doctor and your insurance company.  Social Worker:  Ovidio Kin, [place] or ([phone]  Information discussed with and copy given to patient by: Elease Hashimoto, 05/30/2014, 11:25 AM

## 2014-05-31 ENCOUNTER — Inpatient Hospital Stay (HOSPITAL_COMMUNITY): Payer: Commercial Managed Care - HMO | Admitting: Rehabilitation

## 2014-05-31 ENCOUNTER — Encounter (HOSPITAL_COMMUNITY): Payer: Commercial Managed Care - HMO | Admitting: Occupational Therapy

## 2014-05-31 ENCOUNTER — Inpatient Hospital Stay (HOSPITAL_COMMUNITY): Payer: Commercial Managed Care - HMO | Admitting: Occupational Therapy

## 2014-05-31 ENCOUNTER — Inpatient Hospital Stay (HOSPITAL_COMMUNITY): Payer: Commercial Managed Care - HMO | Admitting: *Deleted

## 2014-05-31 LAB — URINE CULTURE
COLONY COUNT: NO GROWTH
Culture: NO GROWTH

## 2014-05-31 MED ORDER — BENEPROTEIN PO POWD
6.0000 g | Freq: Three times a day (TID) | ORAL | Status: DC
  Administered 2014-05-31 – 2014-06-06 (×19): 6 g via ORAL
  Filled 2014-05-31 (×4): qty 6
  Filled 2014-05-31: qty 227

## 2014-05-31 NOTE — Progress Notes (Signed)
Occupational Therapy Session Note  Patient Details  Name: Joshua Reed MRN: [id number] Date of Birth: 1946-07-04  Today's Date: 05/31/2014 OT Individual Time: 0650-0750 and 1300-1400 OT Individual Time Calculation (min): 60 min  And 60 min   Short Term Goals: Week 1:  OT Short Term Goal 1 (Week 1): STGs = to LTGs set at supervision to min assist based on LOS.  Skilled Therapeutic Interventions/Progress Updates:  Session 1: Upon entering the room, pt supine in bed with 10/10 c/o pain in R wrist. OT had pt call RN for pain medication that was distributed to patient during session. OT educating pt on pain management strategies during session. Pt verbalizing understanding but education to continue. Pt refusing to wear clothing this session and instead requesting to wear hospital gown. OT educated patient on OT POC, goals, and purpose. Pt performed bathing seated in wheelchair at sink side. Pt requiring Min A for bathing and steady assist with dynamic standing balance during bathing tasks. Pt seated in wheelchair with breakfast tray upon entering the room. Pt with call bell and all needed items within reach.  Session 2: Upon entering the room, pt supine in bed with 5/10 c/o pain in R wrist and reports, "They just gave me my pain medicine not long ago." Pt performing supine >sit with supervision and use of leg lifter for R LE. Pt ambulating with platform RW to tub room ~ 150 feet and back with steady assist to close supervision and increased time. OT educated and demonstrated transfer onto TTB with pt returning demonstration with steady assist, use of leg lifter, and verbal cues for proper technique. OT also discussing fall concerns within bathroom such as scatter rugs and wet surfaces. OT recommended 1 non slip bath mat outside of tub and purchase of steady strips for tub surface. Pt ambulated back to room. Toileting with use of BSC with steady assist for transfers and completed all 3 steps with  steady assist only. Pt supine in bed with visitors present upon exiting the room. Ice packs applied to R wrist secondary to pain and edema.   Therapy Documentation Precautions:  Precautions Precautions: Fall Restrictions Weight Bearing Restrictions: Yes RUE Weight Bearing: Non weight bearing RLE Weight Bearing: Weight bearing as tolerated Other Position/Activity Restrictions: Pt can weightbear through the right forearm.  See FIM for current functional status  Therapy/Group: Individual Therapy  Phineas Semen 05/31/2014, 11:24 AM

## 2014-05-31 NOTE — Progress Notes (Signed)
Physical Therapy Session Note  Patient Details  Name: Joshua Reed MRN: [id number] Date of Birth: 01-Apr-1946  Today's Date: 05/31/2014 PT Individual Time: 0900-1000 PT Individual Time Calculation (min): 60 min   Short Term Goals: Week 1:  PT Short Term Goal 1 (Week 1): =LTG's due ELOS  Skilled Therapeutic Interventions/Progress Updates:   Pt received sitting in w/c in room, agreeable to therapy.  Discussed with pt putting on his clothes in order for therapist to better see what his LEs are doing during gait.  Pt agreeable, therefore assisted with donning pants and shirt prior to leaving room.  Note pt in bedroom shoes, but seem to have good rubber sole to prevent forward slipping.  Performed sit>stand to RW at min/guard and able to pull up pants at close S level.  Performed gait to therapy gym x 125' with R PFRW at close S level with cues for increased R hip/knee flex and more heel strike on RLE.  Performed seated nustep x 4 mins at level 2 resistance with LEs only to increase strength, but more so to work on increasing ROM in RLE.  Increased pain to get RLE to pedal, but did very well once starting to pedal.  Progressed to continued stair negotiation with R PFRW x 2, 6" steps at min A (will need min A to assist with R side of RW due to precautions).  Also cues for placing whole foot onto step.  Ended session with car transfer, assisted to ortho gym via w/c.  Performed short distance gait to home simulated height car at close S level.  Requires min/mod A for elevating RLE into and out of car.  This will continue to be pts biggest limitation (car transfer and stairs).  Discussed having wife and daughter coming to therapy Friday for family training and hands on, esp for stairs.  Pt states that daughter will be better to assist him with this due to wife's anxiety.  PT verbalized understanding.  Pt ambulated back to w/c and assisted back to room.  Pt left in w/c for increased comfort with cues to get up  if going to sit for longer period of time to call for nurse, tech to assist with standing.  Pt verbalized understanding.    Therapy Documentation Precautions:  Precautions Precautions: Fall Restrictions Weight Bearing Restrictions: Yes RUE Weight Bearing: Non weight bearing RLE Weight Bearing: Weight bearing as tolerated Other Position/Activity Restrictions: Pt can weightbear through the right forearm.   Vital Signs: Oxygen Therapy SpO2: 98 % O2 Device: Not Delivered Pain: Pain Assessment Pain Assessment: 0-10 Pain Score: 10-Worst pain ever Pain Type: Surgical pain Pain Location: Arm Pain Orientation: Right Pain Descriptors / Indicators: Aching Pain Frequency: Intermittent Pain Onset: On-going Patients Stated Pain Goal: 2 Pain Intervention(s): Medication (See eMAR) Multiple Pain Sites: No  See FIM for current functional status  Therapy/Group: Individual Therapy  Denice Bors 05/31/2014, 9:24 AM

## 2014-05-31 NOTE — Progress Notes (Signed)
Social Work Patient ID: Joshua Reed, male   DOB: Aug 02, 1945, 68 y.o.   MRN: [id number] Met with pt and wife who was here to discuss team conference goals-supervision/min level goals and discharge 11/17.  Wife reports she will need to move some of their Furniture before he comes home, they have too much and it is in the way.  Asked her to come in and attend therapies with pt to begin family education.  Pt calmed wife down and Feels if she see's him in therapies she will be less anxious.  Aware working with Therapist, occupational regarding discharge needs and follow up.  Continue to work on [employer].

## 2014-05-31 NOTE — Patient Care Conference (Signed)
Inpatient RehabilitationTeam Conference and Plan of Care Update Date: 05/31/2014   Time: 11;20 AM    Patient Name: Joshua Reed      Medical Record Number: [id number]  Date of Birth: [birth year] Sex: Male         Room/Bed: 4M01C/4M01C-01 Payor Info: Payor: [employer] MEDICARE / Plan: [place] HMO / Product Type: *No Product type* /    Admitting Diagnosis: R hip  wrist fx  Admit Date/Time:  05/29/2014  4:13 PM Admission Comments: No comment available   Primary Diagnosis:  <principal problem not specified> Principal Problem: <principal problem not specified>  Patient Active Problem List   Diagnosis Date Noted  . Trauma 05/29/2014  . Distal radius fracture, right 05/29/2014  . Intertrochanteric fracture of right hip 05/29/2014  . Malnutrition of moderate degree 05/25/2014  . Femur fracture, right 05/24/2014  . Right wrist fracture 05/24/2014  . Hip fracture 05/24/2014  . Seizure disorder   . COPD (chronic obstructive pulmonary disease)   . Alcohol abuse   . Vitamin D deficiency   . Macular degeneration   . Fall from ladder   . Right hip pain     Expected Discharge Date: Expected Discharge Date: 06/06/14  Team Members Present: Physician leading conference: Dr. Alysia Penna Social Worker Present: Ovidio Kin, LCSW Nurse Present: Elliot Cousin, RN PT Present: Cameron Sprang, PT;Blayke Pinera Jari Favre, PT OT Present: Benay Pillow, Maryella Shivers, OT SLP Present: Gunnar Fusi, SLP PPS Coordinator present : Daiva Nakayama, RN, CRRN     Current Status/Progress Goal Weekly Team Focus  Medical   Pain control adequate, right wrist more painful then right hip  Maintain medical stability during rehabilitation stay  Adjust pain medications as needed   Bowel/Bladder   Continent to bowel and bladder.  To continue continent and bladder.  To monitore bladder and bowel function Q shift.   Swallow/Nutrition/ Hydration     WFL        ADL's   supervision for UB selfcare, mod  assist for LB bathing and dressing, min assist for toilet transfers  supervision overall with min assist for LB dressing and modified indepedence for grooming   selfcare re-training, transfer training, balance re-training, DME/AE education   Mobility   mod A for bed mobility (assist for LEs into and out of bed or trunk if he manages LEs), min A for sit<>stands, transfers and gait with R PFRW, min A for stairs  S overall, min A stairs  activity tolerance, RLE strengthening, gait, stairs, pt/family education   Communication     NA        Safety/Cognition/ Behavioral Observations    No unsafe behaviors        Pain   Pain is less with Oxicontin schedule and Oxi IR PRN.  To keep pain level less than 3 on scale 1 to 10. To premedicated pt. before therapy if necesary.  To monitore pain leve Q 2 hrs. and check pain levels with activity.   Skin   Rt. radious incision with post surgical dressing,Rt. hip incision with mepilex on.  To keep skin integrity intact,keep incision clean and dry.  To monitore skin and incisions Q shift.      *See Care Plan and progress notes for long and short-term goals.  Barriers to Discharge: History of alcohol abuse, question compliance follow-through    Possible Resolutions to Barriers:  Continue rehabilitation, identified post op caregivers    Discharge Planning/Teaching Needs:  Home with wife who is anxious regarding how  much care pt will require, encouraged her to attend therapies with pt      Team Discussion:  Pain in his wrist limits his progress in therapies. Pain is better in his leg today. Goals  Set at supervision/min level .  Workers Comp involved and will assist with discharge plans.  Wife needs to attend therapies with pt due to very anxious about coming home too soon.  Revisions to Treatment Plan:  None   Continued Need for Acute Rehabilitation Level of Care: The patient requires daily medical management by a physician with specialized training in  physical medicine and rehabilitation for the following conditions: Daily direction of a multidisciplinary physical rehabilitation program to ensure safe treatment while eliciting the highest outcome that is of practical value to the patient.: Yes Daily medical management of patient stability for increased activity during participation in an intensive rehabilitation regime.: Yes Daily analysis of laboratory values and/or radiology reports with any subsequent need for medication adjustment of medical intervention for : Post surgical problems;Other  Jayme Cham, Gardiner Rhyme 05/31/2014, 2:26 PM

## 2014-05-31 NOTE — IPOC Note (Signed)
Overall Plan of Care [facility]) Patient Details Name: Joshua Reed MRN: [id number] DOB: 01-28-1946  Admitting Diagnosis: R hip  wrist fx  Hospital Problems: Active Problems:   Alcohol abuse   Fall from ladder   Trauma   Distal radius fracture, right   Intertrochanteric fracture of right hip     Functional Problem List: Nursing Bladder, Bowel, Pain, Safety, Skin Integrity  PT Balance, Endurance, Motor, Pain, Safety  OT Balance, Endurance, Pain, Safety  SLP    TR         Basic ADL's: OT Grooming, Bathing, Dressing, Toileting     Advanced  ADL's: OT       Transfers: PT Bed Mobility, Bed to Chair, Car, Manufacturing systems engineer, Metallurgist: PT Ambulation, Emergency planning/management officer, Stairs     Additional Impairments: OT Fuctional Use of Upper Extremity  SLP        TR      Anticipated Outcomes Item Anticipated Outcome  Self Feeding modified independent level  Swallowing      Basic self-care  min assist level  Toileting  supervision level   Bathroom Transfers supervision level  Bowel/Bladder  Continent to bowel and bladder.  Transfers  S overall  Locomotion  S overall with min A for stairs  Communication     Cognition     Pain  Pain level less than 3 on scale 1 to 10.  Safety/Judgment      Therapy Plan: PT Intensity: Minimum of 1-2 x/day ,45 to 90 minutes PT Frequency: 5 out of 7 days PT Duration Estimated Length of Stay: 7-9 days  OT Intensity: Minimum of 1-2 x/day, 45 to 90 minutes OT Frequency: 5 out of 7 days OT Duration/Estimated Length of Stay: 7-9 days         Team Interventions: Nursing Interventions Pain Management, Skin Care/Wound Management, Patient/Family Education  PT interventions Ambulation/gait training, Training and development officer, Discharge planning, DME/adaptive equipment instruction, Functional mobility training, Pain management, Patient/family education, Stair training, Therapeutic Activities, Therapeutic Exercise,  UE/LE Strength taining/ROM, Wheelchair propulsion/positioning  OT Interventions Training and development officer, DME/adaptive equipment instruction, Discharge planning, Functional mobility training, Therapeutic Activities, Psychosocial support, UE/LE Coordination activities, Self Care/advanced ADL retraining, Neuromuscular re-education, Therapeutic Exercise, Pain management, Patient/family education  SLP Interventions    TR Interventions    SW/CM Interventions Discharge Planning, Psychosocial Support, Patient/Family Education    Team Discharge Planning: Destination: PT-Home ,OT- Home , SLP-  Projected Follow-up: PT-Home health PT, 24 hour supervision/assistance, OT-  Home health OT, SLP-  Projected Equipment Needs: PT-To be determined, OT- 3 in 1 bedside comode, Tub/shower bench, SLP-  Equipment Details: PT-R PFRW, TBD if needs w/c, OT-  Patient/family involved in discharge planning: PT- Patient, Family member/caregiver,  OT-Patient, SLP-   MD ELOS: 10-14d Medical Rehab Prognosis:  Good Assessment: 68 y.o. male with history of COPD, alcohol abuse, macular degeneration, who was working on a house on 05/24/14 when he slipped and fell 6-8 feet off a ladder. He hit his right side on concrete with onset of right hip pain and right wrist deformity. Work up in ED revealed right distal radius fracture and right IT hip fracture. He was evaluated by Dr. Erlinda Hong and right wrist was manipulated and splinted in ED and he underwent IM nail of right hip fracture the same day. Post op WBAT on RLE and weight bearing through right elbow. PT/OT evaluations done yesterday and CIR recommended for follow up therapy  Now requiring 24/7 Rehab RN,MD, as well  as CIR level PT, OT and SLP.  Treatment team will focus on ADLs and mobility with goals set at [place]   See Team Conference Notes for weekly updates to the plan of care

## 2014-05-31 NOTE — Progress Notes (Signed)
Recreational Therapy Session Note  Patient Details  Name: PERETZ THIEME MRN: [id number] Date of Birth: 24-Dec-1945 Today's Date: 05/31/2014  Pain: c/o pain, unrated, premedicated Skilled Therapeutic Interventions/Progress Updates: Met with pt to discuss TR services.  No formal TR treatment plan implemented at this time.  Will continue to monitor for community reintegration as appropriate.  Therapy/Group: Individual Therapy   Reynald Woods 05/31/2014, 5:38 PM

## 2014-05-31 NOTE — Progress Notes (Addendum)
Subjective/Complaints: 68 y.o. male with history of COPD, alcohol abuse, macular degeneration, who was working on a house on 05/24/14 when he slipped and fell 6-8 feet off a ladder. He hit his right side on concrete with onset of right hip pain and right wrist deformity. Work up in ED revealed right distal radius fracture and right IT hip fracture. He was evaluated by Dr. Erlinda Hong and right wrist was manipulated and splinted in ED and he underwent IM nail of right hip fracture the same day. Post op WBAT on RLE and weight bearing through right elbow. PT/OT evaluations done yesterday and CIR recommended for follow up therapy. Follow up labs on 11/06 with  worsening of ABLA with drop in H/H to 6.5 and he was transfused with 2 units PRBC and is to receive another unit of PRBC today. Therapy ongoing and patient with improvement in orthostatic symptoms but continues to have problems with endurance   "I've been walking with walker" Review of Systems - Negative except constipation and right wrist pain  Objective: Vital Signs: Blood pressure 108/56, pulse 76, temperature 98.5 F (36.9 C), temperature source Oral, resp. rate 18, height _0  (1.727 m), weight 61.3 kg (135 lb 2.3 oz), SpO2 99 %. No results found. Results for orders placed or performed during the hospital encounter of 05/29/14 (from the past 72 hour(s))  Urinalysis, Routine w reflex microscopic     Status: Abnormal   Collection Time: 05/29/14  7:07 PM  Result Value Ref Range   Color, Urine YELLOW YELLOW   APPearance CLEAR CLEAR   Specific Gravity, Urine 1.023 1.005 - 1.030   pH 6.5 5.0 - 8.0   Glucose, UA NEGATIVE NEGATIVE mg/dL   Hgb urine dipstick NEGATIVE NEGATIVE   Bilirubin Urine NEGATIVE NEGATIVE   Ketones, ur 15 (A) NEGATIVE mg/dL   Protein, ur NEGATIVE NEGATIVE mg/dL   Urobilinogen, UA 0.2 0.0 - 1.0 mg/dL   Nitrite NEGATIVE NEGATIVE   Leukocytes, UA NEGATIVE NEGATIVE    Comment: MICROSCOPIC NOT DONE ON URINES WITH NEGATIVE  PROTEIN, BLOOD, LEUKOCYTES, NITRITE, OR GLUCOSE <1000 mg/dL.  Urine culture     Status: None   Collection Time: 05/29/14  7:08 PM  Result Value Ref Range   Specimen Description URINE, CLEAN CATCH    Special Requests NONE    Culture  Setup Time      05/30/2014 02:34 Performed at [place] Performed at [facility]     Culture NO GROWTH Performed at [facility]     Report Status 05/31/2014 FINAL   CBC WITH DIFFERENTIAL     Status: Abnormal   Collection Time: 05/30/14  4:49 AM  Result Value Ref Range   WBC 5.7 4.0 - 10.5 K/uL   RBC 3.28 (L) 4.22 - 5.81 MIL/uL   Hemoglobin 9.3 (L) 13.0 - 17.0 g/dL   HCT 28.3 (L) 39.0 - 52.0 %   MCV 86.3 78.0 - 100.0 fL   MCH 28.4 26.0 - 34.0 pg   MCHC 32.9 30.0 - 36.0 g/dL   RDW 16.3 (H) 11.5 - 15.5 %   Platelets 251 150 - 400 K/uL   Neutrophils Relative % 60 43 - 77 %   Neutro Abs 3.4 1.7 - 7.7 K/uL   Lymphocytes Relative 23 12 - 46 %   Lymphs Abs 1.3 0.7 - 4.0 K/uL   Monocytes Relative 15 (H) 3 - 12 %   Monocytes Absolute 0.9 0.1 - 1.0  K/uL   Eosinophils Relative 2 0 - 5 %   Eosinophils Absolute 0.1 0.0 - 0.7 K/uL   Basophils Relative 0 0 - 1 %   Basophils Absolute 0.0 0.0 - 0.1 K/uL  Comprehensive metabolic panel     Status: Abnormal   Collection Time: 05/30/14  4:49 AM  Result Value Ref Range   Sodium 136 (L) 137 - 147 mEq/L   Potassium 4.6 3.7 - 5.3 mEq/L   Chloride 101 96 - 112 mEq/L   CO2 26 19 - 32 mEq/L   Glucose, Bld 92 70 - 99 mg/dL   BUN 13 6 - 23 mg/dL   Creatinine, Ser 0.68 0.50 - 1.35 mg/dL   Calcium 8.2 (L) 8.4 - 10.5 mg/dL   Total Protein 5.7 (L) 6.0 - 8.3 g/dL   Albumin 2.2 (L) 3.5 - 5.2 g/dL   AST 22 0 - 37 U/L   ALT 17 0 - 53 U/L   Alkaline Phosphatase 76 39 - 117 U/L   Total Bilirubin 0.3 0.3 - 1.2 mg/dL   GFR calc non Af Amer >90 >90 mL/min   GFR calc Af Amer >90 >90 mL/min    Comment: (NOTE) The eGFR has been calculated using the CKD EPI equation. This  calculation has not been validated in all clinical situations. eGFR's persistently <90 mL/min signify possible Chronic Kidney Disease.    Anion gap 9 5 - 15     HEENT: normal and poor dentition Cardio: RRR and no murmur Resp: CTA B/L and unlabored GI: BS positive and NT.ND Extremity:  Pulses positive and No Edema Skin:   Intact and Wound C/D/I Neuro: Alert/Oriented, Normal Sensory, Abnormal Motor 4/5 R delt, bi tri, 3- grip, pain inhibition, LUE 5/5 and Abnormal decreased FMC Musc/Skel:  Other pain with Active and passive ROM in RUE and RLE Gen NAD   Assessment/Plan: 1. Functional deficits secondary to Right distal radius fx, Right intertrochanteric fx after fall which require 3+ hours per day of interdisciplinary therapy in a comprehensive inpatient rehab setting. Physiatrist is providing close team supervision and 24 hour management of active medical problems listed below. Physiatrist and rehab team continue to assess barriers to discharge/monitor patient progress toward functional and medical goals. Team conference today please see physician documentation under team conference tab, met with team face-to-face to discuss problems,progress, and goals. Formulized individual treatment plan based on medical history, underlying problem and comorbidities.FIM: FIM - Bathing Bathing Steps Patient Completed: Chest, Right Arm, Left Arm, Abdomen, Front perineal area, Buttocks, Right upper leg, Left upper leg, Left lower leg (including foot) Bathing: 4: Min-Patient completes 8-9 45f10 parts or 75+ percent  FIM - Upper Body Dressing/Undressing Upper body dressing/undressing: 0: Wears gown/pajamas-no public clothing FIM - Lower Body Dressing/Undressing Lower body dressing/undressing: 1: Total-Patient completed less than 25% of tasks (gripper socks only)  FIM - TMusicianDevices: Grab bar or rail for support Toileting: 1: Two helpers     FIM - BShip brokerDevices: WAdult nurseTransfer: 4: Supine > Sit: Min A (steadying Pt. > 75%/lift 1 leg), 4: Sit > Supine: Min A (steadying pt. > 75%/lift 1 leg), 4: Bed > Chair or W/C: Min A (steadying Pt. > 75%), 4: Chair or W/C > Bed: Min A (steadying Pt. > 75%)  FIM - Locomotion: Wheelchair Distance: 65 Locomotion: Wheelchair: 2: Travels 50 - 149 ft with minimal assistance (Pt.>75%) FIM - Locomotion: Ambulation Locomotion: Ambulation Assistive Devices: WEngineer, agriculturalAmbulation/Gait Assistance:  4: Min assist Locomotion: Ambulation: 1: Travels less than 50 ft with minimal assistance (Pt.>75%)  Comprehension Comprehension Mode: Auditory Comprehension: 6-Follows complex conversation/direction: With extra time/assistive device  Expression Expression Mode: Verbal Expression: 6-Expresses complex ideas: With extra time/assistive device  Social Interaction Social Interaction: 7-Interacts appropriately with others - No medications needed.  Problem Solving Problem Solving: 6-Solves complex problems: With extra time  Memory Memory: 6-More than reasonable amt of time  Medical Problem List and Plan: 1. Functional deficits secondary to Right distal radius fx, Right intertrochanteric fx after fall 2.  DVT Prophylaxis/Anticoagulation: Pharmaceutical: Lovenox 3. Pain Management: ON hydrocodone, oxycodone and IV morphine at current time. Will increase oxycodone to 15 mg prn and add low dose OxyContin for more consistent relief. . Continue robaxin prn for muscle spasms.    4. Mood: Team to provide ego support. LCSW to follow for evaluation and support.   5. Neuropsych: This patient is capable of making decisions on his own behalf. 6. Skin/Wound Care: Routine pressure relief measures. Monitor wound daily for healing. Elevation for edema control.   7. Fluids/Electrolytes/Nutrition:  Monitor I/O. Offer supplement as indicated if intake poor.   8. H/o seizures (alcohol related): On dilantin  and has been seizure free for a year. Recheck dilantin level due to decrease in dosage.   9. ABLA: Continue to monitor H/H with routine checks. Continue iron supplement 10. COPD: Encourage IS. Continue Spiriva.   11. Frequency: will check UA/UCS. Urinary retention resolved.  12.  Low alb- likely ETOH related nutritional issues  Add supplements   LOS (Days) 2 A FACE TO FACE EVALUATION WAS PERFORMED  Blake Goya E 05/31/2014, 7:00 AM

## 2014-05-31 NOTE — Plan of Care (Signed)
Problem: RH BOWEL ELIMINATION Goal: RH STG MANAGE BOWEL WITH ASSISTANCE STG Manage Bowel with min Assistance.  Outcome: Progressing Goal: RH STG MANAGE BOWEL W/MEDICATION W/ASSISTANCE STG Manage Bowel with Medication with [place].  Outcome: Progressing  Problem: RH BLADDER ELIMINATION Goal: RH STG MANAGE BLADDER WITH ASSISTANCE STG Manage Bladder With supervision  Outcome: Progressing  Problem: RH SKIN INTEGRITY Goal: RH STG SKIN FREE OF INFECTION/BREAKDOWN Outcome: Progressing Goal: RH STG MAINTAIN SKIN INTEGRITY WITH ASSISTANCE STG Maintain Skin Integrity With [place].  Outcome: Progressing Goal: RH STG ABLE TO PERFORM INCISION/WOUND CARE W/ASSISTANCE STG Able To Perform Incision/Wound Care With [employer].  Outcome: Progressing  Problem: RH PAIN MANAGEMENT Goal: RH STG PAIN MANAGED AT OR BELOW PT'S PAIN GOAL Pain will be less than 3 on 1/10 scale  Outcome: Progressing

## 2014-06-01 ENCOUNTER — Inpatient Hospital Stay (HOSPITAL_COMMUNITY): Payer: Worker's Compensation | Admitting: Occupational Therapy

## 2014-06-01 ENCOUNTER — Inpatient Hospital Stay (HOSPITAL_COMMUNITY): Payer: Self-pay | Admitting: Rehabilitation

## 2014-06-01 NOTE — Progress Notes (Signed)
Subjective/Complaints: 68 y.o. male with history of COPD, alcohol abuse, macular degeneration, who was working on a house on 05/24/14 when he slipped and fell 6-8 feet off a ladder. He hit his right side on concrete with onset of right hip pain and right wrist deformity. Work up in ED revealed right distal radius fracture and right IT hip fracture. He was evaluated by Dr. Erlinda Hong and right wrist was manipulated and splinted in ED and he underwent IM nail of right hip fracture the same day. Post op WBAT on RLE and weight bearing through right elbow. PT/OT evaluations done yesterday and CIR recommended for follow up therapy. Follow up labs on 11/06 with  worsening of ABLA with drop in H/H to 6.5 and he was transfused with 2 units PRBC and is to receive another unit of PRBC today. Therapy ongoing and patient with improvement in orthostatic symptoms but continues to have problems with endurance   No issues overnite Review of Systems - Negative except constipation and right wrist pain  Objective: Vital Signs: Blood pressure 98/49, pulse 71, temperature 98 F (36.7 C), temperature source Oral, resp. rate 17, height '5\' 8"'  (1.727 m), weight 62.3 kg (137 lb 5.6 oz), SpO2 97 %. No results found. Results for orders placed or performed during the hospital encounter of 05/29/14 (from the past 72 hour(s))  Urinalysis, Routine w reflex microscopic     Status: Abnormal   Collection Time: 05/29/14  7:07 PM  Result Value Ref Range   Color, Urine YELLOW YELLOW   APPearance CLEAR CLEAR   Specific Gravity, Urine 1.023 1.005 - 1.030   pH 6.5 5.0 - 8.0   Glucose, UA NEGATIVE NEGATIVE mg/dL   Hgb urine dipstick NEGATIVE NEGATIVE   Bilirubin Urine NEGATIVE NEGATIVE   Ketones, ur 15 (A) NEGATIVE mg/dL   Protein, ur NEGATIVE NEGATIVE mg/dL   Urobilinogen, UA 0.2 0.0 - 1.0 mg/dL   Nitrite NEGATIVE NEGATIVE   Leukocytes, UA NEGATIVE NEGATIVE    Comment: MICROSCOPIC NOT DONE ON URINES WITH NEGATIVE PROTEIN, BLOOD,  LEUKOCYTES, NITRITE, OR GLUCOSE <1000 mg/dL.  Urine culture     Status: None   Collection Time: 05/29/14  7:08 PM  Result Value Ref Range   Specimen Description URINE, CLEAN CATCH    Special Requests NONE    Culture  Setup Time      05/30/2014 02:34 Performed at [facility] Performed at [facility]     Culture NO GROWTH Performed at [facility]     Report Status 05/31/2014 FINAL   CBC WITH DIFFERENTIAL     Status: Abnormal   Collection Time: 05/30/14  4:49 AM  Result Value Ref Range   WBC 5.7 4.0 - 10.5 K/uL   RBC 3.28 (L) 4.22 - 5.81 MIL/uL   Hemoglobin 9.3 (L) 13.0 - 17.0 g/dL   HCT 28.3 (L) 39.0 - 52.0 %   MCV 86.3 78.0 - 100.0 fL   MCH 28.4 26.0 - 34.0 pg   MCHC 32.9 30.0 - 36.0 g/dL   RDW 16.3 (H) 11.5 - 15.5 %   Platelets 251 150 - 400 K/uL   Neutrophils Relative % 60 43 - 77 %   Neutro Abs 3.4 1.7 - 7.7 K/uL   Lymphocytes Relative 23 12 - 46 %   Lymphs Abs 1.3 0.7 - 4.0 K/uL   Monocytes Relative 15 (H) 3 - 12 %   Monocytes Absolute 0.9 0.1 - 1.0 K/uL  Eosinophils Relative 2 0 - 5 %   Eosinophils Absolute 0.1 0.0 - 0.7 K/uL   Basophils Relative 0 0 - 1 %   Basophils Absolute 0.0 0.0 - 0.1 K/uL  Comprehensive metabolic panel     Status: Abnormal   Collection Time: 05/30/14  4:49 AM  Result Value Ref Range   Sodium 136 (L) 137 - 147 mEq/L   Potassium 4.6 3.7 - 5.3 mEq/L   Chloride 101 96 - 112 mEq/L   CO2 26 19 - 32 mEq/L   Glucose, Bld 92 70 - 99 mg/dL   BUN 13 6 - 23 mg/dL   Creatinine, Ser 0.68 0.50 - 1.35 mg/dL   Calcium 8.2 (L) 8.4 - 10.5 mg/dL   Total Protein 5.7 (L) 6.0 - 8.3 g/dL   Albumin 2.2 (L) 3.5 - 5.2 g/dL   AST 22 0 - 37 U/L   ALT 17 0 - 53 U/L   Alkaline Phosphatase 76 39 - 117 U/L   Total Bilirubin 0.3 0.3 - 1.2 mg/dL   GFR calc non Af Amer >90 >90 mL/min   GFR calc Af Amer >90 >90 mL/min    Comment: (NOTE) The eGFR has been calculated using the CKD EPI equation. This calculation has  not been validated in all clinical situations. eGFR's persistently <90 mL/min signify possible Chronic Kidney Disease.    Anion gap 9 5 - 15     HEENT: normal and poor dentition Cardio: RRR and no murmur Resp: CTA B/L and unlabored GI: BS positive and NT.ND Extremity:  Pulses positive and No Edema Skin:   Intact and Wound C/D/I Neuro: Alert/Oriented, Normal Sensory, Abnormal Motor 4/5 R delt, bi tri, 3- grip, pain inhibition, LUE 5/5 and Abnormal decreased FMC Musc/Skel:  Other pain with Active and passive ROM in RUE and RLE Gen NAD   Assessment/Plan: 1. Functional deficits secondary to Right distal radius fx, Right intertrochanteric fx after fall which require 3+ hours per day of interdisciplinary therapy in a comprehensive inpatient rehab setting. Physiatrist is providing close team supervision and 24 hour management of active medical problems listed below. Physiatrist and rehab team continue to assess barriers to discharge/monitor patient progress toward functional and medical goals. Marland KitchenFIM: FIM - Bathing Bathing Steps Patient Completed: Chest, Right Arm, Left Arm, Abdomen, Front perineal area, Buttocks, Right upper leg, Left upper leg, Left lower leg (including foot) Bathing: 4: Min-Patient completes 8-9 40f10 parts or 75+ percent  FIM - Upper Body Dressing/Undressing Upper body dressing/undressing: 0: Wears gown/pajamas-no public clothing FIM - Lower Body Dressing/Undressing Lower body dressing/undressing: 0: Wears gInterior and spatial designer FIM - TMusicianDevices: Grab bar or rail for support Toileting: 1: Two helpers     FIM - BControl and instrumentation engineerDevices: WCopy 4: Supine > Sit: Min A (steadying Pt. > 75%/lift 1 leg), 4: Sit > Supine: Min A (steadying pt. > 75%/lift 1 leg), 4: Bed > Chair or W/C: Min A (steadying Pt. > 75%), 4: Chair or W/C > Bed: Min A (steadying Pt. > 75%)  FIM - Locomotion:  Wheelchair Distance: 65 Locomotion: Wheelchair: 2: Travels 50 - 149 ft with minimal assistance (Pt.>75%) FIM - Locomotion: Ambulation Locomotion: Ambulation Assistive Devices: WEngineer, agriculturalAmbulation/Gait Assistance: 5: Supervision, 4: Min guard Locomotion: Ambulation: 2: Travels 50 - 149 ft with minimal assistance (Pt.>75%)  Comprehension Comprehension Mode: Auditory Comprehension: 6-Follows complex conversation/direction: With extra time/assistive device  Expression Expression Mode: Verbal Expression: 6-Expresses complex ideas: With  extra time/assistive device  Social Interaction Social Interaction: 7-Interacts appropriately with others - No medications needed.  Problem Solving Problem Solving: 6-Solves complex problems: With extra time  Memory Memory: 6-More than reasonable amt of time  Medical Problem List and Plan: 1. Functional deficits secondary to Right distal radius fx, Right intertrochanteric fx after fall 2.  DVT Prophylaxis/Anticoagulation: Pharmaceutical: Lovenox 3. Pain Management: ON hydrocodone, oxycodone and IV morphine at current time. Will increase oxycodone to 15 mg prn and add low dose OxyContin for more consistent relief. . Continue robaxin prn for muscle spasms.    4. Mood: Team to provide ego support. LCSW to follow for evaluation and support.   5. Neuropsych: This patient is capable of making decisions on his own behalf. 6. Skin/Wound Care: Routine pressure relief measures. Monitor wound daily for healing. Elevation for edema control.   7. Fluids/Electrolytes/Nutrition:  Monitor I/O. Offer supplement as indicated if intake poor.   8. H/o seizures (alcohol related): On dilantin and has been seizure free for a year. Recheck dilantin level due to decrease in dosage.   9. ABLA: Continue to monitor H/H with routine checks. Continue iron supplement 10. COPD: Encourage IS. Continue Spiriva.   11. Frequency: will check UA/UCS. Urinary retention resolved.   12.  Low alb- likely ETOH related nutritional issues  Add supplements   LOS (Days) 3 A FACE TO FACE EVALUATION WAS PERFORMED  KIRSTEINS,ANDREW E 06/01/2014, 6:58 AM

## 2014-06-01 NOTE — Progress Notes (Signed)
Occupational Therapy Session Note  Patient Details  Name: Joshua Reed MRN: [id number] Date of Birth: 03-01-1946  Today's Date: 06/01/2014 OT Individual Time: 2458-0998 and 1255-1355 OT Individual Time Calculation (min): 60 min and 60 min   Short Term Goals: Week 1:  OT Short Term Goal 1 (Week 1): STGs = to LTGs set at supervision to min assist based on LOS.  Skilled Therapeutic Interventions/Progress Updates:  Session 1: Pt seated in recliner chair with RN present in room upon entering. Pt receiving pain medication and reports 7/10 pain in R wrist and hip described as dull ache. OT session with focus on self care with use of AE as needed, STS, standing balance, and functional mobility. Pt performed STS from wheelchair and recliner chair with close supervision this session. Pt ambulated with steady assist to sink side for ADL tasks. Pt standing to perform grooming tasks at sink with set up A to open toothpaste. Pt seated to wash UB and standing in order to wash peri area and buttocks with steady assist. Pt requiring assist to wash B feet during sesson. OT educated and demonstrated use of long handled reacher to thread B elastic waist pants. Pt demonstrated understanding with increased time and verbal cues for proper technique. Pt reporting he wished to discharge shower goals as he declined today. Pt stating, "I am only gonna take a sponge bath at home." Pt requesting to return to bed secondary to increased pain. Pt utilized leg lifter and performed sit >supine with supervision. Pt R UE elevated and ice applied to R hip and wrist. Pt supine with bed alarm on and all needed items within reach.   Session 2: Pt supine in bed with R UE elevated and pt reporting 5/10 pain in R wrist and R hip this session. His wife present in room and appearing to be very anxious about therapy session and patient progress. Therapist recommended she stay for education but she declined. OT dicussed pt progress towards  goals and requested her presence this week to begin family education and she verbalized understanding. Pt performed supine >sit with supervision and use of leg lifter. Supervision for STS from bed height as well as wheelchair x 4 reps this session with use of platform walker. Once seated in wheelchair, therapist assisted pt down stairs to gift shop. Pt ambulated in gift shop with use of RW ~ 100 feet while navigating aisles and turning around in tight spaces with RW. Pt pausing 3 times to take rest break but not sitting down. OT assisted pt in wheelchair back to room where pt positioned in recliner chair with ice applied to R wrist and B LEs reclined. Call bell within and all other needed items within reach upon exiting the room.  Therapy Documentation Precautions:  Precautions Precautions: Fall Restrictions Weight Bearing Restrictions: Yes RUE Weight Bearing: Non weight bearing RLE Weight Bearing: Weight bearing as tolerated Other Position/Activity Restrictions: Pt can weightbear through the right forearm.  See FIM for current functional status  Therapy/Group: Individual Therapy  Phineas Semen 06/01/2014, 10:52 AM

## 2014-06-01 NOTE — Plan of Care (Signed)
Problem: RH BOWEL ELIMINATION Goal: RH STG MANAGE BOWEL W/MEDICATION W/ASSISTANCE STG Manage Bowel with Medication with Assistance. Mod I  Outcome: Progressing     

## 2014-06-01 NOTE — Plan of Care (Signed)
Problem: RH BOWEL ELIMINATION Goal: RH STG MANAGE BOWEL WITH ASSISTANCE STG Manage Bowel with Assistance. Mod I  Outcome: Progressing     

## 2014-06-01 NOTE — Plan of Care (Signed)
Problem: RH Tub/Shower Transfers Goal: LTG Patient will perform tub/shower transfers w/assist (OT) LTG: Patient will perform tub/shower transfers with assist, with/without cues using equipment (OT)  Outcome: Not Applicable Date Met:  71/27/87 Pt declines shower transfer and wishes to discharge goal at this time. Pt stated, "I am not getting into the shower here or at home."

## 2014-06-01 NOTE — Plan of Care (Signed)
Problem: RH BOWEL ELIMINATION Goal: RH STG MANAGE BOWEL WITH ASSISTANCE STG Manage Bowel with min Assistance.  Outcome: Progressing Goal: RH STG MANAGE BOWEL W/MEDICATION W/ASSISTANCE STG Manage Bowel with Medication with [place].  Outcome: Progressing  Problem: RH BLADDER ELIMINATION Goal: RH STG MANAGE BLADDER WITH ASSISTANCE STG Manage Bladder With supervision  Outcome: Progressing  Problem: RH SKIN INTEGRITY Goal: RH STG SKIN FREE OF INFECTION/BREAKDOWN Outcome: Progressing Goal: RH STG MAINTAIN SKIN INTEGRITY WITH ASSISTANCE STG Maintain Skin Integrity With [place].  Outcome: Progressing Goal: RH STG ABLE TO PERFORM INCISION/WOUND CARE W/ASSISTANCE STG Able To Perform Incision/Wound Care With [employer].  Outcome: Progressing  Problem: RH PAIN MANAGEMENT Goal: RH STG PAIN MANAGED AT OR BELOW PT'S PAIN GOAL Pain will be less than 3 on 1/10 scale  Outcome: Progressing

## 2014-06-01 NOTE — Plan of Care (Signed)
Problem: RH BLADDER ELIMINATION Goal: RH STG MANAGE BLADDER WITH ASSISTANCE STG Manage Bladder -Mod I  Outcome: Progressing

## 2014-06-01 NOTE — Progress Notes (Signed)
Physical Therapy Session Note  Patient Details  Name: Joshua Reed MRN: [id number] Date of Birth: Sep 14, 1945  Today's Date: 06/01/2014 PT Individual Time: 0830-0930 PT Individual Time Calculation (min): 60 min   Short Term Goals: Week 1:  PT Short Term Goal 1 (Week 1): =LTG's due ELOS  Skilled Therapeutic Interventions/Progress Updates:   Pt received lying in bed, agreeable to therapy session.  Performed bed mobility at S level with use of leg lifter.  Min cues to maintain NWB through R wrist and through elbow only.  Donned shoes with min A.  Ambulated x 60' to ADL apt in order to practiced bed mobility from similar home set up.  Provided min cues for upright posture and increased R hip/knee flex with continued gait.  Utilized leg lifter with min A for safety of RLE into bed.  Again, min cues for NWB through R wrist.  Once in supine, performed therex; R LE SLR x 10 reps, RLE heel slides x 10 reps, and B knee presses x 10 reps (all with assist of leg lifter and min A from therapist for safety during [place]).  Pt returned to sitting with use of leg lifter at S level.  Ambulated out of ADL apt to therapy gym at S level.  Ended session with sit<>stand from low surface, as well as standing balance activity while reaching with LUE to the R to increase WB through RLE.  Pt tolerated well.  Ambulated back to room and left in recliner with all needs in reach and RN in room to provide meds.   Therapy Documentation Precautions:  Precautions Precautions: Fall Restrictions Weight Bearing Restrictions: Yes RUE Weight Bearing: Non weight bearing RLE Weight Bearing: Weight bearing as tolerated Other Position/Activity Restrictions: Pt can weightbear through the right forearm.   Vital Signs: Therapy Vitals Temp: 98 F (36.7 C) Temp Source: Oral Pulse Rate: 71 Resp: 17 BP: (!) 98/49 mmHg Oxygen Therapy SpO2: 98 % O2 Device: Not Delivered Pain: Pain Assessment Pain Assessment: 0-10 Pain Score: 9   Pain Type: Surgical pain Pain Location: Arm Pain Intervention(s): Medication (See eMAR) See FIM for current functional status  Therapy/Group: Individual Therapy  Denice Bors 06/01/2014, 8:56 AM

## 2014-06-01 NOTE — Plan of Care (Signed)
Problem: RH PAIN MANAGEMENT Goal: RH STG PAIN MANAGED AT OR BELOW PT'S PAIN GOAL Pain will be less than 3 on 1/10 scale  Outcome: Not Progressing Reports pain as 7

## 2014-06-02 ENCOUNTER — Inpatient Hospital Stay (HOSPITAL_COMMUNITY): Payer: Self-pay | Admitting: Rehabilitation

## 2014-06-02 ENCOUNTER — Inpatient Hospital Stay (HOSPITAL_COMMUNITY): Payer: Worker's Compensation | Admitting: *Deleted

## 2014-06-02 NOTE — Progress Notes (Signed)
Occupational Therapy Session Note  Patient Details  Name: TYKWON FERA MRN: [id number] Date of Birth: 1946-05-23  Today's Date: 06/02/2014 OT Individual Time:  -   1300-1400   (60 min)  1st session                                        1515-1615   (60 min)  2nd session   Short Term Goals: Week 1:  OT Short Term Goal 1 (Week 1): STGs = to LTGs set at supervision to min assist based on LOS.     Skilled Therapeutic Interventions/Progress Updates:      1st session:  Focus of treatment:  Transfers, sit to stand, transitional movements, functional mobility.  Pt. Went from supine to EOB using leg lifter and bed rails. Provide education on pushing through elbow and not hand.   He ambulated around room and retrieved items for bath.  Ambulated to bathroom and use BSC over toilet.  Pt stood and performed hygiene and donned pants. Informed nursing of BM and urine.   Ambulated back to sink and completed ADL. Pt. Returned to bed and went to supine position with minimal assist and leg lifter.     2nd session:  Pain: 5/10   Right hip  Pt. Lying in bed.  Used leg lifter to come to EOB with SBA and increased time.  Pt.ambulated to gym.  Utilized nustep for 10 minutes at 1 work load for 90 steps.  Transferred over to mat and utlized 2 # arm weights on left arm and AROM on RUE.  Ambulated back to room.  Pt. Returned to bed using leg lifter and minimal assist.  Applied ice to Right quadriceps and right forearm.  Left pt with all needs in reach.    Therapy Documentation Precautions:  Precautions Precautions: Fall Restrictions Weight Bearing Restrictions: Yes RUE Weight Bearing: Non weight bearing (on wrist) RLE Weight Bearing: Weight bearing as tolerated Other Position/Activity Restrictions: Pt can weightbear through the right forearm.    Pain: Pain Assessment Pain Assessment: 0-10 Pain Score: 7;  1st session:  Pain Location: Arm Pain Orientation: Right Pain Onset: On-going Patients Stated Pain  Goal: 6 Pain Intervention(s): Medication (See eMAR)       Therapy/Group: Individual Therapy  Lisa Roca 06/02/2014, 1:37 PM

## 2014-06-02 NOTE — Progress Notes (Signed)
Physical Therapy Session Note  Patient Details  Name: Joshua Reed MRN: [id number] Date of Birth: 1946-02-21  Today's Date: 06/02/2014 PT Individual Time: 0830-0930 PT Individual Time Calculation (min): 60 min   Short Term Goals: Week 1:  PT Short Term Goal 1 (Week 1): =LTG's due ELOS  Skilled Therapeutic Interventions/Progress Updates:   Pt received lying in bed, agreeable to therapy session.  Performed bed mobility at S level with use of leg lifter with HOB flat and without bed rails to better simulate home environment.  Once at EOB, pt able to don shoes on his own with use of back scratcher as shoe horn.  Ambulated to/from therapy gym with R PFRW x 110' at S level with min cues for upright posture and increasing L step length.  Also introduced step through technique with therapist assisting to keep RW moving, however this is still difficult for pt due to pain in RLE.  Once in therapy gym, issued OTAGO HEP for balance and strengthening.  Performed seated LAQs BLE x 10 reps, standing hip abd x 10 reps BLE, BLE knee flex x 10 reps, BLE heel raises x 10 reps and BLE mini squats x 10 reps with UE support and min cues and demonstration cues for correct technique.  Ended session with continued practice with stair negotiation.  Performed 2, 6" steps with R PFRW at min A level to elevate and lower RW on R side to maintain precautions.  Min cues for maintaining correct WB through R elbow.  Pt ambulated back to room as stated above.  Note slight improvement in gait speed, however continues to have very slow gait speed.  Left in recliner with all needs in reach.       Therapy Documentation Precautions:  Precautions Precautions: Fall Restrictions Weight Bearing Restrictions: Yes RUE Weight Bearing: Non weight bearing (on wrist) RLE Weight Bearing: Weight bearing as tolerated Other Position/Activity Restrictions: Pt can weightbear through the right forearm.   Vital Signs: Oxygen Therapy O2  Device: Not Delivered Pain: Pain Assessment Pain Assessment: 0-10 Pain Score: 5  Pain Location: Hip Pain Orientation: Right Pain Intervention(s): Medication (See eMAR)  See FIM for current functional status  Therapy/Group: Individual Therapy  Denice Bors 06/02/2014, 9:04 AM

## 2014-06-02 NOTE — Plan of Care (Signed)
Problem: RH BOWEL ELIMINATION Goal: RH STG MANAGE BOWEL WITH ASSISTANCE STG Manage Bowel with Assistance. Mod I  Outcome: Progressing Goal: RH STG MANAGE BOWEL W/MEDICATION W/ASSISTANCE STG Manage Bowel with Medication with Assistance. Mod I  Outcome: Progressing  Problem: RH BLADDER ELIMINATION Goal: RH STG MANAGE BLADDER WITH ASSISTANCE STG Manage Bladder -Mod I  Outcome: Progressing  Problem: RH SKIN INTEGRITY Goal: RH STG SKIN FREE OF INFECTION/BREAKDOWN Outcome: Progressing Goal: RH STG MAINTAIN SKIN INTEGRITY WITH ASSISTANCE STG Maintain Skin Integrity With [place].  Outcome: Progressing Goal: RH STG ABLE TO PERFORM INCISION/WOUND CARE W/ASSISTANCE STG Able To Perform Incision/Wound Care With [employer].  Outcome: Progressing  Problem: RH PAIN MANAGEMENT Goal: RH STG PAIN MANAGED AT OR BELOW PT'S PAIN GOAL Pain will be less than 3 on 1/10 scale  Outcome: Progressing

## 2014-06-02 NOTE — Progress Notes (Signed)
Subjective/Complaints: 68 y.o. male with history of COPD, alcohol abuse, macular degeneration, who was working on a house on 05/24/14 when he slipped and fell 6-8 feet off a ladder. He hit his right side on concrete with onset of right hip pain and right wrist deformity. Work up in ED revealed right distal radius fracture and right IT hip fracture. He was evaluated by Dr. Erlinda Hong and right wrist was manipulated and splinted in ED and he underwent IM nail of right hip fracture the same day. Post op WBAT on RLE and weight bearing through right elbow. PT/OT evaluations done yesterday and CIR recommended for follow up therapy. Follow up labs on 11/06 with  worsening of ABLA with drop in H/H to 6.5 and he was transfused with 2 units PRBC and is to receive another unit of PRBC today. Therapy ongoing and patient with improvement in orthostatic symptoms but continues to have problems with endurance   Slept better BM yest pm, voiding ok DIscussed smoking cessation Review of Systems - Negative except constipation and right wrist pain  Objective: Vital Signs: Blood pressure 114/67, pulse 82, temperature 98.7 F (37.1 C), temperature source Oral, resp. rate 20, height 5\' 8"  (1.727 m), weight 62.3 kg (137 lb 5.6 oz), SpO2 96 %. No results found. No results found for this or any previous visit (from the past 72 hour(s)).   HEENT: normal and poor dentition Cardio: RRR and no murmur Resp: CTA B/L and unlabored GI: BS positive and NT.ND Extremity:  Pulses positive and No Edema Skin:   Intact and Wound C/D/I Neuro: Alert/Oriented, Normal Sensory, Abnormal Motor 4/5 R delt, bi tri, 3- grip, pain inhibition, LUE 5/5 and Abnormal decreased FMC Musc/Skel:  Other pain with Active and passive ROM in RUE and RLE Gen NAD   Assessment/Plan: 1. Functional deficits secondary to Right distal radius fx, Right intertrochanteric fx after fall which require 3+ hours per day of interdisciplinary therapy in a comprehensive  inpatient rehab setting. Physiatrist is providing close team supervision and 24 hour management of active medical problems listed below. Physiatrist and rehab team continue to assess barriers to discharge/monitor patient progress toward functional and medical goals. Marland KitchenFIM: FIM - Bathing Bathing Steps Patient Completed: Chest, Right Arm, Left Arm, Abdomen, Front perineal area, Buttocks, Right upper leg, Left upper leg, Left lower leg (including foot) Bathing: 4: Min-Patient completes 8-9 84f 10 parts or 75+ percent  FIM - Upper Body Dressing/Undressing Upper body dressing/undressing steps patient completed: Thread/unthread right sleeve of pullover shirt/dresss, Thread/unthread left sleeve of pullover shirt/dress, Put head through opening of pull over shirt/dress, Pull shirt over trunk Upper body dressing/undressing: 5: Set-up assist to: Obtain clothing/put away FIM - Lower Body Dressing/Undressing Lower body dressing/undressing steps patient completed: Thread/unthread right pants leg, Thread/unthread left pants leg, Pull pants up/down Lower body dressing/undressing: 4: Steadying Assist  FIM - Toileting Toileting Assistive Devices: Grab bar or rail for support Toileting: 1: Two helpers     FIM - Control and instrumentation engineer Devices: Copy: 5: Supine > Sit: Supervision (verbal cues/safety issues), 5: Sit > Supine: Supervision (verbal cues/safety issues), 4: Bed > Chair or W/C: Min A (steadying Pt. > 75%), 4: Chair or W/C > Bed: Min A (steadying Pt. > 75%)  FIM - Locomotion: Wheelchair Distance: 65 Locomotion: Wheelchair: 0: Activity did not occur FIM - Locomotion: Ambulation Locomotion: Ambulation Assistive Devices: Engineer, agricultural Ambulation/Gait Assistance: 5: Supervision, 4: Min guard Locomotion: Ambulation: 2: Travels 50 - 149 ft with minimal  assistance (Pt.>75%)  Comprehension Comprehension Mode: Auditory Comprehension: 6-Follows complex  conversation/direction: With extra time/assistive device  Expression Expression Mode: Verbal Expression: 6-Expresses complex ideas: With extra time/assistive device  Social Interaction Social Interaction: 7-Interacts appropriately with others - No medications needed.  Problem Solving Problem Solving: 6-Solves complex problems: With extra time  Memory Memory: 6-More than reasonable amt of time  Medical Problem List and Plan: 1. Functional deficits secondary to Right distal radius fx, Right intertrochanteric fx after fall 2.  DVT Prophylaxis/Anticoagulation: Pharmaceutical: Lovenox 3. Pain Management: ON hydrocodone, oxycodone and IV morphine at current time. Will increase oxycodone to 15 mg prn and add low dose OxyContin for more consistent relief. . Continue robaxin prn for muscle spasms.    4. Mood: Team to provide ego support. LCSW to follow for evaluation and support.   5. Neuropsych: This patient is capable of making decisions on his own behalf. 6. Skin/Wound Care: Routine pressure relief measures. Monitor wound daily for healing. Elevation for edema control.   7. Fluids/Electrolytes/Nutrition:  Monitor I/O. Offer supplement as indicated if intake poor.   8. H/o seizures (alcohol related): On dilantin and has been seizure free for a year. Recheck dilantin level due to decrease in dosage.   9. ABLA: Continue to monitor H/H with routine checks. Continue iron supplement 10. COPD: Encourage IS. Continue Spiriva.   11. Frequency: will check UA/UCS. Urinary retention resolved.  12.  Low alb- likely ETOH related nutritional issues  Add supplements   LOS (Days) 4 A FACE TO FACE EVALUATION WAS PERFORMED  Joshua Reed 06/02/2014, 6:58 AM

## 2014-06-02 NOTE — Plan of Care (Signed)
Problem: RH BOWEL ELIMINATION Goal: RH STG MANAGE BOWEL WITH ASSISTANCE STG Manage Bowel with Assistance. Mod I  Outcome: Progressing Goal: RH STG MANAGE BOWEL W/MEDICATION W/ASSISTANCE STG Manage Bowel with Medication with Assistance. Mod I  Outcome: Progressing  Problem: RH BLADDER ELIMINATION Goal: RH STG MANAGE BLADDER WITH ASSISTANCE STG Manage Bladder -Mod I  Outcome: Progressing  Problem: RH SKIN INTEGRITY Goal: RH STG MAINTAIN SKIN INTEGRITY WITH ASSISTANCE STG Maintain Skin Integrity With [facility].  Outcome: Progressing Goal: RH STG ABLE TO PERFORM INCISION/WOUND CARE W/ASSISTANCE STG Able To Perform Incision/Wound Care With [employer].  Outcome: Progressing  Problem: RH PAIN MANAGEMENT Goal: RH STG PAIN MANAGED AT OR BELOW PT'S PAIN GOAL Pain will be less than 3 on 1/10 scale  Outcome: Not Progressing Requiring prn meds q 4 hrs with q 12 CR oxy and robaxin with tylenol tween oxy 15mg  q 4 and ice with pain in hip >6 esp with activity

## 2014-06-02 NOTE — Progress Notes (Signed)
Social Work Patient ID: Joshua Reed, male   DOB: 1946-06-20, 68 y.o.   MRN: [id number] Emailed pt's prescriptions for home health and equipment needed at discharge to [facility] [employer] [facility] [place]. She will make the arrangements for both equipment and home health follow up.  Wife will come in Monday to go through therapies with pt. Continue to work on discharge plans.

## 2014-06-03 ENCOUNTER — Inpatient Hospital Stay (HOSPITAL_COMMUNITY): Payer: Worker's Compensation | Admitting: Physical Therapy

## 2014-06-03 DIAGNOSIS — S72141D Displaced intertrochanteric fracture of right femur, subsequent encounter for closed fracture with routine healing: Principal | ICD-10-CM

## 2014-06-03 DIAGNOSIS — T1490 Injury, unspecified: Secondary | ICD-10-CM

## 2014-06-03 MED ORDER — DOCUSATE SODIUM 100 MG PO CAPS
100.0000 mg | ORAL_CAPSULE | Freq: Two times a day (BID) | ORAL | Status: DC
  Administered 2014-06-05: 100 mg via ORAL
  Filled 2014-06-03 (×7): qty 1

## 2014-06-03 NOTE — Progress Notes (Signed)
Physical Therapy Session Note  Patient Details  Name: Joshua Reed MRN: [id number] Date of Birth: 15-Jan-1946  Today's Date: 06/03/2014 PT Individual Time: 1400-1500 PT Individual Time Calculation (min): 60 min   Short Term Goals: Week 1:  PT Short Term Goal 1 (Week 1): =LTG's due ELOS  Skilled Therapeutic Interventions/Progress Updates:  Pt was seen bedside in the pm. Pt transferred supine to edge of bed with min A and verbal cues. Pt transferred sit to stand with S and R PFRW. Pt ambulated 125 feet with R PFRW and S with occasional verbal cues. Pt transferred edge of mat to supine with min A and verbal cues. Pt performed LE exercises for strengthening and flexibility. Pt transferred supine to edge of mat with min A and verbal cues. Pt transferred sit to stand with S and R PFRW. Pt ambulated 125 feet with R PFRW and S. Pt transferred edge of bed to supine with min A.   Therapy Documentation Precautions:  Precautions Precautions: Fall Restrictions Weight Bearing Restrictions: Yes RUE Weight Bearing: Weight bear through elbow only RLE Weight Bearing: Weight bearing as tolerated Other Position/Activity Restrictions: Pt can weightbear through the right forearm. General:   Pain: Pt c/o mod pain R wrist and R hip with movement.    Locomotion : Ambulation Ambulation/Gait Assistance: 5: Supervision   See FIM for current functional status  Therapy/Group: Individual Therapy  [name], Patty 06/03/2014, 3:29 PM

## 2014-06-03 NOTE — Plan of Care (Signed)
Problem: RH BOWEL ELIMINATION Goal: RH STG MANAGE BOWEL WITH ASSISTANCE STG Manage Bowel with Assistance. Mod I  Outcome: Progressing Goal: RH STG MANAGE BOWEL W/MEDICATION W/ASSISTANCE STG Manage Bowel with Medication with Assistance. Mod I  Outcome: Progressing Large cont BM after senna-docusate given as scheduled  Problem: RH PAIN MANAGEMENT Goal: RH STG PAIN MANAGED AT OR BELOW PT'S PAIN GOAL Pain will be less than 3 on 1/10 scale  Outcome: Progressing No c/o pain this shift, OxyCODONE12 hr tablet 10 mg given as scheduled

## 2014-06-03 NOTE — Progress Notes (Signed)
Subjective/Complaints: 68 y.o. male with history of COPD, alcohol abuse, macular degeneration, who was working on a house on 05/24/14 when he slipped and fell 6-8 feet off a ladder. He hit his right side on concrete with onset of right hip pain and right wrist deformity. Work up in ED revealed right distal radius fracture and right IT hip fracture. He was evaluated by Dr. Erlinda Hong and right wrist was manipulated and splinted in ED and he underwent IM nail of right hip fracture the same day. Post op WBAT on RLE and weight bearing through right elbow.  Loose stools, no abd pain, no nausea or vomiting.  Appetite ok, had constipation prior to laxatives Review of Systems - Negative except constipation and right wrist pain  Objective: Vital Signs: Blood pressure 110/72, pulse 79, temperature 98 F (36.7 C), temperature source Oral, resp. rate 18, height 5\' 8"  (1.727 m), weight 62.3 kg (137 lb 5.6 oz), SpO2 100 %. No results found. No results found for this or any previous visit (from the past 72 hour(s)).   HEENT: normal and poor dentition Cardio: RRR and no murmur Resp: CTA B/L and unlabored GI: BS positive and NT.ND Extremity:  Pulses positive and No Edema Skin:   Intact and Wound C/D/I Neuro: Alert/Oriented, Normal Sensory, Abnormal Motor 4/5 R delt, bi tri, 3- grip, pain inhibition, LUE 5/5 and Abnormal decreased FMC Musc/Skel:  Other pain with Active and passive ROM in RUE and RLE Gen NAD   Assessment/Plan: 1. Functional deficits secondary to Right distal radius fx, Right intertrochanteric fx after fall which require 3+ hours per day of interdisciplinary therapy in a comprehensive inpatient rehab setting. Physiatrist is providing close team supervision and 24 hour management of active medical problems listed below. Physiatrist and rehab team continue to assess barriers to discharge/monitor patient progress toward functional and medical goals. Marland KitchenFIM: FIM - Bathing Bathing Steps Patient  Completed: Chest, Right Arm, Left Arm, Abdomen, Front perineal area, Buttocks, Right upper leg, Left upper leg, Left lower leg (including foot) Bathing: 4: Min-Patient completes 8-9 14f 10 parts or 75+ percent  FIM - Upper Body Dressing/Undressing Upper body dressing/undressing steps patient completed: Thread/unthread right sleeve of pullover shirt/dresss, Thread/unthread left sleeve of pullover shirt/dress, Put head through opening of pull over shirt/dress, Pull shirt over trunk Upper body dressing/undressing: 5: Set-up assist to: Obtain clothing/put away FIM - Lower Body Dressing/Undressing Lower body dressing/undressing steps patient completed: Thread/unthread right pants leg, Thread/unthread left pants leg, Pull pants up/down Lower body dressing/undressing: 4: Steadying Assist  FIM - Toileting Toileting Assistive Devices: Grab bar or rail for support Toileting: 1: Two helpers     FIM - Control and instrumentation engineer Devices: Copy: 5: Supine > Sit: Supervision (verbal cues/safety issues), 5: Sit > Supine: Supervision (verbal cues/safety issues), 5: Chair or W/C > Bed: Supervision (verbal cues/safety issues), 5: Bed > Chair or W/C: Supervision (verbal cues/safety issues)  FIM - Locomotion: Wheelchair Distance: 65 Locomotion: Wheelchair: 0: Activity did not occur FIM - Locomotion: Ambulation Locomotion: Ambulation Assistive Devices: Engineer, agricultural Ambulation/Gait Assistance: 5: Supervision Locomotion: Ambulation: 2: Travels 50 - 149 ft with supervision/safety issues  Comprehension Comprehension Mode: Auditory Comprehension: 6-Follows complex conversation/direction: With extra time/assistive device  Expression Expression Mode: Verbal Expression: 6-Expresses complex ideas: With extra time/assistive device  Social Interaction Social Interaction: 7-Interacts appropriately with others - No medications needed.  Problem Solving Problem  Solving: 6-Solves complex problems: With extra time  Memory Memory: 6-More than reasonable amt of time  Medical Problem List and Plan: 1. Functional deficits secondary to Right distal radius fx, Right intertrochanteric fx after fall 2.  DVT Prophylaxis/Anticoagulation: Pharmaceutical: Lovenox 3. Pain Management: ON hydrocodone, oxycodone and IV morphine at current time. Will increase oxycodone to 15 mg prn and add low dose OxyContin for more consistent relief. . Continue robaxin prn for muscle spasms.    4. Mood: Team to provide ego support. LCSW to follow for evaluation and support.   5. Neuropsych: This patient is capable of making decisions on his own behalf. 6. Skin/Wound Care: Routine pressure relief measures. Monitor wound daily for healing. Elevation for edema control.   7. Fluids/Electrolytes/Nutrition:  Monitor I/O. Offer supplement as indicated if intake poor.   8. H/o seizures (alcohol related): On dilantin and has been seizure free for a year. Recheck dilantin level due to decrease in dosage.   9. ABLA: Continue to monitor H/H with routine checks. Continue iron supplement 10. COPD: Encourage IS. Continue Spiriva.   11. Frequency:  UA/neg . Urinary retention resolved.  12.  Low alb- likely ETOH related nutritional issues  Add supplements   13.  Constipation  Resolved, now too loose on senna S, change to colace LOS (Days) 5 A FACE TO FACE EVALUATION WAS PERFORMED  KIRSTEINS,ANDREW E 06/03/2014, 6:55 AM

## 2014-06-03 NOTE — Plan of Care (Signed)
Problem: RH BOWEL ELIMINATION Goal: RH STG MANAGE BOWEL WITH ASSISTANCE STG Manage Bowel with Assistance. Mod I  Outcome: Progressing Goal: RH STG MANAGE BOWEL W/MEDICATION W/ASSISTANCE STG Manage Bowel with Medication with Assistance. Mod I  Outcome: Progressing  Problem: RH BLADDER ELIMINATION Goal: RH STG MANAGE BLADDER WITH ASSISTANCE STG Manage Bladder -Mod I  Outcome: Progressing  Problem: RH SKIN INTEGRITY Goal: RH STG MAINTAIN SKIN INTEGRITY WITH ASSISTANCE STG Maintain Skin Integrity With [facility].  Outcome: Progressing Goal: RH STG ABLE TO PERFORM INCISION/WOUND CARE W/ASSISTANCE STG Able To Perform Incision/Wound Care With [employer].  Outcome: Progressing  Problem: RH PAIN MANAGEMENT Goal: RH STG PAIN MANAGED AT OR BELOW PT'S PAIN GOAL Pain will be less than 3 on 1/10 scale  Outcome: Progressing

## 2014-06-04 ENCOUNTER — Inpatient Hospital Stay (HOSPITAL_COMMUNITY): Payer: Medicare HMO | Admitting: Rehabilitation

## 2014-06-04 ENCOUNTER — Inpatient Hospital Stay (HOSPITAL_COMMUNITY): Payer: Worker's Compensation | Admitting: Occupational Therapy

## 2014-06-04 MED ORDER — OXYCODONE HCL ER 15 MG PO T12A
15.0000 mg | EXTENDED_RELEASE_TABLET | Freq: Two times a day (BID) | ORAL | Status: DC
  Administered 2014-06-04 – 2014-06-06 (×5): 15 mg via ORAL
  Filled 2014-06-04 (×5): qty 1

## 2014-06-04 NOTE — Progress Notes (Signed)
Subjective/Complaints: 68 y.o. male with history of COPD, alcohol abuse, macular degeneration, who was working on a house on 05/24/14 when he slipped and fell 6-8 feet off a ladder. He hit his right side on concrete with onset of right hip pain and right wrist deformity. Work up in ED revealed right distal radius fracture and right IT hip fracture. He was evaluated by Dr. Erlinda Hong and right wrist was manipulated and splinted in ED and he underwent IM nail of right hip fracture the same day. Post op WBAT on RLE and weight bearing through right elbow.  Has pain that slows him down when he gets up in the am Review of Systems - Negative except constipation and right wrist pain  Objective: Vital Signs: Blood pressure 102/55, pulse 66, temperature 98.1 F (36.7 C), temperature source Oral, resp. rate 18, height 5\' 8"  (1.727 m), weight 60.5 kg (133 lb 6.1 oz), SpO2 97 %. No results found. No results found for this or any previous visit (from the past 72 hour(s)).   HEENT: normal and poor dentition Cardio: RRR and no murmur Resp: CTA B/L and unlabored GI: BS positive and NT.ND Extremity:  Pulses positive and No Edema Skin:   Intact and Wound C/D/I Neuro: Alert/Oriented, Normal Sensory, Abnormal Motor 4/5 R delt, bi tri, 3- grip, pain inhibition, LUE 5/5 and Abnormal decreased FMC Musc/Skel:  Other pain with Active and passive ROM in RUE and RLE Gen NAD   Assessment/Plan: 1. Functional deficits secondary to Right distal radius fx, Right intertrochanteric fx after fall which require 3+ hours per day of interdisciplinary therapy in a comprehensive inpatient rehab setting. Physiatrist is providing close team supervision and 24 hour management of active medical problems listed below. Physiatrist and rehab team continue to assess barriers to discharge/monitor patient progress toward functional and medical goals. Marland KitchenFIM: FIM - Bathing Bathing Steps Patient Completed: Chest, Right Arm, Left Arm, Abdomen,  Front perineal area, Buttocks, Right upper leg, Left upper leg, Left lower leg (including foot) Bathing: 4: Min-Patient completes 8-9 28f 10 parts or 75+ percent  FIM - Upper Body Dressing/Undressing Upper body dressing/undressing steps patient completed: Thread/unthread right sleeve of pullover shirt/dresss, Thread/unthread left sleeve of pullover shirt/dress, Put head through opening of pull over shirt/dress, Pull shirt over trunk Upper body dressing/undressing: 5: Set-up assist to: Obtain clothing/put away FIM - Lower Body Dressing/Undressing Lower body dressing/undressing steps patient completed: Thread/unthread right pants leg, Thread/unthread left pants leg, Pull pants up/down Lower body dressing/undressing: 4: Steadying Assist  FIM - Toileting Toileting steps completed by patient: Adjust clothing prior to toileting, Performs perineal hygiene, Adjust clothing after toileting Toileting Assistive Devices: Grab bar or rail for support Toileting: 4: Steadying assist  FIM - Radio producer Devices: Grab bars, Insurance account manager Transfers: 4-To toilet/BSC: Min A (steadying Pt. > 75%), 4-From toilet/BSC: Min A (steadying Pt. > 75%)  FIM - Bed/Chair Transfer Bed/Chair Transfer Assistive Devices: Copy: 4: Supine > Sit: Min A (steadying Pt. > 75%/lift 1 leg), 4: Sit > Supine: Min A (steadying pt. > 75%/lift 1 leg)  FIM - Locomotion: Wheelchair Distance: 65 Locomotion: Wheelchair: 0: Activity did not occur FIM - Locomotion: Ambulation Locomotion: Ambulation Assistive Devices: Engineer, agricultural Ambulation/Gait Assistance: 5: Supervision Locomotion: Ambulation: 2: Travels 50 - 149 ft with supervision/safety issues  Comprehension Comprehension Mode: Auditory Comprehension: 6-Follows complex conversation/direction: With extra time/assistive device  Expression Expression Mode: Verbal Expression: 6-Expresses complex ideas: With extra time/assistive  device  Social Interaction Social Interaction:  7-Interacts appropriately with others - No medications needed.  Problem Solving Problem Solving: 6-Solves complex problems: With extra time  Memory Memory: 6-More than reasonable amt of time  Medical Problem List and Plan: 1. Functional deficits secondary to Right distal radius fx, Right intertrochanteric fx after fall 2.  DVT Prophylaxis/Anticoagulation: Pharmaceutical: Lovenox 3. Pain Managemen, Will increase  OxyContin to 15mg   for more consistent relief. . Continue robaxin prn for muscle spasms.    4. Mood: Team to provide ego support. LCSW to follow for evaluation and support.   5. Neuropsych: This patient is capable of making decisions on his own behalf. 6. Skin/Wound Care: Routine pressure relief measures. Monitor wound daily for healing. Elevation for edema control.   7. Fluids/Electrolytes/Nutrition:  Monitor I/O. Offer supplement as indicated if intake poor.   8. H/o seizures (alcohol related): On dilantin and has been seizure free for a year. Recheck dilantin level due to decrease in dosage.   9. ABLA: Continue to monitor H/H with routine checks. Continue iron supplement 10. COPD: Encourage IS. Continue Spiriva.   11. Frequency:  UA/neg . Urinary retention resolved.  12.  Low alb- likely ETOH related nutritional issues  Add supplements   13.  Constipation  Resolved, now too loose on senna S, change to colace LOS (Days) 6 A FACE TO FACE EVALUATION WAS PERFORMED  Ester Hilley E 06/04/2014, 6:37 AM

## 2014-06-04 NOTE — Plan of Care (Signed)
Problem: RH Wheelchair Mobility Goal: LTG Patient will propel w/c in controlled environment (PT) LTG: Patient will propel wheelchair in controlled environment, # of feet with assist (PT)  Outcome: Not Applicable Date Met:  43/60/67 Pt will not have w/c for use at home, and is ambulatory on unit, therefore will DC w/c goal at this time.

## 2014-06-04 NOTE — Progress Notes (Signed)
Physical Therapy Session Note  Patient Details  Name: Joshua Reed MRN: [id number] Date of Birth: Apr 06, 1946  Today's Date: 06/04/2014 PT Individual Time: 1300-1330 PT Individual Time Calculation (min): 30 min   Short Term Goals: Week 1:  PT Short Term Goal 1 (Week 1): =LTG's due ELOS  Skilled Therapeutic Interventions/Progress Updates:   Pt received lying in bed, agreeable to therapy session.  Daughter and wife present for continued family education and hands on training with stair negotiation and going through LE exercise program for better understanding.  Performed bed mobility with use of leg lifter at S level.  Min cues for bringing RLE out of bed further before setting leg lifter to the side.  Performed gait to and from therapy gym x 125' x 2 reps at S level.  Min cues for upright posture and increased hip/knee flex.  Demonstrated how to perform stairs and how daughter or wife will need to assist then had daughter return demonstration with pt.  Min cues for handling technique and safety.  Transferred to therapy mat to perform supine therex as follows; ankle pumps x 15 reps, heel slides x 10 reps, hip abd x 10 reps, and SAQs x 10 reps RLE with use of leg lifter.  Will finish exercise program during tomorrow's session.  Family assisted pt in ambulating back to room at S level.  RN aware.    Therapy Documentation Precautions:  Precautions Precautions: Fall Restrictions Weight Bearing Restrictions: Yes RUE Weight Bearing: Weight bear through elbow only RLE Weight Bearing: Weight bearing as tolerated Other Position/Activity Restrictions: Pt can weightbear through the right forearm.  Pain: Pain Assessment Pain Score: 2    See FIM for current functional status  Therapy/Group: Individual Therapy  Denice Bors 06/04/2014, 3:06 PM

## 2014-06-04 NOTE — Progress Notes (Signed)
Occupational Therapy Session Note  Patient Details  Name: Joshua Reed MRN: [id number] Date of Birth: [birth year]  Today's Date: 06/04/2014   First Session: OT Individual Time: 9038-3338 OT Individual Time Calculation (min): 45 min  Patient resting in bed upon approach and initially declined OT, stating he was fatigued from session earlier, but he concurred to wash (except for legs and feet) Edge of Bed and focused on sit to stand and dynamic standing andworking through c/o R UE and right hip pain.   Patient was reminded to work up to weight bearing through right hip when sitting unsupported to improve overall endurance and postural alighnment and safety   Second Session 1345-1415 (30 minutes) missed 15 minutes due to c/o extreme fatigue Focus of session was bed mobility, endurance bedside and positioning and icing of right hand and hip  Therapy Documentation Precautions:  Precautions Precautions: Fall Restrictions Weight Bearing Restrictions: Yes RUE Weight Bearing: Weight bear through elbow only RLE Weight Bearing: Weight bearing as tolerated Other Position/Activity Restrictions: Pt can weightbear through the right forearm.  Pain: constant pain and aching soreness in right wrist and hip but did not rate the pain level.   He stated RN had already given pain meds  See FIM for current functional status  Therapy/Group: Individual Therapy  Alfredia Ferguson [facility] 06/04/2014, 2:47 PM

## 2014-06-04 NOTE — Progress Notes (Signed)
Physical Therapy Session Note  Patient Details  Name: Joshua Reed MRN: [id number] Date of Birth: 1945-09-02  Today's Date: 06/04/2014 PT Individual Time: 0800-0900 PT Individual Time Calculation (min): 60 min   Short Term Goals: Week 1:  PT Short Term Goal 1 (Week 1): =LTG's due ELOS  Skilled Therapeutic Interventions/Progress Updates:   Pt received lying in bed, wife, [name] present for family training during session.  Performed bed mobility with S to EOB and donned shoes with use of back scratcher as shoe horn.  Performed all transfers at S level.  Educated wife on how to ambulate with pt, that he will not need physical assist during mobility, however would stay by his side in case of LOB to hold onto hips to correct balance.  Wife verbalized and returned demonstration during session.  Ambulated x 125' x 2 reps in controlled and home environment.  Performed furniture transfer in ADL apt at S level.  Discussed safety at home and how to ensure safe seating surfaces at home (stacking cushions or placement of thin plywood).  Both verbalized understanding.  Ambulated to therapy gym in order to simulate bed mobility from 30" height.  Pt unable to perform due to pain and weakness in R hip, therefore had pt step up to small 3" step which allowed him to do so.  Wife states that she does have a step at home, but is unsure of safety, therefore will have her bring in to assess during next session.  PT assisted pt with supine<>sit with use of leg lifter, but still requires min A for guiding of RLE.  Then had wife return demonstration.  Ended session with stair negotiation.  PT acted as pt and educated wife on where to assist and how to assist RW up to step.  Then had wife return demonstration with pt up/down 2, 6" steps.  Will practice this again in pm session.  Pt ambulated back to room and left in bed (wife assisted).  Okayed wife to assist pt with ambulation in room with RW.  RN aware.    Therapy  Documentation Precautions:  Precautions Precautions: Fall Restrictions Weight Bearing Restrictions: Yes RUE Weight Bearing: Weight bear through elbow only RLE Weight Bearing: Weight bearing as tolerated Other Position/Activity Restrictions: Pt can weightbear through the right forearm.  Pain: Pain Assessment Pain Assessment: No/denies pain   Locomotion : Ambulation Ambulation/Gait Assistance: 5: Supervision   See FIM for current functional status  Therapy/Group: Individual Therapy  Denice Bors 06/04/2014, 9:40 AM

## 2014-06-05 ENCOUNTER — Inpatient Hospital Stay (HOSPITAL_COMMUNITY): Payer: Medicare HMO | Admitting: Rehabilitation

## 2014-06-05 ENCOUNTER — Inpatient Hospital Stay (HOSPITAL_COMMUNITY): Payer: Worker's Compensation

## 2014-06-05 ENCOUNTER — Inpatient Hospital Stay (HOSPITAL_COMMUNITY): Payer: Medicare HMO | Admitting: Occupational Therapy

## 2014-06-05 LAB — PHENYTOIN LEVEL, TOTAL: PHENYTOIN LVL: 6.5 ug/mL — AB (ref 10.0–20.0)

## 2014-06-05 LAB — CREATININE, SERUM
Creatinine, Ser: 0.65 mg/dL (ref 0.50–1.35)
GFR calc non Af Amer: >90 mL/min (ref >90)

## 2014-06-05 MED ORDER — PHENYTOIN SODIUM EXTENDED 100 MG PO CAPS
200.0000 mg | ORAL_CAPSULE | Freq: Every day | ORAL | Status: DC
  Filled 2014-06-05: qty 2

## 2014-06-05 MED ORDER — PHENYTOIN SODIUM EXTENDED 100 MG PO CAPS
200.0000 mg | ORAL_CAPSULE | Freq: Two times a day (BID) | ORAL | Status: DC
  Administered 2014-06-05 – 2014-06-06 (×3): 200 mg via ORAL
  Filled 2014-06-05 (×5): qty 2

## 2014-06-05 MED ORDER — PHENYTOIN SODIUM EXTENDED 100 MG PO CAPS
300.0000 mg | ORAL_CAPSULE | Freq: Every day | ORAL | Status: DC
  Filled 2014-06-05 (×2): qty 3

## 2014-06-05 NOTE — Plan of Care (Signed)
Problem: RH Car Transfers Goal: LTG Patient will perform car transfers with assist (PT) LTG: Patient will perform car transfers with assistance (PT).  Outcome: Not Met (add Reason) Pt requires min A to elevate RLE into and out of care

## 2014-06-05 NOTE — Progress Notes (Signed)
Subjective/Complaints: 68 y.o. male with history of COPD, alcohol abuse, macular degeneration, who was working on a house on 05/24/14 when he slipped and fell 6-8 feet off a ladder. He hit his right side on concrete with onset of right hip pain and right wrist deformity. Work up in ED revealed right distal radius fracture and right IT hip fracture. He was evaluated by Dr. Erlinda Hong and right wrist was manipulated and splinted in ED and he underwent IM nail of right hip fracture the same day. Post op WBAT on RLE and weight bearing through right elbow.  Regular BMs Pain control OK Discussed home dose Dilantin Review of Systems - Negative except constipation and right wrist pain  Objective: Vital Signs: Blood pressure 110/58, pulse 69, temperature 98.6 F (37 C), temperature source Oral, resp. rate 18, height '5\' 8"'  (1.727 m), weight 60.6 kg (133 lb 9.6 oz), SpO2 96 %. No results found. Results for orders placed or performed during the hospital encounter of 05/29/14 (from the past 72 hour(s))  Creatinine, serum     Status: None   Collection Time: 06/05/14  4:05 AM  Result Value Ref Range   Creatinine, Ser 0.65 0.50 - 1.35 mg/dL   GFR calc non Af Amer >90 >90 mL/min   GFR calc Af Amer >90 >90 mL/min    Comment: (NOTE) The eGFR has been calculated using the CKD EPI equation. This calculation has not been validated in all clinical situations. eGFR's persistently <90 mL/min signify possible Chronic Kidney Disease.   Phenytoin level, total     Status: Abnormal   Collection Time: 06/05/14  4:05 AM  Result Value Ref Range   Phenytoin Lvl 6.5 (L) 10.0 - 20.0 ug/mL     HEENT: normal and poor dentition Cardio: RRR and no murmur Resp: CTA B/L and unlabored GI: BS positive and NT.ND Extremity:  Pulses positive and No Edema Skin:   Intact and Wound C/D/I Neuro: Alert/Oriented, Normal Sensory, Abnormal Motor 4/5 R delt, bi tri, 3- grip, pain inhibition, LUE 5/5 and Abnormal decreased FMC Musc/Skel:   Other pain with Active and passive ROM in RUE and RLE Gen NAD   Assessment/Plan: 1. Functional deficits secondary to Right distal radius fx, Right intertrochanteric fx after fall which require 3+ hours per day of interdisciplinary therapy in a comprehensive inpatient rehab setting. Physiatrist is providing close team supervision and 24 hour management of active medical problems listed below. Physiatrist and rehab team continue to assess barriers to discharge/monitor patient progress toward functional and medical goals. Marland KitchenFIM: FIM - Bathing Bathing Steps Patient Completed: Chest, Abdomen, Front perineal area, Buttocks (he elected not to wash feet or legs today) Bathing: 4: Min-Patient completes 8-9 64f10 parts or 75+ percent  FIM - Upper Body Dressing/Undressing Upper body dressing/undressing steps patient completed: Thread/unthread left sleeve of pullover shirt/dress, Pull shirt over trunk, Put head through opening of pull over shirt/dress, Thread/unthread right sleeve of pullover shirt/dresss Upper body dressing/undressing: 5: Set-up assist to: Obtain clothing/put away FIM - Lower Body Dressing/Undressing Lower body dressing/undressing steps patient completed: Thread/unthread left pants leg, Thread/unthread right pants leg, Pull pants up/down, Fasten/unfasten pants (elected not to change socks) Lower body dressing/undressing: 4: Min-Patient completed 75 plus % of tasks  FIM - Toileting Toileting steps completed by patient: Adjust clothing prior to toileting, Performs perineal hygiene, Adjust clothing after toileting Toileting Assistive Devices: Grab bar or rail for support Toileting: 4: Steadying assist  FIM - TRadio producerDevices: Grab bars, WInsurance account manager  Transfers: 0-Activity did not occur  FIM - Control and instrumentation engineer Devices: Copy: 4: Supine > Sit: Min A (steadying Pt. > 75%/lift 1 leg), 4: Sit > Supine:  Min A (steadying pt. > 75%/lift 1 leg), 5: Bed > Chair or W/C: Supervision (verbal cues/safety issues), 5: Chair or W/C > Bed: Supervision (verbal cues/safety issues)  FIM - Locomotion: Wheelchair Distance: 65 Locomotion: Wheelchair: 0: Activity did not occur FIM - Locomotion: Ambulation Locomotion: Ambulation Assistive Devices: Engineer, agricultural Ambulation/Gait Assistance: 5: Supervision Locomotion: Ambulation: 2: Travels 50 - 149 ft with supervision/safety issues  Comprehension Comprehension Mode: Auditory Comprehension: 6-Follows complex conversation/direction: With extra time/assistive device  Expression Expression Mode: Verbal Expression: 6-Expresses complex ideas: With extra time/assistive device  Social Interaction Social Interaction: 7-Interacts appropriately with others - No medications needed.  Problem Solving Problem Solving: 6-Solves complex problems: With extra time  Memory Memory: 6-More than reasonable amt of time  Medical Problem List and Plan: 1. Functional deficits secondary to Right distal radius fx, Right intertrochanteric fx after fall 2.  DVT Prophylaxis/Anticoagulation: Pharmaceutical: Lovenox 3. Pain Managemen, Will increase  OxyContin to 79m  for more consistent relief. . Continue robaxin prn for muscle spasms.    4. Mood: Team to provide ego support. LCSW to follow for evaluation and support.   5. Neuropsych: This patient is capable of making decisions on his own behalf. 6. Skin/Wound Care: Routine pressure relief measures. Monitor wound daily for healing. Elevation for edema control.   7. Fluids/Electrolytes/Nutrition:  Monitor I/O. Offer supplement as indicated if intake poor.   8. H/o seizures (alcohol related): On dilantin and has been seizure free for a year. Recheck dilantin level low will resume home dose   9. ABLA: Continue to monitor H/H with routine checks. Continue iron supplement 10. COPD: Encourage IS. Continue Spiriva.   11. Frequency:   UA/neg . Urinary retention resolved.  12.  Low alb- likely ETOH related nutritional issues  Add supplements   13.  Constipation  Resolved, now too loose on senna S, change to colace LOS (Days) 7 A FACE TO FACE EVALUATION WAS PERFORMED  KIRSTEINS,ANDREW E 06/05/2014, 6:41 AM

## 2014-06-05 NOTE — Progress Notes (Signed)
Physical Therapy Discharge Summary  Patient Details  Name: Joshua Reed MRN: [id number] Date of Birth: [birth year]  Today's Date: 06/05/2014 PT Individual Time: 1100-1200 and 1500-1545 PT Individual Time Calculation (min): 60 min and 45 mins   Patient has met 8 of 9 long term goals due to improved activity tolerance, improved balance, increased strength, decreased pain and ability to compensate for deficits.  Patient to discharge at an ambulatory level Supervision.   Patient's care partner is independent to provide the necessary cognitive assistance at discharge.  Reasons goals not met: Pt did not meet S level for car transfers due to needing min A for elevating and lowering RLE from car.    Recommendation:  Patient will benefit from ongoing skilled PT services in home health setting to continue to advance safe functional mobility, address ongoing impairments in decreased balance, decreased strength in RLE, decreased ROM in RLE, increased pain, and minimize fall risk.  Equipment: R PFRW  Reasons for discharge: treatment goals met and discharge from hospital  Patient/family agrees with progress made and goals achieved: Yes   PT Treatment/Intervention: AM session: Pt received lying in bed, agreeable to therapy session.  Wife and daughter arrived to participate in family training and education.  Pt ambulated >150' during session in controlled and home environment at S level with RPFRW with min cues for safety when turning and upright posture throughout.  Performed car transfer to simulated home vehicle height at min A level for elevating RLE into/out of car.  Educated wife and daughter on how to assist leg safely.  Wife verbalized understanding and returned demonstration.  Performed furniture transfer at S level with RW.  Progressed to therapy gym where PT elevated mat to 30" to simulate home with small step to step up to prior to sitting on bed (wife will have step for pm session).   Performed at S level with use of leg lifter and cues for safety.  Performed SLR RLE x 10 reps with leg lifter and light assist to prevent increased pain.  Educated wife on how to assist.  Then performed 2, 6" steps with RW with wife then with daughter at min A level for assisting RW.  Ended session with standing OTAGO HEP (see pt handout) x 10 reps each with cues on how to perform safely at home.  Pt ambulated back to room and was left in bed with family assisting.  All needs in reach.   PM session: Pt received lying in bed, agreeable to therapy session.  Performed bed mobility at S level with leg lifter.  Min cues to get RLE out of bed a little further before sitting up.  Skilled session focused on practicing bed mobility to simulated home bed height (31") with aerobic step that wife had purchased and brought in for session.  Pt ambulated to/from therapy gym x 110' x 2 reps with R PFRW at S level.  Min cues for upright posture and increasing step lengths.  Performed single step to get in "bed" at S level and was able to use leg lifter to get RLE up/down from mat at S level.  Ended session with seated nustep x 10 mins at level 4 resistance with LUE and BLEs for first 6 mins at slow pace for increased ROM and for last 4 mins maintaining upper 30's steps per min for increased strengthening and endurance.  Pt tolerated well.  Ambulated back to room and returned to bed.  Pt questioning whether he could get  up on his own to ambulate.  PT educated that he could get up if he wanted to but needed to have staff for S for safety.  Pt verbalized understanding.  Pt left in bed with all needs in reach.   PT Discharge Precautions/Restrictions Precautions Precautions: Fall Restrictions Weight Bearing Restrictions: Yes RUE Weight Bearing: Weight bear through elbow only RLE Weight Bearing: Weight bearing as tolerated Other Position/Activity Restrictions: Pt can weightbear through the right forearm.  Pain Pain  Assessment Pain Score: 1     Cognition Overall Cognitive Status: Within Functional Limits for tasks assessed Arousal/Alertness: Awake/alert Orientation Level: Oriented X4 Attention: Divided Alternating Attention: Appears intact Divided Attention: Appears intact Memory: Appears intact Awareness: Appears intact Problem Solving: Appears intact Safety/Judgment: Appears intact Comments: Pt did not demonstrate any unsafe behaviors during PT session.  Sensation Sensation Light Touch: Appears Intact Stereognosis: Not tested Hot/Cold: Not tested Proprioception: Appears Intact Coordination Gross Motor Movements are Fluid and Coordinated: Yes Fine Motor Movements are Fluid and Coordinated: No Coordination and Movement Description: Continues to have limitations in RUE due to short arm cast and precautions.  Heel Shin Test: unable to perform due to pain and decreased ROM.  Motor  Motor Motor - Discharge Observations: Pt continues to have pain limiting ROM and mobility, but is overall at S to mod I level.   Mobility Bed Mobility Bed Mobility: Supine to Sit;Sit to Supine Supine to Sit: 5: Supervision Supine to Sit Details: Verbal cues for technique;Verbal cues for sequencing;Verbal cues for precautions/safety Sit to Supine: 5: Supervision Sit to Supine - Details: Verbal cues for sequencing;Verbal cues for technique;Verbal cues for precautions/safety Transfers Transfers: Yes Sit to Stand: 5: Supervision Sit to Stand Details: Verbal cues for sequencing;Verbal cues for technique;Verbal cues for precautions/safety Stand to Sit: 5: Supervision Stand to Sit Details (indicate cue type and reason): Verbal cues for precautions/safety Stand Pivot Transfers: 5: Supervision Stand Pivot Transfer Details: Verbal cues for precautions/safety Locomotion  Ambulation Ambulation: Yes Ambulation/Gait Assistance: 5: Supervision Ambulation Distance (Feet): 125 Feet Assistive device: Right platform  walker Ambulation/Gait Assistance Details: Verbal cues for precautions/safety;Verbal cues for gait pattern Gait Gait: Yes Gait Pattern: Impaired Gait Pattern: Antalgic;Trunk flexed;Decreased weight shift to right;Decreased hip/knee flexion - right;Step-to pattern;Step-through pattern Stairs / Additional Locomotion Stairs: Yes Stairs Assistance: 4: Min assist (for assisting RW) Stairs Assistance Details: Verbal cues for precautions/safety Stair Management Technique: No rails;With walker;Backwards;Step to pattern Number of Stairs: 2 Height of Stairs: 6 Wheelchair Mobility Wheelchair Mobility: No (pt ambulatory at DC)  Trunk/Postural Assessment  Cervical Assessment Cervical Assessment: Within Functional Limits Thoracic Assessment Thoracic Assessment: Within Functional Limits Lumbar Assessment Lumbar Assessment: Exceptions to [facility] Lumbar Strength Overall Lumbar Strength Comments: Pt with posterior pelvic tilt in sitting and standing, also keeps forward posture during standing secondary to increased pain Postural Control Postural Control: Deficits on evaluation Postural Limitations: Continues to keep trunk leaned to the L and increased WB through L hip in sitting and standing due to pain in R hip.  Continue to encourage sitting and standing weight shifts.   Balance Balance Balance Assessed: Yes Static Sitting Balance Static Sitting - Balance Support: No upper extremity supported;Feet supported Static Sitting - Level of Assistance: 6: Modified independent (Device/Increase time) Dynamic Sitting Balance Dynamic Sitting - Balance Support: Feet supported;No upper extremity supported Dynamic Sitting - Level of Assistance: 6: Modified independent (Device/Increase time) Static Standing Balance Static Standing - Balance Support: During functional activity;Bilateral upper extremity supported Static Standing - Level of Assistance: 5: Stand  by assistance Dynamic Standing Balance Dynamic  Standing - Balance Support: During functional activity;Bilateral upper extremity supported Dynamic Standing - Level of Assistance: 5: Stand by assistance Extremity Assessment      RLE Assessment RLE Assessment: Exceptions to [facility] RLE Strength RLE Overall Strength: Deficits;Due to pain RLE Overall Strength Comments: hip flex 2-/5, knee ext 2/5, knee flex 2/5, ankle motions 5/5 LLE Assessment LLE Assessment: Within Functional Limits  See FIM for current functional status  Denice Bors 06/05/2014, 12:40 PM

## 2014-06-05 NOTE — Plan of Care (Signed)
Problem: RH BOWEL ELIMINATION Goal: RH STG MANAGE BOWEL WITH ASSISTANCE STG Manage Bowel with Assistance. Mod I  Outcome: Completed/Met Date Met:  06/05/14 Goal: RH STG MANAGE BOWEL W/MEDICATION W/ASSISTANCE STG Manage Bowel with Medication with Assistance. Mod I  Outcome: Completed/Met Date Met:  06/05/14  Problem: RH BLADDER ELIMINATION Goal: RH STG MANAGE BLADDER WITH ASSISTANCE STG Manage Bladder -Mod I  Outcome: Completed/Met Date Met:  06/05/14  Problem: RH SKIN INTEGRITY Goal: RH STG SKIN FREE OF INFECTION/BREAKDOWN Remain free from infection and breakdown while on rehab with min assist. Outcome: Completed/Met Date Met:  06/05/14 Goal: RH STG MAINTAIN SKIN INTEGRITY WITH ASSISTANCE STG Maintain Skin Integrity With [place].  Outcome: Completed/Met Date Met:  06/05/14 Goal: RH STG ABLE TO PERFORM INCISION/WOUND CARE W/ASSISTANCE STG Able To Perform Incision/Wound Care With [employer].  Outcome: Completed/Met Date Met:  06/05/14  Problem: RH PAIN MANAGEMENT Goal: RH STG PAIN MANAGED AT OR BELOW PT'S PAIN GOAL Pain will be less than 3 on 1/10 scale  Outcome: Completed/Met Date Met:  06/05/14

## 2014-06-05 NOTE — Plan of Care (Signed)
Problem: RH Balance Goal: LTG: Patient will maintain dynamic sitting balance (OT) LTG: Patient will maintain dynamic sitting balance with assistance during activities of daily living (OT)  Outcome: Completed/Met Date Met:  06/05/14 Goal: LTG Patient will maintain dynamic standing with ADLs (OT) LTG: Patient will maintain dynamic standing balance with assist during activities of daily living (OT)  Outcome: Completed/Met Date Met:  06/05/14  Problem: RH Grooming Goal: LTG Patient will perform grooming w/assist,cues/equip (OT) LTG: Patient will perform grooming with assist, with/without cues using equipment (OT)  Outcome: Completed/Met Date Met:  06/05/14  Problem: RH Bathing Goal: LTG Patient will bathe with assist, cues/equipment (OT) LTG: Patient will bathe specified number of body parts with assist with/without cues using equipment (position) (OT)  Outcome: Adequate for Discharge  Problem: RH Dressing Goal: LTG Patient will perform upper body dressing (OT) LTG Patient will perform upper body dressing with assist, with/without cues (OT).  Outcome: Completed/Met Date Met:  06/05/14 Goal: LTG Patient will perform lower body dressing w/assist (OT) LTG: Patient will perform lower body dressing with assist, with/without cues in positioning using equipment (OT)  Outcome: Completed/Met Date Met:  06/05/14  Problem: RH Toileting Goal: LTG Patient will perform toileting w/assist, cues/equip (OT) LTG: Patient will perform toiletiing (clothes management/hygiene) with assist, with/without cues using equipment (OT)  Outcome: Completed/Met Date Met:  06/05/14  Problem: RH Toilet Transfers Goal: LTG Patient will perform toilet transfers w/assist (OT) LTG: Patient will perform toilet transfers with assist, with/without cues using equipment (OT)  Outcome: Completed/Met Date Met:  06/05/14

## 2014-06-05 NOTE — Plan of Care (Signed)
Problem: RH Balance Goal: LTG Patient will maintain dynamic standing balance (PT) LTG: Patient will maintain dynamic standing balance with assistance during mobility activities (PT)  Outcome: Completed/Met Date Met:  06/05/14  Problem: RH Bed Mobility Goal: LTG Patient will perform bed mobility with assist (PT) LTG: Patient will perform bed mobility with assistance, with/without cues (PT).  Outcome: Completed/Met Date Met:  06/05/14  Problem: RH Bed to Chair Transfers Goal: LTG Patient will perform bed/chair transfers w/assist (PT) LTG: Patient will perform bed/chair transfers with assistance, with/without cues (PT).  Outcome: Completed/Met Date Met:  06/05/14  Problem: RH Car Transfers Goal: LTG Patient will perform car transfers with assist (PT) LTG: Patient will perform car transfers with assistance (PT).  Outcome: Completed/Met Date Met:  06/05/14  Problem: RH Furniture Transfers Goal: LTG Patient will perform furniture transfers w/assist (OT/PT LTG: Patient will perform furniture transfers with assistance (OT/PT).  Outcome: Completed/Met Date Met:  06/05/14  Problem: RH Ambulation Goal: LTG Patient will ambulate in controlled environment (PT) LTG: Patient will ambulate in a controlled environment, # of feet with assistance (PT).  Outcome: Completed/Met Date Met:  06/05/14 Goal: LTG Patient will ambulate in home environment (PT) LTG: Patient will ambulate in home environment, # of feet with assistance (PT).  Outcome: Completed/Met Date Met:  06/05/14  Problem: RH Stairs Goal: LTG Patient will ambulate up and down stairs w/assist (PT) LTG: Patient will ambulate up and down # of stairs with assistance (PT)  Outcome: Completed/Met Date Met:  06/05/14

## 2014-06-05 NOTE — Plan of Care (Signed)
Problem: RH PAIN MANAGEMENT Goal: RH STG PAIN MANAGED AT OR BELOW PT'S PAIN GOAL Pain will be less than 3 on 1/10 scale  Outcome: Progressing

## 2014-06-05 NOTE — Progress Notes (Signed)
Occupational Therapy Session Note  Patient Details  Name: Joshua Reed MRN: [id number] Date of Birth: Dec 04, 1945  Today's Date: 06/05/2014 OT Individual Time: 0815-0900 OT Individual Time Calculation (min): 45 min    Short Term Goals: Week 1:  OT Short Term Goal 1 (Week 1): STGs = to LTGs set at supervision to min assist based on LOS.  Skilled Therapeutic Interventions/Progress Updates: ADL-retraining with focus on pain management during BADL, AE re-training, and home safety needs re-ed.   Pt received sitting at edge of bed and reporting need for assist with managing right leg during bed mobility prior to treatment this date d/t pain.   Pt was educated on treatment goals and methods for "graduation day" activities with qualification that all BADL should be completed in 45 min time allotted.    Pt agreed to perform tasks as he was able while self-limited by pain and requiring extra time although reporting planned adaptation with recruiting his wife as helper during BADL s/p d/c.   Pt rose from EOB to platform walker, ambulated to sink and performed bathing/dressing at sink side, sitting and standing with only standby assist for safety.    OT demo'd use of LH sponge for bathing legs as pt elects to request assist with washing and drying his feet d/t poor pain tolerance.  Pt verbalized competence with use of AE but states he will likely have his wife help with lower body bathing/dressing d/t poor pain tolerance.  Pt reviewed home safety needs and declared need for [facility] to use as safety frame over his toilet.   Pt left in w/c with reacher close by and all needs within reach.            Therapy Documentation Precautions:  Precautions Precautions: Fall Restrictions Weight Bearing Restrictions: Yes RUE Weight Bearing: Weight bear through elbow only RLE Weight Bearing: Weight bearing as tolerated Other Position/Activity Restrictions: Pt can weightbear through the right forearm.  Vital  Signs: Therapy Vitals Temp: 98.6 F (37 C) Temp Source: Oral Pulse Rate: 69 Resp: 18 BP: (!) 110/58 mmHg Patient Position (if appropriate): Lying Oxygen Therapy SpO2: 96 % O2 Device: Not Delivered   Pain: Pain Assessment Pain Assessment: 0-10 Pain Score: 8  Pain Type: Acute pain Pain Location: Hip Pain Orientation: Right Pain Descriptors / Indicators: Aching Pain Frequency: Constant Pain Onset: With Activity Patients Stated Pain Goal: 3 Pain Intervention(s): Repositioned;Relaxation Multiple Pain Sites: No  See FIM for current functional status  Therapy/Group: Individual Therapy  [place] 06/05/2014, 9:22 AM

## 2014-06-05 NOTE — Progress Notes (Signed)
Social Work Patient ID: Joshua Reed, male   DOB: 09-01-1945, 68 y.o.   MRN: [id number] Met with pt and wife who is here for education session, have not heard about platform walker or other pieces of equipment being delivered.  The walker is suppose to come to pt's room and the rest delivered to his home. Have left message for Workers [employer] case manager regarding these questions.  Will await return call.

## 2014-06-05 NOTE — Progress Notes (Signed)
Occupational Therapy Session Note  Patient Details  Name: Joshua Reed MRN: [id number] Date of Birth: 1945-08-30  Today's Date: 06/05/2014 OT Individual Time: 1330-1400 OT Calculated Individual Time (min): 30 min  Short Term Goals: Week 1:  OT Short Term Goal 1 (Week 1): STGs = to LTGs set at supervision to min assist based on LOS.  Skilled Therapeutic Interventions/Progress Updates:  Skilled OT session focusing on bed mobility, pt and family education, and d/c planning. Pt supine in bed when arriving, reporting 5/10 pain in R hip. Pt wife arriving shortly after beginning session. Discussed d/c planning, DME needs for home, and importance of movement out of bed when at home to prevent pneumonia, bed sores, and weakness. Pt practiced transfer to EOB, then sit<>stand with platform walker, and then transferred back to supine. Wife educated on proper body mechanics for protecting her back when assisting pt with transfers. Pt supine in bed with all needs nearby and RN administering medication when leaving.  Therapy Documentation Precautions:  Precautions Precautions: Fall Restrictions Weight Bearing Restrictions: Yes RUE Weight Bearing: Weight bear through elbow only RLE Weight Bearing: Weight bearing as tolerated Other Position/Activity Restrictions: Pt can weightbear through the right forearm.  See FIM for current functional status  Therapy/Group: Individual Therapy  Jaquayla Hege Raynell 06/05/2014, 12:03 PM

## 2014-06-05 NOTE — Plan of Care (Signed)
Problem: RH BOWEL ELIMINATION Goal: RH STG MANAGE BOWEL WITH ASSISTANCE STG Manage Bowel with Assistance. Mod I  Outcome: Progressing Goal: RH STG MANAGE BOWEL W/MEDICATION W/ASSISTANCE STG Manage Bowel with Medication with Assistance. Mod I  Outcome: Progressing  Problem: RH BLADDER ELIMINATION Goal: RH STG MANAGE BLADDER WITH ASSISTANCE STG Manage Bladder -Mod I  Outcome: Progressing  Problem: RH SKIN INTEGRITY Goal: RH STG SKIN FREE OF INFECTION/BREAKDOWN Outcome: Progressing Goal: RH STG MAINTAIN SKIN INTEGRITY WITH ASSISTANCE STG Maintain Skin Integrity With [place].  Outcome: Progressing Goal: RH STG ABLE TO PERFORM INCISION/WOUND CARE W/ASSISTANCE STG Able To Perform Incision/Wound Care With [employer].  Outcome: Progressing  Problem: RH PAIN MANAGEMENT Goal: RH STG PAIN MANAGED AT OR BELOW PT'S PAIN GOAL Pain will be less than 3 on 1/10 scale  Outcome: Progressing

## 2014-06-05 NOTE — Progress Notes (Signed)
Occupational Therapy Discharge Summary  Patient Details  Name: OSTEN JANEK MRN: [id number] Date of Birth: [birth year]  Today's Date: 06/05/2014  Patient has met 7 of 8 long term goals due to improved activity tolerance, improved balance, ability to compensate for deficits and improved coordination.  Patient to discharge at overall Supervision to min A level.  Patient's care partner is independent to provide the necessary physical assistance at discharge.    Reasons goals not met: 1/8 goals, bathing, is adequate for discharge. Pt requiring min A for LB bathing and goal set at supervision. Pt not wishing to use AE for LB bathing at this time, however pt wife able to assist with this.  Recommendation:  No further skilled OT services recommended at this time.  Equipment: 3-in-1 commode  Reasons for discharge: Discharge from hospital  Patient/family agrees with progress made and goals achieved: Yes  OT Discharge Precautions/Restrictions  Precautions Precautions: Fall Restrictions Weight Bearing Restrictions: Yes RUE Weight Bearing: Weight bear through elbow only RLE Weight Bearing: Weight bearing as tolerated Other Position/Activity Restrictions: Pt can weightbear through the right forearm.   General Chart Reviewed: Yes Family/Caregiver Present: Yes   Vital Signs Therapy Vitals Temp: 98.6 F (37 C) Temp Source: Oral Pulse Rate: 75 Resp: 18 BP: (!) 102/58 mmHg Patient Position (if appropriate): Lying Oxygen Therapy SpO2: 96 % O2 Device: Not Delivered  ADL Overall Supervision except min A for LB dressing  Vision/Perception  Vision- History Baseline Vision/History: Wears glasses Wears Glasses: Reading only Patient Visual Report: No change from baseline   Cognition Overall Cognitive Status: Within Functional Limits for tasks assessed Arousal/Alertness: Awake/alert Orientation Level: Oriented X4 Safety/Judgment: Appears intact   Sensation Sensation Light  Touch: Appears Intact Coordination Gross Motor Movements are Fluid and Coordinated: Yes Fine Motor Movements are Fluid and Coordinated: No Coordination and Movement Description: Continues to have limitations in RUE due to short arm cast and precautions.    Motor  Motor Motor: Abnormal postural alignment and control Motor - Discharge Observations: Pain is a limiting factor for mobility   Mobility  Bed Mobility Bed Mobility: Supine to Sit;Sit to Supine Supine to Sit: 5: Supervision Sit to Supine: 5: Supervision Transfers Transfers: Sit to Stand;Stand to Sit Sit to Stand: 5: Supervision Stand to Sit: 5: Supervision   Trunk/Postural Assessment (per PT note) Cervical Assessment Cervical Assessment: Within Functional Limits Thoracic Assessment Thoracic Assessment: Within Functional Limits Lumbar Assessment Lumbar Assessment: Exceptions to [facility] Lumbar Strength Overall Lumbar Strength Comments: Pt with posterior pelvic tilt in sitting and standing, also keeps forward posture during standing secondary to increased pain Postural Control Postural Control: Deficits on evaluation Postural Limitations: Continues to keep trunk leaned to the L and increased WB through L hip in sitting and standing due to pain in R hip. Continue to encourage sitting and standing weight shifts.    Balance Balance Balance Assessed: Per PT note Static Sitting Balance Static Sitting - Balance Support: No upper extremity supported;Feet supported Static Sitting - Level of Assistance: 6: Modified independent (Device/Increase time) Dynamic Sitting Balance Dynamic Sitting - Balance Support: Feet supported;No upper extremity supported Dynamic Sitting - Level of Assistance: 6: Modified independent (Device/Increase time) Static Standing Balance Static Standing - Balance Support: During functional activity;Bilateral upper extremity supported Static Standing - Level of Assistance: 5: Stand by assistance Dynamic  Standing Balance Dynamic Standing - Balance Support: During functional activity;Bilateral upper extremity supported Dynamic Standing - Level of Assistance: 5: Stand by assistance  Extremity/Trunk Assessment RUE Assessment RUE Assessment:  Exceptions to [facility] RUE Strength RUE Overall Strength Comments: Limitation in opposition of thumb and small finger, most likely due to cast. LUE Assessment LUE Assessment: Within Functional Limits  See FIM for current functional status  Raiford Fetterman Raynell 06/05/2014, 2:57 PM

## 2014-06-06 DIAGNOSIS — F1010 Alcohol abuse, uncomplicated: Secondary | ICD-10-CM

## 2014-06-06 DIAGNOSIS — S52501S Unspecified fracture of the lower end of right radius, sequela: Secondary | ICD-10-CM

## 2014-06-06 DIAGNOSIS — W11XXXS Fall on and from ladder, sequela: Secondary | ICD-10-CM

## 2014-06-06 DIAGNOSIS — S72141D Displaced intertrochanteric fracture of right femur, subsequent encounter for closed fracture with routine healing: Secondary | ICD-10-CM

## 2014-06-06 LAB — CLOSTRIDIUM DIFFICILE BY PCR: CDIFFPCR: NEGATIVE

## 2014-06-06 MED ORDER — BENEPROTEIN PO POWD: 6.0000 g | Freq: Three times a day (TID) | ORAL | Status: DC

## 2014-06-06 MED ORDER — FAMOTIDINE 10 MG PO TABS: 10.0000 mg | ORAL_TABLET | Freq: Every day | ORAL | Status: DC

## 2014-06-06 MED ORDER — ACETAMINOPHEN 325 MG PO TABS: 325.0000 mg | ORAL_TABLET | ORAL | Status: DC | PRN

## 2014-06-06 MED ORDER — PHENYTOIN SODIUM EXTENDED 200 MG PO CAPS: 200.0000 mg | ORAL_CAPSULE | Freq: Two times a day (BID) | ORAL | Status: DC

## 2014-06-06 MED ORDER — OXYCODONE HCL 10 MG PO TABS: 10.0000 mg | ORAL_TABLET | Freq: Four times a day (QID) | ORAL | Status: DC | PRN

## 2014-06-06 MED ORDER — TIOTROPIUM BROMIDE MONOHYDRATE 18 MCG IN CAPS: 18.0000 ug | ORAL_CAPSULE | Freq: Every day | RESPIRATORY_TRACT | Status: DC

## 2014-06-06 MED ORDER — NICOTINE 21 MG/24HR TD PT24: 21.0000 mg | MEDICATED_PATCH | Freq: Every day | TRANSDERMAL | Status: DC

## 2014-06-06 MED ORDER — METHOCARBAMOL 500 MG PO TABS: 500.0000 mg | ORAL_TABLET | Freq: Three times a day (TID) | ORAL | Status: DC | PRN

## 2014-06-06 MED ORDER — OXYCODONE HCL ER 15 MG PO T12A: 15.0000 mg | EXTENDED_RELEASE_TABLET | Freq: Two times a day (BID) | ORAL | Status: DC

## 2014-06-06 MED ORDER — FERROUS SULFATE 325 (65 FE) MG PO TABS: 325.0000 mg | ORAL_TABLET | Freq: Two times a day (BID) | ORAL | Status: DC

## 2014-06-06 NOTE — Progress Notes (Signed)
Social Work Patient ID: Joshua Reed, male   DOB: 07/02/1946, 68 y.o.   MRN: [id number] Pt's equipment delivered last night.  Still unsure who will be providing the home health follow up, workers comp Tourist information centre manager working on this. She will contact pt and wife when set up.

## 2014-06-06 NOTE — Progress Notes (Signed)
Social Work Discharge Note Discharge Note  The overall goal for the admission was met for:   Discharge location: Yes-HOME WITH WIFE WHO CAN PROVIDE SUPERVISION LEVEL  Length of Stay: Yes-8 DAYS  Discharge activity level: Yes-SUPERVISION LEVEL  Home/community participation: Yes  Services provided included: MD, RD, PT, OT, RN, CM, TR, Pharmacy and SW  Financial Services: Worker's Comp  Follow-up services arranged: Home Health: WORKERS COMP ARRANGING-PT, OT,RN and DME: WORKERS COMP-BSC, R-PLATFORM WALKER,TUB BENCH  Comments (or additional information):WIFE HAS BEEN THROUGH FAMILY EDUCATION, STILL SOMEWHAT ANXIOUS ABOUT PT COMING HOME, CONCERNED ABOUT HIM BEING ABLE TO MOVE AROUND THEIR HOME, FULL OF FURNITURE AND CLUTTERED.  PT IS NOT AS CONCERNED AS WIFE.  Patient/Family verbalized understanding of follow-up arrangements: Yes  Individual responsible for coordination of the follow-up plan: SUSAN-WIFE & PT  Confirmed correct DME delivered: Elease Hashimoto 06/06/2014    Elease Hashimoto

## 2014-06-06 NOTE — Progress Notes (Signed)
Pt. Got d/c papers, instructions and follow up appointments as well.Pt. Ready to go home with his wife.

## 2014-06-06 NOTE — Progress Notes (Signed)
Subjective/Complaints: 68 y.o. male with history of COPD, alcohol abuse, macular degeneration, who was working on a house on 05/24/14 when he slipped and fell 6-8 feet off a ladder. He hit his right side on concrete with onset of right hip pain and right wrist deformity. Work up in ED revealed right distal radius fracture and right IT hip fracture. He was evaluated by Dr. Erlinda Hong and right wrist was manipulated and splinted in ED and he underwent IM nail of right hip fracture the same day. Post op WBAT on RLE and weight bearing through right elbow.  Occ wrist pain, no new issues No BM yet this am Discussed low alb effect on DIlantin levels Review of Systems - Negative except constipation and right wrist pain  Objective: Vital Signs: Blood pressure 110/51, pulse 73, temperature 98.6 F (37 C), temperature source Oral, resp. rate 18, height '5\' 8"'  (1.727 m), weight 60.6 kg (133 lb 9.6 oz), SpO2 97 %. No results found. Results for orders placed or performed during the hospital encounter of 05/29/14 (from the past 72 hour(s))  Creatinine, serum     Status: None   Collection Time: 06/05/14  4:05 AM  Result Value Ref Range   Creatinine, Ser 0.65 0.50 - 1.35 mg/dL   GFR calc non Af Amer >90 >90 mL/min   GFR calc Af Amer >90 >90 mL/min    Comment: (NOTE) The eGFR has been calculated using the CKD EPI equation. This calculation has not been validated in all clinical situations. eGFR's persistently <90 mL/min signify possible Chronic Kidney Disease.   Phenytoin level, total     Status: Abnormal   Collection Time: 06/05/14  4:05 AM  Result Value Ref Range   Phenytoin Lvl 6.5 (L) 10.0 - 20.0 ug/mL     HEENT: normal and poor dentition Cardio: RRR and no murmur Resp: CTA B/L and unlabored GI: BS positive and NT.ND Extremity:  Pulses positive and No Edema Skin:   Intact and Wound C/D/I Neuro: Alert/Oriented, Normal Sensory, Abnormal Motor 4/5 R delt, bi tri, 3- grip, pain inhibition, LUE 5/5 and  Abnormal decreased FMC Musc/Skel:  Other pain with Active and passive ROM in RUE and RLE Gen NAD   Assessment/Plan: 1. Functional deficits secondary to Right distal radius fx, Right intertrochanteric fx after fall which require 3+ hours per day of interdisciplinary therapy in a comprehensive inpatient rehab setting. Physiatrist is providing close team supervision and 24 hour management of active medical problems listed below. Physiatrist and rehab team continue to assess barriers to discharge/monitor patient progress toward functional and medical goals. Marland KitchenFIM: FIM - Bathing Bathing Steps Patient Completed: Chest, Right Arm, Left Arm, Abdomen, Front perineal area, Buttocks, Right upper leg, Left upper leg Bathing: 4: Min-Patient completes 8-9 61f10 parts or 75+ percent  FIM - Upper Body Dressing/Undressing Upper body dressing/undressing steps patient completed: Thread/unthread right sleeve of pullover shirt/dresss, Thread/unthread left sleeve of pullover shirt/dress, Put head through opening of pull over shirt/dress, Pull shirt over trunk Upper body dressing/undressing: 5: Set-up assist to: Obtain clothing/put away FIM - Lower Body Dressing/Undressing Lower body dressing/undressing steps patient completed: Thread/unthread left pants leg, Pull pants up/down, Don/Doff right shoe, Don/Doff left shoe Lower body dressing/undressing: 4: Min-Patient completed 75 plus % of tasks  FIM - Toileting Toileting steps completed by patient: Adjust clothing prior to toileting, Performs perineal hygiene, Adjust clothing after toileting Toileting Assistive Devices: Grab bar or rail for support Toileting: 6: More than reasonable amount of time  FIM - Toilet  Transfers Chief of Staff Devices: Engineer, civil (consulting), Insurance account manager Transfers: 5-To toilet/BSC: Supervision (verbal cues/safety issues), 5-From toilet/BSC: Supervision (verbal cues/safety issues)  FIM - Buyer, retail Devices: Bed rails, Adult nurse Transfer: 5: Supine > Sit: Supervision (verbal cues/safety issues), 5: Sit > Supine: Supervision (verbal cues/safety issues), 5: Chair or W/C > Bed: Supervision (verbal cues/safety issues), 5: Bed > Chair or W/C: Supervision (verbal cues/safety issues)  FIM - Locomotion: Wheelchair Distance: 65 Locomotion: Wheelchair: 0: Activity did not occur (pt ambulatory at D/C) FIM - Locomotion: Ambulation Locomotion: Ambulation Assistive Devices: Engineer, agricultural Ambulation/Gait Assistance: 5: Supervision Locomotion: Ambulation: 5: Travels 150 ft or more with supervision/safety issues  Comprehension Comprehension Mode: Auditory Comprehension: 6-Follows complex conversation/direction: With extra time/assistive device  Expression Expression Mode: Verbal Expression: 6-Expresses complex ideas: With extra time/assistive device  Social Interaction Social Interaction: 7-Interacts appropriately with others - No medications needed.  Problem Solving Problem Solving: 6-Solves complex problems: With extra time  Memory Memory: 5-Requires cues to use assistive device  Medical Problem List and Plan: 1. Functional deficits secondary to Right distal radius fx, Right intertrochanteric fx after fall 2.  DVT Prophylaxis/Anticoagulation: Pharmaceutical: Lovenox 3. Pain Managemen, Will increase  OxyContin to 47m  for more consistent relief. . Continue robaxin prn for muscle spasms.    4. Mood: Team to provide ego support. LCSW to follow for evaluation and support.   5. Neuropsych: This patient is capable of making decisions on his own behalf. 6. Skin/Wound Care: Routine pressure relief measures. Monitor wound daily for healing. Elevation for edema control.   7. Fluids/Electrolytes/Nutrition:  Monitor I/O. Offer supplement as indicated if intake poor.   8. H/o seizures (alcohol related): On dilantin and has been seizure free for a year. Recheck dilantin level low  will resume home dose   9. ABLA: Continue to monitor H/H with routine checks. Continue iron supplement 10. COPD: Encourage IS. Continue Spiriva.   11. Frequency:  UA/neg . Urinary retention resolved.  12.  Low alb- likely ETOH related nutritional issues  Add supplements, this is causing falsely lowered DPH levels 13.  Constipation  Resolved, now too loose on senna S, change to colace LOS (Days) 8 A FACE TO FACE EVALUATION WAS PERFORMED  Joshua Reed E 06/06/2014, 6:54 AM

## 2014-06-06 NOTE — Progress Notes (Signed)
Staples were removed,pt. Tolerated fine.Steri strips were place on.

## 2014-06-06 NOTE — Discharge Summary (Signed)
Physician Discharge Summary  Patient ID: Joshua Reed MRN: [id number] DOB/AGE: 1946-03-22 68 y.o.  Admit date: 05/29/2014 Discharge date: 06/06/2014  Discharge Diagnoses:  Principal Problem:   Fall from ladder Active Problems:   Alcohol abuse   Trauma   Distal radius fracture, right   Intertrochanteric fracture of right hip   Discharged Condition: Stable.    Labs:  Basic Metabolic Panel:    Component Value Date/Time   NA 136* 05/30/2014 0449   K 4.6 05/30/2014 0449   CL 101 05/30/2014 0449   CO2 26 05/30/2014 0449   GLUCOSE 92 05/30/2014 0449   BUN 13 05/30/2014 0449   CREATININE 0.65 06/05/2014 0405   CALCIUM 8.2* 05/30/2014 0449   GFRNONAA >90 06/05/2014 0405   GFRAA >90 06/05/2014 0405     CBC: CBC Latest Ref Rng 05/30/2014 05/29/2014 05/29/2014  WBC 4.0 - 10.5 K/uL 5.7 5.9 4.8  Hemoglobin 13.0 - 17.0 g/dL 9.3(L) 9.7(L) 7.3(L)  Hematocrit 39.0 - 52.0 % 28.3(L) 29.2(L) 22.1(L)  Platelets 150 - 400 K/uL 251 245 220     CBG: No results for input(s): GLUCAP in the last 168 hours.  Brief HPI:   Joshua Reed is a 68 y.o. male with history of COPD, alcohol abuse, macular degeneration, who was working on a house on 05/24/14 when he slipped and fell 6-8 feet off a ladder. He hit his right side on concrete with onset of right hip pain and right wrist deformity. Work up in ED revealed right distal radius fracture and right IT hip fracture. He was evaluated by Dr. Erlinda Hong and right wrist was manipulated and splinted in ED and he underwent IM nail of right hip fracture the same day. Post op WBAT on RLE and weight bearing through right elbow and splint readjusted due to complaints of pain.  He has received 3 units PRBC total  for ABLA. Therapy initiated and CIR recommended for follow up therapy.    Hospital Course: Joshua Reed was admitted to rehab 05/29/2014 for inpatient therapies to consist of PT and OT at least three hours five days a week. Past admission physiatrist,  therapy team and rehab RN have worked together to provide customized collaborative inpatient rehab. Acute blood loss anemia has been stable and he is to continue on iron supplements past discharge. Protein supplements were added to help with low calorie malnutrition due to low albumin at 2.2. He has been seizure free on dilantin 200 mg bid and repeat dilantin level is at 6.6 which corrects to therapeutic levels.  Right hip incision has been healing well and staples were removed without difficulty at discharge. RUE splint remains in place with decrease in edema right fingers. He has been advised on elevation to help with edema control. Oxycontin was added to help with more consistent pain relief. He was instructed on taper past discharge. He has made steady progress during his rehab stay and is at min assist to supervision level.  Worker comp to schedule follow up [place] as well as DME. He was discharged to home in improved condition.    Rehab course: During patient's stay in rehab weekly team conferences were held to monitor patient's progress, set goals and discuss barriers to discharge.  Patient has had improvement in activity tolerance, balance, postural control, as well as ability to compensate for deficits. He is has had improvement in functional use RUE  and RLE as well as improved awareness. He requires min assist for bathing, set up  assist with upper body care, min assist with lower body dressing. He is able to transfer with supervision and ambulate > 150 feet with supervision and R-PFRW. He requires leg lifter to raise RLE off bed as well as min assist with RLE for car transfers. Family education was done regarding all aspects of care and mobility.    Disposition:  Home  Diet: Home  Special Instructions: 1. No weight on right wrist. Ok to bear weight on right forearm/elbow.       Medication List    STOP taking these medications        enoxaparin 40 MG/0.4ML injection  Commonly  known as:  LOVENOX      TAKE these medications        acetaminophen 325 MG tablet  Commonly known as:  TYLENOL  Take 1-2 tablets (325-650 mg total) by mouth every 4 (four) hours as needed for mild pain.     cholecalciferol 1000 UNITS tablet  Commonly known as:  VITAMIN D  Take 1,000 Units by mouth daily.     famotidine 10 MG tablet  Commonly known as:  PEPCID  Take 1 tablet (10 mg total) by mouth at bedtime.     ferrous sulfate 325 (65 FE) MG tablet  Take 1 tablet (325 mg total) by mouth 2 (two) times daily with a meal.     methocarbamol 500 MG tablet  Commonly known as:  ROBAXIN  Take 1 tablet (500 mg total) by mouth every 8 (eight) hours as needed for muscle spasms.     nicotine 21 mg/24hr patch  Commonly known as:  NICODERM CQ - dosed in mg/24 hours  Place 1 patch (21 mg total) onto the skin daily.     OxyCODONE 15 mg T12a 12 hr tablet--Rx #30 pills  Commonly known as:  OXYCONTIN  Take 1 tablet (15 mg total) by mouth every 12 (twelve) hours. One pill twice a day for 10 days. Then decrease to once a day till gone.     Oxycodone HCl 10 MG Tabs--Rx 75 pills   Take 1-1.5 tablets (10-15 mg total) by mouth every 6 (six) hours as needed.     phenytoin 200 MG ER capsule  Commonly known as:  DILANTIN  Take 1 capsule (200 mg total) by mouth 2 (two) times daily.     protein supplement Powd  Take 6 g by mouth 3 (three) times daily with meals.     tiotropium 18 MCG inhalation capsule  Commonly known as:  SPIRIVA  Place 1 capsule (18 mcg total) into inhaler and inhale daily.       Follow-up Information    Follow up with Gennette Pac, MD On 06/14/2014.   Specialty:  Family Medicine   Why:  APPT @ 2;45 PM   Contact information:   [place] [address] [phone]       Follow up with Charlett Blake, MD On 07/03/2014.   Specialty:  Physical Medicine and Rehabilitation   Why:  Be there at 3:15 for  3:30 pm appointment    Contact information:    [place] [address] [phone]       Follow up with Marianna Payment, MD On 06/09/2014.   Specialty:  Orthopedic Surgery   Why:  For suture removal/ For wound re-check--call for time   Contact information:   [address] [place] [address] [phone]       Signed: Reesa Chew  S 06/06/2014, 9:29 AM

## 2014-06-06 NOTE — Discharge Instructions (Signed)
Inpatient Rehab Discharge Instructions  Joshua Reed Discharge date and time: 06/06/14   Activities/Precautions/ Functional Status: Activity: activity as tolerated with assistance Diet: regular diet Wound Care:  Wash hip incision with soap and water. Pat dry--no lotions, creams or ointments. Call MD if you notice redness, swelling, drainage, fever or increase in pain.  Functional status:  ___ No restrictions     ___ Walk up steps independently _X__ 24/7 supervision/assistance   ___ Walk up steps with assistance ___ Intermittent supervision/assistance  ___ Bathe/dress independently ___ Walk with walker     _X__ Bathe/dress with assistance ___ Walk Independently    ___ Shower independently _X__ Walk with assistance    ___ Shower with assistance ___ No alcohol     ___ Return to work/school ________  Special Instructions: 1. No weight on right wrist. Ok to bear weight on right forearm/elbow.   COMMUNITY REFERRALS UPON DISCHARGE; Home Health:   PT, OT, RN   Agency:[employer] Phone:     Medical Equipment/Items Ordered:R-PLATFORM ROLLING WALKER, BEDSIDE COMMODE AND TUB BENCH  Agency/Supplier:[employer]  My questions have been answered and I understand these instructions. I will adhere to these goals and the provided educational materials after my discharge from the hospital.  Patient/Caregiver Signature _______________________________ Date __________  Clinician Signature _______________________________________ Date __________  Please bring this form and your medication list with you to all your follow-up doctor's appointments.

## 2014-06-13 ENCOUNTER — Inpatient Hospital Stay (HOSPITAL_COMMUNITY)
Admission: EM | Admit: 2014-06-13 | Discharge: 2014-07-03 | DRG: 371 | Disposition: A | Discharge status: Home Health | Payer: Worker's Compensation | Attending: Internal Medicine | Admitting: Internal Medicine

## 2014-06-13 ENCOUNTER — Encounter (HOSPITAL_COMMUNITY): Payer: Self-pay | Admitting: *Deleted

## 2014-06-13 ENCOUNTER — Emergency Department (HOSPITAL_COMMUNITY): Payer: Worker's Compensation

## 2014-06-13 DIAGNOSIS — Z888 Allergy status to other drugs, medicaments and biological substances status: Secondary | ICD-10-CM

## 2014-06-13 DIAGNOSIS — H3530 Unspecified macular degeneration: Secondary | ICD-10-CM | POA: Diagnosis present

## 2014-06-13 DIAGNOSIS — E861 Hypovolemia: Secondary | ICD-10-CM | POA: Diagnosis present

## 2014-06-13 DIAGNOSIS — E559 Vitamin D deficiency, unspecified: Secondary | ICD-10-CM | POA: Diagnosis present

## 2014-06-13 DIAGNOSIS — E875 Hyperkalemia: Secondary | ICD-10-CM | POA: Diagnosis not present

## 2014-06-13 DIAGNOSIS — A0472 Enterocolitis due to Clostridium difficile, not specified as recurrent: Secondary | ICD-10-CM | POA: Diagnosis present

## 2014-06-13 DIAGNOSIS — E876 Hypokalemia: Secondary | ICD-10-CM | POA: Diagnosis not present

## 2014-06-13 DIAGNOSIS — A047 Enterocolitis due to Clostridium difficile: Principal | ICD-10-CM | POA: Diagnosis present

## 2014-06-13 DIAGNOSIS — Z681 Body mass index (BMI) 19 or less, adult: Secondary | ICD-10-CM

## 2014-06-13 DIAGNOSIS — Z79899 Other long term (current) drug therapy: Secondary | ICD-10-CM

## 2014-06-13 DIAGNOSIS — Z7982 Long term (current) use of aspirin: Secondary | ICD-10-CM

## 2014-06-13 DIAGNOSIS — Z113 Encounter for screening for infections with a predominantly sexual mode of transmission: Secondary | ICD-10-CM | POA: Insufficient documentation

## 2014-06-13 DIAGNOSIS — R1084 Generalized abdominal pain: Secondary | ICD-10-CM

## 2014-06-13 DIAGNOSIS — G4700 Insomnia, unspecified: Secondary | ICD-10-CM | POA: Diagnosis present

## 2014-06-13 DIAGNOSIS — E872 Acidosis, unspecified: Secondary | ICD-10-CM | POA: Diagnosis present

## 2014-06-13 DIAGNOSIS — S72141D Displaced intertrochanteric fracture of right femur, subsequent encounter for closed fracture with routine healing: Secondary | ICD-10-CM

## 2014-06-13 DIAGNOSIS — R197 Diarrhea, unspecified: Secondary | ICD-10-CM | POA: Diagnosis not present

## 2014-06-13 DIAGNOSIS — S52501A Unspecified fracture of the lower end of right radius, initial encounter for closed fracture: Secondary | ICD-10-CM | POA: Diagnosis present

## 2014-06-13 DIAGNOSIS — Z87891 Personal history of nicotine dependence: Secondary | ICD-10-CM

## 2014-06-13 DIAGNOSIS — G40909 Epilepsy, unspecified, not intractable, without status epilepticus: Secondary | ICD-10-CM | POA: Diagnosis present

## 2014-06-13 DIAGNOSIS — E43 Unspecified severe protein-calorie malnutrition: Secondary | ICD-10-CM | POA: Diagnosis present

## 2014-06-13 DIAGNOSIS — Z9889 Other specified postprocedural states: Secondary | ICD-10-CM

## 2014-06-13 DIAGNOSIS — J449 Chronic obstructive pulmonary disease, unspecified: Secondary | ICD-10-CM | POA: Diagnosis present

## 2014-06-13 DIAGNOSIS — R531 Weakness: Secondary | ICD-10-CM | POA: Diagnosis present

## 2014-06-13 DIAGNOSIS — S72141A Displaced intertrochanteric fracture of right femur, initial encounter for closed fracture: Secondary | ICD-10-CM | POA: Diagnosis present

## 2014-06-13 DIAGNOSIS — J441 Chronic obstructive pulmonary disease with (acute) exacerbation: Secondary | ICD-10-CM | POA: Diagnosis present

## 2014-06-13 DIAGNOSIS — F1010 Alcohol abuse, uncomplicated: Secondary | ICD-10-CM | POA: Diagnosis present

## 2014-06-13 DIAGNOSIS — K089 Disorder of teeth and supporting structures, unspecified: Secondary | ICD-10-CM | POA: Diagnosis present

## 2014-06-13 DIAGNOSIS — K5289 Other specified noninfective gastroenteritis and colitis: Secondary | ICD-10-CM | POA: Diagnosis present

## 2014-06-13 DIAGNOSIS — W19XXXD Unspecified fall, subsequent encounter: Secondary | ICD-10-CM | POA: Diagnosis present

## 2014-06-13 DIAGNOSIS — L7622 Postprocedural hemorrhage and hematoma of skin and subcutaneous tissue following other procedure: Secondary | ICD-10-CM | POA: Diagnosis present

## 2014-06-13 DIAGNOSIS — R109 Unspecified abdominal pain: Secondary | ICD-10-CM | POA: Insufficient documentation

## 2014-06-13 DIAGNOSIS — S52501D Unspecified fracture of the lower end of right radius, subsequent encounter for closed fracture with routine healing: Secondary | ICD-10-CM

## 2014-06-13 DIAGNOSIS — E871 Hypo-osmolality and hyponatremia: Secondary | ICD-10-CM | POA: Diagnosis present

## 2014-06-13 DIAGNOSIS — E860 Dehydration: Secondary | ICD-10-CM

## 2014-06-13 LAB — CBC WITH DIFFERENTIAL/PLATELET
BASOS ABS: 0 10*3/uL (ref 0.0–0.1)
BASOS PCT: 0 % (ref 0–1)
Eosinophils Absolute: 0 10*3/uL (ref 0.0–0.7)
Eosinophils Relative: 0 % (ref 0–5)
HEMATOCRIT: 40.6 % (ref 39.0–52.0)
Hemoglobin: 13.1 g/dL (ref 13.0–17.0)
LYMPHS PCT: 4 % — AB (ref 12–46)
Lymphs Abs: 0.5 10*3/uL — ABNORMAL LOW (ref 0.7–4.0)
MCH: 29.6 pg (ref 26.0–34.0)
MCHC: 32.3 g/dL (ref 30.0–36.0)
MCV: 91.9 fL (ref 78.0–100.0)
MONO ABS: 1.2 10*3/uL — AB (ref 0.1–1.0)
Monocytes Relative: 9 % (ref 3–12)
Neutro Abs: 12 10*3/uL — ABNORMAL HIGH (ref 1.7–7.7)
Neutrophils Relative %: 87 % — ABNORMAL HIGH (ref 43–77)
PLATELETS: 484 10*3/uL — AB (ref 150–400)
RBC: 4.42 MIL/uL (ref 4.22–5.81)
RDW: 17.9 % — AB (ref 11.5–15.5)
WBC: 13.7 10*3/uL — AB (ref 4.0–10.5)

## 2014-06-13 LAB — COMPREHENSIVE METABOLIC PANEL
ALBUMIN: 3.1 g/dL — AB (ref 3.5–5.2)
ALT: 21 U/L (ref 0–53)
AST: 31 U/L (ref 0–37)
Alkaline Phosphatase: 189 U/L — ABNORMAL HIGH (ref 39–117)
Anion gap: 13 (ref 5–15)
BUN: 14 mg/dL (ref 6–23)
CALCIUM: 8.8 mg/dL (ref 8.4–10.5)
CO2: 18 meq/L — AB (ref 19–32)
CREATININE: 0.88 mg/dL (ref 0.50–1.35)
Chloride: 101 mEq/L (ref 96–112)
GFR calc Af Amer: >90 mL/min (ref >90)
GFR, EST NON AFRICAN AMERICAN: 87 mL/min — AB (ref >90)
Glucose, Bld: 122 mg/dL — ABNORMAL HIGH (ref 70–99)
Potassium: 4.5 mEq/L (ref 3.7–5.3)
SODIUM: 132 meq/L — AB (ref 137–147)
Total Bilirubin: 0.6 mg/dL (ref 0.3–1.2)
Total Protein: 7 g/dL (ref 6.0–8.3)

## 2014-06-13 LAB — URINALYSIS, ROUTINE W REFLEX MICROSCOPIC
Glucose, UA: NEGATIVE mg/dL
Hgb urine dipstick: NEGATIVE
KETONES UR: NEGATIVE mg/dL
Leukocytes, UA: NEGATIVE
Nitrite: NEGATIVE
PROTEIN: 30 mg/dL — AB
Specific Gravity, Urine: 1.031 — ABNORMAL HIGH (ref 1.005–1.030)
UROBILINOGEN UA: 0.2 mg/dL (ref 0.0–1.0)
pH: 5.5 (ref 5.0–8.0)

## 2014-06-13 LAB — URINE MICROSCOPIC-ADD ON

## 2014-06-13 MED ORDER — ONDANSETRON HCL 4 MG/2ML IJ SOLN
4.0000 mg | Freq: Once | INTRAMUSCULAR | Status: AC
  Administered 2014-06-14: 4 mg via INTRAVENOUS
  Filled 2014-06-13: qty 2

## 2014-06-13 MED ORDER — MORPHINE SULFATE 4 MG/ML IJ SOLN
4.0000 mg | Freq: Once | INTRAMUSCULAR | Status: AC
  Administered 2014-06-14: 4 mg via INTRAVENOUS
  Filled 2014-06-13: qty 1

## 2014-06-13 MED ORDER — SODIUM CHLORIDE 0.9 % IV SOLN
1000.0000 mL | Freq: Once | INTRAVENOUS | Status: AC
  Administered 2014-06-13: 1000 mL via INTRAVENOUS

## 2014-06-13 MED ORDER — SODIUM CHLORIDE 0.9 % IV SOLN
1000.0000 mL | INTRAVENOUS | Status: DC
  Administered 2014-06-13: 1000 mL via INTRAVENOUS

## 2014-06-13 MED ORDER — IOHEXOL 300 MG/ML  SOLN
100.0000 mL | Freq: Once | INTRAMUSCULAR | Status: AC | PRN
  Administered 2014-06-13: 100 mL via INTRAVENOUS

## 2014-06-13 MED ORDER — LOPERAMIDE HCL 2 MG PO CAPS
4.0000 mg | ORAL_CAPSULE | Freq: Once | ORAL | Status: AC
  Administered 2014-06-13: 4 mg via ORAL
  Filled 2014-06-13: qty 2

## 2014-06-13 NOTE — ED Notes (Addendum)
Per EMS, pt has had diarrhea 7-8 times a day since he was released from the hospital last week. Pt complains of occasional dizziness when standing, EMS reports pt was orthostatic upon arrival. Pt given 569ml NS en route to hospital. Pt states he gave a stool sample to his doctor yesterday, but does not know the results yet.

## 2014-06-13 NOTE — ED Provider Notes (Signed)
CSN: [id number]     Arrival date & time 06/13/14  1947 History   First MD Initiated Contact with Patient 06/13/14 1958     Chief Complaint  Patient presents with  . Diarrhea     (Consider location/radiation/quality/duration/timing/severity/associated sxs/prior Treatment) HPI  Patient reports he fell off a ladder on November 4 and sustained a fracture of his right hip, intertrochanteric, and fracture of his right wrist. Patient was admitted to the hospital for repair. He was discharged a week ago. He states he had some diarrhea while in the hospital. However the day after he was discharged which was 6 days ago he started having markedly increased amount of diarrhea that he describes as watery and green. He does not see any blood. He complains of diffuse abdominal pain which is cramping in nature and constant. He states he can hear his stomach gurgling and making noise. He reports today the pain got more intense. He denies fever, has nausea without vomiting. Today he felt lightheaded and dizzy when he tried to stand up and his wife states he looks paler than normal. He states he was on antibiotics when he was first admitted to the hospital but he's not on antibiotics now. Patient did had a test for C. difficile about 11 days ago that was negative. He also reports today he has had decreased appetite and intake.   PCP Dr Lawernce [facility] Dr Erlinda Hong  Past Medical History  Diagnosis Date  . Seizure disorder 2205    ALCOHOL RELATED  . Macular degeneration   . COPD (chronic obstructive pulmonary disease)   . Alcohol abuse   . Vitamin D deficiency    Past Surgical History  Procedure Laterality Date  . Cardiac catheterization  2002    normal  . Open reduction internal fixation (orif) distal radial fracture Right 05/24/2014    Procedure: OPEN REDUCTION INTERNAL FIXATION (ORIF) DISTAL RADIAL FRACTURE;  Surgeon: Marianna Payment, MD;  Location: [place];  Service: Orthopedics;  Laterality: Right;   . Intramedullary (im) nail intertrochanteric Right 05/24/2014    Procedure: INTRAMEDULLARY (IM) NAIL INTERTROCHANTRIC;  Surgeon: Marianna Payment, MD;  Location: [place];  Service: Orthopedics;  Laterality: Right;   Family History  Problem Relation Age of Onset  . Heart attack Father    History  Substance Use Topics  . Smoking status: Former Smoker    Types: Cigarettes    Quit date: 05/07/2011  . Smokeless tobacco: Never Used  . Alcohol Use: 2.4 oz/week    4 Cans of beer per week     Comment: quit 2-3  years ago  lives at home Lives with spouse employed  Review of Systems  All other systems reviewed and are negative.     Allergies  Acyclovir and related  Home Medications   Prior to Admission medications   Medication Sig Start Date End Date Taking? Authorizing Provider  acetaminophen (TYLENOL) 325 MG tablet Take 1-2 tablets (325-650 mg total) by mouth every 4 (four) hours as needed for mild pain. 06/06/14  Yes Ivan Anchors Love, PA-C  aspirin 325 MG tablet Take 325 mg by mouth 2 (two) times daily.   Yes Historical Provider, MD  cholecalciferol (VITAMIN D) 1000 UNITS tablet Take 1,000 Units by mouth daily.   Yes Historical Provider, MD  Coenzyme Q10 (CO Q 10 PO) Take 1 tablet by mouth daily.   Yes Historical Provider, MD  famotidine (PEPCID) 10 MG tablet Take 1 tablet (10 mg total) by mouth at  bedtime. 06/06/14  Yes Ivan Anchors Love, PA-C  ferrous sulfate 325 (65 FE) MG tablet Take 1 tablet (325 mg total) by mouth 2 (two) times daily with a meal. 06/06/14  Yes Ivan Anchors Love, PA-C  methocarbamol (ROBAXIN) 500 MG tablet Take 1 tablet (500 mg total) by mouth every 8 (eight) hours as needed for muscle spasms. 06/06/14  Yes Ivan Anchors Love, PA-C  Multiple Vitamin (MULTIVITAMIN WITH MINERALS) TABS tablet Take 1 tablet by mouth daily.   Yes Historical Provider, MD  nicotine (NICODERM CQ - DOSED IN MG/24 HOURS) 21 mg/24hr patch Place 1 patch (21 mg total) onto the skin daily. 06/06/14  Yes  Ivan Anchors Love, PA-C  Omega-3 Fatty Acids (FISH OIL PO) Take 1 capsule by mouth daily.   Yes Historical Provider, MD  OxyCODONE (OXYCONTIN) 15 mg T12A 12 hr tablet Take 1 tablet (15 mg total) by mouth every 12 (twelve) hours. One pill twice a day for 10 days. Then decrease to once a day till gone. 06/06/14  Yes Ivan Anchors Love, PA-C  oxyCODONE 10 MG TABS Take 1-1.5 tablets (10-15 mg total) by mouth every 6 (six) hours as needed. Patient taking differently: Take 10-15 mg by mouth every 6 (six) hours as needed. For pain 06/06/14  Yes Ivan Anchors Love, PA-C  phenytoin (DILANTIN) 100 MG ER capsule Take 200 mg by mouth 2 (two) times daily.   Yes Historical Provider, MD  protein supplement (RESOURCE BENEPROTEIN) POWD Take 6 g by mouth 3 (three) times daily with meals. 06/06/14  Yes Ivan Anchors Love, PA-C  tiotropium (SPIRIVA) 18 MCG inhalation capsule Place 1 capsule (18 mcg total) into inhaler and inhale daily. 06/06/14  Yes Ivan Anchors Love, PA-C  phenytoin (DILANTIN) 200 MG ER capsule Take 1 capsule (200 mg total) by mouth 2 (two) times daily. Patient not taking: Reported on 06/13/2014 06/06/14   Ivan Anchors Love, PA-C   BP 118/64 mmHg  Pulse 78  Temp(Src) 97.5 F (36.4 C) (Oral)  Resp 18  SpO2 98%  Vital signs normal    Orthostatic Vital Signs Orthostatic Lying  - BP- Lying: 111/60 mmHg ; Pulse- Lying: 76  Orthostatic Sitting - BP- Sitting: 109/60 mmHg ; Pulse- Sitting: 89  Orthostatic Standing at 0 minutes - BP- Standing at 0 minutes: 103/65 mmHg ; Pulse- Standing at 0 minutes: 107   + for increase in pulses on standing  Physical Exam  Constitutional: He is oriented to person, place, and time.  Non-toxic appearance. He does not appear ill. No distress.  Thin male  HENT:  Head: Normocephalic and atraumatic.  Right Ear: External ear normal.  Left Ear: External ear normal.  Nose: Nose normal. No mucosal edema or rhinorrhea.  Mouth/Throat: Mucous membranes are normal. No dental abscesses or uvula  swelling. No posterior oropharyngeal erythema.  Dry tongue  Eyes: Conjunctivae and EOM are normal. Pupils are equal, round, and reactive to light.  Neck: Normal range of motion and full passive range of motion without pain. Neck supple.  Cardiovascular: Normal rate, regular rhythm and normal heart sounds.  Exam reveals no gallop and no friction rub.   No murmur heard. Pulmonary/Chest: Effort normal and breath sounds normal. No respiratory distress. He has no wheezes. He has no rhonchi. He has no rales. He exhibits no tenderness and no crepitus.  Abdominal: Soft. Normal appearance and bowel sounds are normal. He exhibits no distension. There is generalized tenderness. There is no rebound and no guarding.  Active bowel sounds  Musculoskeletal:  Normal range of motion. He exhibits no edema or tenderness.  Moves all extremities well.   Neurological: He is alert and oriented to person, place, and time. He has normal strength. No cranial nerve deficit.  Skin: Skin is warm, dry and intact. No rash noted. No erythema. There is pallor.  Psychiatric: He has a normal mood and affect. His speech is normal and behavior is normal. His mood appears not anxious.  Nursing note and vitals reviewed.   ED Course  Procedures (including critical care time)  Medications  0.9 %  sodium chloride infusion (1,000 mLs Intravenous New Bag/Given 06/13/14 2251)    Followed by  0.9 %  sodium chloride infusion (0 mLs Intravenous Stopped 06/13/14 2251)    Followed by  0.9 %  sodium chloride infusion (1,000 mLs Intravenous New Bag/Given 06/13/14 2251)  morphine 4 MG/ML injection 4 mg (not administered)  ondansetron (ZOFRAN) injection 4 mg (not administered)  loperamide (IMODIUM) capsule 4 mg (4 mg Oral Given 06/13/14 2044)  iohexol (OMNIPAQUE) 300 MG/ML solution 100 mL (100 mLs Intravenous Contrast Given 06/13/14 2356)   Pt given IV fluids for his dehydration. He was able to give a stool sample. Pt agreeable to getting a  CT scan with his diffuse abdominal pain.   24:00 Pt turned over to Dr Sharol Given at the change of shift to get his AP CT results.    Labs Review Results for orders placed or performed during the hospital encounter of 06/13/14  Urinalysis, Routine w reflex microscopic  Result Value Ref Range   Color, Urine ORANGE (A) YELLOW   APPearance CLOUDY (A) CLEAR   Specific Gravity, Urine 1.031 (H) 1.005 - 1.030   pH 5.5 5.0 - 8.0   Glucose, UA NEGATIVE NEGATIVE mg/dL   Hgb urine dipstick NEGATIVE NEGATIVE   Bilirubin Urine SMALL (A) NEGATIVE   Ketones, ur NEGATIVE NEGATIVE mg/dL   Protein, ur 30 (A) NEGATIVE mg/dL   Urobilinogen, UA 0.2 0.0 - 1.0 mg/dL   Nitrite NEGATIVE NEGATIVE   Leukocytes, UA NEGATIVE NEGATIVE  CBC with Differential  Result Value Ref Range   WBC 13.7 (H) 4.0 - 10.5 K/uL   RBC 4.42 4.22 - 5.81 MIL/uL   Hemoglobin 13.1 13.0 - 17.0 g/dL   HCT 40.6 39.0 - 52.0 %   MCV 91.9 78.0 - 100.0 fL   MCH 29.6 26.0 - 34.0 pg   MCHC 32.3 30.0 - 36.0 g/dL   RDW 17.9 (H) 11.5 - 15.5 %   Platelets 484 (H) 150 - 400 K/uL   Neutrophils Relative % 87 (H) 43 - 77 %   Neutro Abs 12.0 (H) 1.7 - 7.7 K/uL   Lymphocytes Relative 4 (L) 12 - 46 %   Lymphs Abs 0.5 (L) 0.7 - 4.0 K/uL   Monocytes Relative 9 3 - 12 %   Monocytes Absolute 1.2 (H) 0.1 - 1.0 K/uL   Eosinophils Relative 0 0 - 5 %   Eosinophils Absolute 0.0 0.0 - 0.7 K/uL   Basophils Relative 0 0 - 1 %   Basophils Absolute 0.0 0.0 - 0.1 K/uL  Comprehensive metabolic panel  Result Value Ref Range   Sodium 132 (L) 137 - 147 mEq/L   Potassium 4.5 3.7 - 5.3 mEq/L   Chloride 101 96 - 112 mEq/L   CO2 18 (L) 19 - 32 mEq/L   Glucose, Bld 122 (H) 70 - 99 mg/dL   BUN 14 6 - 23 mg/dL   Creatinine, Ser 0.88 0.50 - 1.35  mg/dL   Calcium 8.8 8.4 - 10.5 mg/dL   Total Protein 7.0 6.0 - 8.3 g/dL   Albumin 3.1 (L) 3.5 - 5.2 g/dL   AST 31 0 - 37 U/L   ALT 21 0 - 53 U/L   Alkaline Phosphatase 189 (H) 39 - 117 U/L   Total Bilirubin 0.6 0.3 - 1.2  mg/dL   GFR calc non Af Amer 87 (L) >90 mL/min   GFR calc Af Amer >90 >90 mL/min   Anion gap 13 5 - 15  Urine microscopic-add on  Result Value Ref Range   WBC, UA 3-6 <3 WBC/hpf   Casts HYALINE CASTS (A) NEGATIVE   Crystals CA OXALATE CRYSTALS (A) NEGATIVE     Laboratory interpretation all normal except leukocytosis, hyponatremia, low Bicarb with normal anion gap     Imaging Review No results found.    Dg Wrist 2 Views Right  05/24/2014   CLINICAL DATA:  [place] off ladder today from 10 feet. Deformity of right wrist. Right wrist pain. Initial encounter.   IMPRESSION: Comminuted, impacted distal right radial fracture. Distal ulnar/ulnar styloid fracture.   Electronically Signed   By: Rolm Baptise M.D.   On: 05/24/2014 15:49     Ct Chest W Contrast  Ct Abdomen Pelvis W Contrast  05/24/2014   CLINICAL DATA:  Trauma with fall from ladder 6-8 feet. Unable to raise arms above head.    IMPRESSION: Displaced right intertrochanteric hip fracture. Nondisplaced fracture of the right inferior pubic ramus.  No acute findings within the chest. No acute intra-abdominal findings.  Minimal nonspecific increased number of small mesenteric lymph nodes. Recommend followup abdominal pelvic CT 3 months.  Minimal centrilobular emphysematous disease.  These results were called by telephone at the time of interpretation on 05/24/2014 at 5:28 pm to Dr. Ermalinda Memos, who verbally acknowledged these results.   Electronically Signed   By: Marin Olp M.D.   On: 05/24/2014 17:28    Dg Chest Portable 1 View  05/24/2014   CLINICAL DATA:  Fall 6-8 feet from ladder today. Generalized right pain. Initial encounter    IMPRESSION: COPD.  No active disease.   Electronically Signed   By: Rolm Baptise M.D.   On: 05/24/2014 15:03      EKG Interpretation None      MDM   Final diagnoses:  Dehydration  Diarrhea  Weakness  Generalized abdominal pain    Disposition pending   Rolland Porter, MD, Abram Sander     Janice Norrie, MD 06/14/14 1558

## 2014-06-13 NOTE — ED Notes (Signed)
Bed: WA21 Expected date: 06/13/14 Expected time: 7:28 PM Means of arrival: Ambulance Comments: Diarrhea/orthostatic

## 2014-06-14 DIAGNOSIS — Z681 Body mass index (BMI) 19 or less, adult: Secondary | ICD-10-CM | POA: Diagnosis not present

## 2014-06-14 DIAGNOSIS — Z7982 Long term (current) use of aspirin: Secondary | ICD-10-CM | POA: Diagnosis not present

## 2014-06-14 DIAGNOSIS — E860 Dehydration: Secondary | ICD-10-CM | POA: Diagnosis not present

## 2014-06-14 DIAGNOSIS — E872 Acidosis, unspecified: Secondary | ICD-10-CM | POA: Diagnosis present

## 2014-06-14 DIAGNOSIS — E876 Hypokalemia: Secondary | ICD-10-CM | POA: Diagnosis not present

## 2014-06-14 DIAGNOSIS — Z87891 Personal history of nicotine dependence: Secondary | ICD-10-CM | POA: Diagnosis not present

## 2014-06-14 DIAGNOSIS — L7622 Postprocedural hemorrhage and hematoma of skin and subcutaneous tissue following other procedure: Secondary | ICD-10-CM | POA: Diagnosis present

## 2014-06-14 DIAGNOSIS — G40909 Epilepsy, unspecified, not intractable, without status epilepticus: Secondary | ICD-10-CM | POA: Diagnosis present

## 2014-06-14 DIAGNOSIS — G4700 Insomnia, unspecified: Secondary | ICD-10-CM | POA: Diagnosis present

## 2014-06-14 DIAGNOSIS — A047 Enterocolitis due to Clostridium difficile: Secondary | ICD-10-CM | POA: Diagnosis present

## 2014-06-14 DIAGNOSIS — J449 Chronic obstructive pulmonary disease, unspecified: Secondary | ICD-10-CM | POA: Diagnosis present

## 2014-06-14 DIAGNOSIS — E875 Hyperkalemia: Secondary | ICD-10-CM | POA: Diagnosis not present

## 2014-06-14 DIAGNOSIS — E871 Hypo-osmolality and hyponatremia: Secondary | ICD-10-CM | POA: Diagnosis present

## 2014-06-14 DIAGNOSIS — Z79899 Other long term (current) drug therapy: Secondary | ICD-10-CM | POA: Diagnosis not present

## 2014-06-14 DIAGNOSIS — R197 Diarrhea, unspecified: Secondary | ICD-10-CM | POA: Diagnosis present

## 2014-06-14 DIAGNOSIS — S52501D Unspecified fracture of the lower end of right radius, subsequent encounter for closed fracture with routine healing: Secondary | ICD-10-CM | POA: Diagnosis not present

## 2014-06-14 DIAGNOSIS — A0472 Enterocolitis due to Clostridium difficile, not specified as recurrent: Secondary | ICD-10-CM | POA: Diagnosis present

## 2014-06-14 DIAGNOSIS — Z888 Allergy status to other drugs, medicaments and biological substances status: Secondary | ICD-10-CM | POA: Diagnosis not present

## 2014-06-14 DIAGNOSIS — W19XXXD Unspecified fall, subsequent encounter: Secondary | ICD-10-CM | POA: Diagnosis present

## 2014-06-14 DIAGNOSIS — H3530 Unspecified macular degeneration: Secondary | ICD-10-CM | POA: Diagnosis present

## 2014-06-14 DIAGNOSIS — E861 Hypovolemia: Secondary | ICD-10-CM | POA: Diagnosis present

## 2014-06-14 DIAGNOSIS — S72141D Displaced intertrochanteric fracture of right femur, subsequent encounter for closed fracture with routine healing: Secondary | ICD-10-CM | POA: Diagnosis not present

## 2014-06-14 DIAGNOSIS — F1010 Alcohol abuse, uncomplicated: Secondary | ICD-10-CM | POA: Diagnosis present

## 2014-06-14 DIAGNOSIS — R531 Weakness: Secondary | ICD-10-CM | POA: Diagnosis present

## 2014-06-14 DIAGNOSIS — E43 Unspecified severe protein-calorie malnutrition: Secondary | ICD-10-CM | POA: Diagnosis present

## 2014-06-14 DIAGNOSIS — E559 Vitamin D deficiency, unspecified: Secondary | ICD-10-CM | POA: Diagnosis present

## 2014-06-14 DIAGNOSIS — K5289 Other specified noninfective gastroenteritis and colitis: Secondary | ICD-10-CM | POA: Diagnosis present

## 2014-06-14 DIAGNOSIS — Z9889 Other specified postprocedural states: Secondary | ICD-10-CM | POA: Diagnosis not present

## 2014-06-14 DIAGNOSIS — K089 Disorder of teeth and supporting structures, unspecified: Secondary | ICD-10-CM | POA: Diagnosis present

## 2014-06-14 LAB — CLOSTRIDIUM DIFFICILE BY PCR
Toxigenic C. Difficile by PCR: POSITIVE — AB
Toxigenic C. Difficile by PCR: POSITIVE — AB

## 2014-06-14 LAB — CBC WITH DIFFERENTIAL/PLATELET
BASOS PCT: 0 % (ref 0–1)
Basophils Absolute: 0 10*3/uL (ref 0.0–0.1)
EOS ABS: 0.1 10*3/uL (ref 0.0–0.7)
EOS PCT: 1 % (ref 0–5)
HCT: 34.5 % — ABNORMAL LOW (ref 39.0–52.0)
HEMOGLOBIN: 11.3 g/dL — AB (ref 13.0–17.0)
Lymphocytes Relative: 11 % — ABNORMAL LOW (ref 12–46)
Lymphs Abs: 1.1 10*3/uL (ref 0.7–4.0)
MCH: 29.7 pg (ref 26.0–34.0)
MCHC: 32.8 g/dL (ref 30.0–36.0)
MCV: 90.8 fL (ref 78.0–100.0)
Monocytes Absolute: 1.1 10*3/uL — ABNORMAL HIGH (ref 0.1–1.0)
Monocytes Relative: 11 % (ref 3–12)
NEUTROS PCT: 77 % (ref 43–77)
Neutro Abs: 7.8 10*3/uL — ABNORMAL HIGH (ref 1.7–7.7)
Platelets: 465 10*3/uL — ABNORMAL HIGH (ref 150–400)
RBC: 3.8 MIL/uL — ABNORMAL LOW (ref 4.22–5.81)
RDW: 17.8 % — ABNORMAL HIGH (ref 11.5–15.5)
WBC: 10.2 10*3/uL (ref 4.0–10.5)

## 2014-06-14 LAB — COMPREHENSIVE METABOLIC PANEL
ALBUMIN: 2.6 g/dL — AB (ref 3.5–5.2)
ALK PHOS: 171 U/L — AB (ref 39–117)
ALT: 17 U/L (ref 0–53)
ANION GAP: 13 (ref 5–15)
AST: 24 U/L (ref 0–37)
BUN: 12 mg/dL (ref 6–23)
CALCIUM: 8.2 mg/dL — AB (ref 8.4–10.5)
CO2: 18 mEq/L — ABNORMAL LOW (ref 19–32)
CREATININE: 0.78 mg/dL (ref 0.50–1.35)
Chloride: 103 mEq/L (ref 96–112)
GFR calc Af Amer: >90 mL/min (ref >90)
GFR calc non Af Amer: >90 mL/min (ref >90)
Glucose, Bld: 95 mg/dL (ref 70–99)
POTASSIUM: 4.1 meq/L (ref 3.7–5.3)
Sodium: 134 mEq/L — ABNORMAL LOW (ref 137–147)
TOTAL PROTEIN: 5.9 g/dL — AB (ref 6.0–8.3)
Total Bilirubin: 0.5 mg/dL (ref 0.3–1.2)

## 2014-06-14 LAB — ETHANOL: Alcohol, Ethyl (B): <11 mg/dL (ref 0–11)

## 2014-06-14 LAB — LACTIC ACID, PLASMA: Lactic Acid, Venous: 0.9 mmol/L (ref 0.5–2.2)

## 2014-06-14 LAB — OCCULT BLOOD X 1 CARD TO LAB, STOOL: FECAL OCCULT BLD: NEGATIVE

## 2014-06-14 MED ORDER — BOOST PLUS PO LIQD
237.0000 mL | Freq: Three times a day (TID) | ORAL | Status: DC
  Administered 2014-06-15 – 2014-06-18 (×8): 237 mL via ORAL
  Filled 2014-06-14 (×13): qty 237

## 2014-06-14 MED ORDER — OXYCODONE HCL 5 MG PO TABS
10.0000 mg | ORAL_TABLET | Freq: Four times a day (QID) | ORAL | Status: DC | PRN
  Administered 2014-06-19: 15 mg via ORAL
  Administered 2014-06-19 – 2014-06-22 (×5): 10 mg via ORAL
  Administered 2014-06-23 – 2014-06-27 (×4): 15 mg via ORAL
  Filled 2014-06-14: qty 3
  Filled 2014-06-14: qty 2
  Filled 2014-06-14 (×2): qty 3
  Filled 2014-06-14: qty 2
  Filled 2014-06-14: qty 3
  Filled 2014-06-14 (×2): qty 2
  Filled 2014-06-14: qty 3
  Filled 2014-06-14: qty 2
  Filled 2014-06-14: qty 3

## 2014-06-14 MED ORDER — TIOTROPIUM BROMIDE MONOHYDRATE 18 MCG IN CAPS
18.0000 ug | ORAL_CAPSULE | Freq: Every day | RESPIRATORY_TRACT | Status: DC
  Filled 2014-06-14 (×2): qty 5

## 2014-06-14 MED ORDER — CIPROFLOXACIN IN D5W 400 MG/200ML IV SOLN
400.0000 mg | Freq: Two times a day (BID) | INTRAVENOUS | Status: DC
  Administered 2014-06-14: 400 mg via INTRAVENOUS
  Filled 2014-06-14 (×2): qty 200

## 2014-06-14 MED ORDER — RESOURCE INSTANT PROTEIN PO PWD PACKET
6.0000 g | Freq: Three times a day (TID) | ORAL | Status: DC
  Administered 2014-06-14 – 2014-06-18 (×4): 6 g via ORAL
  Filled 2014-06-14: qty 227
  Filled 2014-06-14 (×7): qty 6

## 2014-06-14 MED ORDER — SODIUM CHLORIDE 0.9 % IV SOLN
INTRAVENOUS | Status: DC
  Administered 2014-06-14 – 2014-06-15 (×3): via INTRAVENOUS

## 2014-06-14 MED ORDER — METRONIDAZOLE 500 MG PO TABS
500.0000 mg | ORAL_TABLET | Freq: Three times a day (TID) | ORAL | Status: DC
  Administered 2014-06-14 – 2014-06-15 (×3): 500 mg via ORAL
  Filled 2014-06-14 (×7): qty 1

## 2014-06-14 MED ORDER — SODIUM CHLORIDE 0.9 % IV SOLN: INTRAVENOUS | Status: DC

## 2014-06-14 MED ORDER — LOPERAMIDE HCL 2 MG PO CAPS
4.0000 mg | ORAL_CAPSULE | Freq: Once | ORAL | Status: AC
  Administered 2014-06-14: 4 mg via ORAL
  Filled 2014-06-14: qty 2

## 2014-06-14 MED ORDER — ACETAMINOPHEN 325 MG PO TABS: 325.0000 mg | ORAL_TABLET | ORAL | Status: DC | PRN

## 2014-06-14 MED ORDER — ASPIRIN 325 MG PO TABS
325.0000 mg | ORAL_TABLET | Freq: Two times a day (BID) | ORAL | Status: DC
  Administered 2014-06-14 – 2014-07-03 (×39): 325 mg via ORAL
  Filled 2014-06-14 (×41): qty 1

## 2014-06-14 MED ORDER — NICOTINE 21 MG/24HR TD PT24
21.0000 mg | MEDICATED_PATCH | Freq: Every day | TRANSDERMAL | Status: DC
  Administered 2014-06-14 – 2014-07-03 (×20): 21 mg via TRANSDERMAL
  Filled 2014-06-14 (×20): qty 1

## 2014-06-14 MED ORDER — VITAMIN D3 25 MCG (1000 UNIT) PO TABS
1000.0000 [IU] | ORAL_TABLET | Freq: Every day | ORAL | Status: DC
  Administered 2014-06-14 – 2014-07-03 (×20): 1000 [IU] via ORAL
  Filled 2014-06-14 (×20): qty 1

## 2014-06-14 MED ORDER — HEPARIN SODIUM (PORCINE) 5000 UNIT/ML IJ SOLN
5000.0000 [IU] | Freq: Three times a day (TID) | INTRAMUSCULAR | Status: DC
  Administered 2014-06-14 – 2014-07-03 (×58): 5000 [IU] via SUBCUTANEOUS
  Filled 2014-06-14 (×63): qty 1

## 2014-06-14 MED ORDER — METRONIDAZOLE IN NACL 5-0.79 MG/ML-% IV SOLN
500.0000 mg | Freq: Three times a day (TID) | INTRAVENOUS | Status: DC
  Administered 2014-06-14: 500 mg via INTRAVENOUS
  Filled 2014-06-14 (×3): qty 100

## 2014-06-14 MED ORDER — OXYCODONE HCL ER 15 MG PO T12A
15.0000 mg | EXTENDED_RELEASE_TABLET | Freq: Two times a day (BID) | ORAL | Status: DC
  Administered 2014-06-14 – 2014-07-03 (×35): 15 mg via ORAL
  Filled 2014-06-14 (×39): qty 1

## 2014-06-14 MED ORDER — PHENYTOIN SODIUM EXTENDED 100 MG PO CAPS
200.0000 mg | ORAL_CAPSULE | Freq: Two times a day (BID) | ORAL | Status: DC
  Administered 2014-06-14 – 2014-07-03 (×39): 200 mg via ORAL
  Filled 2014-06-14 (×40): qty 2

## 2014-06-14 NOTE — H&P (Addendum)
Hospitalist Admission History and Physical  Patient name: Joshua Reed Medical record number: [id number] Date of birth: 11-20-1945 Age: 68 y.o. Gender: male  Primary Care Provider: Gennette Pac, MD  Chief Complaint: diarrhea, metabolic acidosis   History of Present Illness:This is a 68 y.o. year old male with significant past medical history of COPD, ETOH abuse, seizure disorder, recent  R distal radius and right hip fracture s/p fall  presenting with diarrhea. Patient noted to have been recently discharged from short-term rehabilitation. Was a short-term rehabilitation from November 9 with November 17. Per the patient and wife, patient has had persistent watery diarrhea since leaving the hospital. Symptoms started approximately 7 days ago. Has had recurrent episodes of voluminous watery diarrhea. Nonbloody. Nonbilious. Denies any abdominal pain. States that he has not had nausea but feels that he has had a decreased appetite. No fevers or chills. Currently denies any alcohol use. Has been using home Percocet with minimal improvement in diarrhea. Saw his PCP within the past 3-5 days for symptoms. Was given Lomotil. No improvement in symptoms. Denies any recent antibiotic use. Patient states he had multiple episodes of watery diarrhea today. Felt weak and dizzy which is what prompted patient to come to the hospital. Had colonoscopy w/ [facility] last year that was normal per pt.   On presentation to the ER, MAXIMUM TEMPERATURE 97.5, heart rate in the 60s to 70s, respirations in the tens, blood pressure in the 110s. Satting greater than 97% on room air. White blood cell count 13.7, hemoglobin 13.1, creatinine 0.88, bicarbonate 18. Anion gap of 13. CT of the abdomen and pelvis shows no bowel wall thickening. 6 x 4 cm postoperative right gluteal hematoma. There is scarring/ macroscopic fibrosis. Orthostatic positive in ER despite NS bolus per report.  Assessment and Plan: Joshua Reed is a 68  y.o. year old male presenting with diarrhea, AGMA   Active Problems:   Diarrhea   Metabolic acidosis   1- Diarrhea -empiric GI coverage w/ cipro and flagyl  -stool culture, c diff, fecal lactoferrin, O and P  -GI consult in am -trend WBC    2- AGMA -likely secondary to above  -AG 13; mild  -check lactate, ETOH  -hydrate -follow   3- Hyponatremia -chronic issue in setting of ETOH use (though pt denies)-mild -hypovolemic given above -gently hydrate  -follow   4- ETOH use -currently denies -ETOH level pending -CIWA   5- COPD  -stable  -no resp distress/cough/wheezing  6- R distal radius/R hip fx  -stable  -follow   FEN/GI: NPO  Prophylaxis: sub heparin  Disposition: pending further evaluation  Code Status:Full Code    Patient Active Problem List   Diagnosis Date Noted  . Diarrhea 06/14/2014  . Trauma 05/29/2014  . Distal radius fracture, right 05/29/2014  . Intertrochanteric fracture of right hip 05/29/2014  . Malnutrition of moderate degree 05/25/2014  . Femur fracture, right 05/24/2014  . Right wrist fracture 05/24/2014  . Hip fracture 05/24/2014  . Seizure disorder   . COPD (chronic obstructive pulmonary disease)   . Alcohol abuse   . Vitamin D deficiency   . Macular degeneration   . Fall from ladder   . Right hip pain    Past Medical History: Past Medical History  Diagnosis Date  . Seizure disorder 2205    ALCOHOL RELATED  . Macular degeneration   . COPD (chronic obstructive pulmonary disease)   . Alcohol abuse   . Vitamin D deficiency  Past Surgical History: Past Surgical History  Procedure Laterality Date  . Cardiac catheterization  2002    normal  . Open reduction internal fixation (orif) distal radial fracture Right 05/24/2014    Procedure: OPEN REDUCTION INTERNAL FIXATION (ORIF) DISTAL RADIAL FRACTURE;  Surgeon: Marianna Payment, MD;  Location: [facility];  Service: Orthopedics;  Laterality: Right;  . Intramedullary (im) nail  intertrochanteric Right 05/24/2014    Procedure: INTRAMEDULLARY (IM) NAIL INTERTROCHANTRIC;  Surgeon: Marianna Payment, MD;  Location: [facility];  Service: Orthopedics;  Laterality: Right;    Social History: History   Social History  . Marital Status: Married    Spouse Name: N/A    Number of Children: N/A  . Years of Education: N/A   Social History Main Topics  . Smoking status: Former Smoker    Types: Cigarettes    Quit date: 05/07/2011  . Smokeless tobacco: Never Used  . Alcohol Use: 2.4 oz/week    4 Cans of beer per week     Comment: quit 2-3  years ago  . Drug Use: No  . Sexual Activity: None   Other Topics Concern  . None   Social History Narrative    Family History: Family History  Problem Relation Age of Onset  . Heart attack Father     Allergies: Allergies  Allergen Reactions  . Acyclovir And Related Hives    Current Facility-Administered Medications  Medication Dose Route Frequency Provider Last Rate Last Dose  . 0.9 %  sodium chloride infusion  1,000 mL Intravenous Continuous Janice Norrie, MD 125 mL/hr at 06/13/14 2251 1,000 mL at 06/13/14 2251  . 0.9 %  sodium chloride infusion   Intravenous Continuous Shanda Howells, MD      . ciprofloxacin (CIPRO) IVPB 400 mg  400 mg Intravenous Q12H Shanda Howells, MD      . heparin injection 5,000 Units  5,000 Units Subcutaneous 3 times per day Shanda Howells, MD      . loperamide (IMODIUM) capsule 4 mg  4 mg Oral Once Kalman Drape, MD      . metroNIDAZOLE (FLAGYL) IVPB 500 mg  500 mg Intravenous Q8H Shanda Howells, MD       Current Outpatient Prescriptions  Medication Sig Dispense Refill  . acetaminophen (TYLENOL) 325 MG tablet Take 1-2 tablets (325-650 mg total) by mouth every 4 (four) hours as needed for mild pain.    Marland Kitchen aspirin 325 MG tablet Take 325 mg by mouth 2 (two) times daily.    . cholecalciferol (VITAMIN D) 1000 UNITS tablet Take 1,000 Units by mouth daily.    . Coenzyme Q10 (CO Q 10 PO) Take 1 tablet by mouth  daily.    . famotidine (PEPCID) 10 MG tablet Take 1 tablet (10 mg total) by mouth at bedtime. 30 tablet 0  . ferrous sulfate 325 (65 FE) MG tablet Take 1 tablet (325 mg total) by mouth 2 (two) times daily with a meal. 60 tablet 1  . methocarbamol (ROBAXIN) 500 MG tablet Take 1 tablet (500 mg total) by mouth every 8 (eight) hours as needed for muscle spasms. 30 tablet 0  . Multiple Vitamin (MULTIVITAMIN WITH MINERALS) TABS tablet Take 1 tablet by mouth daily.    . nicotine (NICODERM CQ - DOSED IN MG/24 HOURS) 21 mg/24hr patch Place 1 patch (21 mg total) onto the skin daily. 28 patch 0  . Omega-3 Fatty Acids (FISH OIL PO) Take 1 capsule by mouth daily.    . OxyCODONE (  OXYCONTIN) 15 mg T12A 12 hr tablet Take 1 tablet (15 mg total) by mouth every 12 (twelve) hours. One pill twice a day for 10 days. Then decrease to once a day till gone. 30 tablet 0  . oxyCODONE 10 MG TABS Take 1-1.5 tablets (10-15 mg total) by mouth every 6 (six) hours as needed. (Patient taking differently: Take 10-15 mg by mouth every 6 (six) hours as needed. For pain) 75 tablet 0  . phenytoin (DILANTIN) 100 MG ER capsule Take 200 mg by mouth 2 (two) times daily.    . protein supplement (RESOURCE BENEPROTEIN) POWD Take 6 g by mouth 3 (three) times daily with meals. 1 Can 0  . tiotropium (SPIRIVA) 18 MCG inhalation capsule Place 1 capsule (18 mcg total) into inhaler and inhale daily. 30 capsule 12  . phenytoin (DILANTIN) 200 MG ER capsule Take 1 capsule (200 mg total) by mouth 2 (two) times daily. (Patient not taking: Reported on 06/13/2014) 60 capsule 0   Review Of Systems: 12 point ROS negative except as noted above in HPI.  Physical Exam: Filed Vitals:   06/14/14 0133  BP: 111/59  Pulse: 69  Temp:   Resp: 16    General: alert and cooperative HEENT: PERRLA and extra ocular movement intact Heart: S1, S2 normal, no murmur, rub or gallop, regular rate and rhythm Lungs: clear to auscultation, no wheezes or rales and unlabored  breathing Abdomen: abdomen is soft without significant tenderness, masses, organomegaly or guarding Extremities: extremities normal, atraumatic, no cyanosis or edema Skin:no rashes, no ecchymoses Neurology: normal without focal findings  Labs and Imaging: Lab Results  Component Value Date/Time   NA 132* 06/13/2014 08:33 PM   K 4.5 06/13/2014 08:33 PM   CL 101 06/13/2014 08:33 PM   CO2 18* 06/13/2014 08:33 PM   BUN 14 06/13/2014 08:33 PM   CREATININE 0.88 06/13/2014 08:33 PM   GLUCOSE 122* 06/13/2014 08:33 PM   Lab Results  Component Value Date   WBC 13.7* 06/13/2014   HGB 13.1 06/13/2014   HCT 40.6 06/13/2014   MCV 91.9 06/13/2014   PLT 484* 06/13/2014    Ct Abdomen Pelvis W Contrast  06/14/2014   CLINICAL DATA:  Diffuse watery diarrhea with abdominal pain.  EXAM: CT ABDOMEN AND PELVIS WITH CONTRAST  TECHNIQUE: Multidetector CT imaging of the abdomen and pelvis was performed using the standard protocol following bolus administration of intravenous contrast.  CONTRAST:  136mL OMNIPAQUE IOHEXOL 300 MG/ML  SOLN  COMPARISON:  05/24/2014  FINDINGS: BODY WALL: There is a fluid collection within the right gluteal region which measures 6 x 4 cm in axial dimension. The collection is both subcutaneous and intramuscular and has central high density consistent with hematoma. There is mild peripheral enhancement. The patient has undergone recent right hip surgery for fracture.  LOWER CHEST: Unremarkable.  ABDOMEN/PELVIS:  Liver: Capsular distortion with bands of low-density in the anterior right Liver. No visible mass lesion. The main portal vein measures 18 mm.  Biliary: No evidence of biliary obstruction or stone.  Pancreas: Unremarkable.  Spleen: Unremarkable.  Adrenals: Unremarkable.  Kidneys and ureters: No hydronephrosis or stone.  Bladder: Unremarkable.  Reproductive: Unremarkable.  Bowel: Fluid levels with small and large bowel all the way to the rectum. No enteric or colonic wall thickening  or abnormal enhancement. There is enlargement of small bowel lymph nodes which may be reactive given these findings. Follow-up recommendations were provided on the comparison study. The largest nodes measure up to 9 mm in  short axis. Negative appendix.  Peritoneum: No ascites or pneumoperitoneum.  Vascular: Atherosclerosis of the aorta and branch vessels. No acute findings.  OSSEOUS: Interval right hip fixation. Imaged portions are unremarkable. No interval displacement of a inferior pubic ramus fracture on the right.  IMPRESSION: 1. Diarrheal illness without bowel wall thickening. 2. 6 x 4 cm postoperative right gluteal hematoma. 3. Liver scarring/macroscopic fibrosis.   Electronically Signed   By: Jorje Guild M.D.   On: 06/14/2014 00:42           Shanda Howells MD  Pager: [phone]

## 2014-06-14 NOTE — Progress Notes (Signed)
INITIAL NUTRITION ASSESSMENT  DOCUMENTATION CODES Per approved criteria  -Severe malnutrition in the context of acute illness or injury  Pt meets criteria for MALNUTRITION in the context of acute illness as evidenced by 13% weight loss in <1 month and reported intake <50% of estimated needs for > 1 week.  INTERVENTION: -Boost Plus TID  NUTRITION DIAGNOSIS: Inadequate oral intake related to diarrhea as evidenced by wt loss and poor po.   Goal: Pt to meet >/= 90% of their estimated nutrition needs   Monitor:  Weight trend, po intake, acceptance of supplements, labs  Reason for Assessment: MST  68 y.o. male  Admitting Dx: <principal problem not specified>  ASSESSMENT: 68 y.o. year old male with significant past medical history of COPD, ETOH abuse, seizure disorder, recent R distal radius and right hip fracture s/p fall presenting with diarrhea.   - Pt with 19 lb wt loss in the past month. He reports a poor appetite for the past week. Pt agreed to try nutritional supplements.  - Pt's diet upgraded to regular diet.   Nutrition Focused Physical Exam:  Subcutaneous Fat:  Orbital Region: moderate wasting Upper Arm Region: mild wasting Thoracic and Lumbar Region: moderate wasting  Muscle:  Temple Region: moderate wasting Clavicle Bone Region: moderate wasting Clavicle and Acromion Bone Region: moderate wasting Scapular Bone Region: n/a Dorsal Hand: mild wasting Patellar Region: mild wasting Anterior Thigh Region: WNL Posterior Calf Region: WNL  Edema: none   Height: Ht Readings from Last 1 Encounters:  06/14/14 5\' 8"  (1.727 m)    Weight: Wt Readings from Last 1 Encounters:  06/14/14 125 lb 7.1 oz (56.9 kg)    Ideal Body Weight: 68.4 kg  % Ideal Body Weight: 83%  Wt Readings from Last 10 Encounters:  06/14/14 125 lb 7.1 oz (56.9 kg)  06/05/14 133 lb 9.6 oz (60.6 kg)  05/29/14 144 lb 6.4 oz (65.5 kg)    Usual Body Weight: 144 lbs  % Usual Body Weight:  87%  BMI:  Body mass index is 19.08 kg/(m^2).  Estimated Nutritional Needs: Kcal: 1500-1700 Protein: 85-95 g Fluid: >1.7 L/day  Skin: intact  Diet Order: Diet regular  EDUCATION NEEDS: -Education needs addressed   Intake/Output Summary (Last 24 hours) at 06/14/14 1219 Last data filed at 06/14/14 0900  Gross per 24 hour  Intake      0 ml  Output    150 ml  Net   -150 ml    Last BM: 11/25   Labs:   Recent Labs Lab 06/13/14 2033 06/14/14 0512  NA 132* 134*  K 4.5 4.1  CL 101 103  CO2 18* 18*  BUN 14 12  CREATININE 0.88 0.78  CALCIUM 8.8 8.2*  GLUCOSE 122* 95    CBG (last 3)  No results for input(s): GLUCAP in the last 72 hours.  Scheduled Meds: . aspirin  325 mg Oral BID  . cholecalciferol  1,000 Units Oral Daily  . heparin  5,000 Units Subcutaneous 3 times per day  . metroNIDAZOLE  500 mg Oral 3 times per day  . nicotine  21 mg Transdermal Daily  . OxyCODONE  15 mg Oral Q12H  . phenytoin  200 mg Oral BID  . protein supplement  6 g Oral TID WC  . tiotropium  18 mcg Inhalation Daily    Continuous Infusions: . sodium chloride 100 mL/hr at 06/14/14 0423    Past Medical History  Diagnosis Date  . Seizure disorder 2205    ALCOHOL  RELATED  . Macular degeneration   . COPD (chronic obstructive pulmonary disease)   . Alcohol abuse   . Vitamin D deficiency     Past Surgical History  Procedure Laterality Date  . Cardiac catheterization  2002    normal  . Open reduction internal fixation (orif) distal radial fracture Right 05/24/2014    Procedure: OPEN REDUCTION INTERNAL FIXATION (ORIF) DISTAL RADIAL FRACTURE;  Surgeon: Marianna Payment, MD;  Location: [facility];  Service: Orthopedics;  Laterality: Right;  . Intramedullary (im) nail intertrochanteric Right 05/24/2014    Procedure: INTRAMEDULLARY (IM) NAIL INTERTROCHANTRIC;  Surgeon: Marianna Payment, MD;  Location: [facility];  Service: Orthopedics;  Laterality: Right;    Laurette Schimke MS, RD, LDN

## 2014-06-14 NOTE — Progress Notes (Signed)
Patient seen and examined  several episodes today    1-  c diff Diarrhea DC iv / cipro and flagyl , started PO flagyl -stool culture, c diff, fecal lactoferrin, O and P  -trend WBC   2- AGMA -likely secondary to above  -AG 13; mild  -check lactate, ETOH  -hydrate -follow   3- Hyponatremia-improving  -chronic issue in setting of ETOH use (though pt denies)-mild -hypovolemic given above -gently hydrate  -follow   4- ETOH use -currently denies -CIWA   5- COPD  -stable  -no resp distress/cough/wheezing  6- R distal radius/R hip fx  -stable  -follow

## 2014-06-14 NOTE — Progress Notes (Signed)
UR completed 

## 2014-06-14 NOTE — Discharge Instructions (Signed)
Dehydration, Adult Dehydration is when you lose more fluids from the body than you take in. Vital organs like the kidneys, brain, and heart cannot function without a proper amount of fluids and salt. Any loss of fluids from the body can cause dehydration.  CAUSES   Vomiting.  Diarrhea.  Excessive sweating.  Excessive urine output.  Fever. SYMPTOMS  Mild dehydration  Thirst.  Dry lips.  Slightly dry mouth. Moderate dehydration  Very dry mouth.  Sunken eyes.  Skin does not bounce back quickly when lightly pinched and released.  Dark urine and decreased urine production.  Decreased tear production.  Headache. Severe dehydration  Very dry mouth.  Extreme thirst.  Rapid, weak pulse (more than 100 beats per minute at rest).  Cold hands and feet.  Not able to sweat in spite of heat and temperature.  Rapid breathing.  Blue lips.  Confusion and lethargy.  Difficulty being awakened.  Minimal urine production.  No tears. DIAGNOSIS  Your caregiver will diagnose dehydration based on your symptoms and your exam. Blood and urine tests will help confirm the diagnosis. The diagnostic evaluation should also identify the cause of dehydration. TREATMENT  Treatment of mild or moderate dehydration can often be done at home by increasing the amount of fluids that you drink. It is best to drink small amounts of fluid more often. Drinking too much at one time can make vomiting worse. Refer to the home care instructions below. Severe dehydration needs to be treated at the hospital where you will probably be given intravenous (IV) fluids that contain water and electrolytes. HOME CARE INSTRUCTIONS   Ask your caregiver about specific rehydration instructions.  Drink enough fluids to keep your urine clear or pale yellow.  Drink small amounts frequently if you have nausea and vomiting.  Eat as you normally do.  Avoid:  Foods or drinks high in sugar.  Carbonated  drinks.  Juice.  Extremely hot or cold fluids.  Drinks with caffeine.  Fatty, greasy foods.  Alcohol.  Tobacco.  Overeating.  Gelatin desserts.  Wash your hands well to avoid spreading bacteria and viruses.  Only take over-the-counter or prescription medicines for pain, discomfort, or fever as directed by your caregiver.  Ask your caregiver if you should continue all prescribed and over-the-counter medicines.  Keep all follow-up appointments with your caregiver. SEEK MEDICAL CARE IF:  You have abdominal pain and it increases or stays in one area (localizes).  You have a rash, stiff neck, or severe headache.  You are irritable, sleepy, or difficult to awaken.  You are weak, dizzy, or extremely thirsty. SEEK IMMEDIATE MEDICAL CARE IF:   You are unable to keep fluids down or you get worse despite treatment.  You have frequent episodes of vomiting or diarrhea.  You have blood or green matter (bile) in your vomit.  You have blood in your stool or your stool looks black and tarry.  You have not urinated in 6 to 8 hours, or you have only urinated a small amount of very dark urine.  You have a fever.  You faint. MAKE SURE YOU:   Understand these instructions.  Will watch your condition.  Will get help right away if you are not doing well or get worse. Document Released: 07/07/2005 Document Revised: 09/29/2011 Document Reviewed: 02/24/2011 [facility] Patient Information 2015 [place], [place]. This information is not intended to replace advice given to you by your health care provider. Make sure you discuss any questions you have with your health care  provider.  Diarrhea Diarrhea is frequent loose and watery bowel movements. It can cause you to feel weak and dehydrated. Dehydration can cause you to become tired and thirsty, have a dry mouth, and have decreased urination that often is dark yellow. Diarrhea is a sign of another problem, most often an infection that will  not last long. In most cases, diarrhea typically lasts 2-3 days. However, it can last longer if it is a sign of something more serious. It is important to treat your diarrhea as directed by your caregiver to lessen or prevent future episodes of diarrhea. CAUSES  Some common causes include:  Gastrointestinal infections caused by viruses, bacteria, or parasites.  Food poisoning or food allergies.  Certain medicines, such as antibiotics, chemotherapy, and laxatives.  Artificial sweeteners and fructose.  Digestive disorders. HOME CARE INSTRUCTIONS  Ensure adequate fluid intake (hydration): Have 1 cup (8 oz) of fluid for each diarrhea episode. Avoid fluids that contain simple sugars or sports drinks, fruit juices, whole milk products, and sodas. Your urine should be clear or pale yellow if you are drinking enough fluids. Hydrate with an oral rehydration solution that you can purchase at pharmacies, retail stores, and online. You can prepare an oral rehydration solution at home by mixing the following ingredients together:   - tsp table salt.   tsp baking soda.   tsp salt substitute containing potassium chloride.  1  tablespoons sugar.  1 L (34 oz) of water.  Certain foods and beverages may increase the speed at which food moves through the gastrointestinal (GI) tract. These foods and beverages should be avoided and include:  Caffeinated and alcoholic beverages.  High-fiber foods, such as raw fruits and vegetables, nuts, seeds, and whole grain breads and cereals.  Foods and beverages sweetened with sugar alcohols, such as xylitol, sorbitol, and mannitol.  Some foods may be well tolerated and may help thicken stool including:  Starchy foods, such as rice, toast, pasta, low-sugar cereal, oatmeal, grits, baked potatoes, crackers, and bagels.  Bananas.  Applesauce.  Add probiotic-rich foods to help increase healthy bacteria in the GI tract, such as yogurt and fermented milk  products.  Wash your hands well after each diarrhea episode.  Only take over-the-counter or prescription medicines as directed by your caregiver.  Take a warm bath to relieve any burning or pain from frequent diarrhea episodes. SEEK IMMEDIATE MEDICAL CARE IF:   You are unable to keep fluids down.  You have persistent vomiting.  You have blood in your stool, or your stools are black and tarry.  You do not urinate in 6-8 hours, or there is only a small amount of very dark urine.  You have abdominal pain that increases or localizes.  You have weakness, dizziness, confusion, or light-headedness.  You have a severe headache.  Your diarrhea gets worse or does not get better.  You have a fever or persistent symptoms for more than 2-3 days.  You have a fever and your symptoms suddenly get worse. MAKE SURE YOU:   Understand these instructions.  Will watch your condition.  Will get help right away if you are not doing well or get worse. Document Released: 06/27/2002 Document Revised: 11/21/2013 Document Reviewed: 03/14/2012 [facility] Patient Information 2015 [place], [place]. This information is not intended to replace advice given to you by your health care provider. Make sure you discuss any questions you have with your health care provider.  Rehydration, Adult Rehydration is the replacement of body fluids lost during dehydration. Dehydration  is an extreme loss of body fluids to the point of body function impairment. There are many ways extreme fluid loss can occur, including vomiting, diarrhea, or excess sweating. Recovering from dehydration requires replacing lost fluids, continuing to eat to maintain strength, and avoiding foods and beverages that may contribute to further fluid loss or may increase nausea. HOW TO REHYDRATE In most cases, rehydration involves the replacement of not only fluids but also carbohydrates and basic body salts. Rehydration with an oral rehydration solution  is one way to replace essential nutrients lost through dehydration. An oral rehydration solution can be purchased at pharmacies, retail stores, and online. Premixed packets of powder that you combine with water to make a solution are also sold. You can prepare an oral rehydration solution at home by mixing the following ingredients together:    - tsp table salt.   tsp baking soda.   tsp salt substitute containing potassium chloride.  1 tablespoons sugar.  1 L (34 oz) of water. Be sure to use exact measurements. Including too much sugar can make diarrhea worse. Drink -1 cup (120-240 mL) of oral rehydration solution each time you have diarrhea or vomit. If drinking this amount makes your vomiting worse, try drinking smaller amounts more often. For example, drink 1-3 tsp every 5-10 minutes.  A general rule for staying hydrated is to drink 1-2 L of fluid per day. Talk to your caregiver about the specific amount you should be drinking each day. Drink enough fluids to keep your urine clear or pale yellow. EATING WHEN DEHYDRATED Even if you have had severe sweating or you are having diarrhea, do not stop eating. Many healthy items in a normal diet are okay to continue eating while recovering from dehydration. The following tips can help you to lessen nausea when you eat:  Ask someone else to prepare your food. Cooking smells may worsen nausea.  Eat in a well-ventilated room away from cooking smells.  Sit up when you eat. Avoid lying down until 1-2 hours after eating.  Eat small amounts when you eat.  Eat foods that are easy to digest. These include soft, well-cooked, or mashed foods. FOODS AND BEVERAGES TO AVOID Avoid eating or drinking the following foods and beverages that may increase nausea or further loss of fluid:   Fruit juices with a high sugar content, such as concentrated juices.  Alcohol.  Beverages containing caffeine.  Carbonated drinks. They may cause a lot of  gas.  Foods that may cause a lot of gas, such as cabbage, broccoli, and beans.  Fatty, greasy, and fried foods.  Spicy, very salty, and very sweet foods or drinks.  Foods or drinks that are very hot or very cold. Consume food or drinks at or near room temperature.  Foods that need a lot of chewing, such as raw vegetables.  Foods that are sticky or hard to swallow, such as peanut butter. Document Released: 09/29/2011 Document Revised: 03/31/2012 Document Reviewed: 09/29/2011 [facility] Patient Information 2015 [place], [place]. This information is not intended to replace advice given to you by your health care provider. Make sure you discuss any questions you have with your health care provider.

## 2014-06-14 NOTE — ED Provider Notes (Signed)
Care assumed from Dr. Tomi Bamberger awaiting CT scan.  Pt with multiple loose stools since d/c from hospital last week.    Results for orders placed or performed during the hospital encounter of 06/13/14  Urinalysis, Routine w reflex microscopic  Result Value Ref Range   Color, Urine ORANGE (A) YELLOW   APPearance CLOUDY (A) CLEAR   Specific Gravity, Urine 1.031 (H) 1.005 - 1.030   pH 5.5 5.0 - 8.0   Glucose, UA NEGATIVE NEGATIVE mg/dL   Hgb urine dipstick NEGATIVE NEGATIVE   Bilirubin Urine SMALL (A) NEGATIVE   Ketones, ur NEGATIVE NEGATIVE mg/dL   Protein, ur 30 (A) NEGATIVE mg/dL   Urobilinogen, UA 0.2 0.0 - 1.0 mg/dL   Nitrite NEGATIVE NEGATIVE   Leukocytes, UA NEGATIVE NEGATIVE  CBC with Differential  Result Value Ref Range   WBC 13.7 (H) 4.0 - 10.5 K/uL   RBC 4.42 4.22 - 5.81 MIL/uL   Hemoglobin 13.1 13.0 - 17.0 g/dL   HCT 40.6 39.0 - 52.0 %   MCV 91.9 78.0 - 100.0 fL   MCH 29.6 26.0 - 34.0 pg   MCHC 32.3 30.0 - 36.0 g/dL   RDW 17.9 (H) 11.5 - 15.5 %   Platelets 484 (H) 150 - 400 K/uL   Neutrophils Relative % 87 (H) 43 - 77 %   Neutro Abs 12.0 (H) 1.7 - 7.7 K/uL   Lymphocytes Relative 4 (L) 12 - 46 %   Lymphs Abs 0.5 (L) 0.7 - 4.0 K/uL   Monocytes Relative 9 3 - 12 %   Monocytes Absolute 1.2 (H) 0.1 - 1.0 K/uL   Eosinophils Relative 0 0 - 5 %   Eosinophils Absolute 0.0 0.0 - 0.7 K/uL   Basophils Relative 0 0 - 1 %   Basophils Absolute 0.0 0.0 - 0.1 K/uL  Comprehensive metabolic panel  Result Value Ref Range   Sodium 132 (L) 137 - 147 mEq/L   Potassium 4.5 3.7 - 5.3 mEq/L   Chloride 101 96 - 112 mEq/L   CO2 18 (L) 19 - 32 mEq/L   Glucose, Bld 122 (H) 70 - 99 mg/dL   BUN 14 6 - 23 mg/dL   Creatinine, Ser 0.88 0.50 - 1.35 mg/dL   Calcium 8.8 8.4 - 10.5 mg/dL   Total Protein 7.0 6.0 - 8.3 g/dL   Albumin 3.1 (L) 3.5 - 5.2 g/dL   AST 31 0 - 37 U/L   ALT 21 0 - 53 U/L   Alkaline Phosphatase 189 (H) 39 - 117 U/L   Total Bilirubin 0.6 0.3 - 1.2 mg/dL   GFR calc non Af Amer  87 (L) >90 mL/min   GFR calc Af Amer >90 >90 mL/min   Anion gap 13 5 - 15  Urine microscopic-add on  Result Value Ref Range   WBC, UA 3-6 <3 WBC/hpf   Casts HYALINE CASTS (A) NEGATIVE   Crystals CA OXALATE CRYSTALS (A) NEGATIVE   Dg Wrist 2 Views Right  05/24/2014   CLINICAL DATA:  Fracture the distal radius and ulna, closed. Initially counter. Fluoroscopy time 16.3 seconds.  EXAM: RIGHT WRIST - 2 VIEW; DG C-ARM 61-120 MIN  COMPARISON:  New 05/24/2014  FINDINGS: The patient has undergone screw plate fixation of the distal radius. Distal ulnar fracture is again noted.  IMPRESSION: Interval ORIF of the distal radius.   Electronically Signed   By: Shon Hale M.D.   On: 05/24/2014 22:48   Dg Wrist 2 Views Right  05/24/2014  CLINICAL DATA:  Post reduction wrist fracture.  EXAM: RIGHT WRIST - 2 VIEW  COMPARISON:  05/24/2014  FINDINGS: Wrist is imaged through splinting material. There are comminuted fractures of the distal radius and ulna. There has been interval reduction with significant improvement in the angulation of the radiocarpal joint. Intercarpal spaces are grossly normal. Nondisplaced carpal fractures would be difficult to exclude but are not identified.  IMPRESSION: 1. Interval reduction of wrist fractures. 2. Significantly improved radiocarpal angulation.   Electronically Signed   By: Shon Hale M.D.   On: 05/24/2014 18:41   Dg Wrist 2 Views Right  05/24/2014   CLINICAL DATA:  [place] off ladder today from 10 feet. Deformity of right wrist. Right wrist pain. Initial encounter.  EXAM: RIGHT WRIST - 2 VIEW  COMPARISON:  None.  FINDINGS: Study is limited due to patient condition and limited positioning. Comminuted fracture noted through the distal right radius with impaction. No definite dislocation. Ulnar styloid fracture. Diffuse soft tissue swelling noted.  IMPRESSION: Comminuted, impacted distal right radial fracture. Distal ulnar/ulnar styloid fracture.   Electronically Signed   By: Rolm Baptise M.D.   On: 05/24/2014 15:49   Dg Femur Right  05/24/2014   CLINICAL DATA:  Right femoral intertrochanteric fracture.  EXAM: DG C-ARM 61-120 MIN; RIGHT FEMUR - 2 VIEW  COMPARISON:  CT 05/24/2014  FINDINGS: Intraoperative images demonstrate surgical fixation of the intertrochanteric fracture with a dynamic hip screw. Intramedullary nail extends into the distal femur. The right hip is located.  IMPRESSION: Internal fixation of the right femoral intertrochanteric fracture.   Electronically Signed   By: Markus Daft M.D.   On: 05/24/2014 22:43   Ct Chest W Contrast  05/24/2014   CLINICAL DATA:  Trauma with fall from ladder 6-8 feet. Unable to raise arms above head.  EXAM: CT CHEST, ABDOMEN, AND PELVIS WITH CONTRAST  TECHNIQUE: Multidetector CT imaging of the chest, abdomen and pelvis was performed following the standard protocol during bolus administration of intravenous contrast.  CONTRAST:  38mL OMNIPAQUE IOHEXOL 300 MG/ML  SOLN  COMPARISON:  CT chest 01/19/2014  FINDINGS: CT CHEST FINDINGS  The lungs are well inflated without consolidation or effusion. There is no pneumothorax. There is mild centrilobular emphysematous disease. Airways are within normal. Heart is normal in size. There is minimal calcified plaque over the thoracic aorta. Remaining mediastinal structures are unremarkable.  CT ABDOMEN AND PELVIS FINDINGS  The exam demonstrates a normal spleen, pancreas, gallbladder and adrenal glands. There is minimal stable atrophy over the anterior right lobe of the liver as a liver is otherwise unremarkable. Kidneys are within normal. There is calcified plaque over the abdominal aorta. There is no free fluid or free peritoneal air. Appendix is not seen. There is a slight increased number of small mesenteric lymph nodes. There is a 1.2 cm oval low-density focus within the mesentery of the mid abdomen left of midline just below the aortic bifurcation which may represent a small mesenteric cyst or low-density  lymph node.  Pelvic images demonstrate mild bladder distention. Prostate and rectum are within normal. No free fluid.  There is a displaced intertrochanteric fracture of the right hip. There is a subtle nondisplaced fracture of the right inferior pubic ramus. Remaining bones soft tissues are unremarkable.  IMPRESSION: Displaced right intertrochanteric hip fracture. Nondisplaced fracture of the right inferior pubic ramus.  No acute findings within the chest. No acute intra-abdominal findings.  Minimal nonspecific increased number of small mesenteric lymph nodes. Recommend followup abdominal  pelvic CT 3 months.  Minimal centrilobular emphysematous disease.  These results were called by telephone at the time of interpretation on 05/24/2014 at 5:28 pm to Dr. Ermalinda Memos, who verbally acknowledged these results.   Electronically Signed   By: Marin Olp M.D.   On: 05/24/2014 17:28   Ct Abdomen Pelvis W Contrast  06/14/2014   CLINICAL DATA:  Diffuse watery diarrhea with abdominal pain.  EXAM: CT ABDOMEN AND PELVIS WITH CONTRAST  TECHNIQUE: Multidetector CT imaging of the abdomen and pelvis was performed using the standard protocol following bolus administration of intravenous contrast.  CONTRAST:  149mL OMNIPAQUE IOHEXOL 300 MG/ML  SOLN  COMPARISON:  05/24/2014  FINDINGS: BODY WALL: There is a fluid collection within the right gluteal region which measures 6 x 4 cm in axial dimension. The collection is both subcutaneous and intramuscular and has central high density consistent with hematoma. There is mild peripheral enhancement. The patient has undergone recent right hip surgery for fracture.  LOWER CHEST: Unremarkable.  ABDOMEN/PELVIS:  Liver: Capsular distortion with bands of low-density in the anterior right Liver. No visible mass lesion. The main portal vein measures 18 mm.  Biliary: No evidence of biliary obstruction or stone.  Pancreas: Unremarkable.  Spleen: Unremarkable.  Adrenals: Unremarkable.  Kidneys and  ureters: No hydronephrosis or stone.  Bladder: Unremarkable.  Reproductive: Unremarkable.  Bowel: Fluid levels with small and large bowel all the way to the rectum. No enteric or colonic wall thickening or abnormal enhancement. There is enlargement of small bowel lymph nodes which may be reactive given these findings. Follow-up recommendations were provided on the comparison study. The largest nodes measure up to 9 mm in short axis. Negative appendix.  Peritoneum: No ascites or pneumoperitoneum.  Vascular: Atherosclerosis of the aorta and branch vessels. No acute findings.  OSSEOUS: Interval right hip fixation. Imaged portions are unremarkable. No interval displacement of a inferior pubic ramus fracture on the right.  IMPRESSION: 1. Diarrheal illness without bowel wall thickening. 2. 6 x 4 cm postoperative right gluteal hematoma. 3. Liver scarring/macroscopic fibrosis.   Electronically Signed   By: Jorje Guild M.D.   On: 06/14/2014 00:42   Ct Abdomen Pelvis W Contrast  05/24/2014   CLINICAL DATA:  Trauma with fall from ladder 6-8 feet. Unable to raise arms above head.  EXAM: CT CHEST, ABDOMEN, AND PELVIS WITH CONTRAST  TECHNIQUE: Multidetector CT imaging of the chest, abdomen and pelvis was performed following the standard protocol during bolus administration of intravenous contrast.  CONTRAST:  64mL OMNIPAQUE IOHEXOL 300 MG/ML  SOLN  COMPARISON:  CT chest 01/19/2014  FINDINGS: CT CHEST FINDINGS  The lungs are well inflated without consolidation or effusion. There is no pneumothorax. There is mild centrilobular emphysematous disease. Airways are within normal. Heart is normal in size. There is minimal calcified plaque over the thoracic aorta. Remaining mediastinal structures are unremarkable.  CT ABDOMEN AND PELVIS FINDINGS  The exam demonstrates a normal spleen, pancreas, gallbladder and adrenal glands. There is minimal stable atrophy over the anterior right lobe of the liver as a liver is otherwise  unremarkable. Kidneys are within normal. There is calcified plaque over the abdominal aorta. There is no free fluid or free peritoneal air. Appendix is not seen. There is a slight increased number of small mesenteric lymph nodes. There is a 1.2 cm oval low-density focus within the mesentery of the mid abdomen left of midline just below the aortic bifurcation which may represent a small mesenteric cyst or low-density lymph node.  Pelvic images demonstrate mild bladder distention. Prostate and rectum are within normal. No free fluid.  There is a displaced intertrochanteric fracture of the right hip. There is a subtle nondisplaced fracture of the right inferior pubic ramus. Remaining bones soft tissues are unremarkable.  IMPRESSION: Displaced right intertrochanteric hip fracture. Nondisplaced fracture of the right inferior pubic ramus.  No acute findings within the chest. No acute intra-abdominal findings.  Minimal nonspecific increased number of small mesenteric lymph nodes. Recommend followup abdominal pelvic CT 3 months.  Minimal centrilobular emphysematous disease.  These results were called by telephone at the time of interpretation on 05/24/2014 at 5:28 pm to Dr. Ermalinda Memos, who verbally acknowledged these results.   Electronically Signed   By: Marin Olp M.D.   On: 05/24/2014 17:28   Dg Pelvis Portable  05/24/2014   CLINICAL DATA:  Patient fell 8 feet off a ladder with right hip region pain  EXAM: PORTABLE PELVIS 1-2 VIEWS  COMPARISON:  None.  FINDINGS: There is an intertrochanteric femur fracture on the right with mild varus angulation at the fracture site. Fracture extends into the greater trochanter region or fracture is comminuted. No other fractures. No dislocation. Joint spaces appear intact.  IMPRESSION: Comminuted intertrochanteric femur fracture on the right with mild varus angulation at the fracture site. No dislocation seen on this single view.   Electronically Signed   By: Lowella Grip M.D.    On: 05/24/2014 15:03   Ct 3d Recon At Scanner  05/25/2014   CLINICAL DATA:  Nonspecific (abnormal) findings on radiological and other examination of musculoskeletal system. Status post fall from the ladder, 6-8 feet. Right intertrochanteric fracture secondary to the fall.  EXAM: 3-DIMENSIONAL CT IMAGE RENDERING ON ACQUISITION WORKSTATION  TECHNIQUE: 3-dimensional CT images were rendered by post-processing of the original CT data on an acquisition workstation. The 3-dimensional CT images were interpreted and findings were reported in the accompanying complete CT report for this study  COMPARISON:  CT chest, abdomen and pelvis 05/24/2014 at 16:51 hr  FINDINGS: As seen on the comparison examination, the patient has a right hip fracture. The fracture extends from the greater trochanter along the intertrochanteric ridge to the superior margin of the lesser trochanter. Nondisplaced right inferior pubic ramus fracture seen on CT scan is not well demonstrated on these images.  IMPRESSION: Acute right intertrochanteric fracture.  Nondisplaced right inferior pubic ramus fracture is better demonstrated on the comparison exam.   Electronically Signed   By: Inge Rise M.D.   On: 05/25/2014 07:12   Dg Chest Portable 1 View  05/24/2014   CLINICAL DATA:  Fall 6-8 feet from ladder today. Generalized right pain. Initial encounter  EXAM: PORTABLE CHEST - 1 VIEW  COMPARISON:  CT 01/19/2014  FINDINGS: Mild hyperinflation/COPD. Lungs are clear. No pneumothorax or effusion. Heart is normal size. No visible rib fracture or other acute bony abnormality.  IMPRESSION: COPD.  No active disease.   Electronically Signed   By: Rolm Baptise M.D.   On: 05/24/2014 15:03   Dg C-arm 1-60 Min  05/24/2014   CLINICAL DATA:  Fracture the distal radius and ulna, closed. Initially counter. Fluoroscopy time 16.3 seconds.  EXAM: RIGHT WRIST - 2 VIEW; DG C-ARM 61-120 MIN  COMPARISON:  New 05/24/2014  FINDINGS: The patient has undergone screw  plate fixation of the distal radius. Distal ulnar fracture is again noted.  IMPRESSION: Interval ORIF of the distal radius.   Electronically Signed   By: Shon Hale M.D.   On:  05/24/2014 22:48   Dg C-arm 1-60 Min  05/24/2014   CLINICAL DATA:  Right femoral intertrochanteric fracture.  EXAM: DG C-ARM 61-120 MIN; RIGHT FEMUR - 2 VIEW  COMPARISON:  CT 05/24/2014  FINDINGS: Intraoperative images demonstrate surgical fixation of the intertrochanteric fracture with a dynamic hip screw. Intramedullary nail extends into the distal femur. The right hip is located.  IMPRESSION: Internal fixation of the right femoral intertrochanteric fracture.   Electronically Signed   By: Markus Daft M.D.   On: 05/24/2014 22:43     Diarrhea noted on CT scan.  Pt reports he is doing a bland diet and using imodium (took for one day) without improvement.  Cdiff sent off.  Initially orthostatic, but better after fluids.  Wife is concerned about going home with continued diarrhea, but patient wishes to go home.  Will prescribe imodium, .  cdiff pending.  2:40 AM Pt with persistent orthostatics despite fluids.  Will d/w hospitalist for admission.  Kalman Drape, MD 06/14/14 [phone]

## 2014-06-15 DIAGNOSIS — E43 Unspecified severe protein-calorie malnutrition: Secondary | ICD-10-CM | POA: Diagnosis present

## 2014-06-15 DIAGNOSIS — E871 Hypo-osmolality and hyponatremia: Secondary | ICD-10-CM

## 2014-06-15 DIAGNOSIS — J449 Chronic obstructive pulmonary disease, unspecified: Secondary | ICD-10-CM

## 2014-06-15 DIAGNOSIS — R197 Diarrhea, unspecified: Secondary | ICD-10-CM

## 2014-06-15 LAB — COMPREHENSIVE METABOLIC PANEL
ALT: 17 U/L (ref 0–53)
ANION GAP: 13 (ref 5–15)
AST: 25 U/L (ref 0–37)
Albumin: 2.7 g/dL — ABNORMAL LOW (ref 3.5–5.2)
Alkaline Phosphatase: 178 U/L — ABNORMAL HIGH (ref 39–117)
BILIRUBIN TOTAL: 0.3 mg/dL (ref 0.3–1.2)
BUN: 9 mg/dL (ref 6–23)
CO2: 14 meq/L — AB (ref 19–32)
CREATININE: 0.85 mg/dL (ref 0.50–1.35)
Calcium: 8.4 mg/dL (ref 8.4–10.5)
Chloride: 108 mEq/L (ref 96–112)
GFR calc Af Amer: >90 mL/min (ref >90)
GFR calc non Af Amer: 88 mL/min — ABNORMAL LOW (ref >90)
Glucose, Bld: 106 mg/dL — ABNORMAL HIGH (ref 70–99)
Potassium: 3.8 mEq/L (ref 3.7–5.3)
Sodium: 135 mEq/L — ABNORMAL LOW (ref 137–147)
Total Protein: 6.2 g/dL (ref 6.0–8.3)

## 2014-06-15 LAB — CBC WITH DIFFERENTIAL/PLATELET
Basophils Absolute: 0 10*3/uL (ref 0.0–0.1)
Basophils Relative: 0 % (ref 0–1)
EOS PCT: 0 % (ref 0–5)
Eosinophils Absolute: 0 10*3/uL (ref 0.0–0.7)
HEMATOCRIT: 35.6 % — AB (ref 39.0–52.0)
Hemoglobin: 11.3 g/dL — ABNORMAL LOW (ref 13.0–17.0)
LYMPHS PCT: 14 % (ref 12–46)
Lymphs Abs: 1.5 10*3/uL (ref 0.7–4.0)
MCH: 29.6 pg (ref 26.0–34.0)
MCHC: 31.7 g/dL (ref 30.0–36.0)
MCV: 93.2 fL (ref 78.0–100.0)
MONO ABS: 1.1 10*3/uL — AB (ref 0.1–1.0)
Monocytes Relative: 10 % (ref 3–12)
Neutro Abs: 8 10*3/uL — ABNORMAL HIGH (ref 1.7–7.7)
Neutrophils Relative %: 76 % (ref 43–77)
Platelets: 398 10*3/uL (ref 150–400)
RBC: 3.82 MIL/uL — ABNORMAL LOW (ref 4.22–5.81)
RDW: 18.2 % — ABNORMAL HIGH (ref 11.5–15.5)
WBC: 10.6 10*3/uL — ABNORMAL HIGH (ref 4.0–10.5)

## 2014-06-15 LAB — FECAL LACTOFERRIN, QUANT: Fecal Lactoferrin: POSITIVE

## 2014-06-15 MED ORDER — VANCOMYCIN 50 MG/ML ORAL SOLUTION
500.0000 mg | Freq: Four times a day (QID) | ORAL | Status: DC
  Administered 2014-06-15 – 2014-06-20 (×21): 500 mg via ORAL
  Filled 2014-06-15 (×24): qty 10

## 2014-06-15 MED ORDER — METRONIDAZOLE IN NACL 5-0.79 MG/ML-% IV SOLN
500.0000 mg | Freq: Three times a day (TID) | INTRAVENOUS | Status: DC
  Administered 2014-06-15 – 2014-06-18 (×9): 500 mg via INTRAVENOUS
  Filled 2014-06-15 (×10): qty 100

## 2014-06-15 MED ORDER — STERILE WATER FOR INJECTION IV SOLN
INTRAVENOUS | Status: DC
  Administered 2014-06-15 – 2014-06-19 (×6): via INTRAVENOUS
  Filled 2014-06-15 (×19): qty 9.7

## 2014-06-15 MED ORDER — SACCHAROMYCES BOULARDII 250 MG PO CAPS
250.0000 mg | ORAL_CAPSULE | Freq: Two times a day (BID) | ORAL | Status: DC
  Administered 2014-06-15 – 2014-07-03 (×37): 250 mg via ORAL
  Filled 2014-06-15 (×38): qty 1

## 2014-06-15 MED ORDER — POTASSIUM CHLORIDE IN NACL 20-0.9 MEQ/L-% IV SOLN: INTRAVENOUS | Status: DC

## 2014-06-15 MED ORDER — ONDANSETRON HCL 4 MG/2ML IJ SOLN
4.0000 mg | Freq: Four times a day (QID) | INTRAMUSCULAR | Status: DC | PRN
  Administered 2014-06-15: 4 mg via INTRAVENOUS
  Filled 2014-06-15: qty 2

## 2014-06-15 NOTE — Progress Notes (Signed)
Patient seen and examined   Subjective :stooling all night   History of Present Illness:This is a 68 y.o. year old male with significant past medical history of COPD, ETOH abuse, seizure disorder, recent R distal radius and right hip fracture s/p fall presenting with diarrhea. Patient noted to have been recently discharged from short-term rehabilitation. Was a short-term rehabilitation from November 9 with November 17. Per the patient and wife, patient has had persistent watery diarrhea since leaving the hospital. Symptoms started approximately 7 days ago. Has had recurrent episodes of voluminous watery diarrhea. Nonbloody. Nonbilious. Denies any abdominal pain. States that he has not had nausea but feels that he has had a decreased appetite. No fevers or chills. Currently denies any alcohol use. Has been using home Percocet with minimal improvement in diarrhea. Saw his PCP within the past 3-5 days for symptoms. Was given Lomotil. No improvement in symptoms. Denies any recent antibiotic use. Patient states he had multiple episodes of watery diarrhea today. Felt weak and dizzy which is what prompted patient to come to the hospital. Had colonoscopy w/ [facility] last year that was normal per pt.  several episodes today   General: alert and cooperative HEENT: PERRLA and extra ocular movement intact Heart: S1, S2 normal, no murmur, rub or gallop, regular rate and rhythm Lungs: clear to auscultation, no wheezes or rales and unlabored breathing Abdomen: abdomen is soft without significant tenderness, masses, organomegaly or guarding Extremities: extremities normal, atraumatic, no cyanosis or edema Skin:no rashes, no ecchymoses Neurology: normal without focal findings  1-  Severe c diff Diarrhea Change to iv flagyl , PO vanc  -stool culture, c diff, fecal lactoferrin, O and P  -trend WBC  Continue IVF   2- AGMA -likely secondary to above  Change ivf to ns with biacarb  3-  Hyponatremia-improving  -chronic issue in setting of ETOH use (though pt denies)-mild -hypovolemic given above -gently hydrate  -follow   4- ETOH use -currently denies -CIWA   5- COPD  -stable  -no resp distress/cough/wheezing  6- R distal radius/R hip fx  -stable  -follow

## 2014-06-16 DIAGNOSIS — K089 Disorder of teeth and supporting structures, unspecified: Secondary | ICD-10-CM | POA: Diagnosis present

## 2014-06-16 DIAGNOSIS — A047 Enterocolitis due to Clostridium difficile: Principal | ICD-10-CM

## 2014-06-16 LAB — CBC WITH DIFFERENTIAL/PLATELET
BASOS ABS: 0 10*3/uL (ref 0.0–0.1)
Basophils Relative: 0 % (ref 0–1)
EOS ABS: 0 10*3/uL (ref 0.0–0.7)
EOS PCT: 1 % (ref 0–5)
HCT: 35.6 % — ABNORMAL LOW (ref 39.0–52.0)
Hemoglobin: 11.4 g/dL — ABNORMAL LOW (ref 13.0–17.0)
LYMPHS PCT: 20 % (ref 12–46)
Lymphs Abs: 1.4 10*3/uL (ref 0.7–4.0)
MCH: 29.5 pg (ref 26.0–34.0)
MCHC: 32 g/dL (ref 30.0–36.0)
MCV: 92 fL (ref 78.0–100.0)
Monocytes Absolute: 0.9 10*3/uL (ref 0.1–1.0)
Monocytes Relative: 13 % — ABNORMAL HIGH (ref 3–12)
Neutro Abs: 4.6 10*3/uL (ref 1.7–7.7)
Neutrophils Relative %: 66 % (ref 43–77)
PLATELETS: 327 10*3/uL (ref 150–400)
RBC: 3.87 MIL/uL — ABNORMAL LOW (ref 4.22–5.81)
RDW: 18.3 % — AB (ref 11.5–15.5)
WBC: 7 10*3/uL (ref 4.0–10.5)

## 2014-06-16 LAB — COMPREHENSIVE METABOLIC PANEL
ALT: 15 U/L (ref 0–53)
AST: 25 U/L (ref 0–37)
Albumin: 2.6 g/dL — ABNORMAL LOW (ref 3.5–5.2)
Alkaline Phosphatase: 175 U/L — ABNORMAL HIGH (ref 39–117)
Anion gap: 12 (ref 5–15)
BUN: 6 mg/dL (ref 6–23)
CO2: 19 meq/L (ref 19–32)
Calcium: 8.3 mg/dL — ABNORMAL LOW (ref 8.4–10.5)
Chloride: 104 mEq/L (ref 96–112)
Creatinine, Ser: 0.91 mg/dL (ref 0.50–1.35)
GFR calc Af Amer: >90 mL/min (ref >90)
GFR calc non Af Amer: 86 mL/min — ABNORMAL LOW (ref >90)
Glucose, Bld: 96 mg/dL (ref 70–99)
Potassium: 3.6 mEq/L — ABNORMAL LOW (ref 3.7–5.3)
Sodium: 135 mEq/L — ABNORMAL LOW (ref 137–147)
TOTAL PROTEIN: 5.7 g/dL — AB (ref 6.0–8.3)
Total Bilirubin: 0.4 mg/dL (ref 0.3–1.2)

## 2014-06-16 LAB — OVA AND PARASITE EXAMINATION: Ova and parasites: NONE SEEN

## 2014-06-16 NOTE — Progress Notes (Signed)
Pt pleasant and cooperative with care. Continues to have loose and constant bowel movements. Will continue to asses

## 2014-06-16 NOTE — Progress Notes (Signed)
Patient seen and examined   Subjective :stooling all night   History of Present Illness:This is a 68 y.o. year old male with significant past medical history of COPD, ETOH abuse, seizure disorder, recent R distal radius and right hip fracture s/p fall presenting with diarrhea. Patient noted to have been recently discharged from short-term rehabilitation. Was a short-term rehabilitation from November 9 with November 17. Per the patient and wife, patient has had persistent watery diarrhea since leaving the hospital. Symptoms started approximately 7 days ago. Has had recurrent episodes of voluminous watery diarrhea. Nonbloody. Nonbilious. Denies any abdominal pain. States that he has not had nausea but feels that he has had a decreased appetite. No fevers or chills. Currently denies any alcohol use. Has been using home Percocet with minimal improvement in diarrhea. Saw his PCP within the past 3-5 days for symptoms. Was given Lomotil. No improvement in symptoms. Denies any recent antibiotic use. Patient states he had multiple episodes of watery diarrhea today. Felt weak and dizzy which is what prompted patient to come to the hospital. Had colonoscopy w/ [facility] last year that was normal per pt.  several episodes today   General: alert and cooperative HEENT: PERRLA and extra ocular movement intact Heart: S1, S2 normal, no murmur, rub or gallop, regular rate and rhythm Lungs: clear to auscultation, no wheezes or rales and unlabored breathing Abdomen: abdomen is soft without significant tenderness, masses, organomegaly or guarding Extremities: extremities normal, atraumatic, no cyanosis or edema Skin:no rashes, no ecchymoses Neurology: normal without focal findings  Subjective Patient does feel a little bit better today, however still continues to have diarrhea By mouth intake has increased since yesterday  Assessment and plan 1-  Severe c diff Diarrhea Change to iv flagyl , PO vanc , consulted  Dr. [name] for severe C. difficile -trend WBC  Continue IVF   2- anion gap metabolic acidosis-improving  -likely secondary to above  Continue IV fluid with bicarbonate  3- Hyponatremia-improving  -chronic issue in setting of ETOH use (though pt denies)-mild -hypovolemic given above -gently hydrate  -follow   4- ETOH use -currently denies -CIWA   5- COPD  -stable  -no resp distress/cough/wheezing  6- R distal radius/R hip fx  6 x 4 cm postoperative right gluteal hematoma., Not a concern in the setting of stable hemoglobin -follow

## 2014-06-16 NOTE — Consult Note (Signed)
[facility] for Infectious Disease    Date of Admission:  06/13/2014           Day 3 IV metronidazole        Day 2 oral vancomycin       Reason for Consult: C. difficile colitis    Referring Physician: Dr. Reyne Dumas Primary Care Physician: Dr. Hulan Fess  Active Problems:   C. difficile colitis   Seizure disorder   COPD (chronic obstructive pulmonary disease)   Alcohol abuse   Vitamin D deficiency   Macular degeneration   Distal radius fracture, right   Intertrochanteric fracture of right hip   Metabolic acidosis   Protein-calorie malnutrition, severe   Poor dentition   . aspirin  325 mg Oral BID  . cholecalciferol  1,000 Units Oral Daily  . heparin  5,000 Units Subcutaneous 3 times per day  . lactose free nutrition  237 mL Oral TID WC  . vancomycin  500 mg Oral 4 times per day   And  . metronidazole  500 mg Intravenous Q8H  . nicotine  21 mg Transdermal Daily  . OxyCODONE  15 mg Oral Q12H  . phenytoin  200 mg Oral BID  . protein supplement  6 g Oral TID WC  . saccharomyces boulardii  250 mg Oral BID  . tiotropium  18 mcg Inhalation Daily    Recommendations: 1. Continue current antimicrobial therapy   Assessment: He has C. difficile colitis following recent exposure to cefazolin. His diarrhea could've been worsened by his recent treatment with Lomotil and ciprofloxacin and exposure to oral contrast for his CT scan. He is now improving on IV metronidazole and oral vancomycin. If he continues to improve I would consider stopping IV metronidazole sewn in completing therapy as an outpatient with oral vancomycin for a 2 week course of therapy. Please call me for any questions this weekend.    HPI: Joshua Reed is a 68 y.o. male who fell off a ladder on November for sustaining right wrist and right hip fractures. He received a prophylactic dose of cefazolin prior to open reduction and internal fixation of his hip fracture. He began to develop  watery diarrhea shortly before discharge on November 17. A C. difficile PCR at that time was negative. He continued to have severe, watery diarrhea after discharge. He was treated with Lomotil. He was rehospitalized on November 24. His C. difficile PCR was positive. He received 1 dose of empiric ciprofloxacin along with IV metronidazole upon admission. He is not on full dose IV metronidazole and oral ciprofloxacin. He states that he is feeling better today. His abdominal cramps have resolved and he is not having any more nausea. He states that his diarrhea seems to be a little better this morning and his stools are starting to have some form. His appetite is improved when he ate some of his breakfast this morning. He is asking when he can go home.   Review of Systems: Constitutional: positive for anorexia and weight loss, negative for chills, fevers and sweats Eyes: negative Ears, nose, mouth, throat, and face: negative Respiratory: negative Cardiovascular: negative Gastrointestinal: positive for abdominal pain, diarrhea and vomiting, negative for vomiting Genitourinary:negative  Past Medical History  Diagnosis Date  . Seizure disorder 2205    ALCOHOL RELATED  . Macular degeneration   . COPD (chronic obstructive pulmonary disease)   . Alcohol abuse   . Vitamin D deficiency  History  Substance Use Topics  . Smoking status: Former Smoker    Types: Cigarettes    Quit date: 05/07/2011  . Smokeless tobacco: Never Used  . Alcohol Use: 2.4 oz/week    4 Cans of beer per week     Comment: quit 2-3  years ago    Family History  Problem Relation Age of Onset  . Heart attack Father    Allergies  Allergen Reactions  . Acyclovir And Related Hives    OBJECTIVE: Blood pressure 132/58, pulse 62, temperature 97.7 F (36.5 C), temperature source Oral, resp. rate 16, height 5\' 8"  (1.727 m), weight 125 lb 7.1 oz (56.9 kg), SpO2 99 %. General: He is alert, comfortable and in good spirits.  His body mass index is 19 Skin: No rash Oral: Many missing teeth. No oropharyngeal lesions Lungs: Distant breath sounds Cor: Distant heart sounds. Regular S1 and S2 with no murmur heard Abdomen: Soft and nontender with quiet bowel sounds Extremities: His right wrist is in a cast. His right hip incision is healing nicely without evidence of infection.  Lab Results Lab Results  Component Value Date   WBC 7.0 06/16/2014   HGB 11.4* 06/16/2014   HCT 35.6* 06/16/2014   MCV 92.0 06/16/2014   PLT 327 06/16/2014    Lab Results  Component Value Date   CREATININE 0.91 06/16/2014   BUN 6 06/16/2014   NA 135* 06/16/2014   K 3.6* 06/16/2014   CL 104 06/16/2014   CO2 19 06/16/2014    Lab Results  Component Value Date   ALT 15 06/16/2014   AST 25 06/16/2014   ALKPHOS 175* 06/16/2014   BILITOT 0.4 06/16/2014     Microbiology: Recent Results (from the past 240 hour(s))  Clostridium Difficile by PCR     Status: Abnormal   Collection Time: 06/13/14  9:04 PM  Result Value Ref Range Status   C difficile by pcr POSITIVE (A) NEGATIVE Final    Comment: PERFORMED AT [place] TO, READ BACK BY AND VERIFIED WITH: Meade Maw AT 8588 06/14/14 BY [place] Performed at [facility]   Stool culture     Status: None (Preliminary result)   Collection Time: 06/13/14  9:04 PM  Result Value Ref Range Status   Specimen Description STOOL  Final   Special Requests Normal  Final   Culture   Final    NO SUSPICIOUS COLONIES, CONTINUING TO HOLD Performed at [facility]    Report Status PENDING  Incomplete  Clostridium Difficile by PCR     Status: Abnormal   Collection Time: 06/14/14 11:28 AM  Result Value Ref Range Status   C difficile by pcr POSITIVE (A) NEGATIVE Final    Comment: CRITICAL RESULT CALLED TO, READ BACK BY AND VERIFIED WITH: DUMAS,N RN @ 1406 06/14/14 LEONARD,A Performed at [place], [place]  for [facility] [phone] pager   [phone] cell 06/16/2014, 12:02 PM

## 2014-06-16 NOTE — Plan of Care (Signed)
Problem: Consults Goal: Skin Care Protocol Initiated - if Braden Score 18 or less If consults are not indicated, leave blank or document N/A  Outcome: Progressing Goal: Nutrition Consult-if indicated Outcome: Not Progressing Dietary consult ordered. Pt not eating

## 2014-06-16 NOTE — Plan of Care (Signed)
Problem: Phase I Progression Outcomes Goal: Pain controlled with appropriate interventions Outcome: Progressing Pt has not had any pain. Refused scheduled pain medicine Goal: OOB as tolerated unless otherwise ordered Outcome: Progressing To BSC

## 2014-06-17 LAB — COMPREHENSIVE METABOLIC PANEL
ALK PHOS: 172 U/L — AB (ref 39–117)
ALT: 18 U/L (ref 0–53)
ANION GAP: 13 (ref 5–15)
AST: 30 U/L (ref 0–37)
Albumin: 2.7 g/dL — ABNORMAL LOW (ref 3.5–5.2)
BUN: 6 mg/dL (ref 6–23)
CALCIUM: 8.1 mg/dL — AB (ref 8.4–10.5)
CO2: 25 mEq/L (ref 19–32)
Chloride: 99 mEq/L (ref 96–112)
Creatinine, Ser: 0.9 mg/dL (ref 0.50–1.35)
GFR calc Af Amer: >90 mL/min (ref >90)
GFR calc non Af Amer: 86 mL/min — ABNORMAL LOW (ref >90)
GLUCOSE: 99 mg/dL (ref 70–99)
Potassium: 3.2 mEq/L — ABNORMAL LOW (ref 3.7–5.3)
Sodium: 137 mEq/L (ref 137–147)
Total Bilirubin: 0.4 mg/dL (ref 0.3–1.2)
Total Protein: 5.8 g/dL — ABNORMAL LOW (ref 6.0–8.3)

## 2014-06-17 LAB — CBC WITH DIFFERENTIAL/PLATELET
Basophils Absolute: 0 10*3/uL (ref 0.0–0.1)
Basophils Relative: 0 % (ref 0–1)
EOS ABS: 0 10*3/uL (ref 0.0–0.7)
EOS PCT: 0 % (ref 0–5)
HCT: 34.7 % — ABNORMAL LOW (ref 39.0–52.0)
HEMOGLOBIN: 11.3 g/dL — AB (ref 13.0–17.0)
LYMPHS ABS: 1.3 10*3/uL (ref 0.7–4.0)
Lymphocytes Relative: 18 % (ref 12–46)
MCH: 29.7 pg (ref 26.0–34.0)
MCHC: 32.6 g/dL (ref 30.0–36.0)
MCV: 91.3 fL (ref 78.0–100.0)
MONOS PCT: 12 % (ref 3–12)
Monocytes Absolute: 0.8 10*3/uL (ref 0.1–1.0)
Neutro Abs: 4.9 10*3/uL (ref 1.7–7.7)
Neutrophils Relative %: 70 % (ref 43–77)
Platelets: 305 10*3/uL (ref 150–400)
RBC: 3.8 MIL/uL — ABNORMAL LOW (ref 4.22–5.81)
RDW: 18.3 % — ABNORMAL HIGH (ref 11.5–15.5)
WBC: 7 10*3/uL (ref 4.0–10.5)

## 2014-06-17 LAB — STOOL CULTURE: Special Requests: NORMAL

## 2014-06-17 MED ORDER — POTASSIUM CHLORIDE CRYS ER 20 MEQ PO TBCR
40.0000 meq | EXTENDED_RELEASE_TABLET | Freq: Three times a day (TID) | ORAL | Status: DC
Start: 2014-06-17 — End: 2014-06-21
  Administered 2014-06-17 – 2014-06-20 (×12): 40 meq via ORAL
  Filled 2014-06-17 (×15): qty 2

## 2014-06-17 MED ORDER — POTASSIUM CHLORIDE CRYS ER 20 MEQ PO TBCR
40.0000 meq | EXTENDED_RELEASE_TABLET | Freq: Once | ORAL | Status: DC
  Filled 2014-06-17: qty 2

## 2014-06-17 NOTE — Progress Notes (Signed)
Patient seen and examined   Subjective :stooling all night   History of Present Illness:This is a 68 y.o. year old male with significant past medical history of COPD, ETOH abuse, seizure disorder, recent R distal radius and right hip fracture s/p fall presenting with diarrhea. Patient noted to have been recently discharged from short-term rehabilitation. Was a short-term rehabilitation from November 9 with November 17. Per the patient and wife, patient has had persistent watery diarrhea since leaving the hospital. Symptoms started approximately 7 days ago. Has had recurrent episodes of voluminous watery diarrhea. Nonbloody. Nonbilious. Denies any abdominal pain. States that he has not had nausea but feels that he has had a decreased appetite. No fevers or chills. Currently denies any alcohol use. Has been using home Percocet with minimal improvement in diarrhea. Saw his PCP within the past 3-5 days for symptoms. Was given Lomotil. No improvement in symptoms. Denies any recent antibiotic use. Patient states he had multiple episodes of watery diarrhea today. Felt weak and dizzy which is what prompted patient to come to the hospital. Had colonoscopy w/ [facility] last year that was normal per pt.  several episodes today   General: alert and cooperative HEENT: PERRLA and extra ocular movement intact Heart: S1, S2 normal, no murmur, rub or gallop, regular rate and rhythm Lungs: clear to auscultation, no wheezes or rales and unlabored breathing Abdomen: abdomen is soft without significant tenderness, masses, organomegaly or guarding Extremities: extremities normal, atraumatic, no cyanosis or edema Skin:no rashes, no ecchymoses Neurology: normal without focal findings  Subjective Patient does feel a little bit better today, however still continues to have diarrhea By mouth intake has increased since yesterday  Assessment and plan 1-  Severe c diff Diarrhea-slowly improving Continue iv flagyl , PO  vanc , appreciate infectious disease input Vancomycin oral for 2 weeks, hopefully DC IV Flagyl tomorrow -trend WBC  Continue IVF   2- anion gap metabolic acidosis-improving  -likely secondary to above  Continue IV fluid with bicarbonate  3- Hyponatremia-improving  Hypokalemia Replete and continue IV fluids  4- ETOH use -currently denies -CIWA   5- COPD  -stable  -no resp distress/cough/wheezing  6- R distal radius/R hip fx  6 x 4 cm postoperative right gluteal hematoma., Not a concern in the setting of stable hemoglobin -follow   7. Protein calorie malnutrition Nutrition consultation obtained

## 2014-06-18 LAB — COMPREHENSIVE METABOLIC PANEL
ALBUMIN: 2.6 g/dL — AB (ref 3.5–5.2)
ALT: 18 U/L (ref 0–53)
ANION GAP: 10 (ref 5–15)
AST: 33 U/L (ref 0–37)
Alkaline Phosphatase: 155 U/L — ABNORMAL HIGH (ref 39–117)
BILIRUBIN TOTAL: 0.3 mg/dL (ref 0.3–1.2)
BUN: 7 mg/dL (ref 6–23)
CO2: 27 mEq/L (ref 19–32)
CREATININE: 0.82 mg/dL (ref 0.50–1.35)
Calcium: 8.2 mg/dL — ABNORMAL LOW (ref 8.4–10.5)
Chloride: 100 mEq/L (ref 96–112)
GFR calc non Af Amer: 89 mL/min — ABNORMAL LOW (ref >90)
GLUCOSE: 96 mg/dL (ref 70–99)
Potassium: 4.2 mEq/L (ref 3.7–5.3)
Sodium: 137 mEq/L (ref 137–147)
Total Protein: 5.6 g/dL — ABNORMAL LOW (ref 6.0–8.3)

## 2014-06-18 LAB — CBC WITH DIFFERENTIAL/PLATELET
BASOS PCT: 0 % (ref 0–1)
Basophils Absolute: 0 10*3/uL (ref 0.0–0.1)
Eosinophils Absolute: 0 10*3/uL (ref 0.0–0.7)
Eosinophils Relative: 1 % (ref 0–5)
HEMATOCRIT: 35.3 % — AB (ref 39.0–52.0)
Hemoglobin: 11.5 g/dL — ABNORMAL LOW (ref 13.0–17.0)
Lymphocytes Relative: 26 % (ref 12–46)
Lymphs Abs: 1.5 10*3/uL (ref 0.7–4.0)
MCH: 29.4 pg (ref 26.0–34.0)
MCHC: 32.6 g/dL (ref 30.0–36.0)
MCV: 90.3 fL (ref 78.0–100.0)
Monocytes Absolute: 0.7 10*3/uL (ref 0.1–1.0)
Monocytes Relative: 11 % (ref 3–12)
NEUTROS ABS: 3.6 10*3/uL (ref 1.7–7.7)
Neutrophils Relative %: 62 % (ref 43–77)
Platelets: 287 10*3/uL (ref 150–400)
RBC: 3.91 MIL/uL — ABNORMAL LOW (ref 4.22–5.81)
RDW: 18.4 % — ABNORMAL HIGH (ref 11.5–15.5)
WBC: 5.8 10*3/uL (ref 4.0–10.5)

## 2014-06-18 MED ORDER — RESOURCE INSTANT PROTEIN PO PWD PACKET
1.0000 | Freq: Three times a day (TID) | ORAL | Status: DC
  Administered 2014-06-19 – 2014-06-20 (×6): 6 g via ORAL
  Filled 2014-06-18 (×10): qty 6

## 2014-06-18 MED ORDER — BOOST PLUS PO LIQD
237.0000 mL | Freq: Every day | ORAL | Status: DC
  Filled 2014-06-18: qty 237

## 2014-06-18 MED ORDER — ZOLPIDEM TARTRATE 5 MG PO TABS
5.0000 mg | ORAL_TABLET | Freq: Every evening | ORAL | Status: DC | PRN
  Filled 2014-06-18: qty 1

## 2014-06-18 NOTE — Evaluation (Signed)
Physical Therapy Evaluation Patient Details Name: Joshua Reed MRN: [id number] DOB: March 27, 1946 Today's Date: 06/18/2014   History of Present Illness  67 y.o. year old male with significant past medical history of COPD, ETOH abuse, seizure disorder, recent  R distal radius and right hip fracture s/p fall  presenting with diarrhea. Patient noted to have been recently discharged from short-term rehabilitation. Was in CIR from November 9 with November 17.  Clinical Impression  *Pt is independent with mobility using his platform rolling walker. He walked 200' without LOB and performed standing RLE exercises. He is safe to DC home from PT standpoint. No further PT needs. *    Follow Up Recommendations No PT follow up    Equipment Recommendations  None recommended by PT    Recommendations for Other Services       Precautions / Restrictions Precautions Precautions: Fall Restrictions Weight Bearing Restrictions: Yes RUE Weight Bearing: Weight bear through elbow only RLE Weight Bearing: Weight bearing as tolerated Other Position/Activity Restrictions: Pt can weightbear through the right forearm.      Mobility  Bed Mobility Overal bed mobility: Independent                Transfers Overall transfer level: Modified independent Equipment used: Rolling walker (2 wheeled) Transfers: Sit to/from Stand Sit to Stand: Modified independent (Device/Increase time)         General transfer comment: with PFRW  Ambulation/Gait Ambulation/Gait assistance: Modified independent (Device/Increase time) Ambulation Distance (Feet): 200 Feet Assistive device: Right platform walker;Rolling walker (2 wheeled)       General Gait Details: steady with ambulation  Stairs            Wheelchair Mobility    Modified Rankin (Stroke Patients Only)       Balance Overall balance assessment: Modified Independent                                            Pertinent Vitals/Pain Pain Assessment: No/denies pain    Home Living Family/patient expects to be discharged to:: Private residence Living Arrangements: Spouse/significant other Available Help at Discharge: Family;Available 24 hours/day Type of Home: House Home Access: Stairs to enter Entrance Stairs-Rails: None Entrance Stairs-Number of Steps: 2 Home Layout: One level Home Equipment: Walker - 2 wheels;Bedside commode (platform for RW)      Prior Function           Comments: Pt works for [employer]. Fall occurred at work     Hand Dominance   Dominant Hand: Right    Extremity/Trunk Assessment     RUE Deficits / Details: WB through elbow only; pt in cast from MCPs to distal elbow. Able to elevate R shoulder, flex/extend R fingers, supination AROM decreased 40%.    RUE: Unable to fully assess due to immobilization       Lower Extremity Assessment: Overall WFL for tasks assessed         Communication      Cognition Arousal/Alertness: Awake/alert Behavior During Therapy: WFL for tasks assessed/performed Overall Cognitive Status: Within Functional Limits for tasks assessed                      General Comments      Exercises Total Joint Exercises Long Arc Quad: AROM;Right;10 reps;Seated Marching in Standing: AROM;Right;10 reps;Standing Standing Hip Extension: AROM;Right;10 reps;Standing General Exercises - Lower  Extremity Hip ABduction/ADduction: AROM;Right;10 reps;Standing Heel Raises: AROM;Both;10 reps;Standing      Assessment/Plan    PT Assessment Patent does not need any further PT services  PT Diagnosis     PT Problem List    PT Treatment Interventions     PT Goals (Current goals can be found in the Care Plan section) Acute Rehab PT Goals Patient Stated Goal: to get over the diarrhea PT Goal Formulation: All assessment and education complete, DC therapy    Frequency     Barriers to discharge         Co-evaluation               End of Session   Activity Tolerance: Patient tolerated treatment well Patient left: in chair;with call bell/phone within reach Nurse Communication: Mobility status         Time: 1312-1330 PT Time Calculation (min) (ACUTE ONLY): 18 min   Charges:   PT Evaluation $Initial PT Evaluation Tier I: 1 Procedure PT Treatments $Gait Training: 8-22 mins   PT G Codes:          Philomena Doheny 06/18/2014, 1:44 PM [phone]

## 2014-06-18 NOTE — Progress Notes (Signed)
NUTRITION FOLLOW-UP/CONSULT  INTERVENTION: -Continue Boost Plus daily, providing 360 kcal and 14g of protein. -Provide Magic cup BID as 1400 and 2000 snack, each supplement provides 290 kcal and 9 grams of protein -RD to continue to monitor  NUTRITION DIAGNOSIS: Inadequate oral intake related to diarrhea as evidenced by wt loss and poor po, improved.  Goal: Pt to meet >/= 90% of their estimated nutrition needs, progressing   Monitor:  Weight trend, po and supplemental intake,labs  ASSESSMENT: 68 y.o. year old male with significant past medical history of COPD, ETOH abuse, seizure disorder, recent R distal radius and right hip fracture s/p fall presenting with diarrhea.   RD consulted regarding protein/calorie malnutrition. Pt was assessed 11/25.  PO intake: 75-100% Pt reports not liking Boost supplements, states they are too sweet. Pt's wife is bringing him his meals from outside the hospital and pt reports eating well. Pt's wife is also providing pt with [employer] brand protein bars with 17g of protein.   RD to decrease Boost order from TID to daily. RD also ordered pt Magic Cups BID at snack times.  Labs reviewed.  Height: Ht Readings from Last 1 Encounters:  06/14/14 5\' 8"  (1.727 m)    Weight: Wt Readings from Last 1 Encounters:  06/14/14 125 lb 7.1 oz (56.9 kg)    Estimated Nutritional Needs: Kcal: 1500-1700 Protein: 85-95 g Fluid: >1.7 L/day  Skin: intact  Diet Order: Diet regular  EDUCATION NEEDS: -Education needs addressed   Intake/Output Summary (Last 24 hours) at 06/18/14 1245 Last data filed at 06/18/14 0700  Gross per 24 hour  Intake   2770 ml  Output      0 ml  Net   2770 ml    Last BM: 11/29  Labs:   Recent Labs Lab 06/16/14 0440 06/17/14 0519 06/18/14 0610  NA 135* 137 137  K 3.6* 3.2* 4.2  CL 104 99 100  CO2 19 25 27   BUN 6 6 7   CREATININE 0.91 0.90 0.82  CALCIUM 8.3* 8.1* 8.2*  GLUCOSE 96 99 96    CBG (last 3)  No  results for input(s): GLUCAP in the last 72 hours.  Scheduled Meds: . aspirin  325 mg Oral BID  . cholecalciferol  1,000 Units Oral Daily  . heparin  5,000 Units Subcutaneous 3 times per day  . lactose free nutrition  237 mL Oral TID WC  . nicotine  21 mg Transdermal Daily  . OxyCODONE  15 mg Oral Q12H  . phenytoin  200 mg Oral BID  . potassium chloride  40 mEq Oral TID  . protein supplement  6 g Oral TID WC  . saccharomyces boulardii  250 mg Oral BID  . tiotropium  18 mcg Inhalation Daily  . vancomycin  500 mg Oral 4 times per day    Continuous Infusions: .  sodium bicarbonate infusion 1/4 NS 1000 mL 75 mL/hr at 06/18/14 1232    Clayton Bibles, MS, RD, LDN Pager: [phone] After Hours Pager: [phone]

## 2014-06-18 NOTE — Progress Notes (Signed)
Patient seen and examined   Subjective :stooling all night   History of Present Illness:This is a 68 y.o. year old male with significant past medical history of COPD, ETOH abuse, seizure disorder, recent R distal radius and right hip fracture s/p fall presenting with diarrhea. Patient noted to have been recently discharged from short-term rehabilitation. Was a short-term rehabilitation from November 9 with November 17. Per the patient and wife, patient has had persistent watery diarrhea since leaving the hospital. Symptoms started approximately 7 days ago. Has had recurrent episodes of voluminous watery diarrhea. Nonbloody. Nonbilious. Denies any abdominal pain. States that he has not had nausea but feels that he has had a decreased appetite. No fevers or chills. Currently denies any alcohol use. Has been using home Percocet with minimal improvement in diarrhea. Saw his PCP within the past 3-5 days for symptoms. Was given Lomotil. No improvement in symptoms. Denies any recent antibiotic use. Patient states he had multiple episodes of watery diarrhea today. Felt weak and dizzy which is what prompted patient to come to the hospital. Had colonoscopy w/ [facility] last year that was normal per pt.  several episodes today   General: alert and cooperative HEENT: PERRLA and extra ocular movement intact Heart: S1, S2 normal, no murmur, rub or gallop, regular rate and rhythm Lungs: clear to auscultation, no wheezes or rales and unlabored breathing Abdomen: abdomen is soft mild bilateral lower quadrant tenderness, masses, organomegaly or guarding Extremities: extremities normal, atraumatic, no cyanosis or edema Skin:no rashes, no ecchymoses Neurology: normal without focal findings  Subjective Diarrhea improving steadily, slept well last night  Assessment and plan 1-  Severe c diff Diarrhea-slowly improving Discontinue IV flagyl , continue PO vanc , appreciate infectious disease input Vancomycin oral for  2 weeks, hopefully -trend WBC  Continue IVF with bicarbonate  2- anion gap metabolic acidosis-improving  -likely secondary to above  Continue IV fluid with bicarbonate  3- Hyponatremia-improving  Hypokalemia Replete and continue IV fluids  4- ETOH use -currently denies -CIWA   5- COPD  -stable  -no resp distress/cough/wheezing  6- R distal radius/R hip fx  6 x 4 cm postoperative right gluteal hematoma., Not a concern in the setting of stable hemoglobin -follow   7. Protein calorie malnutrition Nutrition consultation obtained  8. Insomnia Ambien as needed

## 2014-06-19 LAB — CBC WITH DIFFERENTIAL/PLATELET
BASOS PCT: 0 % (ref 0–1)
Basophils Absolute: 0 10*3/uL (ref 0.0–0.1)
Eosinophils Absolute: 0.1 10*3/uL (ref 0.0–0.7)
Eosinophils Relative: 1 % (ref 0–5)
HCT: 37.9 % — ABNORMAL LOW (ref 39.0–52.0)
Hemoglobin: 12.4 g/dL — ABNORMAL LOW (ref 13.0–17.0)
LYMPHS PCT: 19 % (ref 12–46)
Lymphs Abs: 1.5 10*3/uL (ref 0.7–4.0)
MCH: 29.7 pg (ref 26.0–34.0)
MCHC: 32.7 g/dL (ref 30.0–36.0)
MCV: 90.7 fL (ref 78.0–100.0)
Monocytes Absolute: 1.1 10*3/uL — ABNORMAL HIGH (ref 0.1–1.0)
Monocytes Relative: 13 % — ABNORMAL HIGH (ref 3–12)
NEUTROS PCT: 67 % (ref 43–77)
Neutro Abs: 5.5 10*3/uL (ref 1.7–7.7)
PLATELETS: 285 10*3/uL (ref 150–400)
RBC: 4.18 MIL/uL — ABNORMAL LOW (ref 4.22–5.81)
RDW: 18.3 % — ABNORMAL HIGH (ref 11.5–15.5)
WBC: 8.2 10*3/uL (ref 4.0–10.5)

## 2014-06-19 LAB — COMPREHENSIVE METABOLIC PANEL
ALBUMIN: 2.8 g/dL — AB (ref 3.5–5.2)
ALT: 22 U/L (ref 0–53)
AST: 37 U/L (ref 0–37)
Alkaline Phosphatase: 159 U/L — ABNORMAL HIGH (ref 39–117)
Anion gap: 12 (ref 5–15)
BILIRUBIN TOTAL: 0.4 mg/dL (ref 0.3–1.2)
BUN: 11 mg/dL (ref 6–23)
CO2: 29 meq/L (ref 19–32)
Calcium: 8.2 mg/dL — ABNORMAL LOW (ref 8.4–10.5)
Chloride: 93 mEq/L — ABNORMAL LOW (ref 96–112)
Creatinine, Ser: 0.86 mg/dL (ref 0.50–1.35)
GFR calc Af Amer: >90 mL/min (ref >90)
GFR, EST NON AFRICAN AMERICAN: 88 mL/min — AB (ref >90)
Glucose, Bld: 99 mg/dL (ref 70–99)
Potassium: 4.6 mEq/L (ref 3.7–5.3)
SODIUM: 134 meq/L — AB (ref 137–147)
Total Protein: 5.9 g/dL — ABNORMAL LOW (ref 6.0–8.3)

## 2014-06-19 MED ORDER — METRONIDAZOLE IN NACL 5-0.79 MG/ML-% IV SOLN
500.0000 mg | Freq: Three times a day (TID) | INTRAVENOUS | Status: DC
  Administered 2014-06-19 – 2014-06-20 (×4): 500 mg via INTRAVENOUS
  Filled 2014-06-19 (×6): qty 100

## 2014-06-19 NOTE — Progress Notes (Signed)
06/19/14 0800  Printed C-Diff information sheets from Epic and gave to pt's wife.

## 2014-06-19 NOTE — Progress Notes (Signed)
CARE MANAGEMENT NOTE 06/19/2014  Patient:  Joshua Reed, Joshua Reed   Account Number:  [id number]  Date Initiated:  06/16/2014  Documentation initiated by:  Edwyna Shell  Subjective/Objective Assessment:   68 yo male admitted with C Diff from home with spouse     Action/Plan:   discharge planning   Anticipated DC Date:  06/18/2014   Anticipated DC Plan:  [place]  CM consult      Choice offered to / List presented to:             Status of service:  In process, will continue to follow Medicare Important Message given?   (If response is "NO", the following Medicare IM given date fields will be blank) Date Medicare IM given:   Medicare IM given by:   Date Additional Medicare IM given:   Additional Medicare IM given by:    Discharge Disposition:    Per UR Regulation:    If discussed at Long Length of Stay Meetings, dates discussed:    Comments:  06/19/14 Edwyna Shell RN BSN CM [phone] Spoke with Worker's Comp case manager, [name], and she stated that the patient was previously followed in home by [facility] PT and OT but she is unaware which company. [name] stated that she will find out the [facility] agency and communicate to this CM. [name] contact [phone]. No orders, will continue to follow  06/16/14 New  [phone] No orders, no CM needs identified, will continue to follow
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
## 2014-06-19 NOTE — Plan of Care (Signed)
Problem: Phase II Progression Outcomes Goal: Vital signs remain stable Outcome: Progressing  Comments:  BP 102/62  HR 65 T 97.9

## 2014-06-19 NOTE — Progress Notes (Signed)
    [facility] for Infectious Disease  Date of Admission:  06/13/2014  Antibiotics: Vancomycin oral Flagyl IV  Subjective: Increase in stool output overnight, no fever  Objective: Temp:  [97.6 F (36.4 C)-97.9 F (36.6 C)] 97.9 F (36.6 C) (11/30 0601) Pulse Rate:  [65-76] 65 (11/30 0601) Resp:  [18] 18 (11/30 0601) BP: (102-106)/(62-63) 102/62 mmHg (11/30 0601) SpO2:  [96 %-100 %] 96 % (11/30 0601)  General: awake, alert, nad Skin: no rashes Lungs: CTA B Cor: RRR Abdomen: soft, mild distension Ext: no edema  Lab Results Lab Results  Component Value Date   WBC 8.2 06/19/2014   HGB 12.4* 06/19/2014   HCT 37.9* 06/19/2014   MCV 90.7 06/19/2014   PLT 285 06/19/2014    Lab Results  Component Value Date   CREATININE 0.86 06/19/2014   BUN 11 06/19/2014   NA 134* 06/19/2014   K 4.6 06/19/2014   CL 93* 06/19/2014   CO2 29 06/19/2014    Lab Results  Component Value Date   ALT 22 06/19/2014   AST 37 06/19/2014   ALKPHOS 159* 06/19/2014   BILITOT 0.4 06/19/2014      Microbiology: Recent Results (from the past 240 hour(s))  Clostridium Difficile by PCR     Status: Abnormal   Collection Time: 06/13/14  9:04 PM  Result Value Ref Range Status   C difficile by pcr POSITIVE (A) NEGATIVE Final    Comment: PERFORMED AT [facility], READ BACK BY AND VERIFIED WITH: Meade Maw AT [id number] 06/14/14 BY [place] Performed at [facility]   Stool culture     Status: None   Collection Time: 06/13/14  9:04 PM  Result Value Ref Range Status   Specimen Description STOOL  Final   Special Requests Normal  Final   Culture   Final    NO SALMONELLA, SHIGELLA, CAMPYLOBACTER, YERSINIA, OR E.COLI [id number] ISOLATED Performed at [facility]    Report Status 06/17/2014 FINAL  Final  Ova and parasite examination     Status: None   Collection Time: 06/14/14  6:54 AM  Result Value Ref Range Status   Specimen Description STOOL  Final   Special Requests NONE  Final   Ova and parasites   Final    NO OVA OR PARASITES SEEN Performed at [facility]    Report Status 06/16/2014 FINAL  Final  Clostridium Difficile by PCR     Status: Abnormal   Collection Time: 06/14/14 11:28 AM  Result Value Ref Range Status   C difficile by pcr POSITIVE (A) NEGATIVE Final    Comment: CRITICAL RESULT CALLED TO, READ BACK BY AND VERIFIED WITH: DUMAS,N RN @ 1406 06/14/14 LEONARD,A Performed at [facility]     Studies/Results: No results found.  Assessment/Plan:  1) C diff - hopefully will turn the corner soon.  I have stopped the oral supplement to reduce osmotic pressure that can exacerbate diarrhea.   Dr. Baxter Flattery to follow up tomorrow.    Scharlene Gloss, [facility] [place] [email or web address] [phone] pager   [phone] cell 06/19/2014, 2:24 PM

## 2014-06-19 NOTE — Progress Notes (Signed)
Pt reported having several episode of loose stool during the night, also reported pain in his bilateral lower legs,PRN pain MEDS given as order. Pt reported get relief from legs pain but continue to have loose stool. Pt is receiving IV antibiotics as order, see MAR

## 2014-06-19 NOTE — Progress Notes (Addendum)
Patient seen and examined   Subjective :stooling all night   History of Present Illness:This is a 68 y.o. year old male with significant past medical history of COPD, ETOH abuse, seizure disorder, recent R distal radius and right hip fracture s/p fall presenting with diarrhea. Patient noted to have been recently discharged from short-term rehabilitation. Was a short-term rehabilitation from November 9 with November 17. Per the patient and wife, patient has had persistent watery diarrhea since leaving the hospital. Symptoms started approximately 7 days ago. Has had recurrent episodes of voluminous watery diarrhea. Nonbloody. Nonbilious. Denies any abdominal pain. States that he has not had nausea but feels that he has had a decreased appetite. No fevers or chills. Currently denies any alcohol use. Has been using home Percocet with minimal improvement in diarrhea. Saw his PCP within the past 3-5 days for symptoms. Was given Lomotil. No improvement in symptoms. Denies any recent antibiotic use. Patient states he had multiple episodes of watery diarrhea today. Felt weak and dizzy which is what prompted patient to come to the hospital. Had colonoscopy w/ [facility] last year that was normal per pt.  several episodes today   General: alert and cooperative HEENT: PERRLA and extra ocular movement intact Heart: S1, S2 normal, no murmur, rub or gallop, regular rate and rhythm Lungs: clear to auscultation, no wheezes or rales and unlabored breathing Abdomen: abdomen is soft mild bilateral lower quadrant tenderness, masses, organomegaly or guarding Extremities: extremities normal, atraumatic, no cyanosis or edema Skin:no rashes, no ecchymoses Neurology: normal without focal findings  Subjective Apparently diarrhea has worsened, he should have 26 bowel movements  Assessment and plan 1-  Severe c diff Diarrhea-slowly improving Reinstitute IV flagyl for another 24 hours, continue PO vanc , discussed with Dr.  Herbie Baltimore comer, Vancomycin oral for 2 weeks, hopefully -trend WBC  Continue IVF with bicarbonate  2- anion gap metabolic acidosis-improving  -likely secondary to above  Continue IV fluid with bicarbonate  3- Hyponatremia-improving  Hypokalemia Replete and continue IV fluids  4- ETOH use -currently denies -CIWA   5- COPD  -stable  -no resp distress/cough/wheezing  6- R distal radius/R hip fx  6 x 4 cm postoperative right gluteal hematoma., Not a concern in the setting of stable hemoglobin -follow   7. Protein calorie malnutrition Nutrition consultation obtained  8. Insomnia Ambien as needed

## 2014-06-20 MED ORDER — FIDAXOMICIN 200 MG PO TABS
200.0000 mg | ORAL_TABLET | Freq: Two times a day (BID) | ORAL | Status: DC
  Administered 2014-06-20 – 2014-06-21 (×2): 200 mg via ORAL
  Filled 2014-06-20 (×3): qty 1

## 2014-06-20 MED ORDER — WATER FOR INJECTION STERILE IV SOLN
INTRAVENOUS | Status: DC
  Filled 2014-06-20: qty 850

## 2014-06-20 MED ORDER — WATER FOR INJECTION STERILE IV SOLN
INTRAVENOUS | Status: DC
  Administered 2014-06-20 – 2014-06-21 (×2): via INTRAVENOUS
  Filled 2014-06-20 (×2): qty 850

## 2014-06-20 MED ORDER — DICYCLOMINE HCL 10 MG PO CAPS
10.0000 mg | ORAL_CAPSULE | Freq: Three times a day (TID) | ORAL | Status: DC
  Administered 2014-06-20 – 2014-06-24 (×12): 10 mg via ORAL
  Filled 2014-06-20 (×16): qty 1

## 2014-06-20 NOTE — Progress Notes (Addendum)
[facility] PROGRESS NOTE  Joshua Reed HYQ:[id number] DOB: 07-30-1945 DOA: 06/13/2014 PCP: Gennette Pac, MD  Assessment/Plan: Active Problems:   Seizure disorder   COPD (chronic obstructive pulmonary disease)   Alcohol abuse   Vitamin D deficiency   Macular degeneration   Distal radius fracture, right   Intertrochanteric fracture of right hip   C. difficile colitis   Metabolic acidosis   Protein-calorie malnutrition, severe   Poor dentition    Assessment and plan 1- Severe c diff Diarrhea-slowly improving Continue by mouth vancomycin, IV Flagyl, infectious disease following  on a daily basis Diarrhea not improving much Continue IVF with bicarbonate  2- anion gap metabolic acidosis-improving  -likely secondary to above  Continue IV fluid with bicarbonate with potassium  3- Hyponatremia-improving  Hypokalemia Replete and continue IV fluids  4- ETOH use -currently denies -CIWA   5- COPD  -stable  -no resp distress/cough/wheezing  6- R distal radius/R hip fx  6 x 4 cm postoperative right gluteal hematoma., Not a concern in the setting of stable hemoglobin -follow   7. Protein calorie malnutrition Nutrition consultation obtained  8. Insomnia Patient does not want to take any medications for insomnia  Code Status: full Family Communication: family updated about patient's clinical progress Disposition Plan:  As above    Brief narrative: History of Present Illness:This is a 68 y.o. year old male with significant past medical history of COPD, ETOH abuse, seizure disorder, recent R distal radius and right hip fracture s/p fall presenting with diarrhea. Patient noted to have been recently discharged from short-term rehabilitation. Was a short-term rehabilitation from November 9 with November 17. Per the patient and wife, patient has had persistent watery diarrhea since leaving the hospital. Symptoms started approximately 7 days ago. Has had  recurrent episodes of voluminous watery diarrhea. Nonbloody. Nonbilious. Denies any abdominal pain. States that he has not had nausea but feels that he has had a decreased appetite. No fevers or chills. Currently denies any alcohol use. Has been using home Percocet with minimal improvement in diarrhea. Saw his PCP within the past 3-5 days for symptoms. Was given Lomotil. No improvement in symptoms. Denies any recent antibiotic use. Patient states he had multiple episodes of watery diarrhea today. Felt weak and dizzy which is what prompted patient to come to the hospital. Had colonoscopy w/ [facility] last year that was normal per pt.  several episodes today   Consultants:  Infectious disease  Procedures:  None  Antibiotics:  IV Flagyl  By mouth vancomycin  HPI/Subjective: Complaining of having 26 bowel movements yesterday  Objective: Filed Vitals:   06/19/14 0601 06/19/14 2014 06/19/14 2131 06/20/14 0510  BP: 102/62  112/68 97/67  Pulse: 65  73 79  Temp: 97.9 F (36.6 C)  97.9 F (36.6 C) 97.9 F (36.6 C)  TempSrc: Oral  Oral Oral  Resp: 18  18 18   Height:  5\' 9"  (1.753 m)    Weight:  58.514 kg (129 lb)    SpO2: 96%  96% 97%    Intake/Output Summary (Last 24 hours) at 06/20/14 1233 Last data filed at 06/20/14 0700  Gross per 24 hour  Intake   1500 ml  Output      0 ml  Net   1500 ml    Exam:  General: alert & oriented x 3 In NAD  Cardiovascular: RRR, nl S1 s2  Respiratory: Decreased breath sounds at the bases, scattered rhonchi, no crackles  Abdomen: soft +BS NT/ND, no masses  palpable  Extremities: No cyanosis and no edema      Data Reviewed: Basic Metabolic Panel:  Recent Labs Lab 06/15/14 0525 06/16/14 0440 06/17/14 0519 06/18/14 0610 06/19/14 0514  NA 135* 135* 137 137 134*  K 3.8 3.6* 3.2* 4.2 4.6  CL 108 104 99 100 93*  CO2 14* 19 25 27 29   GLUCOSE 106* 96 99 96 99  BUN 9 6 6 7 11   CREATININE 0.85 0.91 0.90 0.82 0.86  CALCIUM 8.4 8.3* 8.1*  8.2* 8.2*    Liver Function Tests:  Recent Labs Lab 06/15/14 0525 06/16/14 0440 06/17/14 0519 06/18/14 0610 06/19/14 0514  AST 25 25 30  33 37  ALT 17 15 18 18 22   ALKPHOS 178* 175* 172* 155* 159*  BILITOT 0.3 0.4 0.4 0.3 0.4  PROT 6.2 5.7* 5.8* 5.6* 5.9*  ALBUMIN 2.7* 2.6* 2.7* 2.6* 2.8*   No results for input(s): LIPASE, AMYLASE in the last 168 hours. No results for input(s): AMMONIA in the last 168 hours.  CBC:  Recent Labs Lab 06/15/14 0525 06/16/14 0440 06/17/14 0519 06/18/14 0610 06/19/14 0514  WBC 10.6* 7.0 7.0 5.8 8.2  NEUTROABS 8.0* 4.6 4.9 3.6 5.5  HGB 11.3* 11.4* 11.3* 11.5* 12.4*  HCT 35.6* 35.6* 34.7* 35.3* 37.9*  MCV 93.2 92.0 91.3 90.3 90.7  PLT 398 327 305 287 285    Cardiac Enzymes: No results for input(s): CKTOTAL, CKMB, CKMBINDEX, TROPONINI in the last 168 hours. BNP (last 3 results) No results for input(s): PROBNP in the last 8760 hours.   CBG: No results for input(s): GLUCAP in the last 168 hours.  Recent Results (from the past 240 hour(s))  Clostridium Difficile by PCR     Status: Abnormal   Collection Time: 06/13/14  9:04 PM  Result Value Ref Range Status   C difficile by pcr POSITIVE (A) NEGATIVE Final    Comment: PERFORMED AT [place] TO, READ BACK BY AND VERIFIED WITH: Meade Maw AT 2130 06/14/14 BY [place] Performed at [facility]   Stool culture     Status: None   Collection Time: 06/13/14  9:04 PM  Result Value Ref Range Status   Specimen Description STOOL  Final   Special Requests Normal  Final   Culture   Final    NO SALMONELLA, SHIGELLA, CAMPYLOBACTER, YERSINIA, OR E.COLI 0157:H7 ISOLATED Performed at [facility]    Report Status 06/17/2014 FINAL  Final  Ova and parasite examination     Status: None   Collection Time: 06/14/14  6:54 AM  Result Value Ref Range Status   Specimen Description STOOL  Final   Special Requests NONE  Final   Ova and parasites   Final     NO OVA OR PARASITES SEEN Performed at [facility]    Report Status 06/16/2014 FINAL  Final  Clostridium Difficile by PCR     Status: Abnormal   Collection Time: 06/14/14 11:28 AM  Result Value Ref Range Status   C difficile by pcr POSITIVE (A) NEGATIVE Final    Comment: CRITICAL RESULT CALLED TO, READ BACK BY AND VERIFIED WITH: DUMAS,N RN @ 1406 06/14/14 LEONARD,A Performed at [facility]      Studies: Dg Wrist 2 Views Right  06/18/14   CLINICAL DATA:  Fracture the distal radius and ulna, closed. Initially counter. Fluoroscopy time 16.3 seconds.  EXAM: RIGHT WRIST - 2 VIEW; DG C-ARM 61-120 MIN  COMPARISON:  New 2014/06/18  FINDINGS:  The patient has undergone screw plate fixation of the distal radius. Distal ulnar fracture is again noted.  IMPRESSION: Interval ORIF of the distal radius.   Electronically Signed   By: Shon Hale M.D.   On: 05/24/2014 22:48   Dg Wrist 2 Views Right  05/24/2014   CLINICAL DATA:  Post reduction wrist fracture.  EXAM: RIGHT WRIST - 2 VIEW  COMPARISON:  05/24/2014  FINDINGS: Wrist is imaged through splinting material. There are comminuted fractures of the distal radius and ulna. There has been interval reduction with significant improvement in the angulation of the radiocarpal joint. Intercarpal spaces are grossly normal. Nondisplaced carpal fractures would be difficult to exclude but are not identified.  IMPRESSION: 1. Interval reduction of wrist fractures. 2. Significantly improved radiocarpal angulation.   Electronically Signed   By: Shon Hale M.D.   On: 05/24/2014 18:41   Dg Wrist 2 Views Right  05/24/2014   CLINICAL DATA:  [place] off ladder today from 10 feet. Deformity of right wrist. Right wrist pain. Initial encounter.  EXAM: RIGHT WRIST - 2 VIEW  COMPARISON:  None.  FINDINGS: Study is limited due to patient condition and limited positioning. Comminuted fracture noted through the distal right radius with impaction. No definite dislocation.  Ulnar styloid fracture. Diffuse soft tissue swelling noted.  IMPRESSION: Comminuted, impacted distal right radial fracture. Distal ulnar/ulnar styloid fracture.   Electronically Signed   By: Rolm Baptise M.D.   On: 05/24/2014 15:49   Dg Femur Right  05/24/2014   CLINICAL DATA:  Right femoral intertrochanteric fracture.  EXAM: DG C-ARM 61-120 MIN; RIGHT FEMUR - 2 VIEW  COMPARISON:  CT 05/24/2014  FINDINGS: Intraoperative images demonstrate surgical fixation of the intertrochanteric fracture with a dynamic hip screw. Intramedullary nail extends into the distal femur. The right hip is located.  IMPRESSION: Internal fixation of the right femoral intertrochanteric fracture.   Electronically Signed   By: Markus Daft M.D.   On: 05/24/2014 22:43   Ct Chest W Contrast  05/24/2014   CLINICAL DATA:  Trauma with fall from ladder 6-8 feet. Unable to raise arms above head.  EXAM: CT CHEST, ABDOMEN, AND PELVIS WITH CONTRAST  TECHNIQUE: Multidetector CT imaging of the chest, abdomen and pelvis was performed following the standard protocol during bolus administration of intravenous contrast.  CONTRAST:  29mL OMNIPAQUE IOHEXOL 300 MG/ML  SOLN  COMPARISON:  CT chest 01/19/2014  FINDINGS: CT CHEST FINDINGS  The lungs are well inflated without consolidation or effusion. There is no pneumothorax. There is mild centrilobular emphysematous disease. Airways are within normal. Heart is normal in size. There is minimal calcified plaque over the thoracic aorta. Remaining mediastinal structures are unremarkable.  CT ABDOMEN AND PELVIS FINDINGS  The exam demonstrates a normal spleen, pancreas, gallbladder and adrenal glands. There is minimal stable atrophy over the anterior right lobe of the liver as a liver is otherwise unremarkable. Kidneys are within normal. There is calcified plaque over the abdominal aorta. There is no free fluid or free peritoneal air. Appendix is not seen. There is a slight increased number of small mesenteric lymph  nodes. There is a 1.2 cm oval low-density focus within the mesentery of the mid abdomen left of midline just below the aortic bifurcation which may represent a small mesenteric cyst or low-density lymph node.  Pelvic images demonstrate mild bladder distention. Prostate and rectum are within normal. No free fluid.  There is a displaced intertrochanteric fracture of the right hip. There is a subtle nondisplaced  fracture of the right inferior pubic ramus. Remaining bones soft tissues are unremarkable.  IMPRESSION: Displaced right intertrochanteric hip fracture. Nondisplaced fracture of the right inferior pubic ramus.  No acute findings within the chest. No acute intra-abdominal findings.  Minimal nonspecific increased number of small mesenteric lymph nodes. Recommend followup abdominal pelvic CT 3 months.  Minimal centrilobular emphysematous disease.  These results were called by telephone at the time of interpretation on 05/24/2014 at 5:28 pm to Dr. Ermalinda Memos, who verbally acknowledged these results.   Electronically Signed   By: Marin Olp M.D.   On: 05/24/2014 17:28   Ct Abdomen Pelvis W Contrast  06/14/2014   CLINICAL DATA:  Diffuse watery diarrhea with abdominal pain.  EXAM: CT ABDOMEN AND PELVIS WITH CONTRAST  TECHNIQUE: Multidetector CT imaging of the abdomen and pelvis was performed using the standard protocol following bolus administration of intravenous contrast.  CONTRAST:  165mL OMNIPAQUE IOHEXOL 300 MG/ML  SOLN  COMPARISON:  05/24/2014  FINDINGS: BODY WALL: There is a fluid collection within the right gluteal region which measures 6 x 4 cm in axial dimension. The collection is both subcutaneous and intramuscular and has central high density consistent with hematoma. There is mild peripheral enhancement. The patient has undergone recent right hip surgery for fracture.  LOWER CHEST: Unremarkable.  ABDOMEN/PELVIS:  Liver: Capsular distortion with bands of low-density in the anterior right Liver. No visible  mass lesion. The main portal vein measures 18 mm.  Biliary: No evidence of biliary obstruction or stone.  Pancreas: Unremarkable.  Spleen: Unremarkable.  Adrenals: Unremarkable.  Kidneys and ureters: No hydronephrosis or stone.  Bladder: Unremarkable.  Reproductive: Unremarkable.  Bowel: Fluid levels with small and large bowel all the way to the rectum. No enteric or colonic wall thickening or abnormal enhancement. There is enlargement of small bowel lymph nodes which may be reactive given these findings. Follow-up recommendations were provided on the comparison study. The largest nodes measure up to 9 mm in short axis. Negative appendix.  Peritoneum: No ascites or pneumoperitoneum.  Vascular: Atherosclerosis of the aorta and branch vessels. No acute findings.  OSSEOUS: Interval right hip fixation. Imaged portions are unremarkable. No interval displacement of a inferior pubic ramus fracture on the right.  IMPRESSION: 1. Diarrheal illness without bowel wall thickening. 2. 6 x 4 cm postoperative right gluteal hematoma. 3. Liver scarring/macroscopic fibrosis.   Electronically Signed   By: Jorje Guild M.D.   On: 06/14/2014 00:42   Ct Abdomen Pelvis W Contrast  05/24/2014   CLINICAL DATA:  Trauma with fall from ladder 6-8 feet. Unable to raise arms above head.  EXAM: CT CHEST, ABDOMEN, AND PELVIS WITH CONTRAST  TECHNIQUE: Multidetector CT imaging of the chest, abdomen and pelvis was performed following the standard protocol during bolus administration of intravenous contrast.  CONTRAST:  42mL OMNIPAQUE IOHEXOL 300 MG/ML  SOLN  COMPARISON:  CT chest 01/19/2014  FINDINGS: CT CHEST FINDINGS  The lungs are well inflated without consolidation or effusion. There is no pneumothorax. There is mild centrilobular emphysematous disease. Airways are within normal. Heart is normal in size. There is minimal calcified plaque over the thoracic aorta. Remaining mediastinal structures are unremarkable.  CT ABDOMEN AND PELVIS  FINDINGS  The exam demonstrates a normal spleen, pancreas, gallbladder and adrenal glands. There is minimal stable atrophy over the anterior right lobe of the liver as a liver is otherwise unremarkable. Kidneys are within normal. There is calcified plaque over the abdominal aorta. There is no free fluid or free  peritoneal air. Appendix is not seen. There is a slight increased number of small mesenteric lymph nodes. There is a 1.2 cm oval low-density focus within the mesentery of the mid abdomen left of midline just below the aortic bifurcation which may represent a small mesenteric cyst or low-density lymph node.  Pelvic images demonstrate mild bladder distention. Prostate and rectum are within normal. No free fluid.  There is a displaced intertrochanteric fracture of the right hip. There is a subtle nondisplaced fracture of the right inferior pubic ramus. Remaining bones soft tissues are unremarkable.  IMPRESSION: Displaced right intertrochanteric hip fracture. Nondisplaced fracture of the right inferior pubic ramus.  No acute findings within the chest. No acute intra-abdominal findings.  Minimal nonspecific increased number of small mesenteric lymph nodes. Recommend followup abdominal pelvic CT 3 months.  Minimal centrilobular emphysematous disease.  These results were called by telephone at the time of interpretation on 05/24/2014 at 5:28 pm to Dr. Ermalinda Memos, who verbally acknowledged these results.   Electronically Signed   By: Marin Olp M.D.   On: 05/24/2014 17:28   Dg Pelvis Portable  05/24/2014   CLINICAL DATA:  Patient fell 8 feet off a ladder with right hip region pain  EXAM: PORTABLE PELVIS 1-2 VIEWS  COMPARISON:  None.  FINDINGS: There is an intertrochanteric femur fracture on the right with mild varus angulation at the fracture site. Fracture extends into the greater trochanter region or fracture is comminuted. No other fractures. No dislocation. Joint spaces appear intact.  IMPRESSION: Comminuted  intertrochanteric femur fracture on the right with mild varus angulation at the fracture site. No dislocation seen on this single view.   Electronically Signed   By: Lowella Grip M.D.   On: 05/24/2014 15:03   Ct 3d Recon At Scanner  05/25/2014   CLINICAL DATA:  Nonspecific (abnormal) findings on radiological and other examination of musculoskeletal system. Status post fall from the ladder, 6-8 feet. Right intertrochanteric fracture secondary to the fall.  EXAM: 3-DIMENSIONAL CT IMAGE RENDERING ON ACQUISITION WORKSTATION  TECHNIQUE: 3-dimensional CT images were rendered by post-processing of the original CT data on an acquisition workstation. The 3-dimensional CT images were interpreted and findings were reported in the accompanying complete CT report for this study  COMPARISON:  CT chest, abdomen and pelvis 05/24/2014 at 16:51 hr  FINDINGS: As seen on the comparison examination, the patient has a right hip fracture. The fracture extends from the greater trochanter along the intertrochanteric ridge to the superior margin of the lesser trochanter. Nondisplaced right inferior pubic ramus fracture seen on CT scan is not well demonstrated on these images.  IMPRESSION: Acute right intertrochanteric fracture.  Nondisplaced right inferior pubic ramus fracture is better demonstrated on the comparison exam.   Electronically Signed   By: Inge Rise M.D.   On: 05/25/2014 07:12   Dg Chest Portable 1 View  05/24/2014   CLINICAL DATA:  Fall 6-8 feet from ladder today. Generalized right pain. Initial encounter  EXAM: PORTABLE CHEST - 1 VIEW  COMPARISON:  CT 01/19/2014  FINDINGS: Mild hyperinflation/COPD. Lungs are clear. No pneumothorax or effusion. Heart is normal size. No visible rib fracture or other acute bony abnormality.  IMPRESSION: COPD.  No active disease.   Electronically Signed   By: Rolm Baptise M.D.   On: 05/24/2014 15:03   Dg C-arm 1-60 Min  05/24/2014   CLINICAL DATA:  Fracture the distal radius  and ulna, closed. Initially counter. Fluoroscopy time 16.3 seconds.  EXAM: RIGHT WRIST -  2 VIEW; DG C-ARM 61-120 MIN  COMPARISON:  New 05/24/2014  FINDINGS: The patient has undergone screw plate fixation of the distal radius. Distal ulnar fracture is again noted.  IMPRESSION: Interval ORIF of the distal radius.   Electronically Signed   By: Shon Hale M.D.   On: 05/24/2014 22:48   Dg C-arm 1-60 Min  05/24/2014   CLINICAL DATA:  Right femoral intertrochanteric fracture.  EXAM: DG C-ARM 61-120 MIN; RIGHT FEMUR - 2 VIEW  COMPARISON:  CT 05/24/2014  FINDINGS: Intraoperative images demonstrate surgical fixation of the intertrochanteric fracture with a dynamic hip screw. Intramedullary nail extends into the distal femur. The right hip is located.  IMPRESSION: Internal fixation of the right femoral intertrochanteric fracture.   Electronically Signed   By: Markus Daft M.D.   On: 05/24/2014 22:43    Scheduled Meds: . aspirin  325 mg Oral BID  . cholecalciferol  1,000 Units Oral Daily  . heparin  5,000 Units Subcutaneous 3 times per day  . metronidazole  500 mg Intravenous Q8H  . nicotine  21 mg Transdermal Daily  . OxyCODONE  15 mg Oral Q12H  . phenytoin  200 mg Oral BID  . potassium chloride  40 mEq Oral TID  . protein supplement  1 scoop Oral TID WC  . saccharomyces boulardii  250 mg Oral BID  . vancomycin  500 mg Oral 4 times per day   Continuous Infusions: . Water For Injection Sterile 850 mL with potassium chloride 20 mEq, sodium bicarbonate 150 mEq infusion 50 mL/hr at 06/20/14 1031    Active Problems:   Seizure disorder   COPD (chronic obstructive pulmonary disease)   Alcohol abuse   Vitamin D deficiency   Macular degeneration   Distal radius fracture, right   Intertrochanteric fracture of right hip   C. difficile colitis   Metabolic acidosis   Protein-calorie malnutrition, severe   Poor dentition   Time spent: 40 minutes   [facility] Pager [phone]. If  7PM-7AM, please contact night-coverage at [email or web address], password [facility] 06/20/2014, 12:33 PM  LOS: 7 days

## 2014-06-20 NOTE — Progress Notes (Signed)
CARE MANAGEMENT NOTE 06/20/2014  Patient:  Joshua Reed, Joshua Reed   Account Number:  [id number]  Date Initiated:  06/16/2014  Documentation initiated by:  Edwyna Shell  Subjective/Objective Assessment:   68 yo male admitted with C Diff from home with spouse     Action/Plan:   discharge planning   Anticipated DC Date:  06/18/2014   Anticipated DC Plan:  [place]  CM consult      Choice offered to / List presented to:             Status of service:  In process, will continue to follow Medicare Important Message given?   (If response is "NO", the following Medicare IM given date fields will be blank) Date Medicare IM given:   Medicare IM given by:   Date Additional Medicare IM given:   Additional Medicare IM given by:    Discharge Disposition:    Per UR Regulation:    If discussed at Long Length of Stay Meetings, dates discussed:    Comments:  06/20/14 Edwyna Shell RN BSN CM [phone] Patient lives at home with spouse and has a BSC and walker. He stated that he did receive [facility] PT, OT, RN in the home but does not feel that he needs these services resumed upon discharge because he feels he is able to manage with support of his wife in the home.  06/19/14 Edwyna Shell RN BSN CM [phone] Spoke with Worker's Comp case Freight forwarder, [name], and she stated that the patient was previously followed in home by [facility] PT and OT but she is unaware which company. [name] stated that she will find out the [facility] agency and communicate to this CM. [name] contact [phone]. No orders, will continue to follow  06/16/14 [place] [phone] No orders, no CM needs identified, will continue to follow

## 2014-06-20 NOTE — Progress Notes (Signed)
[facility]ious [facility]    Date of Admission:  06/13/2014   Total days of antibiotics 8        Day 6 vanco        Day 2 iv metronidazole          ID: THEDFORD BUNTON is a 68 y.o. male with c.difficile colitisActive Problems:   Seizure disorder   COPD (chronic obstructive pulmonary disease)   Alcohol abuse   Vitamin D deficiency   Macular degeneration   Distal radius fracture, right   Intertrochanteric fracture of right hip   C. difficile colitis   Metabolic acidosis   Protein-calorie malnutrition, severe   Poor dentition    Subjective: Afebrile, leukocytosis resolved. Still having perfuse watery diarrhea. Wife reports 14 x since this morning. Having abdominal cramping with bowel movement, no bloody stool.  Medications:  . aspirin  325 mg Oral BID  . cholecalciferol  1,000 Units Oral Daily  . heparin  5,000 Units Subcutaneous 3 times per day  . metronidazole  500 mg Intravenous Q8H  . nicotine  21 mg Transdermal Daily  . OxyCODONE  15 mg Oral Q12H  . phenytoin  200 mg Oral BID  . potassium chloride  40 mEq Oral TID  . protein supplement  1 scoop Oral TID WC  . saccharomyces boulardii  250 mg Oral BID  . vancomycin  500 mg Oral 4 times per day    Objective: Vital signs in last 24 hours: Temp:  [97.9 F (36.6 C)] 97.9 F (36.6 C) (12/01 0510) Pulse Rate:  [73-79] 79 (12/01 0510) Resp:  [18] 18 (12/01 0510) BP: (97-112)/(67-68) 97/67 mmHg (12/01 0510) SpO2:  [96 %-97 %] 97 % (12/01 0510) Weight:  [129 lb (58.514 kg)] 129 lb (58.514 kg) (11/30 2014) Physical Exam  Constitutional: He is oriented to person, place, and time. He appears well-developed and well-nourished. No distress. cachetic HENT:  Mouth/Throat: Oropharynx is clear and moist. No oropharyngeal exudate.  Cardiovascular: Normal rate, regular rhythm and normal heart sounds. Exam reveals no gallop and no friction rub.  No murmur heard.  Pulmonary/Chest: Effort normal and breath sounds  normal. No respiratory distress. He has no wheezes.  Abdominal: Soft. Bowel sounds are decreased. Mildly distended. Mildly TTP in LLQ Lymphadenopathy:  He has no cervical adenopathy.  Neurological: He is alert and oriented to person, place, and time.  Skin: Skin is warm and dry. No rash noted. No erythema.  Psychiatric: He is slightly anxious   Lab Results  Recent Labs  06/18/14 0610 06/19/14 0514  WBC 5.8 8.2  HGB 11.5* 12.4*  HCT 35.3* 37.9*  NA 137 134*  K 4.2 4.6  CL 100 93*  CO2 27 29  BUN 7 11  CREATININE 0.82 0.86   Liver Panel  Recent Labs  06/18/14 0610 06/19/14 0514  PROT 5.6* 5.9*  ALBUMIN 2.6* 2.8*  AST 33 37  ALT 18 22  ALKPHOS 155* 159*  BILITOT 0.3 0.4   Sedimentation Rate No results for input(s): ESRSEDRATE in the last 72 hours. C-Reactive Protein No results for input(s): CRP in the last 72 hours.  Microbiology: 11/24 c.difficile pcr positive  Studies/Results: No results found.   Assessment/Plan: C.difficile colitis = will change him to dificid and discontinue vancomycin. He may have better response to fidoxamicin. Alternatively, he may have an post infectious IBS like process. Can d/c Iv metrdonidazole  Protein-caloric malnutrition = agree with continued support  Abdominal cramping = will do a  trial of Jinny Sanders [facility] Cell: [phone] Pager: [phone]  06/20/2014, 2:59 PM

## 2014-06-20 NOTE — Plan of Care (Signed)
Problem: Phase II Progression Outcomes Goal: Vital signs remain stable Outcome: Progressing  Comments:  BP 113//70 HR 75

## 2014-06-21 LAB — BASIC METABOLIC PANEL
Anion gap: 13 (ref 5–15)
Anion gap: 12 (ref 5–15)
BUN: 18 mg/dL (ref 6–23)
BUN: 18 mg/dL (ref 6–23)
CALCIUM: 8.9 mg/dL (ref 8.4–10.5)
CO2: 27 mEq/L (ref 19–32)
CO2: 29 meq/L (ref 19–32)
Calcium: 8.7 mg/dL (ref 8.4–10.5)
Chloride: 91 mEq/L — ABNORMAL LOW (ref 96–112)
Chloride: 92 mEq/L — ABNORMAL LOW (ref 96–112)
Creatinine, Ser: 1.06 mg/dL (ref 0.50–1.35)
Creatinine, Ser: 1.04 mg/dL (ref 0.50–1.35)
GFR calc Af Amer: 82 mL/min — ABNORMAL LOW (ref >90)
GFR calc Af Amer: 84 mL/min — ABNORMAL LOW (ref >90)
GFR calc non Af Amer: 72 mL/min — ABNORMAL LOW (ref >90)
GFR, EST NON AFRICAN AMERICAN: 71 mL/min — AB (ref >90)
GLUCOSE: 113 mg/dL — AB (ref 70–99)
GLUCOSE: 111 mg/dL — AB (ref 70–99)
POTASSIUM: 5.6 meq/L — AB (ref 3.7–5.3)
POTASSIUM: 5.2 meq/L (ref 3.7–5.3)
SODIUM: 133 meq/L — AB (ref 137–147)
Sodium: 131 mEq/L — ABNORMAL LOW (ref 137–147)

## 2014-06-21 MED ORDER — FIDAXOMICIN 200 MG PO TABS
200.0000 mg | ORAL_TABLET | Freq: Two times a day (BID) | ORAL | Status: DC
  Administered 2014-06-21 – 2014-07-03 (×24): 200 mg via ORAL
  Filled 2014-06-21 (×25): qty 1

## 2014-06-21 MED ORDER — RESOURCE INSTANT PROTEIN PO PWD PACKET
1.0000 | Freq: Two times a day (BID) | ORAL | Status: DC
  Administered 2014-06-21 – 2014-07-03 (×21): 6 g via ORAL
  Filled 2014-06-21 (×30): qty 6

## 2014-06-21 MED ORDER — SODIUM CHLORIDE 0.9 % IV SOLN
INTRAVENOUS | Status: DC
  Administered 2014-06-21 – 2014-06-22 (×2): via INTRAVENOUS
  Administered 2014-06-27: 75 mL/h via INTRAVENOUS
  Administered 2014-06-27 – 2014-06-28 (×2): via INTRAVENOUS
  Administered 2014-06-29: 75 mL/h via INTRAVENOUS
  Administered 2014-06-29 – 2014-07-01 (×3): via INTRAVENOUS
  Administered 2014-07-02: 1000 mL via INTRAVENOUS
  Administered 2014-07-03 (×2): via INTRAVENOUS

## 2014-06-21 MED ORDER — FIDAXOMICIN 200 MG PO TABS
200.0000 mg | ORAL_TABLET | Freq: Two times a day (BID) | ORAL | Status: DC
  Filled 2014-06-21 (×2): qty 1

## 2014-06-21 MED FILL — Sodium Bicarbonate IV Soln 8.4%: INTRAVENOUS | Qty: 150 | Status: AC

## 2014-06-21 MED FILL — Potassium Chloride Inj 2 mEq/ML: INTRAVENOUS | Qty: 10 | Status: AC

## 2014-06-21 MED FILL — Water For IV Injection: INTRAVENOUS | Qty: 850 | Status: AC

## 2014-06-21 NOTE — Progress Notes (Addendum)
Patient ID: Joshua Reed, male   DOB: [birth year], 68 y.o.   MRN: [id number]  [facility] PROGRESS NOTE  LONNEL GJERDE VOJ:[id number] DOB: [birth year] DOA: 06/13/2014 PCP: Gennette Pac, MD  Brief narrative:    68 y.o. year old male with COPD, ETOH abuse, seizure disorder, recent R distal radius and right hip fracture s/p fall presented with persistent watery diarrhea since discharge. Patient noted to have been recently discharged from short-term rehabilitation (11/09 - 11/17). Last colonoscopy w/ [facility] last year that was normal per pt.   Assessment/Plan:   Severe c diff Diarrhea - initially treated with oral vancomycin and IV Flagyl, both ABX started 11/16 and now d/c per ID rec's - pt started on fidoxamicin 12/01 - pt continues to improve with less diarrhea  Anion gap metabolic acidosis - likely secondary to above, resolved  - provided IV fluid with bicarbonate with potassium - stop potassium supplementation as now on high side - repeat BMP in AM Hyponatremia - most likely secondary to the above as well - na remains stable 131 - 135 Hypokalemia - stop supplementing as K 5.6 this AM - repeat BMP in AM - pt with no chest pain this AM ETOH use - currently denies - CIWA  COPD  - stable  - no resp distress/cough/wheezing Right distal radius/R hip fx  - 6 x 4 cm postoperative right gluteal hematoma. - Not a concern in the setting of stable hemoglobin Protein calorie malnutrition - Nutrition consultation obtained   DVT prophylaxis: Heparin SQ   Code Status: Full.  Family Communication:  plan of care discussed with the patient Disposition Plan: Home when stable.   IV access:   Peripheral IV Procedures and diagnostic studies:     None  Medical Consultants:   ID Other Consultants:   None  IAnti-Infectives:    Flagyl IV 11/26 --> 12/01  Vancomycin PO 11/26 --> 12/01  Fidaxomicin 12/01 -->  Ha Placeres, Dedra Skeens, MD  [facility] Pager  [phone]  If 7PM-7AM, please contact night-coverage [email or web address] Password [email or web address] 06/21/2014, 9:25 AM   LOS: 8 days    HPI/Subjective: No acute overnight events.  Objective: Filed Vitals:   06/20/14 0510 06/20/14 1454 06/20/14 2156 06/21/14 0533  BP: 97/67 113/70 115/70 92/68  Pulse: 79 75 79 78  Temp: 97.9 F (36.6 C) 97.5 F (36.4 C) 98.2 F (36.8 C) 98.3 F (36.8 C)  TempSrc: Oral Oral Oral Oral  Resp: 18 18 18 18   Height:      Weight:      SpO2: 97% 97% 95% 95%    Intake/Output Summary (Last 24 hours) at 06/21/14 0925 Last data filed at 06/20/14 1814  Gross per 24 hour  Intake 890.83 ml  Output      0 ml  Net 890.83 ml    Exam:   General:  Pt is alert, follows commands appropriately, not in acute distress  Cardiovascular: Regular rate and rhythm, S1/S2  Respiratory: Clear to auscultation bilaterally, no wheezing, no crackles, no rhonchi  Abdomen: Soft, non tender, non distended, bowel sounds present  Extremities: No edema, pulses DP and PT palpable bilaterally  Neuro: Grossly nonfocal  Data Reviewed: Basic Metabolic Panel:  Recent Labs Lab 06/16/14 0440 06/17/14 0519 06/18/14 0610 06/19/14 0514 06/21/14 0435  NA 135* 137 137 134* 131*  K 3.6* 3.2* 4.2 4.6 5.6*  CL 104 99 100 93* 91*  CO2 19 25 27 29 27   GLUCOSE 96 99 96 99 113*  BUN 6 6  7 11 18   CREATININE 0.91 0.90 0.82 0.86 1.06  CALCIUM 8.3* 8.1* 8.2* 8.2* 8.9   Liver Function Tests:  Recent Labs Lab 06/15/14 0525 06/16/14 0440 06/17/14 0519 06/18/14 0610 06/19/14 0514  AST 25 25 30  33 37  ALT 17 15 18 18 22   ALKPHOS 178* 175* 172* 155* 159*  BILITOT 0.3 0.4 0.4 0.3 0.4  PROT 6.2 5.7* 5.8* 5.6* 5.9*  ALBUMIN 2.7* 2.6* 2.7* 2.6* 2.8*   CBC:  Recent Labs Lab 06/15/14 0525 06/16/14 0440 06/17/14 0519 06/18/14 0610 06/19/14 0514  WBC 10.6* 7.0 7.0 5.8 8.2  NEUTROABS 8.0* 4.6 4.9 3.6 5.5  HGB 11.3* 11.4* 11.3* 11.5* 12.4*  HCT 35.6* 35.6* 34.7* 35.3* 37.9*  MCV 93.2  92.0 91.3 90.3 90.7  PLT 398 327 305 287 285   Recent Results (from the past 240 hour(s))  Clostridium Difficile by PCR     Status: Abnormal   Collection Time: 06/13/14  9:04 PM  Result Value Ref Range Status   C difficile by pcr POSITIVE (A) NEGATIVE Final  Stool culture     Status: None   Collection Time: 06/13/14  9:04 PM  Result Value Ref Range Status   Specimen Description STOOL  Final   Special Requests Normal  Final   Culture   Final    NO SALMONELLA, SHIGELLA, CAMPYLOBACTER, YERSINIA, OR E.COLI 0157:H7 ISOLATED   Report Status 06/17/2014 FINAL  Final  Ova and parasite examination     Status: None   Collection Time: 06/14/14  6:54 AM  Result Value Ref Range Status   Specimen Description STOOL  Final   Special Requests NONE  Final   Ova and parasites   Final    NO OVA OR PARASITES SEEN   Report Status 06/16/2014 FINAL  Final  Clostridium Difficile by PCR     Status: Abnormal   Collection Time: 06/14/14 11:28 AM  Result Value Ref Range Status   C difficile by pcr POSITIVE (A) NEGATIVE Final     Scheduled Meds: . aspirin  325 mg Oral BID  . dicyclomine  10 mg Oral TID AC  . fidaxomicin  200 mg Oral BID  . heparin  5,000 Units Subcutaneous 3 times per day  . OxyCODONE  15 mg Oral Q12H  . phenytoin  200 mg Oral BID   Continuous Infusions: . sodium chloride

## 2014-06-21 NOTE — Progress Notes (Signed)
[facility] for [facility]ious [facility]    Date of Admission:  06/13/2014   Total days of antibiotics 9        Day 2 dificid                  ID: Joshua Reed is a 68 y.o. male with c.difficile colitisActive Problems:   Seizure disorder   COPD (chronic obstructive pulmonary disease)   Alcohol abuse   Vitamin D deficiency   Macular degeneration   Distal radius fracture, right   Intertrochanteric fracture of right hip   C. difficile colitis   Metabolic acidosis   Protein-calorie malnutrition, severe   Poor dentition    Subjective: Afebrile, still having very frequentf diarrhea, but only received 2 doses of dificid. He states that there is some improvement with abdominal cramping.  Medications:  . aspirin  325 mg Oral BID  . cholecalciferol  1,000 Units Oral Daily  . dicyclomine  10 mg Oral TID AC  . fidaxomicin  200 mg Oral BID  . heparin  5,000 Units Subcutaneous 3 times per day  . nicotine  21 mg Transdermal Daily  . OxyCODONE  15 mg Oral Q12H  . phenytoin  200 mg Oral BID  . protein supplement  1 scoop Oral BID WC  . saccharomyces boulardii  250 mg Oral BID    Objective: Vital signs in last 24 hours: Temp:  [98 F (36.7 C)-98.3 F (36.8 C)] 98 F (36.7 C) (12/02 1442) Pulse Rate:  [77-79] 77 (12/02 1442) Resp:  [18] 18 (12/02 1442) BP: (92-116)/(68-77) 116/77 mmHg (12/02 1442) SpO2:  [95 %-99 %] 99 % (12/02 1442) Physical Exam  Constitutional: He is oriented to person, place, and time. He appears well-developed and well-nourished. No distress. cachetic HENT:  Mouth/Throat: Oropharynx is clear and moist. No oropharyngeal exudate.  Cardiovascular: Normal rate, regular rhythm and normal heart sounds. Exam reveals no gallop and no friction rub.  No murmur heard.  Pulmonary/Chest: Effort normal and breath sounds normal. No respiratory distress. He has no wheezes.  Abdominal: Soft. Bowel sounds are decreased. Mildly distended. Mildly TTP in  LLQ Lymphadenopathy:  He has no cervical adenopathy.  Neurological: He is alert and oriented to person, place, and time.  Skin: Skin is warm and dry. No rash noted. No erythema.  Psychiatric: He is slightly anxious   Lab Results  Recent Labs  06/19/14 0514 06/21/14 0435 06/21/14 1107  WBC 8.2  --   --   HGB 12.4*  --   --   HCT 37.9*  --   --   NA 134* 131* 133*  K 4.6 5.6* 5.2  CL 93* 91* 92*  CO2 29 27 29   BUN 11 18 18   CREATININE 0.86 1.06 1.04   Liver Panel  Recent Labs  06/19/14 0514  PROT 5.9*  ALBUMIN 2.8*  AST 37  ALT 22  ALKPHOS 159*  BILITOT 0.4   Sedimentation Rate No results for input(s): ESRSEDRATE in the last 72 hours. C-Reactive Protein No results for input(s): CRP in the last 72 hours.  Microbiology: 11/24 c.difficile pcr positive  Studies/Results: No results found.   Assessment/Plan: C.difficile colitis =  Continue on dificid 200mg  BID. Will do a 10 day course. Still too early to tell if having improvement with dificid. i suspect that he might have concominant post-infectious irritable bowel syndrome.  Protein-caloric malnutrition = agree with continued support  Abdominal cramping = continue on  Bentyl since it appears to  be improving  Baxter Flattery [facility] for Infectious Diseases Cell: [phone] Pager: [phone]  06/21/2014, 4:08 PM

## 2014-06-22 ENCOUNTER — Inpatient Hospital Stay (HOSPITAL_COMMUNITY): Payer: Worker's Compensation

## 2014-06-22 DIAGNOSIS — E875 Hyperkalemia: Secondary | ICD-10-CM | POA: Insufficient documentation

## 2014-06-22 DIAGNOSIS — R197 Diarrhea, unspecified: Secondary | ICD-10-CM | POA: Insufficient documentation

## 2014-06-22 DIAGNOSIS — F1010 Alcohol abuse, uncomplicated: Secondary | ICD-10-CM

## 2014-06-22 DIAGNOSIS — E46 Unspecified protein-calorie malnutrition: Secondary | ICD-10-CM

## 2014-06-22 DIAGNOSIS — E43 Unspecified severe protein-calorie malnutrition: Secondary | ICD-10-CM

## 2014-06-22 LAB — BASIC METABOLIC PANEL
Anion gap: 18 — ABNORMAL HIGH (ref 5–15)
BUN: 19 mg/dL (ref 6–23)
CALCIUM: 8.5 mg/dL (ref 8.4–10.5)
CHLORIDE: 95 meq/L — AB (ref 96–112)
CO2: 17 meq/L — AB (ref 19–32)
CREATININE: 0.95 mg/dL (ref 0.50–1.35)
GFR calc Af Amer: >90 mL/min (ref >90)
GFR calc non Af Amer: 84 mL/min — ABNORMAL LOW (ref >90)
GLUCOSE: 107 mg/dL — AB (ref 70–99)
Potassium: 5.4 mEq/L — ABNORMAL HIGH (ref 3.7–5.3)
Sodium: 130 mEq/L — ABNORMAL LOW (ref 137–147)

## 2014-06-22 LAB — CBC
HEMATOCRIT: 44.1 % (ref 39.0–52.0)
Hemoglobin: 14.2 g/dL (ref 13.0–17.0)
MCH: 30 pg (ref 26.0–34.0)
MCHC: 32.2 g/dL (ref 30.0–36.0)
MCV: 93.2 fL (ref 78.0–100.0)
Platelets: 163 10*3/uL (ref 150–400)
RBC: 4.73 MIL/uL (ref 4.22–5.81)
RDW: 18 % — ABNORMAL HIGH (ref 11.5–15.5)
WBC: 10.7 10*3/uL — AB (ref 4.0–10.5)

## 2014-06-22 MED ORDER — SODIUM BICARBONATE 650 MG PO TABS
650.0000 mg | ORAL_TABLET | Freq: Every day | ORAL | Status: DC
  Administered 2014-06-22 – 2014-06-26 (×5): 650 mg via ORAL
  Filled 2014-06-22 (×5): qty 1

## 2014-06-22 MED ORDER — SODIUM POLYSTYRENE SULFONATE 15 GM/60ML PO SUSP
15.0000 g | Freq: Once | ORAL | Status: AC
  Administered 2014-06-22: 15 g via ORAL
  Filled 2014-06-22: qty 60

## 2014-06-22 NOTE — Plan of Care (Signed)
Problem: Consults Goal: General Medical Patient Education See Patient Education Module for specific education.  Outcome: Completed/Met Date Met:  06/22/14 Goal: Skin Care Protocol Initiated - if Braden Score 18 or less If consults are not indicated, leave blank or document N/A  Outcome: Completed/Met Date Met:  06/22/14 Goal: Nutrition Consult-if indicated Outcome: Completed/Met Date Met:  06/22/14 Goal: Diabetes Guidelines if Diabetic/Glucose > 140 If diabetic or lab glucose is > 140 mg/dl - Initiate Diabetes/Hyperglycemia Guidelines & Document Interventions  Outcome: Not Applicable Date Met:  99/69/24  Problem: Phase I Progression Outcomes Goal: Pain controlled with appropriate interventions Outcome: Completed/Met Date Met:  06/22/14 Goal: OOB as tolerated unless otherwise ordered Outcome: Completed/Met Date Met:  06/22/14 Goal: Initial discharge plan identified Outcome: Completed/Met Date Met:  06/22/14 Goal: Voiding-avoid urinary catheter unless indicated Outcome: Completed/Met Date Met:  06/22/14 Goal: Hemodynamically stable Outcome: Completed/Met Date Met:  06/22/14  Problem: Phase II Progression Outcomes Goal: Progress activity as tolerated unless otherwise ordered Outcome: Completed/Met Date Met:  06/22/14

## 2014-06-22 NOTE — Progress Notes (Signed)
CARE MANAGEMENT NOTE 06/22/2014  Patient:  Joshua Reed, Joshua Reed   Account Number:  [id number]  Date Initiated:  06/16/2014  Documentation initiated by:  Edwyna Shell  Subjective/Objective Assessment:   68 yo male admitted with C Diff from home with spouse     Action/Plan:   discharge planning   Anticipated DC Date:  06/18/2014   Anticipated DC Plan:  [place]  CM consult      Choice offered to / List presented to:             Status of service:  In process, will continue to follow Medicare Important Message given?   (If response is "NO", the following Medicare IM given date fields will be blank) Date Medicare IM given:   Medicare IM given by:   Date Additional Medicare IM given:   Additional Medicare IM given by:    Discharge Disposition:    Per UR Regulation:    If discussed at Long Length of Stay Meetings, dates discussed:    Comments:  06/21/14 Edwyna Shell RN BSN CM [phone] Contacted worker's comp CM, [name], and communicated that the patient does not have any HH needs upon discharge. Also discussed discharge antibiotic recommendation and requested coverage information for Dificid. [name] also requested information if recommended to have an outside company come in to the home to disinfect prior to discharge and Dr. Baxter Flattery stated that she has cpoken with the spouse and educated her on the cleaning process and to defer that decision to the spouse. This CM then spoke with the patient and spouse and the spouse stated that she felt confident discinfecting the home and that there was not a need for a service to come in to the house. This information was communicated to [place] and she then confirmed that the Dificid regimen would be covered by worker's comp. This Cm then contacted [facility] pharmacy on [address], the patient preferred pharmacy, and they stated that they do not currently have the Dificid in stock but will order it and  have it in the store within 24 hours.  06/20/14 Edwyna Shell RN BSN CM [phone] Patient lives at home with spouse and has a BSC and walker. He stated that he did receive HHC PT, OT, RN in the home but does not feel that he needs these services resumed upon discharge because he feels he is able to manage with support of his wife in the home.  06/19/14 Edwyna Shell RN BSN CM [phone] Spoke with Worker's Comp case Freight forwarder, [name], and she stated that the patient was previously followed in home by [facility] PT and OT but she is unaware which company. [name] stated that she will find out the [facility] agency and communicate to this CM. [name] contact [phone]. No orders, will continue to follow  06/16/14 [place] [phone] No orders, no CM needs identified, will continue to follow

## 2014-06-22 NOTE — Progress Notes (Signed)
Patient ID: Joshua Reed, male   DOB: [birth year], 68 y.o.   MRN: [id number] [facility] PROGRESS NOTE  Joshua Reed HLK:[id number] DOB: 01-04-1946 DOA: 06/13/2014 PCP: Joshua Pac, MD  Brief narrative:    68 y.o. year old male with COPD, ETOH abuse, seizure disorder, recent R distal radius and right hip fracture s/p fall presented with persistent watery diarrhea since discharge. Patient noted to have been recently discharged from short-term rehabilitation (11/09 - 11/17). Last colonoscopy w/ [facility] last year that was normal per pt.   Assessment/Plan:    Principal Problem: Severe c diff Diarrhea - initially treated with oral vancomycin and IV Flagyl, both ABX started 11/16 and now d/c per ID rec's - pt started on fidoxamicin 12/01 - pt still with high stool output about  - continue IV fluids, NS @ 50 cc/hr  Active Problems: Anion gap metabolic acidosis - likely secondary to above - Initially resolved but acidosis evident again today with bicarbonate of 17. Will supplement with sodium bicarbonate while patient has diarrhea. Hyponatremia - most likely secondary to the GI losses, diarrhea - Sodium is 130 this morning. Continue to monitor. Hypokalemia / hyperkalemia - Potassium initially supplemented and now hyperkalemia. Potassium was 5.4 this morning  - Give low dose Kayexalate once.  - Follow-up BMP in the morning.  ETOH use - currently denies - CIWA  COPD  - stable  - no resp distress/cough/wheezing Right distal radius/R hip fx  - 6 x 4 cm postoperative right gluteal hematoma. - Not a concern in the setting of stable hemoglobin Protein calorie malnutrition - Nutrition consultation obtained   DVT prophylaxis:  - Heparin SQ   Code Status: Full.  Family Communication: plan of care discussed with the patient and his wife at the bedside Disposition Plan: Home when stable.    IV access:   Peripheral IV  Procedures and diagnostic studies:     No results found.  Medical Consultants:   Infectious disease (Dr. Carlyle Reed)  Other Consultants:   None   IAnti-Infectives:    Flagyl IV 11/26 --> 12/01  Vancomycin PO 11/26 --> 12/01  Fidaxomicin 12/01 -->   Joshua Lenz, MD  [facility] Pager [phone]  If 7PM-7AM, please contact night-coverage [email or web address] Password [email or web address] 06/22/2014, 8:59 AM   LOS: 9 days    HPI/Subjective: No acute overnight events.  Objective: Filed Vitals:   06/21/14 0533 06/21/14 1442 06/21/14 2110 06/22/14 0524  BP: 92/68 116/77 108/70 110/68  Pulse: 78 77 75 66  Temp: 98.3 F (36.8 C) 98 F (36.7 C) 97.6 F (36.4 C) 97.3 F (36.3 C)  TempSrc: Oral Oral Oral Oral  Resp: 18 18 18 18   Height:      Weight:      SpO2: 95% 99% 98% 100%    Intake/Output Summary (Last 24 hours) at 06/22/14 0859 Last data filed at 06/22/14 0700  Gross per 24 hour  Intake 1602.51 ml  Output      0 ml  Net 1602.51 ml    Exam:   General:  Pt is not in acute distress  Cardiovascular: Regular rate and rhythm, S1/S2 appreciated   Respiratory: no wheezing, no crackles, no rhonchi  Abdomen: Soft, non tender, non distended, bowel sounds present  Extremities: pulses DP and PT palpable bilaterally  Neuro: nonfocal  Data Reviewed: Basic Metabolic Panel:  Recent Labs Lab 06/18/14 0610 06/19/14 0514 06/21/14 0435 06/21/14 1107 06/22/14 0435  NA 137 134* 131* 133* 130*  K 4.2 4.6  5.6* 5.2 5.4*  CL 100 93* 91* 92* 95*  CO2 27 29 27 29  17*  GLUCOSE 96 99 113* 111* 107*  BUN 7 11 18 18 19   CREATININE 0.82 0.86 1.06 1.04 0.95  CALCIUM 8.2* 8.2* 8.9 8.7 8.5   Liver Function Tests:  Recent Labs Lab 06/16/14 0440 06/17/14 0519 06/18/14 0610 06/19/14 0514  AST 25 30 33 37  ALT 15 18 18 22   ALKPHOS 175* 172* 155* 159*  BILITOT 0.4 0.4 0.3 0.4  PROT 5.7* 5.8* 5.6* 5.9*  ALBUMIN 2.6* 2.7* 2.6* 2.8*   No results for input(s): LIPASE, AMYLASE in the last 168 hours. No results  for input(s): AMMONIA in the last 168 hours. CBC:  Recent Labs Lab 06/16/14 0440 06/17/14 0519 06/18/14 0610 06/19/14 0514 06/22/14 0435  WBC 7.0 7.0 5.8 8.2 10.7*  NEUTROABS 4.6 4.9 3.6 5.5  --   HGB 11.4* 11.3* 11.5* 12.4* 14.2  HCT 35.6* 34.7* 35.3* 37.9* 44.1  MCV 92.0 91.3 90.3 90.7 93.2  PLT 327 305 287 285 163   Cardiac Enzymes: No results for input(s): CKTOTAL, CKMB, CKMBINDEX, TROPONINI in the last 168 hours. BNP: Invalid input(s): POCBNP CBG: No results for input(s): GLUCAP in the last 168 hours.  Clostridium Difficile by PCR     Status: Abnormal   Collection Time: 06/13/14  9:04 PM  Result Value Ref Range Status   C difficile by pcr POSITIVE (A) NEGATIVE Final  Stool culture     Status: None   Collection Time: 06/13/14  9:04 PM  Result Value Ref Range Status   Specimen Description STOOL  Final   Special Requests Normal  Final   Culture   Final    NO SALMONELLA, SHIGELLA, CAMPYLOBACTER, YERSINIA, OR E.COLI 0157:H7 ISOLATED Performed at [facility]    Report Status 06/17/2014 FINAL  Final  Ova and parasite examination     Status: None   Collection Time: 06/14/14  6:54 AM  Result Value Ref Range Status   Specimen Description STOOL  Final   Special Requests NONE  Final   Ova and parasites   Final    NO OVA OR PARASITES SEEN Performed at [facility]    Report Status 06/16/2014 FINAL  Final  Clostridium Difficile by PCR     Status: Abnormal   Collection Time: 06/14/14 11:28 AM  Result Value Ref Range Status   C difficile by pcr POSITIVE (A) NEGATIVE Final     Scheduled Meds: . aspirin  325 mg Oral BID  . cholecalciferol  1,000 Units Oral Daily  . dicyclomine  10 mg Oral TID AC  . fidaxomicin  200 mg Oral BID  . heparin  5,000 Units Subcutaneous 3 times per day  . nicotine  21 mg Transdermal Daily  . OxyCODONE  15 mg Oral Q12H  . phenytoin  200 mg Oral BID  . protein supplement  1 scoop Oral BID WC  . saccharomyces boulardii  250  mg Oral BID   Continuous Infusions: . sodium chloride 50 mL/hr at 06/22/14 [id number]

## 2014-06-22 NOTE — Progress Notes (Signed)
    [name] for Infectious Disease    Date of Admission:  06/13/2014   Total days of antibiotics 10        Day 3 dificid                  ID: Joshua Reed is a 68 y.o. male with c.difficile colitisActive Problems:   Seizure disorder   COPD (chronic obstructive pulmonary disease)   Alcohol abuse   Vitamin D deficiency   Macular degeneration   Distal radius fracture, right   Intertrochanteric fracture of right hip   C. difficile colitis   Metabolic acidosis   Protein-calorie malnutrition, severe   Poor dentition    Subjective: Afebrile, still having very frequent diarrhea but only 10 instead of 14 , however he has been receiving kayexelate for his hyper K+. Did sleep for 4 hr last night, which he reports is a good night  Medications:  . aspirin  325 mg Oral BID  . cholecalciferol  1,000 Units Oral Daily  . dicyclomine  10 mg Oral TID AC  . fidaxomicin  200 mg Oral BID  . heparin  5,000 Units Subcutaneous 3 times per day  . nicotine  21 mg Transdermal Daily  . OxyCODONE  15 mg Oral Q12H  . phenytoin  200 mg Oral BID  . protein supplement  1 scoop Oral BID WC  . saccharomyces boulardii  250 mg Oral BID  . sodium bicarbonate  650 mg Oral Daily    Objective: Vital signs in last 24 hours: Temp:  [97.3 F (36.3 C)-97.6 F (36.4 C)] 97.3 F (36.3 C) (12/03 0524) Pulse Rate:  [66-75] 66 (12/03 0524) Resp:  [18] 18 (12/03 0524) BP: (108-110)/(68-70) 110/68 mmHg (12/03 0524) SpO2:  [98 %-100 %] 100 % (12/03 0524) Physical Exam  Constitutional: He is oriented to person, place, and time. He appears well-developed and well-nourished. No distress. cachetic Sitting on the bedside commode   Lab Results  Recent Labs  06/21/14 1107 06/22/14 0435  WBC  --  10.7*  HGB  --  14.2  HCT  --  44.1  NA 133* 130*  K 5.2 5.4*  CL 92* 95*  CO2 29 17*  BUN 18 19  CREATININE 1.04 0.95   Liver Panel No results for input(s): PROT, ALBUMIN, AST, ALT, ALKPHOS, BILITOT,  BILIDIR, IBILI in the last 72 hours. Sedimentation Rate No results for input(s): ESRSEDRATE in the last 72 hours. C-Reactive Protein No results for input(s): CRP in the last 72 hours.  Microbiology: 11/24 c.difficile pcr positive  Studies/Results: No results found.   Assessment/Plan: C.difficile colitis =  Continue on dificid 200mg  BID. Will do a 10 day course. Still too early to tell if having improvement with dificid. i suspect that he might have concominant post-infectious irritable bowel syndrome. Continue with bentyl, since it seems to help with his abdominal crampling. Difficult to tell if his diarrhea is improved since he is receiving kayexelate  HyperK+ = i have asked them not to drink as much gatorade since it does have potassium. May need to do low potassium diet, and hold off on gatorade.  Protein-caloric malnutrition = agree with continued support  Abdominal cramping = continue on  Bentyl since it appears to be improving  [facility] for Infectious Diseases Cell: [phone] Pager: [phone]  06/22/2014, 3:08 PM

## 2014-06-23 DIAGNOSIS — R109 Unspecified abdominal pain: Secondary | ICD-10-CM

## 2014-06-23 DIAGNOSIS — Z113 Encounter for screening for infections with a predominantly sexual mode of transmission: Secondary | ICD-10-CM | POA: Insufficient documentation

## 2014-06-23 LAB — GI PATHOGEN PANEL BY PCR, STOOL
C difficile toxin A/B: NEGATIVE
Campylobacter by PCR: NEGATIVE
Cryptosporidium by PCR: NEGATIVE
E COLI (ETEC) LT/ST: NEGATIVE
E COLI (STEC): NEGATIVE
E coli 0157 by PCR: NEGATIVE
G lamblia by PCR: NEGATIVE
NOROVIRUS G1/G2: NEGATIVE
Rotavirus A by PCR: NEGATIVE
SHIGELLA BY PCR: NEGATIVE
Salmonella by PCR: NEGATIVE

## 2014-06-23 LAB — BASIC METABOLIC PANEL
Anion gap: 14 (ref 5–15)
BUN: 15 mg/dL (ref 6–23)
CALCIUM: 8.3 mg/dL — AB (ref 8.4–10.5)
CHLORIDE: 99 meq/L (ref 96–112)
CO2: 18 meq/L — AB (ref 19–32)
CREATININE: 0.83 mg/dL (ref 0.50–1.35)
GFR calc Af Amer: >90 mL/min (ref >90)
GFR calc non Af Amer: 89 mL/min — ABNORMAL LOW (ref >90)
GLUCOSE: 117 mg/dL — AB (ref 70–99)
Potassium: 3.9 mEq/L (ref 3.7–5.3)
Sodium: 131 mEq/L — ABNORMAL LOW (ref 137–147)

## 2014-06-23 MED ORDER — CHOLESTYRAMINE 4 G PO PACK
4.0000 g | PACK | ORAL | Status: DC
  Administered 2014-06-23 – 2014-06-29 (×19): 4 g via ORAL
  Filled 2014-06-23 (×22): qty 1

## 2014-06-23 MED ORDER — FIDAXOMICIN 200 MG PO TABS
200.0000 mg | ORAL_TABLET | Freq: Two times a day (BID) | ORAL | Status: DC
Start: 2014-06-23 — End: 2014-07-02

## 2014-06-23 NOTE — Progress Notes (Addendum)
Joshua Reed for Infectious Disease  Day # 4 dificid  Subjective: Still co abd pain, loose stools though has had SEVEN today vs MORE THAN TWENTY yesterday  Antibiotics:  Anti-infectives    Start     Dose/Rate Route Frequency Ordered Stop   06/23/14 0000  fidaxomicin (DIFICID) 200 MG TABS tablet     200 mg Oral 2 times daily 06/23/14 1010     06/21/14 2200  fidaxomicin (DIFICID) tablet 200 mg     200 mg Oral 2 times daily 06/21/14 1042     06/21/14 1200  fidaxomicin (DIFICID) tablet 200 mg  Status:  Discontinued     200 mg Oral 2 times daily 06/21/14 1037 06/21/14 1042   06/20/14 2200  fidaxomicin (DIFICID) tablet 200 mg  Status:  Discontinued     200 mg Oral 2 times daily 06/20/14 1704 06/21/14 0935   06/19/14 1000  metroNIDAZOLE (FLAGYL) IVPB 500 mg  Status:  Discontinued     500 mg100 mL/hr over 60 Minutes Intravenous Every 8 hours 06/19/14 0938 06/20/14 1704   06/15/14 1330  vancomycin (VANCOCIN) 50 mg/mL oral solution 500 mg  Status:  Discontinued     500 mg Oral 4 times per day 06/15/14 1324 06/20/14 1704   06/15/14 1330  metroNIDAZOLE (FLAGYL) IVPB 500 mg  Status:  Discontinued     500 mg100 mL/hr over 60 Minutes Intravenous Every 8 hours 06/15/14 1324 06/18/14 1053   06/14/14 1400  metroNIDAZOLE (FLAGYL) tablet 500 mg  Status:  Discontinued     500 mg Oral 3 times per day 06/14/14 0954 06/15/14 1324   06/14/14 0330  ciprofloxacin (CIPRO) IVPB 400 mg  Status:  Discontinued     400 mg200 mL/hr over 60 Minutes Intravenous Every 12 hours 06/14/14 0317 06/14/14 0954   06/14/14 0330  metroNIDAZOLE (FLAGYL) IVPB 500 mg  Status:  Discontinued     500 mg100 mL/hr over 60 Minutes Intravenous Every 8 hours 06/14/14 0317 06/14/14 0954      Medications: Scheduled Meds: . aspirin  325 mg Oral BID  . cholecalciferol  1,000 Units Oral Daily  . cholestyramine  4 g Oral 3 times per day  . dicyclomine  10 mg Oral TID AC  . fidaxomicin  200 mg Oral BID  . heparin  5,000 Units  Subcutaneous 3 times per day  . nicotine  21 mg Transdermal Daily  . OxyCODONE  15 mg Oral Q12H  . phenytoin  200 mg Oral BID  . protein supplement  1 scoop Oral BID WC  . saccharomyces boulardii  250 mg Oral BID  . sodium bicarbonate  650 mg Oral Daily   Continuous Infusions: . sodium chloride 50 mL/hr at 06/22/14 0631   PRN Meds:.acetaminophen, ondansetron (ZOFRAN) IV, oxyCODONE, zolpidem    Objective: Weight change:   Intake/Output Summary (Last 24 hours) at 06/23/14 1610 Last data filed at 06/23/14 0837  Gross per 24 hour  Intake    240 ml  Output      1 ml  Net    239 ml   Blood pressure 113/65, pulse 71, temperature 97.6 F (36.4 C), temperature source Oral, resp. rate 16, height 5\' 9"  (1.753 m), weight 129 lb (58.514 kg), SpO2 100 %. Temp:  [97.5 F (36.4 C)-97.6 F (36.4 C)] 97.6 F (36.4 C) (12/04 1329) Pulse Rate:  [67-71] 71 (12/04 1329) Resp:  [16] 16 (12/04 1329) BP: (110-113)/(63-65) 113/65 mmHg (12/04 1329) SpO2:  [97 %-100 %] 100 % (12/04  1329)  Physical Exam: General: Alert and awake, oriented x3, underweight HEENT:pupils reactive to light and accommodation, EOMI CVS tachy rate,  Chest: no wheezing Abdomen: distended, tender throughout Extremities: no  clubbing or edema noted bilaterally Skin: no rashes Neuro: nonfocal  CBC:  CBC Latest Ref Rng 06/22/2014 06/19/2014 06/18/2014  WBC 4.0 - 10.5 K/uL 10.7(H) 8.2 5.8  Hemoglobin 13.0 - 17.0 g/dL 14.2 12.4(L) 11.5(L)  Hematocrit 39.0 - 52.0 % 44.1 37.9(L) 35.3(L)  Platelets 150 - 400 K/uL 163 285 287      BMET  Recent Labs  06/22/14 0435 06/23/14 1115  NA 130* 131*  K 5.4* 3.9  CL 95* 99  CO2 17* 18*  GLUCOSE 107* 117*  BUN 19 15  CREATININE 0.95 0.83  CALCIUM 8.5 8.3*     Liver Panel  No results for input(s): PROT, ALBUMIN, AST, ALT, ALKPHOS, BILITOT, BILIDIR, IBILI in the last 72 hours.     Sedimentation Rate No results for input(s): ESRSEDRATE in the last 72  hours. C-Reactive Protein No results for input(s): CRP in the last 72 hours.  Micro Results: Recent Results (from the past 720 hour(s))  Urine culture     Status: None   Collection Time: 05/29/14  7:08 PM  Result Value Ref Range Status   Specimen Description URINE, CLEAN CATCH  Final   Special Requests NONE  Final   Culture  Setup Time   Final    05/30/2014 02:34 Performed at [place] Performed at [facility]   Final   Culture NO GROWTH Performed at [facility]   Final   Report Status 05/31/2014 FINAL  Final  Clostridium Difficile by PCR     Status: None   Collection Time: 06/05/14  5:31 PM  Result Value Ref Range Status   C difficile by pcr NEGATIVE NEGATIVE Final  Clostridium Difficile by PCR     Status: Abnormal   Collection Time: 06/13/14  9:04 PM  Result Value Ref Range Status   C difficile by pcr POSITIVE (A) NEGATIVE Final    Comment: PERFORMED AT [place], READ BACK BY AND VERIFIED WITH: Meade Maw AT [id number] 06/14/14 BY [place] Performed at [facility]   Stool culture     Status: None   Collection Time: 06/13/14  9:04 PM  Result Value Ref Range Status   Specimen Description STOOL  Final   Special Requests Normal  Final   Culture   Final    NO SALMONELLA, SHIGELLA, CAMPYLOBACTER, YERSINIA, OR E.COLI 0157:H7 ISOLATED Performed at [facility]    Report Status 06/17/2014 FINAL  Final  Ova and parasite examination     Status: None   Collection Time: 06/14/14  6:54 AM  Result Value Ref Range Status   Specimen Description STOOL  Final   Special Requests NONE  Final   Ova and parasites   Final    NO OVA OR PARASITES SEEN Performed at [facility]    Report Status 06/16/2014 FINAL  Final  Clostridium Difficile by PCR     Status: Abnormal   Collection Time: 06/14/14 11:28 AM  Result Value Ref Range Status   C difficile by pcr POSITIVE (A) NEGATIVE  Final    Comment: CRITICAL RESULT CALLED TO, READ BACK BY AND VERIFIED WITH: DUMAS,N RN @ 1406 06/14/14 LEONARD,A Performed at [facility]     Studies/Results: Dg Abd 2 Views  06/22/2014   CLINICAL DATA:  Abdominal distension, diarrhea, history COPD  EXAM: ABDOMEN - 2 VIEW  COMPARISON:  CT abdomen and pelvis 06/13/2014  FINDINGS: Scattered gas throughout nondistended large and small bowel loops.  No bowel dilatation, bowel wall thickening, or free intraperitoneal air.  Lung bases hyperinflated but clear.  Bones demineralized with dextro convex thoracolumbar scoliosis and multilevel degenerative disc disease changes.  No urinary tract calcification.  Orthopedic hardware proximal RIGHT femur.  IMPRESSION: Nonobstructive bowel gas pattern.   Electronically Signed   By: Lavonia Dana M.D.   On: 06/22/2014 15:08      Assessment/Plan:  Active Problems:   Seizure disorder   COPD (chronic obstructive pulmonary disease)   Alcohol abuse   Vitamin D deficiency   Macular degeneration   Distal radius fracture, right   Intertrochanteric fracture of right hip   C. difficile colitis   Metabolic acidosis   Protein-calorie malnutrition, severe   Poor dentition   Diarrhea   Hyperkalemia    Joshua Reed is a 68 y.o. male with difficult to treat CDI  #1 Clostridium difficile colitis:  --continue DIFICID and would send him home with 10 ADDITIONAL days IF and when he becomes well enough to go home --IF HE WORSENS OVER WEEKEND WOULD ASK GI FOR FLEX SIG (Dr Baxter Flattery and Dr. Carlean Purl had discussed this case today and felt this was next step to take  I am NOT a big fan of the Questran and I would not try to send him home on it due to dosing complexity  Rectal vancomycin enemas could also be tried but I would not feel need to start them at this point  AVOID PPI, H2Blockers  I spent greater than 40 minutes with the patient including greater than 50% of time in face to face counsel of the  patient and in coordination of their care.   #2 Screening: check HIV and Hep panel with am labs   LOS: 10 days   Alcide Evener 06/23/2014, 4:10 PM

## 2014-06-23 NOTE — Progress Notes (Addendum)
Patient ID: Joshua Reed, male   DOB: 1945/11/10, 68 y.o.   MRN: [id number] [facility] PROGRESS NOTE  Joshua Reed NIO:[id number] DOB: [birth year] DOA: 06/13/2014 PCP: Joshua Pac, MD  Brief narrative:    68 y.o. year old male with COPD, ETOH abuse, seizure disorder, recent R distal radius and right hip fracture s/p fall presented with persistent watery diarrhea since discharge. Patient noted to have been recently discharged from short-term rehabilitation (11/09 - 11/17). Last colonoscopy w/ [facility] last year that was normal per pt.  Patient hospital course is complicated with ongoing perfuse diarrhea. Patient is currently on fidoxamicin started no 06/20/2014.  Assessment/Plan:    Principal Problem: Severe C diff Diarrhea / leukocytosis - initially treated with oral vancomycin and IV Flagyl, both ABX started 11/16 and now d/c per ID rec's - started on fidoxamicin 12/01 - pt still with high stool output about  - continue IV fluids, NS @ 50 cc/hr  - We will check with pharmacy if Questran or Sandostatin contraindicated with C. difficile and if not will start those 2 medications to see if diarrhea improves Active Problems: Anion gap metabolic acidosis - likely secondary to above - Initially resolved but acidosis evident again 06/22/2014 with bicarbonate of 17. Supplemented with sodium bicarbonate while patient has diarrhea. - Follow-up BMP this morning Hyponatremia - most likely secondary to the GI losses, diarrhea - Follow-up BMP this morning Hypokalemia / hyperkalemia - Potassium initially supplemented and now hyperkalemia. Potassium was 5.4 this morning  - Give low dose Kayexalate once.  - Follow-up BMP this morning ETOH use - currently denies - CIWA  COPD  - stable  - no resp distress/cough/wheezing Right distal radius/R hip fx  - 6 x 4 cm postoperative right gluteal hematoma. - Not a concern in the setting of stable hemoglobin Protein calorie  malnutrition, severe - Nutrition consultation obtained   DVT prophylaxis: - Heparin SQ   Code Status: Full.  Family Communication: plan of care discussed with the patient and his wife at the bedside Disposition Plan: Home when stable.    IV access:   Peripheral IV  Procedures and diagnostic studies:    Dg Abd 2 Views 06/22/2014 Nonobstructive bowel gas pattern.   Electronically Signed   By: Lavonia Dana M.D.   On: 06/22/2014 15:08   Medical Consultants:   Infectious disease (Dr. Carlyle Basques)   Other Consultants:   Nutrition   IAnti-Infectives:    Flagyl IV 11/26 --> 12/01  Vancomycin PO 11/26 --> 12/01  Fidaxomicin 12/01 -->   [name], MD  [facility] Pager [phone]  If 7PM-7AM, please contact night-coverage [email or web address] Password [email or web address] 06/23/2014, 10:21 AM   LOS: 10 days    HPI/Subjective: No acute overnight events.  Objective: Filed Vitals:   06/21/14 2110 06/22/14 0524 06/22/14 1512 06/22/14 2144  BP: 108/70 110/68 132/74 110/63  Pulse: 75 66 69 67  Temp: 97.6 F (36.4 C) 97.3 F (36.3 C) 97.7 F (36.5 C) 97.5 F (36.4 C)  TempSrc: Oral Oral  Oral  Resp: 18 18 16 16   Height:      Weight:      SpO2: 98% 100% 100% 97%    Intake/Output Summary (Last 24 hours) at 06/23/14 1021 Last data filed at 06/23/14 0837  Gross per 24 hour  Intake    880 ml  Output      1 ml  Net    879 ml    Exam:   General:  Pt is  not in acute distress  Cardiovascular: Regular rate and rhythm, S1/S2 appreciated   Respiratory: no wheezing, no crackles, no rhonchi  Abdomen: tender in mid abdomen, non distended, bowel sounds present  Extremities: No edema, pulses DP and PT palpable bilaterally  Neuro: Grossly nonfocal  Data Reviewed: Basic Metabolic Panel:  Recent Labs Lab 06/18/14 0610 06/19/14 0514 06/21/14 0435 06/21/14 1107 06/22/14 0435  NA 137 134* 131* 133* 130*  K 4.2 4.6 5.6* 5.2 5.4*  CL 100 93* 91* 92* 95*  CO2 27 29 27  29  17*  GLUCOSE 96 99 113* 111* 107*  BUN 7 11 18 18 19   CREATININE 0.82 0.86 1.06 1.04 0.95  CALCIUM 8.2* 8.2* 8.9 8.7 8.5   Liver Function Tests:  Recent Labs Lab 06/17/14 0519 06/18/14 0610 06/19/14 0514  AST 30 33 37  ALT 18 18 22   ALKPHOS 172* 155* 159*  BILITOT 0.4 0.3 0.4  PROT 5.8* 5.6* 5.9*  ALBUMIN 2.7* 2.6* 2.8*   No results for input(s): LIPASE, AMYLASE in the last 168 hours. No results for input(s): AMMONIA in the last 168 hours. CBC:  Recent Labs Lab 06/17/14 0519 06/18/14 0610 06/19/14 0514 06/22/14 0435  WBC 7.0 5.8 8.2 10.7*  NEUTROABS 4.9 3.6 5.5  --   HGB 11.3* 11.5* 12.4* 14.2  HCT 34.7* 35.3* 37.9* 44.1  MCV 91.3 90.3 90.7 93.2  PLT 305 287 285 163   Cardiac Enzymes: No results for input(s): CKTOTAL, CKMB, CKMBINDEX, TROPONINI in the last 168 hours. BNP: Invalid input(s): POCBNP CBG: No results for input(s): GLUCAP in the last 168 hours.  Recent Results (from the past 240 hour(s))  Clostridium Difficile by PCR     Status: Abnormal   Collection Time: 06/13/14  9:04 PM  Result Value Ref Range Status   C difficile by pcr POSITIVE (A) NEGATIVE Final    Comment: PERFORMED AT [place] TO, READ BACK BY AND VERIFIED WITH: Meade Maw AT [id number] 06/14/14 BY [place] Performed at [facility]   Stool culture     Status: None   Collection Time: 06/13/14  9:04 PM  Result Value Ref Range Status   Specimen Description STOOL  Final   Special Requests Normal  Final   Culture   Final    NO SALMONELLA, SHIGELLA, CAMPYLOBACTER, YERSINIA, OR E.COLI 0157:H7 ISOLATED Performed at [facility]    Report Status 06/17/2014 FINAL  Final  Ova and parasite examination     Status: None   Collection Time: 06/14/14  6:54 AM  Result Value Ref Range Status   Specimen Description STOOL  Final   Special Requests NONE  Final   Ova and parasites   Final    NO OVA OR PARASITES SEEN Performed at [facility]     Report Status 06/16/2014 FINAL  Final  Clostridium Difficile by PCR     Status: Abnormal   Collection Time: 06/14/14 11:28 AM  Result Value Ref Range Status   C difficile by pcr POSITIVE (A) NEGATIVE Final    Comment: CRITICAL RESULT CALLED TO, READ BACK BY AND VERIFIED WITH: DUMAS,N RN @ 1406 06/14/14 LEONARD,A Performed at [facility]      Scheduled Meds: . aspirin  325 mg Oral BID  . cholecalciferol  1,000 Units Oral Daily  . dicyclomine  10 mg Oral TID AC  . fidaxomicin  200 mg Oral BID  . heparin  5,000 Units Subcutaneous 3 times per day  .  nicotine  21 mg Transdermal Daily  . OxyCODONE  15 mg Oral Q12H  . phenytoin  200 mg Oral BID  . protein supplement  1 scoop Oral BID WC  . saccharomyces boulardii  250 mg Oral BID  . sodium bicarbonate  650 mg Oral Daily   Continuous Infusions: . sodium chloride 50 mL/hr at 06/22/14 [phone]

## 2014-06-23 NOTE — Progress Notes (Signed)
06/23/14 Edwyna Shell RN BSN CM [phone] Received call from Worker Comp CM [name] requesting an update on patient status, and possible discharge date. [name] requested that the prescription for Dificid be provided to the patient spouse today so she can take it to the pharmacy and [name] will follow up to make sure worker comp insurance covers med so that if the patient is discharged over the weekend he has all discharge needs met. This CM gave the hard copy prescription for the Dificid to the patient spouse and faxed a copy to [employer]. This CM then received call from [place] that the [facility] is rejecting med. This CM then provided Teresa's contact #, [phone], to the pharmacist for follow up assistance with coverage. Will continue to follow

## 2014-06-23 NOTE — Progress Notes (Signed)
NUTRITION FOLLOW UP/Consult  Intervention:   -Resource Breeze po BID, each supplement provides 250 kcal and 9 grams of protein - D/C Boost Plus - D/C Magic Cup BID - Provided pt with education and food list- "High Potassium Foods." Used teach-back method.   Nutrition Dx:   Inadequate oral intake related to diarrhea as evidenced by wt loss and poor po, ongoing  Goal:   Pt to meet >/= 90% of their estimated nutrition needs; not met  Monitor:   Weight trend, po and supplemental intake, labs  Assessment:   68 y.o. year old male with significant past medical history of COPD, ETOH abuse, seizure disorder, recent R distal radius and right hip fracture s/p fall presenting with diarrhea.  - RD consulted for diet education. RD provided pt with list of food containing high and low levels of potassium. Educated pt using teach-back method.  - Pt reports that his po intake is fair. He says that his wife brings him food from [place] [employer] and home. Pt does not like nutritional supplements. He agreed to try [employer]. Pt continues to have diarrhea. He says that he sometimes becomes nauseated after eating. Pt says he is mostly able to tolerate fruit and beans. Advised pt that beans and some fruits are high in potassium and should be avoided. Referred pt to handout.   Height: Ht Readings from Last 1 Encounters:  06/19/14 '5\' 9"'  (1.753 m)    Weight Status:   Wt Readings from Last 1 Encounters:  06/19/14 129 lb (58.514 kg)    Re-estimated needs:  Kcal: 1500-1700 Protein: 85-95 g Fluid: >1.7 L/day  Skin: intact  Diet Order: Diet regular   Intake/Output Summary (Last 24 hours) at 06/23/14 1206 Last data filed at 06/23/14 0837  Gross per 24 hour  Intake    680 ml  Output      1 ml  Net    679 ml    Last BM: 12/4, watery   Labs:   Recent Labs Lab 06/21/14 0435 06/21/14 1107 06/22/14 0435  NA 131* 133* 130*  K 5.6* 5.2 5.4*  CL 91* 92* 95*  CO2 27 29 17*  BUN '18  18 19  ' CREATININE 1.06 1.04 0.95  CALCIUM 8.9 8.7 8.5  GLUCOSE 113* 111* 107*    CBG (last 3)  No results for input(s): GLUCAP in the last 72 hours.  Scheduled Meds: . aspirin  325 mg Oral BID  . cholecalciferol  1,000 Units Oral Daily  . cholestyramine  4 g Oral 3 times per day  . dicyclomine  10 mg Oral TID AC  . fidaxomicin  200 mg Oral BID  . heparin  5,000 Units Subcutaneous 3 times per day  . nicotine  21 mg Transdermal Daily  . OxyCODONE  15 mg Oral Q12H  . phenytoin  200 mg Oral BID  . protein supplement  1 scoop Oral BID WC  . saccharomyces boulardii  250 mg Oral BID  . sodium bicarbonate  650 mg Oral Daily    Continuous Infusions: . sodium chloride 50 mL/hr at 06/22/14 0631    Laurette Schimke MS, RD, LDN

## 2014-06-24 DIAGNOSIS — E860 Dehydration: Secondary | ICD-10-CM | POA: Insufficient documentation

## 2014-06-24 DIAGNOSIS — R531 Weakness: Secondary | ICD-10-CM | POA: Insufficient documentation

## 2014-06-24 LAB — BASIC METABOLIC PANEL
Anion gap: 11 (ref 5–15)
BUN: 18 mg/dL (ref 6–23)
CALCIUM: 8.5 mg/dL (ref 8.4–10.5)
CO2: 18 mEq/L — ABNORMAL LOW (ref 19–32)
Chloride: 102 mEq/L (ref 96–112)
Creatinine, Ser: 0.87 mg/dL (ref 0.50–1.35)
GFR calc Af Amer: >90 mL/min (ref >90)
GFR, EST NON AFRICAN AMERICAN: 87 mL/min — AB (ref >90)
GLUCOSE: 104 mg/dL — AB (ref 70–99)
Potassium: 4 mEq/L (ref 3.7–5.3)
SODIUM: 131 meq/L — AB (ref 137–147)

## 2014-06-24 LAB — HIV ANTIBODY (ROUTINE TESTING W REFLEX): HIV 1&2 Ab, 4th Generation: NONREACTIVE

## 2014-06-24 LAB — CBC
HCT: 39.9 % (ref 39.0–52.0)
Hemoglobin: 12.8 g/dL — ABNORMAL LOW (ref 13.0–17.0)
MCH: 30.3 pg (ref 26.0–34.0)
MCHC: 32.1 g/dL (ref 30.0–36.0)
MCV: 94.3 fL (ref 78.0–100.0)
Platelets: 127 10*3/uL — ABNORMAL LOW (ref 150–400)
RBC: 4.23 MIL/uL (ref 4.22–5.81)
RDW: 18.3 % — AB (ref 11.5–15.5)
WBC: 10.3 10*3/uL (ref 4.0–10.5)

## 2014-06-24 LAB — HEPATITIS PANEL, ACUTE
HCV AB: NEGATIVE
HEP A IGM: NONREACTIVE
HEP B S AG: NEGATIVE
Hep B C IgM: NONREACTIVE

## 2014-06-24 MED ORDER — DICYCLOMINE HCL 20 MG PO TABS
20.0000 mg | ORAL_TABLET | Freq: Four times a day (QID) | ORAL | Status: DC
  Administered 2014-06-24 – 2014-07-03 (×32): 20 mg via ORAL
  Filled 2014-06-24 (×37): qty 1

## 2014-06-24 MED ORDER — DICYCLOMINE HCL 10 MG PO CAPS
20.0000 mg | ORAL_CAPSULE | Freq: Four times a day (QID) | ORAL | Status: DC
  Filled 2014-06-24: qty 2

## 2014-06-24 MED ORDER — LOPERAMIDE HCL 2 MG PO CAPS
2.0000 mg | ORAL_CAPSULE | Freq: Two times a day (BID) | ORAL | Status: DC
  Administered 2014-06-24 – 2014-06-25 (×3): 2 mg via ORAL
  Filled 2014-06-24 (×7): qty 1

## 2014-06-24 NOTE — Consult Note (Signed)
Subjective:   HPI  The patient is a 68 year old male who we are asked to see in regards to ongoing diarrhea in the face of recent Clostridium difficile infection. The patient fell last month and fractured his hip and wrist. He underwent surgery for this and received a prophylactic dose of antibiotic. Just before leaving the hospital he started to experience diarrhea. The diarrhea persisted and he was found to have a positive Clostridium difficile. This was around November 24. Stool culture and ova and parasites were negative. Because of ongoing diarrhea he was readmitted to the hospital. He has been treated with IV Flagyl and oral vancomycin. Because of persistent diarrhea he was then started on Dificid on December 1. He is still experiencing multiple loose stools. He denies seeing blood in the stool. The volume of stool varies from a very small amount to a larger amount. He has abdominal cramping and spasm. He is also on Questran 4 g 3 times a day, Bentyl 10 mg 3 times a day before meals, and Florastor daily. He continues to experience diarrhea. A most recent GI pathogens panel from December 4 is negative including the Clostridium difficile. Patient states that he had a colonoscopy last year and it was negative.  Review of Systems Denies chest pain or shortness of breath  Past Medical History  Diagnosis Date  . Seizure disorder 2205    ALCOHOL RELATED  . Macular degeneration   . COPD (chronic obstructive pulmonary disease)   . Alcohol abuse   . Vitamin D deficiency    Past Surgical History  Procedure Laterality Date  . Cardiac catheterization  2002    normal  . Open reduction internal fixation (orif) distal radial fracture Right 05/24/2014    Procedure: OPEN REDUCTION INTERNAL FIXATION (ORIF) DISTAL RADIAL FRACTURE;  Surgeon: Marianna Payment, MD;  Location: [place];  Service: Orthopedics;  Laterality: Right;  . Intramedullary (im) nail intertrochanteric Right 05/24/2014    Procedure:  INTRAMEDULLARY (IM) NAIL INTERTROCHANTRIC;  Surgeon: Marianna Payment, MD;  Location: [place];  Service: Orthopedics;  Laterality: Right;   History   Social History  . Marital Status: Married    Spouse Name: N/A    Number of Children: N/A  . Years of Education: N/A   Occupational History  . Not on file.   Social History Main Topics  . Smoking status: Former Smoker    Types: Cigarettes    Quit date: 05/07/2011  . Smokeless tobacco: Never Used  . Alcohol Use: 2.4 oz/week    4 Cans of beer per week     Comment: quit 2-3  years ago  . Drug Use: No  . Sexual Activity: Not on file   Other Topics Concern  . Not on file   Social History Narrative   family history includes Heart attack in his father. Current facility-administered medications: 0.9 %  sodium chloride infusion, , Intravenous, Continuous, Robbie Lis, MD, Last Rate: 50 mL/hr at 06/22/14 0631;  acetaminophen (TYLENOL) tablet 325-650 mg, 325-650 mg, Oral, Q4H PRN, Shanda Howells, MD;  aspirin tablet 325 mg, 325 mg, Oral, BID, Shanda Howells, MD, 325 mg at 06/24/14 1014 cholecalciferol (VITAMIN D) tablet 1,000 Units, 1,000 Units, Oral, Daily, Shanda Howells, MD, 1,000 Units at 06/24/14 1015;  cholestyramine (QUESTRAN) packet 4 g, 4 g, Oral, 3 times per day, Robbie Lis, MD, 4 g at 06/24/14 1259;  dicyclomine (BENTYL) capsule 10 mg, 10 mg, Oral, TID AC, Carlyle Basques, MD, 10 mg at 06/24/14 1259;  fidaxomicin (DIFICID) tablet 200 mg, 200 mg, Oral, BID, Robbie Lis, MD, 200 mg at 06/24/14 1015 heparin injection 5,000 Units, 5,000 Units, Subcutaneous, 3 times per day, Shanda Howells, MD, 5,000 Units at 06/24/14 1259;  nicotine (NICODERM CQ - dosed in mg/24 hours) patch 21 mg, 21 mg, Transdermal, Daily, Shanda Howells, MD, 21 mg at 06/24/14 1014;  ondansetron [facility]) injection 4 mg, 4 mg, Intravenous, Q6H PRN, Reyne Dumas, MD, 4 mg at 06/15/14 1716 oxyCODONE (Oxy IR/ROXICODONE) immediate release tablet 10-15 mg, 10-15 mg, Oral, Q6H  PRN, Shanda Howells, MD, 15 mg at 06/24/14 0553;  OxyCODONE (OXYCONTIN) 12 hr tablet 15 mg, 15 mg, Oral, Q12H, Shanda Howells, MD, 15 mg at 06/24/14 1015;  phenytoin (DILANTIN) ER capsule 200 mg, 200 mg, Oral, BID, Shanda Howells, MD, 200 mg at 06/24/14 1015 protein supplement (RESOURCE BENEPROTEIN) powder packet 6 g, 1 scoop, Oral, BID WC, Gardiner Barefoot, NP, 6 g at 06/24/14 0800;  saccharomyces boulardii (FLORASTOR) capsule 250 mg, 250 mg, Oral, BID, Reyne Dumas, MD, 250 mg at 06/24/14 1015;  sodium bicarbonate tablet 650 mg, 650 mg, Oral, Daily, Robbie Lis, MD, 650 mg at 06/24/14 1015;  zolpidem (AMBIEN) tablet 5 mg, 5 mg, Oral, QHS PRN, Reyne Dumas, MD Allergies  Allergen Reactions  . Acyclovir And Related Hives     Objective:     BP 112/63 mmHg  Pulse 99  Temp(Src) 97.6 F (36.4 C) (Axillary)  Resp 18  Ht 5\' 9"  (1.753 m)  Wt 58.514 kg (129 lb)  BMI 19.04 kg/m2  SpO2 100%  He is in no distress  Heart regular rhythm  Abdomen: Bowel sounds present, soft, nontender  Laboratory No components found for: D1    Assessment:     Persistent diarrhea in a patient with recent Clostridium difficile. He is currently on Dificid. His last stool study which was the GI pathogens panel was reported on December 4 to be negative, including the C. difficile toxin by PCR.  He may have a post infectious IBS with diarrhea. I have seen this before and it may take some time to resolve. I would recommend finishing the course of Dificid. Symptomatic treatment for the diarrhea I think is in order now. I would continue with Questran, Florastor, and Bentyl, but would increase the dose of Bentyl to 20mg  po qid. since the C. difficile has been eradicated based on most recent GI pathogens panel I think it would be reasonable to try some Imodium also on a when necessary basis.      Plan:     See above

## 2014-06-24 NOTE — Progress Notes (Addendum)
[facility]          Day # 5  dificid  Subjective: Still co abd pain, loose stools count yesterday was upon further reflection and the patient 's "stooling log" much more than 7 more like >18 and he is continuing to have mx loose stools today  Antibiotics:  Anti-infectives    Start     Dose/Rate Route Frequency Ordered Stop   06/23/14 0000  fidaxomicin (DIFICID) 200 MG TABS tablet     200 mg Oral 2 times daily 06/23/14 1010     06/21/14 2200  fidaxomicin (DIFICID) tablet 200 mg     200 mg Oral 2 times daily 06/21/14 1042     06/21/14 1200  fidaxomicin (DIFICID) tablet 200 mg  Status:  Discontinued     200 mg Oral 2 times daily 06/21/14 1037 06/21/14 1042   06/20/14 2200  fidaxomicin (DIFICID) tablet 200 mg  Status:  Discontinued     200 mg Oral 2 times daily 06/20/14 1704 06/21/14 0935   06/19/14 1000  metroNIDAZOLE (FLAGYL) IVPB 500 mg  Status:  Discontinued     500 mg100 mL/hr over 60 Minutes Intravenous Every 8 hours 06/19/14 0938 06/20/14 1704   06/15/14 1330  vancomycin (VANCOCIN) 50 mg/mL oral solution 500 mg  Status:  Discontinued     500 mg Oral 4 times per day 06/15/14 1324 06/20/14 1704   06/15/14 1330  metroNIDAZOLE (FLAGYL) IVPB 500 mg  Status:  Discontinued     500 mg100 mL/hr over 60 Minutes Intravenous Every 8 hours 06/15/14 1324 06/18/14 1053   06/14/14 1400  metroNIDAZOLE (FLAGYL) tablet 500 mg  Status:  Discontinued     500 mg Oral 3 times per day 06/14/14 0954 06/15/14 1324   06/14/14 0330  ciprofloxacin (CIPRO) IVPB 400 mg  Status:  Discontinued     400 mg200 mL/hr over 60 Minutes Intravenous Every 12 hours 06/14/14 0317 06/14/14 0954   06/14/14 0330  metroNIDAZOLE (FLAGYL) IVPB 500 mg  Status:  Discontinued     500 mg100 mL/hr over 60 Minutes Intravenous Every 8 hours 06/14/14 0317 06/14/14 0954      Medications: Scheduled Meds: . aspirin  325 mg Oral BID  . cholecalciferol  1,000 Units Oral Daily  . cholestyramine  4 g  Oral 3 times per day  . dicyclomine  10 mg Oral TID AC  . fidaxomicin  200 mg Oral BID  . heparin  5,000 Units Subcutaneous 3 times per day  . nicotine  21 mg Transdermal Daily  . OxyCODONE  15 mg Oral Q12H  . phenytoin  200 mg Oral BID  . protein supplement  1 scoop Oral BID WC  . saccharomyces boulardii  250 mg Oral BID  . sodium bicarbonate  650 mg Oral Daily   Continuous Infusions: . sodium chloride 50 mL/hr at 06/22/14 0631   PRN Meds:.acetaminophen, ondansetron (ZOFRAN) IV, oxyCODONE, zolpidem    Objective: Weight change:   Intake/Output Summary (Last 24 hours) at 06/24/14 1556 Last data filed at 06/24/14 1400  Gross per 24 hour  Intake   2430 ml  Output      0 ml  Net   2430 ml   Blood pressure 112/63, pulse 99, temperature 97.6 F (36.4 C), temperature source Axillary, resp. rate 18, height 5\' 9"  (1.753 m), weight 129 lb (58.514 kg), SpO2 100 %. Temp:  [97.2 F (36.2 C)-97.6 F (36.4 C)] 97.6 F (36.4 C) (12/05 1519) Pulse  Rate:  [67-99] 99 (12/05 1519) Resp:  [16-18] 18 (12/05 1519) BP: (106-113)/(61-69) 112/63 mmHg (12/05 1519) SpO2:  [99 %-100 %] 100 % (12/05 6283)  Physical Exam: General: Alert and awake, oriented x3, underweight HEENT:pupils reactive to light and accommodation, EOMI CVS tachy rate,  Chest: no wheezing Abdomen: distended, tender throughout Extremities: wearing cast Skin: no rashes Neuro: nonfocal  CBC:  CBC Latest Ref Rng 06/24/2014 06/22/2014 06/19/2014  WBC 4.0 - 10.5 K/uL 10.3 10.7(H) 8.2  Hemoglobin 13.0 - 17.0 g/dL 12.8(L) 14.2 12.4(L)  Hematocrit 39.0 - 52.0 % 39.9 44.1 37.9(L)  Platelets 150 - 400 K/uL 127(L) 163 285      BMET  Recent Labs  06/23/14 1115 06/24/14 1206  NA 131* 131*  K 3.9 4.0  CL 99 102  CO2 18* 18*  GLUCOSE 117* 104*  BUN 15 18  CREATININE 0.83 0.87  CALCIUM 8.3* 8.5     Liver Panel  No results for input(s): PROT, ALBUMIN, AST, ALT, ALKPHOS, BILITOT, BILIDIR, IBILI in the last 72  hours.     Sedimentation Rate No results for input(s): ESRSEDRATE in the last 72 hours. C-Reactive Protein No results for input(s): CRP in the last 72 hours.  Micro Results: Recent Results (from the past 720 hour(s))  Urine culture     Status: None   Collection Time: 05/29/14  7:08 PM  Result Value Ref Range Status   Specimen Description URINE, CLEAN CATCH  Final   Special Requests NONE  Final   Culture  Setup Time   Final    05/30/2014 02:34 Performed at [place] Performed at [facility]   Final   Culture NO GROWTH Performed at [facility]   Final   Report Status 05/31/2014 FINAL  Final  Clostridium Difficile by PCR     Status: None   Collection Time: 06/05/14  5:31 PM  Result Value Ref Range Status   C difficile by pcr NEGATIVE NEGATIVE Final  Clostridium Difficile by PCR     Status: Abnormal   Collection Time: 06/13/14  9:04 PM  Result Value Ref Range Status   C difficile by pcr POSITIVE (A) NEGATIVE Final    Comment: PERFORMED AT [place], READ BACK BY AND VERIFIED WITH: Meade Maw AT 6629 06/14/14 BY [place] Performed at [facility]   Stool culture     Status: None   Collection Time: 06/13/14  9:04 PM  Result Value Ref Range Status   Specimen Description STOOL  Final   Special Requests Normal  Final   Culture   Final    NO SALMONELLA, SHIGELLA, CAMPYLOBACTER, YERSINIA, OR E.COLI 0157:H7 ISOLATED Performed at [facility]    Report Status 06/17/2014 FINAL  Final  Ova and parasite examination     Status: None   Collection Time: 06/14/14  6:54 AM  Result Value Ref Range Status   Specimen Description STOOL  Final   Special Requests NONE  Final   Ova and parasites   Final    NO OVA OR PARASITES SEEN Performed at [facility]    Report Status 06/16/2014 FINAL  Final  Clostridium Difficile by PCR     Status: Abnormal   Collection Time:  06/14/14 11:28 AM  Result Value Ref Range Status   C difficile by pcr POSITIVE (A) NEGATIVE Final    Comment: CRITICAL RESULT CALLED TO, READ BACK BY AND VERIFIED  WITH: DUMAS,N RN @ 1406 06/14/14 LEONARD,A Performed at [facility]     Studies/Results: No results found.    Assessment/Plan:  Active Problems:   Seizure disorder   COPD (chronic obstructive pulmonary disease)   Alcohol abuse   Vitamin D deficiency   Macular degeneration   Distal radius fracture, right   Intertrochanteric fracture of right hip   C. difficile colitis   Metabolic acidosis   Protein-calorie malnutrition, severe   Poor dentition   Diarrhea   Hyperkalemia   Screening for STD (sexually transmitted disease)    Joshua Reed is a 68 y.o. male with difficult to treat CDI  #1 Clostridium difficile colitis:  --continue DIFICID and would send him home with 10 ADDITIONAL days IF and when he becomes well enough to go home  GI to see  I will consider adding back IV flagyl + rectal vancomycin enemas  AVOID PPI, H2Blockers  #2 Screening: check HIV and Hep panel pending.   LOS: 11 days   Alcide Evener 06/24/2014, 3:56 PM

## 2014-06-24 NOTE — Progress Notes (Signed)
Patient ID: Joshua Reed, male   DOB: 08/15/1945, 68 y.o.   MRN: [id number] [facility] PROGRESS NOTE  MORGON PAMER GYJ:[id number] DOB: [birth year] DOA: 06/13/2014 PCP: Gennette Pac, MD  Brief narrative:    68 y.o. year old male with COPD, ETOH abuse, seizure disorder, recent R distal radius and right hip fracture s/p fall presented with persistent watery diarrhea since discharge. Patient noted to have been recently discharged from short-term rehabilitation (11/09 - 11/17). Last colonoscopy w/ [facility] last year that was normal per pt.   Patient hospital course is complicated with ongoing perfuse diarrhea. Patient is currently on fidoxamicin started no 06/20/2014.  Assessment/Plan:   Principal Problem: Severe C diff Diarrhea / leukocytosis - initially treated with oral vancomycin and IV Flagyl, both ABX started 11/16 and now d/c per ID rec's - started on fidoxamicin 12/01 - pt still with high stool output about  - continue IV fluids, NS @ 50 cc/hr  - GI consulted for further assistance, appreciate recommendations  Active Problems: Anion gap metabolic acidosis - likely secondary to above - Initially resolved but acidosis evident again 06/22/2014 with bicarbonate of 17 - continue sodium bicarbonate for now  - Follow-up BMP this morning Hyponatremia - most likely secondary to the GI losses, diarrhea Hypokalemia / hyperkalemia - Potassium initially supplemented and now hyperkalemia. Potassium was 5.4 (12/03) - Give low dose Kayexalate once.  - Follow-up BMP this morning ETOH use - currently denies - CIWA  COPD  - stable  - no resp distress/cough/wheezing Right distal radius/R hip fx  - 6 x 4 cm postoperative right gluteal hematoma. - Not a concern in the setting of stable hemoglobin Protein calorie malnutrition, severe - Nutrition consultation obtained  DVT prophylaxis: - Heparin SQ   Code Status: Full.  Family Communication: plan of care  discussed with the patient and his wife at the bedside Disposition Plan: Home when stable.   IV access:   Peripheral IV Procedures and diagnostic studies:    Dg Abd 2 Views 06/22/2014 Nonobstructive bowel gas pattern. Medical Consultants:   Infectious disease (Dr. Carlyle Basques)   GI IAnti-Infectives:    Flagyl IV 11/26 --> 12/01  Vancomycin PO 11/26 --> 12/01  Fidaxomicin 12/01 -->  Faye Ramsay, MD  [facility] Pager [phone]  If 7PM-7AM, please contact night-coverage [email or web address] Password [email or web address] 06/24/2014, 9:21 AM   LOS: 11 days   HPI/Subjective: No events overnight. Persistent diarrhea.   Objective: Filed Vitals:   06/22/14 2144 06/23/14 1329 06/23/14 2201 06/24/14 0632  BP: 110/63 113/65 106/69 113/61  Pulse: 67 71 67 80  Temp: 97.5 F (36.4 C) 97.6 F (36.4 C) 97.5 F (36.4 C) 97.2 F (36.2 C)  TempSrc: Oral Oral Oral Oral  Resp: 16 16 16 16   Height:      Weight:      SpO2: 97% 100% 99% 100%    Intake/Output Summary (Last 24 hours) at 06/24/14 8502 Last data filed at 06/24/14 0600  Gross per 24 hour  Intake   1950 ml  Output      0 ml  Net   1950 ml    Exam:   General:  Pt is alert, follows commands appropriately, not in acute distress  Cardiovascular: Regular rate and rhythm, S1/S2, no murmurs, no rubs, no gallops  Respiratory: Clear to auscultation bilaterally, no wheezing, no crackles, no rhonchi  Abdomen: Soft, non tender, non distended, bowel sounds present, no guarding  Extremities: No edema, pulses DP and PT palpable bilaterally  Neuro: Grossly nonfocal  Data Reviewed: Basic Metabolic Panel:  Recent Labs Lab 06/19/14 0514 06/21/14 0435 06/21/14 1107 06/22/14 0435 06/23/14 1115  NA 134* 131* 133* 130* 131*  K 4.6 5.6* 5.2 5.4* 3.9  CL 93* 91* 92* 95* 99  CO2 29 27 29  17* 18*  GLUCOSE 99 113* 111* 107* 117*  BUN 11 18 18 19 15   CREATININE 0.86 1.06 1.04 0.95 0.83  CALCIUM 8.2* 8.9 8.7 8.5 8.3*   Liver  Function Tests:  Recent Labs Lab 06/18/14 0610 06/19/14 0514  AST 33 37  ALT 18 22  ALKPHOS 155* 159*  BILITOT 0.3 0.4  PROT 5.6* 5.9*  ALBUMIN 2.6* 2.8*   CBC:  Recent Labs Lab 06/18/14 0610 06/19/14 0514 06/22/14 0435 06/24/14 0520  WBC 5.8 8.2 10.7* 10.3  NEUTROABS 3.6 5.5  --   --   HGB 11.5* 12.4* 14.2 12.8*  HCT 35.3* 37.9* 44.1 39.9  MCV 90.3 90.7 93.2 94.3  PLT 287 285 163 127*    Recent Results (from the past 240 hour(s))  Clostridium Difficile by PCR     Status: Abnormal   Collection Time: 06/14/14 11:28 AM  Result Value Ref Range Status   C difficile by pcr POSITIVE (A) NEGATIVE Final    Comment: CRITICAL RESULT CALLED TO, READ BACK BY AND VERIFIED WITH: DUMAS,N RN @ 1406 06/14/14 LEONARD,A Performed at [facility]      Scheduled Meds: . aspirin  325 mg Oral BID  . cholecalciferol  1,000 Units Oral Daily  . cholestyramine  4 g Oral 3 times per day  . dicyclomine  10 mg Oral TID AC  . fidaxomicin  200 mg Oral BID  . heparin  5,000 Units Subcutaneous 3 times per day  . nicotine  21 mg Transdermal Daily  . OxyCODONE  15 mg Oral Q12H  . phenytoin  200 mg Oral BID  . protein supplement  1 scoop Oral BID WC  . saccharomyces   250 mg Oral BID  . sodium bicarbonate  650 mg Oral Daily   Continuous Infusions: . sodium chloride 50 mL/hr at 06/22/14 [phone]

## 2014-06-25 DIAGNOSIS — R1084 Generalized abdominal pain: Secondary | ICD-10-CM | POA: Insufficient documentation

## 2014-06-25 DIAGNOSIS — R109 Unspecified abdominal pain: Secondary | ICD-10-CM | POA: Insufficient documentation

## 2014-06-25 LAB — BASIC METABOLIC PANEL
ANION GAP: 15 (ref 5–15)
BUN: 16 mg/dL (ref 6–23)
CHLORIDE: 100 meq/L (ref 96–112)
CO2: 14 mEq/L — ABNORMAL LOW (ref 19–32)
CREATININE: 0.86 mg/dL (ref 0.50–1.35)
Calcium: 8.8 mg/dL (ref 8.4–10.5)
GFR calc non Af Amer: 88 mL/min — ABNORMAL LOW (ref >90)
Glucose, Bld: 94 mg/dL (ref 70–99)
Potassium: 4.4 mEq/L (ref 3.7–5.3)
Sodium: 129 mEq/L — ABNORMAL LOW (ref 137–147)

## 2014-06-25 LAB — CBC
HCT: 42.5 % (ref 39.0–52.0)
Hemoglobin: 14.1 g/dL (ref 13.0–17.0)
MCH: 31.2 pg (ref 26.0–34.0)
MCHC: 33.2 g/dL (ref 30.0–36.0)
MCV: 94 fL (ref 78.0–100.0)
PLATELETS: 289 10*3/uL (ref 150–400)
RBC: 4.52 MIL/uL (ref 4.22–5.81)
RDW: 17.9 % — AB (ref 11.5–15.5)
WBC: 10.2 10*3/uL (ref 4.0–10.5)

## 2014-06-25 LAB — CLOSTRIDIUM DIFFICILE BY PCR: Toxigenic C. Difficile by PCR: NEGATIVE

## 2014-06-25 NOTE — Progress Notes (Signed)
Patient ID: Joshua Reed, male   DOB: 1945-08-07, 68 y.o.   MRN: [id number]  [facility] PROGRESS NOTE  Joshua Reed DOB: 1945-12-12 DOA: 06/13/2014 PCP: Gennette Pac, MD  Brief narrative:    68 y.o. year old male with COPD, ETOH abuse, seizure disorder, recent R distal radius and right hip fracture s/p fall presented with persistent watery diarrhea since discharge. Patient noted to have been recently discharged from short-term rehabilitation (11/09 - 11/17). Last colonoscopy w/ [facility] last year that was normal per pt.   Patient hospital course is complicated with ongoing perfuse diarrhea. Patient is currently on fidoxamicin started no 06/20/2014.  Assessment/Plan:   Principal Problem: Severe C diff Diarrhea / leukocytosis - initially treated with oral vancomycin and IV Flagyl, both ABX started 11/16 and now d/c per ID rec's - started on fidoxamicin 12/01 - pt still with high stool output but improved since yesterday  - continue IV fluids, NS @ 50 cc/hr  - appreciate GI team recommendations  Active Problems: Anion gap metabolic acidosis - likely secondary to above - Initially resolved but acidosis evident again 06/22/2014 with bicarbonate of 17 - continue sodium bicarbonate for now  - Follow-up BMP in morning Hyponatremia - most likely secondary to the GI losses, diarrhea Hypokalemia / hyperkalemia - Potassium initially supplemented and with resulting hyperkalemia. Potassium was 5.4 (12/03) - K is WNL this AM ETOH use - currently denies - CIWA  COPD  - stable  - no resp distress/cough/wheezing Right distal radius/R hip fx  - 6 x 4 cm postoperative right gluteal hematoma. - Not a concern in the setting of stable hemoglobin Protein calorie malnutrition, severe - Nutrition consultation obtained  DVT prophylaxis: - Heparin SQ   Code Status: Full.  Family Communication: plan of care discussed with the patient and his wife  at the bedside Disposition Plan: Home when stable.   IV access:   Peripheral IV Procedures and diagnostic studies:    Dg Abd 2 Views 06/22/2014 Nonobstructive bowel gas pattern. Medical Consultants:   Infectious disease (Dr. Carlyle Basques)   GI IAnti-Infectives:    Flagyl IV 11/26 --> 12/01  Vancomycin PO 11/26 --> 12/01  Fidaxomicin 12/01 -->  Joshua Ramsay, MD  [facility] Pager [phone]  If 7PM-7AM, please contact night-coverage [email or web address] Password [facility] 06/25/2014, 12:27 PM   LOS: 12 days   HPI/Subjective: No events overnight. & episodes of diarrhea since 5 this AM.   Objective: Filed Vitals:   06/24/14 0632 06/24/14 1519 06/24/14 2137 06/25/14 0515  BP: 113/61 112/63 121/68 99/66  Pulse: 80 99 64 76  Temp: 97.2 F (36.2 C) 97.6 F (36.4 C) 97.5 F (36.4 C) 97.5 F (36.4 C)  TempSrc: Oral Axillary Oral Oral  Resp: 16 18 18 18   Height:      Weight:      SpO2: 100%  100% 99%    Intake/Output Summary (Last 24 hours) at 06/25/14 1227 Last data filed at 06/25/14 0859  Gross per 24 hour  Intake    240 ml  Output      0 ml  Net    240 ml    Exam:   General:  Pt is alert, follows commands appropriately, not in acute distress  Cardiovascular: Regular rate and rhythm, S1/S2, no murmurs, no rubs, no gallops  Respiratory: Clear to auscultation bilaterally, no wheezing, no crackles, no rhonchi  Abdomen: Soft, non tender, non distended, bowel sounds present, no guarding  Extremities: No edema, pulses DP and  PT palpable bilaterally  Neuro: Grossly nonfocal  Data Reviewed: Basic Metabolic Panel:  Recent Labs Lab 06/21/14 1107 06/22/14 0435 06/23/14 1115 06/24/14 1206 06/25/14 0540  NA 133* 130* 131* 131* 129*  K 5.2 5.4* 3.9 4.0 4.4  CL 92* 95* 99 102 100  CO2 29 17* 18* 18* 14*  GLUCOSE 111* 107* 117* 104* 94  BUN 18 19 15 18 16   CREATININE 1.04 0.95 0.83 0.87 0.86  CALCIUM 8.7 8.5 8.3* 8.5 8.8   Liver Function  Tests:  Recent Labs Lab 06/19/14 0514  AST 37  ALT 22  ALKPHOS 159*  BILITOT 0.4  PROT 5.9*  ALBUMIN 2.8*   CBC:  Recent Labs Lab 06/19/14 0514 06/22/14 0435 06/24/14 0520 06/25/14 0540  WBC 8.2 10.7* 10.3 10.2  NEUTROABS 5.5  --   --   --   HGB 12.4* 14.2 12.8* 14.1  HCT 37.9* 44.1 39.9 42.5  MCV 90.7 93.2 94.3 94.0  PLT 285 163 127* 289   Scheduled Meds: . aspirin  325 mg Oral BID  . cholecalciferol  1,000 Units Oral Daily  . cholestyramine  4 g Oral 3 times per day  . dicyclomine  20 mg Oral QID  . fidaxomicin  200 mg Oral BID  . heparin  5,000 Units Subcutaneous 3 times per day  . loperamide  2 mg Oral BID  . nicotine  21 mg Transdermal Daily  . OxyCODONE  15 mg Oral Q12H  . phenytoin  200 mg Oral BID  . protein supplement  1 scoop Oral BID WC  . saccharomyces boulardii  250 mg Oral BID  . sodium bicarbonate  650 mg Oral Daily   Continuous Infusions: . sodium chloride 50 mL/hr at 06/22/14 [phone]

## 2014-06-25 NOTE — Progress Notes (Signed)
[facility]          Day # 6  dificid  Subjective:   When I visited him around noon he felt that he had very little improvement even with the imodium  Antibiotics:  Anti-infectives    Start     Dose/Rate Route Frequency Ordered Stop   06/23/14 0000  fidaxomicin (DIFICID) 200 MG TABS tablet     200 mg Oral 2 times daily 06/23/14 1010     06/21/14 2200  fidaxomicin (DIFICID) tablet 200 mg     200 mg Oral 2 times daily 06/21/14 1042     06/21/14 1200  fidaxomicin (DIFICID) tablet 200 mg  Status:  Discontinued     200 mg Oral 2 times daily 06/21/14 1037 06/21/14 1042   06/20/14 2200  fidaxomicin (DIFICID) tablet 200 mg  Status:  Discontinued     200 mg Oral 2 times daily 06/20/14 1704 06/21/14 0935   06/19/14 1000  metroNIDAZOLE (FLAGYL) IVPB 500 mg  Status:  Discontinued     500 mg100 mL/hr over 60 Minutes Intravenous Every 8 hours 06/19/14 0938 06/20/14 1704   06/15/14 1330  vancomycin (VANCOCIN) 50 mg/mL oral solution 500 mg  Status:  Discontinued     500 mg Oral 4 times per day 06/15/14 1324 06/20/14 1704   06/15/14 1330  metroNIDAZOLE (FLAGYL) IVPB 500 mg  Status:  Discontinued     500 mg100 mL/hr over 60 Minutes Intravenous Every 8 hours 06/15/14 1324 06/18/14 1053   06/14/14 1400  metroNIDAZOLE (FLAGYL) tablet 500 mg  Status:  Discontinued     500 mg Oral 3 times per day 06/14/14 0954 06/15/14 1324   06/14/14 0330  ciprofloxacin (CIPRO) IVPB 400 mg  Status:  Discontinued     400 mg200 mL/hr over 60 Minutes Intravenous Every 12 hours 06/14/14 0317 06/14/14 0954   06/14/14 0330  metroNIDAZOLE (FLAGYL) IVPB 500 mg  Status:  Discontinued     500 mg100 mL/hr over 60 Minutes Intravenous Every 8 hours 06/14/14 0317 06/14/14 0954      Medications: Scheduled Meds: . aspirin  325 mg Oral BID  . cholecalciferol  1,000 Units Oral Daily  . cholestyramine  4 g Oral 3 times per day  . dicyclomine  20 mg Oral QID  . fidaxomicin  200 mg Oral BID  .  heparin  5,000 Units Subcutaneous 3 times per day  . loperamide  2 mg Oral BID  . nicotine  21 mg Transdermal Daily  . OxyCODONE  15 mg Oral Q12H  . phenytoin  200 mg Oral BID  . protein supplement  1 scoop Oral BID WC  . saccharomyces boulardii  250 mg Oral BID  . sodium bicarbonate  650 mg Oral Daily   Continuous Infusions: . sodium chloride 50 mL/hr at 06/22/14 0631   PRN Meds:.acetaminophen, ondansetron (ZOFRAN) IV, oxyCODONE, zolpidem    Objective: Weight change:   Intake/Output Summary (Last 24 hours) at 06/25/14 1900 Last data filed at 06/25/14 1700  Gross per 24 hour  Intake    480 ml  Output      0 ml  Net    480 ml   Blood pressure 120/65, pulse 73, temperature 97.6 F (36.4 C), temperature source Oral, resp. rate 18, height 5\' 9"  (1.753 m), weight 129 lb (58.514 kg), SpO2 100 %. Temp:  [97.5 F (36.4 C)-97.6 F (36.4 C)] 97.6 F (36.4 C) (12/06 1300) Pulse Rate:  [64-76] 73 (12/06 1300)  Resp:  [18] 18 (12/06 0515) BP: (99-121)/(65-68) 120/65 mmHg (12/06 1300) SpO2:  [99 %-100 %] 100 % (12/06 1300)  Physical Exam: General: Alert and awake, oriented  Seated on bedside commode  CBC Latest Ref Rng 06/25/2014 06/24/2014 06/22/2014  WBC 4.0 - 10.5 K/uL 10.2 10.3 10.7(H)  Hemoglobin 13.0 - 17.0 g/dL 14.1 12.8(L) 14.2  Hematocrit 39.0 - 52.0 % 42.5 39.9 44.1  Platelets 150 - 400 K/uL 289 127(L) 163      BMET  Recent Labs  06/24/14 1206 06/25/14 0540  NA 131* 129*  K 4.0 4.4  CL 102 100  CO2 18* 14*  GLUCOSE 104* 94  BUN 18 16  CREATININE 0.87 0.86  CALCIUM 8.5 8.8     Liver Panel  No results for input(s): PROT, ALBUMIN, AST, ALT, ALKPHOS, BILITOT, BILIDIR, IBILI in the last 72 hours.     Sedimentation Rate No results for input(s): ESRSEDRATE in the last 72 hours. C-Reactive Protein No results for input(s): CRP in the last 72 hours.  Micro Results: Recent Results (from the past 720 hour(s))  Urine culture     Status: None   Collection  Time: 05/29/14  7:08 PM  Result Value Ref Range Status   Specimen Description URINE, CLEAN CATCH  Final   Special Requests NONE  Final   Culture  Setup Time   Final    05/30/2014 02:34 Performed at [place] Performed at [facility]   Final   Culture NO GROWTH Performed at [facility]   Final   Report Status 05/31/2014 FINAL  Final  Clostridium Difficile by PCR     Status: None   Collection Time: 06/05/14  5:31 PM  Result Value Ref Range Status   C difficile by pcr NEGATIVE NEGATIVE Final  Clostridium Difficile by PCR     Status: Abnormal   Collection Time: 06/13/14  9:04 PM  Result Value Ref Range Status   C difficile by pcr POSITIVE (A) NEGATIVE Final    Comment: PERFORMED AT [place], READ BACK BY AND VERIFIED WITH: Meade Maw AT 2536 06/14/14 BY [place] Performed at [facility]   Stool culture     Status: None   Collection Time: 06/13/14  9:04 PM  Result Value Ref Range Status   Specimen Description STOOL  Final   Special Requests Normal  Final   Culture   Final    NO SALMONELLA, SHIGELLA, CAMPYLOBACTER, YERSINIA, OR E.COLI 0157:H7 ISOLATED Performed at [facility]    Report Status 06/17/2014 FINAL  Final  Ova and parasite examination     Status: None   Collection Time: 06/14/14  6:54 AM  Result Value Ref Range Status   Specimen Description STOOL  Final   Special Requests NONE  Final   Ova and parasites   Final    NO OVA OR PARASITES SEEN Performed at [facility]    Report Status 06/16/2014 FINAL  Final  Clostridium Difficile by PCR     Status: Abnormal   Collection Time: 06/14/14 11:28 AM  Result Value Ref Range Status   C difficile by pcr POSITIVE (A) NEGATIVE Final    Comment: CRITICAL RESULT CALLED TO, READ BACK BY AND VERIFIED WITH: DUMAS,N RN @ 1406 06/14/14 LEONARD,A Performed at [facility]   Clostridium Difficile by PCR      Status: None   Collection Time: 06/25/14 12:04 PM  Result Value Ref Range Status   C difficile by pcr NEGATIVE NEGATIVE Final    Comment: Performed at [facility]    Studies/Results: No results found.    Assessment/Plan:  Active Problems:   Seizure disorder   COPD (chronic obstructive pulmonary disease)   Alcohol abuse   Vitamin D deficiency   Macular degeneration   Distal radius fracture, right   Intertrochanteric fracture of right hip   C. difficile colitis   Metabolic acidosis   Protein-calorie malnutrition, severe   Poor dentition   Diarrhea   Hyperkalemia   Screening for STD (sexually transmitted disease)   Dehydration   Weakness    Joshua Reed is a 69 y.o. male with difficult to treat CDI  #1 Clostridium difficile colitis:  --continue DIFICID and would send him home with 10 ADDITIONAL days IF and when he becomes well enough to go home  GI has recommended adding  imodium to bentyl in case part of this is a post CDI IBS  I would not make much of the negative CDiff PCRs done on antibiotics  I would watch him closely to make sure he does not develop significantly worsening  Abdominal pain on imodium as concern with CDI and imodium is risk for toxic megacolon  Per the notes he has had some improvement in #s of bowel movement since I saw him so I have not started IV flagyl or rectal vancomycin     AVOID PPI, H2Blockers  #2 Screening: HIV and Hep panel negative  Dr. Baxter Flattery is back tomorrow     LOS: 12 days   Alcide Evener 06/25/2014, 7:00 PM

## 2014-06-25 NOTE — Plan of Care (Signed)
Problem: Phase II Progression Outcomes Goal: Obtain order to discontinue catheter if appropriate Outcome: Not Applicable Date Met:  06/25/14     

## 2014-06-26 LAB — COMPREHENSIVE METABOLIC PANEL
ALT: 34 U/L (ref 0–53)
AST: 48 U/L — AB (ref 0–37)
Albumin: 2.8 g/dL — ABNORMAL LOW (ref 3.5–5.2)
Alkaline Phosphatase: 168 U/L — ABNORMAL HIGH (ref 39–117)
Anion gap: 12 (ref 5–15)
BUN: 14 mg/dL (ref 6–23)
CALCIUM: 8.4 mg/dL (ref 8.4–10.5)
CO2: 12 mEq/L — ABNORMAL LOW (ref 19–32)
Chloride: 104 mEq/L (ref 96–112)
Creatinine, Ser: 0.81 mg/dL (ref 0.50–1.35)
GFR calc Af Amer: >90 mL/min (ref >90)
GFR calc non Af Amer: 90 mL/min — ABNORMAL LOW (ref >90)
Glucose, Bld: 107 mg/dL — ABNORMAL HIGH (ref 70–99)
Potassium: 4.8 mEq/L (ref 3.7–5.3)
SODIUM: 128 meq/L — AB (ref 137–147)
TOTAL PROTEIN: 6.3 g/dL (ref 6.0–8.3)
Total Bilirubin: 0.9 mg/dL (ref 0.3–1.2)

## 2014-06-26 LAB — CBC
HCT: 40.8 % (ref 39.0–52.0)
Hemoglobin: 13 g/dL (ref 13.0–17.0)
MCH: 30 pg (ref 26.0–34.0)
MCHC: 31.9 g/dL (ref 30.0–36.0)
MCV: 94 fL (ref 78.0–100.0)
Platelets: 256 10*3/uL (ref 150–400)
RBC: 4.34 MIL/uL (ref 4.22–5.81)
RDW: 18 % — AB (ref 11.5–15.5)
WBC: 10.3 10*3/uL (ref 4.0–10.5)

## 2014-06-26 MED ORDER — SODIUM BICARBONATE 650 MG PO TABS
650.0000 mg | ORAL_TABLET | Freq: Three times a day (TID) | ORAL | Status: DC
  Administered 2014-06-26 – 2014-07-03 (×20): 650 mg via ORAL
  Filled 2014-06-26 (×22): qty 1

## 2014-06-26 NOTE — Progress Notes (Signed)
Patient ID: Joshua Reed, male   DOB: Dec 06, 1945, 68 y.o.   MRN: [id number]  [facility] PROGRESS NOTE  Joshua Reed [id number] DOB: 09-Aug-1945 DOA: 06/13/2014 PCP: Gennette Pac, MD Brief narrative:    68 y.o. year old male with COPD, ETOH abuse, seizure disorder, recent R distal radius and right hip fracture s/p fall presented with persistent watery diarrhea since discharge. Patient noted to have been recently discharged from short-term rehabilitation (11/09 - 11/17). Last colonoscopy w/ [facility] last year that was normal per pt.   Patient's hospital course is complicated with ongoing perfuse diarrhea. Patient is currently on fidoxamicin started no 06/20/2014.  Assessment/Plan:   Principal Problem: Severe C diff Diarrhea / leukocytosis - initially treated with oral vancomycin and IV Flagyl, both ABX started 11/16 and now d/c per ID rec's - started on fidoxamicin 12/01 - pt still with high stool output but slightly improved over the past 48 houtd - continue IV fluids, NS @ 75 cc/hr  - appreciate GI team recommendations  Active Problems: Anion gap metabolic acidosis - likely secondary to above - Initially resolved but acidosis evident again 06/22/2014 with bicarbonate of 17 - continue sodium bicarbonate for now  - Follow-up BMP in morning Hyponatremia - most likely secondary to the GI losses, diarrhea Hypokalemia / hyperkalemia - Potassium initially supplemented and with resulting hyperkalemia. Potassium was 5.4 (12/03) - K is WNL this AM ETOH use - currently denies - CIWA  COPD  - stable  - no resp distress/cough/wheezing Right distal radius/R hip fx  - 6 x 4 cm postoperative right gluteal hematoma. - Not a concern in the setting of stable hemoglobin Protein calorie malnutrition, severe - Nutrition consultation obtained  DVT prophylaxis: - Heparin SQ   Code Status: Full.  Family Communication: plan of care discussed with the  patient and his wife at the bedside Disposition Plan: Home when stable.   IV access:   Peripheral IV Procedures and diagnostic studies:    Dg Abd 2 Views 06/22/2014 Nonobstructive bowel gas pattern. Medical Consultants:   Infectious disease (Dr. Carlyle Basques)   GI IAnti-Infectives:    Flagyl IV 11/26 --> 12/01  Vancomycin PO 11/26 --> 12/01  Fidaxomicin 12/01 -->  Faye Ramsay, MD  [facility] Pager [phone]  If 7PM-7AM, please contact night-coverage [email or web address] Password [email or web address] 06/26/2014, 11:38 AM   LOS: 13 days   HPI/Subjective: No events overnight. Persistent diarrhea   Objective: Filed Vitals:   06/25/14 0515 06/25/14 1300 06/25/14 2113 06/26/14 0517  BP: 99/66 120/65 121/68 97/56  Pulse: 76 73 61 61  Temp: 97.5 F (36.4 C) 97.6 F (36.4 C) 97.4 F (36.3 C) 97.4 F (36.3 C)  TempSrc: Oral Oral Oral Oral  Resp: 18  18 16   Height:      Weight:      SpO2: 99% 100% 100% 97%    Intake/Output Summary (Last 24 hours) at 06/26/14 1138 Last data filed at 06/25/14 1700  Gross per 24 hour  Intake    480 ml  Output      0 ml  Net    480 ml    Exam:   General:  Pt is alert, follows commands appropriately, not in acute distress  Cardiovascular: Regular rate and rhythm, S1/S2, no murmurs, no rubs, no gallops  Respiratory: Clear to auscultation bilaterally, no wheezing, no crackles, no rhonchi  Abdomen: Soft, non tender, non distended, bowel sounds present, no guarding  Data Reviewed: Basic Metabolic Panel:  Recent Labs Lab 06/22/14  0051 06/23/14 1115 06/24/14 1206 06/25/14 0540 06/26/14 0542  NA 130* 131* 131* 129* 128*  K 5.4* 3.9 4.0 4.4 4.8  CL 95* 99 102 100 104  CO2 17* 18* 18* 14* 12*  GLUCOSE 107* 117* 104* 94 107*  BUN 19 15 18 16 14   CREATININE 0.95 0.83 0.87 0.86 0.81  CALCIUM 8.5 8.3* 8.5 8.8 8.4   Liver Function Tests:  Recent Labs Lab 06/26/14 0542  AST 48*  ALT 34  ALKPHOS 168*  BILITOT 0.9  PROT  6.3  ALBUMIN 2.8*   CBC:  Recent Labs Lab 06/22/14 0435 06/24/14 0520 06/25/14 0540 06/26/14 0542  WBC 10.7* 10.3 10.2 10.3  HGB 14.2 12.8* 14.1 13.0  HCT 44.1 39.9 42.5 40.8  MCV 93.2 94.3 94.0 94.0  PLT 163 127* 289 256    Recent Results (from the past 240 hour(s))  Clostridium Difficile by PCR     Status: None   Collection Time: 06/25/14 12:04 PM  Result Value Ref Range Status   C difficile by pcr NEGATIVE NEGATIVE Final    Comment: Performed at [facility]     Scheduled Meds: . aspirin  325 mg Oral BID  . cholecalciferol  1,000 Units Oral Daily  . cholestyramine  4 g Oral 3 times per day  . dicyclomine  20 mg Oral QID  . fidaxomicin  200 mg Oral BID  . heparin  5,000 Units Subcutaneous 3 times per day  . nicotine  21 mg Transdermal Daily  . OxyCODONE  15 mg Oral Q12H  . phenytoin  200 mg Oral BID  . protein supplement  1 scoop Oral BID WC  . saccharomyces boulardii  250 mg Oral BID  . sodium bicarbonate  650 mg Oral Daily   Continuous Infusions: . sodium chloride 50 mL/hr at 06/22/14 [phone]

## 2014-06-26 NOTE — Progress Notes (Signed)
Patient ID: Joshua Reed, male   DOB: [birth year], 68 y.o.   MRN: [id number] [facility] Gastroenterology Progress Note  Joshua Reed 68 y.o. Oct 15, 1945   Subjective: Continues to have profuse watery stools. Wife reports over 20 large volume watery stools in past 24 hours.  Objective: Vital signs in last 24 hours: Filed Vitals:   06/26/14 0517  BP: 97/56  Pulse: 61  Temp: 97.4 F (36.3 C)  Resp: 16    Physical Exam: Gen: thin, lethargic, no acute distress, poor dentition Abd: soft, nontender, nondistended, +BS Ext: cast on right arm  Lab Results:  Recent Labs  06/25/14 0540 06/26/14 0542  NA 129* 128*  K 4.4 4.8  CL 100 104  CO2 14* 12*  GLUCOSE 94 107*  BUN 16 14  CREATININE 0.86 0.81  CALCIUM 8.8 8.4    Recent Labs  06/26/14 0542  AST 48*  ALT 34  ALKPHOS 168*  BILITOT 0.9  PROT 6.3  ALBUMIN 2.8*    Recent Labs  06/25/14 0540 06/26/14 0542  WBC 10.2 10.3  HGB 14.1 13.0  HCT 42.5 40.8  MCV 94.0 94.0  PLT 289 256   No results for input(s): LABPROT, INR in the last 72 hours.    Assessment/Plan: C. Diff infection - Agree with ID recs. Continue Dificid. Will stop Imodium. If profuse water stools continues over the next 1-2 days then may need a flex sig to further evaluate diarrhea. Continue supportive care.   [place] C. 06/26/2014, 10:40 AM

## 2014-06-27 DIAGNOSIS — F10120 Alcohol abuse with intoxication, uncomplicated: Secondary | ICD-10-CM

## 2014-06-27 DIAGNOSIS — J441 Chronic obstructive pulmonary disease with (acute) exacerbation: Secondary | ICD-10-CM

## 2014-06-27 DIAGNOSIS — H3531 Nonexudative age-related macular degeneration: Secondary | ICD-10-CM

## 2014-06-27 DIAGNOSIS — R2689 Other abnormalities of gait and mobility: Secondary | ICD-10-CM

## 2014-06-27 DIAGNOSIS — S8290XD Unspecified fracture of unspecified lower leg, subsequent encounter for closed fracture with routine healing: Secondary | ICD-10-CM

## 2014-06-27 LAB — BASIC METABOLIC PANEL
Anion gap: 14 (ref 5–15)
BUN: 10 mg/dL (ref 6–23)
CO2: 12 mEq/L — ABNORMAL LOW (ref 19–32)
Calcium: 8.1 mg/dL — ABNORMAL LOW (ref 8.4–10.5)
Chloride: 106 mEq/L (ref 96–112)
Creatinine, Ser: 0.75 mg/dL (ref 0.50–1.35)
GFR calc Af Amer: >90 mL/min (ref >90)
GFR calc non Af Amer: >90 mL/min (ref >90)
GLUCOSE: 97 mg/dL (ref 70–99)
POTASSIUM: 4 meq/L (ref 3.7–5.3)
Sodium: 132 mEq/L — ABNORMAL LOW (ref 137–147)

## 2014-06-27 LAB — CBC
HCT: 36.9 % — ABNORMAL LOW (ref 39.0–52.0)
HEMOGLOBIN: 11.8 g/dL — AB (ref 13.0–17.0)
MCH: 29.8 pg (ref 26.0–34.0)
MCHC: 32 g/dL (ref 30.0–36.0)
MCV: 93.2 fL (ref 78.0–100.0)
Platelets: 263 10*3/uL (ref 150–400)
RBC: 3.96 MIL/uL — ABNORMAL LOW (ref 4.22–5.81)
RDW: 18.1 % — ABNORMAL HIGH (ref 11.5–15.5)
WBC: 6.5 10*3/uL (ref 4.0–10.5)

## 2014-06-27 MED ORDER — GI COCKTAIL ~~LOC~~
30.0000 mL | Freq: Three times a day (TID) | ORAL | Status: DC | PRN
  Filled 2014-06-27: qty 30

## 2014-06-27 NOTE — Progress Notes (Signed)
Patient ID: Joshua Reed, male   DOB: [birth year], 68 y.o.   MRN: [id number]  [facility] PROGRESS NOTE  SHAILEN THIELEN [id number] DOB: 1946-01-03 DOA: 06/13/2014 PCP: Gennette Pac, MD  Brief narrative:    68 y.o. year old male with COPD, ETOH abuse, seizure disorder, recent R distal radius and right hip fracture s/p fall presented with persistent watery diarrhea since discharge. Patient noted to have been recently discharged from short-term rehabilitation (11/09 - 11/17). Last colonoscopy w/ [facility] last year that was normal per pt.   Patient's hospital course is complicated with ongoing perfuse diarrhea. Patient is currently on fidoxamicin started no 06/20/2014.  Assessment/Plan:   Principal Problem: Severe C diff Diarrhea / leukocytosis - initially treated with oral vancomycin and IV Flagyl, both ABX started 11/16 and now d/c per ID rec's - started on fidoxamicin 12/01 - pt still with high stool output but slightly improved over the past 48 houtd - continue IV fluids, NS @ 75 cc/hr  - appreciate GI team recommendations, plan for flex sig in AM Active Problems: Anion gap metabolic acidosis - likely secondary to above - Initially resolved but acidosis evident again 06/22/2014 with bicarbonate of 17 and now worsening, CO2 12 - continue sodium bicarbonate for now  - Follow-up BMP in morning Hyponatremia - most likely secondary to the GI losses, diarrhea - improving  Hypokalemia / hyperkalemia - Potassium initially supplemented and with resulting hyperkalemia. Potassium was 5.4 (12/03) - K is WNL this AM ETOH use - currently denies - CIWA  COPD  - stable  - no resp distress/cough/wheezing Right distal radius/R hip fx  - 6 x 4 cm postoperative right gluteal hematoma. - Not a concern in the setting of stable hemoglobin Protein calorie malnutrition, severe - Nutrition consultation obtained  DVT prophylaxis: - Heparin SQ   Code Status:  Full.  Family Communication: plan of care discussed with the patient and his wife at the bedside Disposition Plan: Home when stable.   IV access:   Peripheral IV Procedures and diagnostic studies:    Dg Abd 2 Views 06/22/2014 Nonobstructive bowel gas pattern. Medical Consultants:   Infectious disease (Dr. Carlyle Basques)   GI IAnti-Infectives:    Flagyl IV 11/26 --> 12/01  Vancomycin PO 11/26 --> 12/01  Fidaxomicin 12/01 -->  Faye Ramsay, MD  [facility] Pager [phone]  If 7PM-7AM, please contact night-coverage [email or web address] Password [email or web address] 06/27/2014, 9:05 AM   LOS: 14 days   HPI/Subjective: No events overnight.   Objective: Filed Vitals:   06/26/14 0517 06/26/14 1406 06/26/14 2100 06/27/14 0528  BP: 97/56 115/65 109/68 119/65  Pulse: 61 65 64 60  Temp: 97.4 F (36.3 C) 97.5 F (36.4 C) 97.7 F (36.5 C) 97.5 F (36.4 C)  TempSrc: Oral Oral Oral Oral  Resp: 16 18 16 18   Height:      Weight:    49.85 kg (109 lb 14.4 oz)  SpO2: 97% 100% 100% 100%   No intake or output data in the 24 hours ending 06/27/14 0905  Exam:   General:  Pt is alert, follows commands appropriately, not in acute distress  Cardiovascular: Regular rate and rhythm, S1/S2, no murmurs, no rubs, no gallops  Respiratory: Clear to auscultation bilaterally, no wheezing, no crackles, no rhonchi  Abdomen: Soft, non tender, non distended, bowel sounds present, no guarding  Data Reviewed: Basic Metabolic Panel:  Recent Labs Lab 06/23/14 1115 06/24/14 1206 06/25/14 0540 06/26/14 0542 06/27/14 0425  NA 131* 131* 129* 128* 132*  K 3.9 4.0 4.4 4.8 4.0  CL 99 102 100 104 106  CO2 18* 18* 14* 12* 12*  GLUCOSE 117* 104* 94 107* 97  BUN 15 18 16 14 10   CREATININE 0.83 0.87 0.86 0.81 0.75  CALCIUM 8.3* 8.5 8.8 8.4 8.1*   Liver Function Tests:  Recent Labs Lab 06/26/14 0542  AST 48*  ALT 34  ALKPHOS 168*  BILITOT 0.9  PROT 6.3  ALBUMIN 2.8*    CBC:  Recent Labs Lab 06/22/14 0435 06/24/14 0520 06/25/14 0540 06/26/14 0542 06/27/14 0425  WBC 10.7* 10.3 10.2 10.3 6.5  HGB 14.2 12.8* 14.1 13.0 11.8*  HCT 44.1 39.9 42.5 40.8 36.9*  MCV 93.2 94.3 94.0 94.0 93.2  PLT 163 127* 289 256 263     Recent Results (from the past 240 hour(s))  Clostridium Difficile by PCR     Status: None   Collection Time: 06/25/14 12:04 PM  Result Value Ref Range Status   C difficile by pcr NEGATIVE NEGATIVE Final    Comment: Performed at [facility]     Scheduled Meds: . aspirin  325 mg Oral BID  . cholecalciferol  1,000 Units Oral Daily  . cholestyramine  4 g Oral 3 times per day  . dicyclomine  20 mg Oral QID  . fidaxomicin  200 mg Oral BID  . heparin  5,000 Units Subcutaneous 3 times per day  . nicotine  21 mg Transdermal Daily  . OxyCODONE  15 mg Oral Q12H  . phenytoin  200 mg Oral BID  . protein supplement  1 scoop Oral BID WC  . saccharomyces boulardii  250 mg Oral BID  . sodium bicarbonate  650 mg Oral TID   Continuous Infusions: . sodium chloride 75 mL/hr (06/27/14 0836)

## 2014-06-27 NOTE — Progress Notes (Signed)
[place] for Infectious Disease          Day # 8  dificid  Subjective:   Still having frequent bm, soiled bedding last night x 2. With abdominal pain  Antibiotics:  Anti-infectives    Start     Dose/Rate Route Frequency Ordered Stop   06/23/14 0000  fidaxomicin (DIFICID) 200 MG TABS tablet     200 mg Oral 2 times daily 06/23/14 1010     06/21/14 2200  fidaxomicin (DIFICID) tablet 200 mg     200 mg Oral 2 times daily 06/21/14 1042     06/21/14 1200  fidaxomicin (DIFICID) tablet 200 mg  Status:  Discontinued     200 mg Oral 2 times daily 06/21/14 1037 06/21/14 1042   06/20/14 2200  fidaxomicin (DIFICID) tablet 200 mg  Status:  Discontinued     200 mg Oral 2 times daily 06/20/14 1704 06/21/14 0935   06/19/14 1000  metroNIDAZOLE (FLAGYL) IVPB 500 mg  Status:  Discontinued     500 mg100 mL/hr over 60 Minutes Intravenous Every 8 hours 06/19/14 0938 06/20/14 1704   06/15/14 1330  vancomycin (VANCOCIN) 50 mg/mL oral solution 500 mg  Status:  Discontinued     500 mg Oral 4 times per day 06/15/14 1324 06/20/14 1704   06/15/14 1330  metroNIDAZOLE (FLAGYL) IVPB 500 mg  Status:  Discontinued     500 mg100 mL/hr over 60 Minutes Intravenous Every 8 hours 06/15/14 1324 06/18/14 1053   06/14/14 1400  metroNIDAZOLE (FLAGYL) tablet 500 mg  Status:  Discontinued     500 mg Oral 3 times per day 06/14/14 0954 06/15/14 1324   06/14/14 0330  ciprofloxacin (CIPRO) IVPB 400 mg  Status:  Discontinued     400 mg200 mL/hr over 60 Minutes Intravenous Every 12 hours 06/14/14 0317 06/14/14 0954   06/14/14 0330  metroNIDAZOLE (FLAGYL) IVPB 500 mg  Status:  Discontinued     500 mg100 mL/hr over 60 Minutes Intravenous Every 8 hours 06/14/14 0317 06/14/14 0954      Medications: Scheduled Meds: . aspirin  325 mg Oral BID  . cholecalciferol  1,000 Units Oral Daily  . cholestyramine  4 g Oral 3 times per day  . dicyclomine  20 mg Oral QID  . fidaxomicin  200 mg Oral BID  . heparin  5,000 Units  Subcutaneous 3 times per day  . nicotine  21 mg Transdermal Daily  . OxyCODONE  15 mg Oral Q12H  . phenytoin  200 mg Oral BID  . protein supplement  1 scoop Oral BID WC  . saccharomyces boulardii  250 mg Oral BID  . sodium bicarbonate  650 mg Oral TID     Objective: Weight change:   Intake/Output Summary (Last 24 hours) at 06/27/14 1657 Last data filed at 06/27/14 1411  Gross per 24 hour  Intake    520 ml  Output      0 ml  Net    520 ml   Blood pressure 103/57, pulse 65, temperature 97.8 F (36.6 C), temperature source Oral, resp. rate 18, height 5\' 9"  (1.753 m), weight 109 lb 14.4 oz (49.85 kg), SpO2 98 %. Temp:  [97.5 F (36.4 C)-97.8 F (36.6 C)] 97.8 F (36.6 C) (12/08 1442) Pulse Rate:  [60-65] 65 (12/08 1442) Resp:  [16-18] 18 (12/08 1442) BP: (103-119)/(57-68) 103/57 mmHg (12/08 1442) SpO2:  [98 %-100 %] 98 % (12/08 1442) Weight:  [109 lb 14.4 oz (49.85 kg)] 109 lb  14.4 oz (49.85 kg) (12/08 0528)  Physical Exam: General: Alert and awake, oriented sitting in bed Abd= soft, scaphoid, hyperactive bs Ext = no c/c/e Skin = warm  CBC Latest Ref Rng 06/27/2014 06/26/2014 06/25/2014  WBC 4.0 - 10.5 K/uL 6.5 10.3 10.2  Hemoglobin 13.0 - 17.0 g/dL 11.8(L) 13.0 14.1  Hematocrit 39.0 - 52.0 % 36.9(L) 40.8 42.5  Platelets 150 - 400 K/uL 263 256 289      BMET  Recent Labs  06/26/14 0542 06/27/14 0425  NA 128* 132*  K 4.8 4.0  CL 104 106  CO2 12* 12*  GLUCOSE 107* 97  BUN 14 10  CREATININE 0.81 0.75  CALCIUM 8.4 8.1*     Liver Panel   Recent Labs  06/26/14 0542  PROT 6.3  ALBUMIN 2.8*  AST 48*  ALT 34  ALKPHOS 168*  BILITOT 0.9       Sedimentation Rate No results for input(s): ESRSEDRATE in the last 72 hours. C-Reactive Protein No results for input(s): CRP in the last 72 hours.  Micro Results: Recent Results (from the past 720 hour(s))  Urine culture     Status: None   Collection Time: 05/29/14  7:08 PM  Result Value Ref Range Status    Specimen Description URINE, CLEAN CATCH  Final   Special Requests NONE  Final   Culture  Setup Time   Final    05/30/2014 02:34 Performed at [place] Performed at [facility]   Final   Culture NO GROWTH Performed at [facility]   Final   Report Status 05/31/2014 FINAL  Final  Clostridium Difficile by PCR     Status: None   Collection Time: 06/05/14  5:31 PM  Result Value Ref Range Status   C difficile by pcr NEGATIVE NEGATIVE Final  Clostridium Difficile by PCR     Status: Abnormal   Collection Time: 06/13/14  9:04 PM  Result Value Ref Range Status   C difficile by pcr POSITIVE (A) NEGATIVE Final    Comment: PERFORMED AT [place], READ BACK BY AND VERIFIED WITH: [place] Maw AT 8099 06/14/14 BY [place] Performed at [facility]   Stool culture     Status: None   Collection Time: 06/13/14  9:04 PM  Result Value Ref Range Status   Specimen Description STOOL  Final   Special Requests Normal  Final   Culture   Final    NO SALMONELLA, SHIGELLA, CAMPYLOBACTER, YERSINIA, OR E.COLI 0157:H7 ISOLATED Performed at [facility]    Report Status 06/17/2014 FINAL  Final  Ova and parasite examination     Status: None   Collection Time: 06/14/14  6:54 AM  Result Value Ref Range Status   Specimen Description STOOL  Final   Special Requests NONE  Final   Ova and parasites   Final    NO OVA OR PARASITES SEEN Performed at [facility]    Report Status 06/16/2014 FINAL  Final  Clostridium Difficile by PCR     Status: Abnormal   Collection Time: 06/14/14 11:28 AM  Result Value Ref Range Status   C difficile by pcr POSITIVE (A) NEGATIVE Final    Comment: CRITICAL RESULT CALLED TO, READ BACK BY AND VERIFIED WITH: DUMAS,N RN @ 1406 06/14/14 LEONARD,A Performed at [facility]   Clostridium Difficile by PCR     Status: None   Collection Time:  06/25/14 12:04 PM  Result  Value Ref Range Status   C difficile by pcr NEGATIVE NEGATIVE Final    Comment: Performed at [facility]    Studies/Results: No results found.    Assessment/Plan:  Active Problems:   Seizure disorder   COPD (chronic obstructive pulmonary disease)   Alcohol abuse   Vitamin D deficiency   Macular degeneration   Distal radius fracture, right   Intertrochanteric fracture of right hip   C. difficile colitis   Metabolic acidosis   Protein-calorie malnutrition, severe   Poor dentition   Diarrhea   Hyperkalemia   Screening for STD (sexually transmitted disease)   Dehydration   Weakness   Generalized abdominal pain    Joshua Reed is a 68 y.o. male with difficult to treat CDI  #1 Clostridium difficile colitis:  --continue DIFICID and would send him home with 10 ADDITIONAL days IF and when he becomes well enough to go home  Agree with the plan that Dr. Michail Sermon planning to do flex sig tomorrow, to look for other causes of chronic diarrhea in addition to cdifficile  AVOID PPI, H2Blockers  #2 Screening: HIV and Hep panel negative     LOS: 14 days   Kayton Dunaj 06/27/2014, 4:57 PM

## 2014-06-27 NOTE — Progress Notes (Signed)
Patient ID: MOHAMUD MROZEK, male   DOB: 1945-12-20, 68 y.o.   MRN: [id number] [facility] Gastroenterology Progress Note  NAVI ERBER 68 y.o. [birth year]   Subjective: Profuse watery diarrhea persisting. Sitting up on side of bed. Poor appetite. Occasional abdominal pain. Wife at bedside and frustrated with his lack of improvement of the diarrhea.  Objective: Vital signs in last 24 hours: Filed Vitals:   06/27/14 0528  BP: 119/65  Pulse: 60  Temp: 97.5 F (36.4 C)  Resp: 18    Physical Exam: Gen: alert, no acute distress, thin, poor dentition  Lab Results:  Recent Labs  06/26/14 0542 06/27/14 0425  NA 128* 132*  K 4.8 4.0  CL 104 106  CO2 12* 12*  GLUCOSE 107* 97  BUN 14 10  CREATININE 0.81 0.75  CALCIUM 8.4 8.1*    Recent Labs  06/26/14 0542  AST 48*  ALT 34  ALKPHOS 168*  BILITOT 0.9  PROT 6.3  ALBUMIN 2.8*    Recent Labs  06/26/14 0542 06/27/14 0425  WBC 10.3 6.5  HGB 13.0 11.8*  HCT 40.8 36.9*  MCV 94.0 93.2  PLT 256 263   No results for input(s): LABPROT, INR in the last 72 hours.    Assessment/Plan: Chronic diarrhea - recent C. Diff on Dificid. Unprepped flex sigmoidoscopy tomorrow morning to evaluate for pseudomembranous colitis vs. Ischemic colitis. Clear liquid diet starting at 1700. NPO p MN.   [place] C. 06/27/2014, 9:23 AM

## 2014-06-28 ENCOUNTER — Encounter (HOSPITAL_COMMUNITY)
Admission: EM | Disposition: A | Discharge status: Home Health | Payer: Self-pay | Source: Home / Self Care | Attending: Internal Medicine

## 2014-06-28 ENCOUNTER — Encounter (HOSPITAL_COMMUNITY): Payer: Self-pay | Admitting: *Deleted

## 2014-06-28 DIAGNOSIS — Z1159 Encounter for screening for other viral diseases: Secondary | ICD-10-CM

## 2014-06-28 DIAGNOSIS — Z1389 Encounter for screening for other disorder: Secondary | ICD-10-CM

## 2014-06-28 HISTORY — PX: FLEXIBLE SIGMOIDOSCOPY: SHX5431

## 2014-06-28 LAB — BASIC METABOLIC PANEL
Anion gap: 11 (ref 5–15)
BUN: 7 mg/dL (ref 6–23)
CHLORIDE: 104 meq/L (ref 96–112)
CO2: 17 mEq/L — ABNORMAL LOW (ref 19–32)
CREATININE: 0.78 mg/dL (ref 0.50–1.35)
Calcium: 8.4 mg/dL (ref 8.4–10.5)
GFR calc non Af Amer: >90 mL/min (ref >90)
GLUCOSE: 100 mg/dL — AB (ref 70–99)
Potassium: 3.8 mEq/L (ref 3.7–5.3)
Sodium: 132 mEq/L — ABNORMAL LOW (ref 137–147)

## 2014-06-28 LAB — CBC
HCT: 38.4 % — ABNORMAL LOW (ref 39.0–52.0)
HEMOGLOBIN: 12.5 g/dL — AB (ref 13.0–17.0)
MCH: 30.5 pg (ref 26.0–34.0)
MCHC: 32.6 g/dL (ref 30.0–36.0)
MCV: 93.7 fL (ref 78.0–100.0)
Platelets: 291 10*3/uL (ref 150–400)
RBC: 4.1 MIL/uL — ABNORMAL LOW (ref 4.22–5.81)
RDW: 18.2 % — AB (ref 11.5–15.5)
WBC: 8.8 10*3/uL (ref 4.0–10.5)

## 2014-06-28 SURGERY — SIGMOIDOSCOPY, FLEXIBLE
Anesthesia: Moderate Sedation

## 2014-06-28 MED ORDER — MIDAZOLAM HCL 10 MG/2ML IJ SOLN
INTRAMUSCULAR | Status: AC
  Filled 2014-06-28: qty 2

## 2014-06-28 MED ORDER — LOPERAMIDE HCL 2 MG PO CAPS: 2.0000 mg | ORAL_CAPSULE | ORAL | Status: DC | PRN

## 2014-06-28 MED ORDER — MIDAZOLAM HCL 10 MG/2ML IJ SOLN
INTRAMUSCULAR | Status: DC | PRN
  Administered 2014-06-28: 2 mg via INTRAVENOUS

## 2014-06-28 MED ORDER — FENTANYL CITRATE 0.05 MG/ML IJ SOLN
INTRAMUSCULAR | Status: DC | PRN
  Administered 2014-06-28: 25 ug via INTRAVENOUS

## 2014-06-28 MED ORDER — FENTANYL CITRATE 0.05 MG/ML IJ SOLN
INTRAMUSCULAR | Status: AC
  Filled 2014-06-28: qty 2

## 2014-06-28 NOTE — H&P (View-Only) (Signed)
Patient ID: Joshua Reed, male   DOB: 16-Nov-1945, 68 y.o.   MRN: [id number] [facility] Gastroenterology Progress Note  DELMOS VELAQUEZ 68 y.o. 04/08/1946   Subjective: Profuse watery diarrhea persisting. Sitting up on side of bed. Poor appetite. Occasional abdominal pain. Wife at bedside and frustrated with his lack of improvement of the diarrhea.  Objective: Vital signs in last 24 hours: Filed Vitals:   06/27/14 0528  BP: 119/65  Pulse: 60  Temp: 97.5 F (36.4 C)  Resp: 18    Physical Exam: Gen: alert, no acute distress, thin, poor dentition  Lab Results:  Recent Labs  06/26/14 0542 06/27/14 0425  NA 128* 132*  K 4.8 4.0  CL 104 106  CO2 12* 12*  GLUCOSE 107* 97  BUN 14 10  CREATININE 0.81 0.75  CALCIUM 8.4 8.1*    Recent Labs  06/26/14 0542  AST 48*  ALT 34  ALKPHOS 168*  BILITOT 0.9  PROT 6.3  ALBUMIN 2.8*    Recent Labs  06/26/14 0542 06/27/14 0425  WBC 10.3 6.5  HGB 13.0 11.8*  HCT 40.8 36.9*  MCV 94.0 93.2  PLT 256 263   No results for input(s): LABPROT, INR in the last 72 hours.    Assessment/Plan: Chronic diarrhea - recent C. Diff on Dificid. Unprepped flex sigmoidoscopy tomorrow morning to evaluate for pseudomembranous colitis vs. Ischemic colitis. Clear liquid diet starting at 1700. NPO p MN.   Luis M. [place]int[place] C. 06/27/2014, 9:23 AM

## 2014-06-28 NOTE — Progress Notes (Signed)
[place] for Infectious Disease          Day # 9  dificid  Subjective:   Underwent flex sig today which was normal. Sleepy from procedure  Antibiotics:  Anti-infectives    Start     Dose/Rate Route Frequency Ordered Stop   06/23/14 0000  fidaxomicin (DIFICID) 200 MG TABS tablet     200 mg Oral 2 times daily 06/23/14 1010     06/21/14 2200  fidaxomicin (DIFICID) tablet 200 mg     200 mg Oral 2 times daily 06/21/14 1042     06/21/14 1200  fidaxomicin (DIFICID) tablet 200 mg  Status:  Discontinued     200 mg Oral 2 times daily 06/21/14 1037 06/21/14 1042   06/20/14 2200  fidaxomicin (DIFICID) tablet 200 mg  Status:  Discontinued     200 mg Oral 2 times daily 06/20/14 1704 06/21/14 0935   06/19/14 1000  metroNIDAZOLE (FLAGYL) IVPB 500 mg  Status:  Discontinued     500 mg100 mL/hr over 60 Minutes Intravenous Every 8 hours 06/19/14 0938 06/20/14 1704   06/15/14 1330  vancomycin (VANCOCIN) 50 mg/mL oral solution 500 mg  Status:  Discontinued     500 mg Oral 4 times per day 06/15/14 1324 06/20/14 1704   06/15/14 1330  metroNIDAZOLE (FLAGYL) IVPB 500 mg  Status:  Discontinued     500 mg100 mL/hr over 60 Minutes Intravenous Every 8 hours 06/15/14 1324 06/18/14 1053   06/14/14 1400  metroNIDAZOLE (FLAGYL) tablet 500 mg  Status:  Discontinued     500 mg Oral 3 times per day 06/14/14 0954 06/15/14 1324   06/14/14 0330  ciprofloxacin (CIPRO) IVPB 400 mg  Status:  Discontinued     400 mg200 mL/hr over 60 Minutes Intravenous Every 12 hours 06/14/14 0317 06/14/14 0954   06/14/14 0330  metroNIDAZOLE (FLAGYL) IVPB 500 mg  Status:  Discontinued     500 mg100 mL/hr over 60 Minutes Intravenous Every 8 hours 06/14/14 0317 06/14/14 0954      Medications: Scheduled Meds: . aspirin  325 mg Oral BID  . cholecalciferol  1,000 Units Oral Daily  . cholestyramine  4 g Oral 3 times per day  . dicyclomine  20 mg Oral QID  . fidaxomicin  200 mg Oral BID  . heparin  5,000 Units Subcutaneous  3 times per day  . nicotine  21 mg Transdermal Daily  . OxyCODONE  15 mg Oral Q12H  . phenytoin  200 mg Oral BID  . protein supplement  1 scoop Oral BID WC  . saccharomyces boulardii  250 mg Oral BID  . sodium bicarbonate  650 mg Oral TID     Objective: Weight change:   Intake/Output Summary (Last 24 hours) at 06/28/14 1416 Last data filed at 06/28/14 0900  Gross per 24 hour  Intake  712.5 ml  Output      0 ml  Net  712.5 ml   Blood pressure 100/52, pulse 57, temperature 97.4 F (36.3 C), temperature source Oral, resp. rate 12, height 5\' 9"  (1.753 m), weight 109 lb 14.4 oz (49.85 kg), SpO2 95 %. Temp:  [97.4 F (36.3 C)-98.8 F (37.1 C)] 97.4 F (36.3 C) (12/09 1023) Pulse Rate:  [57-71] 57 (12/09 1200) Resp:  [12-18] 12 (12/09 1200) BP: (87-140)/(39-65) 100/52 mmHg (12/09 1200) SpO2:  [95 %-100 %] 95 % (12/09 1200)  Physical Exam: General: sleeping in bed in no distress Abd= soft, scaphoid, hyperactive bs Ext =  no c/c/e Skin = warm  CBC Latest Ref Rng 06/28/2014 06/27/2014 06/26/2014  WBC 4.0 - 10.5 K/uL 8.8 6.5 10.3  Hemoglobin 13.0 - 17.0 g/dL 12.5(L) 11.8(L) 13.0  Hematocrit 39.0 - 52.0 % 38.4(L) 36.9(L) 40.8  Platelets 150 - 400 K/uL 291 263 256      BMET  Recent Labs  06/27/14 0425 06/28/14 0530  NA 132* 132*  K 4.0 3.8  CL 106 104  CO2 12* 17*  GLUCOSE 97 100*  BUN 10 7  CREATININE 0.75 0.78  CALCIUM 8.1* 8.4     Liver Panel   Recent Labs  06/26/14 0542  PROT 6.3  ALBUMIN 2.8*  AST 48*  ALT 34  ALKPHOS 168*  BILITOT 0.9    Micro Results: Recent Results (from the past 720 hour(s))  Urine culture     Status: None   Collection Time: 05/29/14  7:08 PM  Result Value Ref Range Status   Specimen Description URINE, CLEAN CATCH  Final   Special Requests NONE  Final   Culture  Setup Time   Final    05/30/2014 02:34 Performed at [place] Performed at [facility]   Final   Culture NO  GROWTH Performed at [facility]   Final   Report Status 05/31/2014 FINAL  Final  Clostridium Difficile by PCR     Status: None   Collection Time: 06/05/14  5:31 PM  Result Value Ref Range Status   C difficile by pcr NEGATIVE NEGATIVE Final  Clostridium Difficile by PCR     Status: Abnormal   Collection Time: 06/13/14  9:04 PM  Result Value Ref Range Status   C difficile by pcr POSITIVE (A) NEGATIVE Final    Comment: PERFORMED AT [place], READ BACK BY AND VERIFIED WITH: [place] Maw AT 0623 06/14/14 BY [place] Performed at [facility]   Stool culture     Status: None   Collection Time: 06/13/14  9:04 PM  Result Value Ref Range Status   Specimen Description STOOL  Final   Special Requests Normal  Final   Culture   Final    NO SALMONELLA, SHIGELLA, CAMPYLOBACTER, YERSINIA, OR E.COLI 0157:H7 ISOLATED Performed at [facility]    Report Status 06/17/2014 FINAL  Final  Ova and parasite examination     Status: None   Collection Time: 06/14/14  6:54 AM  Result Value Ref Range Status   Specimen Description STOOL  Final   Special Requests NONE  Final   Ova and parasites   Final    NO OVA OR PARASITES SEEN Performed at [facility]    Report Status 06/16/2014 FINAL  Final  Clostridium Difficile by PCR     Status: Abnormal   Collection Time: 06/14/14 11:28 AM  Result Value Ref Range Status   C difficile by pcr POSITIVE (A) NEGATIVE Final    Comment: CRITICAL RESULT CALLED TO, READ BACK BY AND VERIFIED WITH: DUMAS,N RN @ 1406 06/14/14 LEONARD,A Performed at [facility]   Clostridium Difficile by PCR     Status: None   Collection Time: 06/25/14 12:04 PM  Result Value Ref Range Status   C difficile by pcr NEGATIVE NEGATIVE Final    Comment: Performed at [facility]    Studies/Results: No results found.   Assessment/Plan:  Active Problems:   Seizure disorder   COPD (chronic obstructive  pulmonary disease)  Alcohol abuse   Vitamin D deficiency   Macular degeneration   Distal radius fracture, right   Intertrochanteric fracture of right hip   C. difficile colitis   Metabolic acidosis   Protein-calorie malnutrition, severe   Poor dentition   Diarrhea   Hyperkalemia   Screening for STD (sexually transmitted disease)   Dehydration   Weakness   Generalized abdominal pain    Joshua Reed is a 68 y.o. male with difficult to treat CDI  #1 Clostridium difficile colitis:  --continue DIFICID and finish out 14 days course of therapy -AVOID PPI, H2Blockers  #2 chronic diarrhea, possibly due to post infectious irritable bowel disease. Since repeat cdiff is negative. Having perfuse diarrhea  - will discuss with Dr. Michail Sermon if any further testing is needed. Otherwise, may need to optimize his current regimen with antispasmotics, decreased fiber in diet, colestipol or questran. And possible use of lomotil. If ok with GI, would start lomotil tomorrow. Change questran to colestipol  #3 health maintenance Screening: HIV and Hep panel negative     LOS: 15 days   Joshua Reed 06/28/2014, 2:16 PM

## 2014-06-28 NOTE — Interval H&P Note (Signed)
History and Physical Interval Note:  06/28/2014 11:27 AM  Joshua Reed  has presented today for surgery, with the diagnosis of diarrhea, hx of c diff  The various methods of treatment have been discussed with the patient and family. After consideration of risks, benefits and other options for treatment, the patient has consented to  Procedure(s): FLEXIBLE SIGMOIDOSCOPY (N/A) as a surgical intervention .  The patient's history has been reviewed, patient examined, no change in status, stable for surgery.  I have reviewed the patient's chart and labs.  Questions were answered to the patient's satisfaction.     [place] C.

## 2014-06-28 NOTE — Progress Notes (Signed)
Patient ID: LEVIE WAGES, male   DOB: [birth year], 68 y.o.   MRN: [id number]  [facility] PROGRESS NOTE  ALEPH NICKSON KWI:[id number] DOB: Dec 12, 1945 DOA: 06/13/2014 PCP: Gennette Pac, MD  Brief narrative:    68 y.o. year old male with COPD, ETOH abuse, seizure disorder, recent R distal radius and right hip fracture s/p fall presented with persistent watery diarrhea since discharge. Patient noted to have been recently discharged from short-term rehabilitation (11/09 - 11/17). Last colonoscopy w/ [facility] last year that was normal per pt.   Patient's hospital course is complicated with ongoing perfuse diarrhea. Patient is currently on fidoxamicin started no 06/20/2014.  Assessment/Plan:   Principal Problem: Severe C diff Diarrhea / leukocytosis - initially treated with oral vancomycin and IV Flagyl, both ABX started 11/16 and now d/c per ID rec's - started on fidoxamicin 12/01 - pt still with high stool output but slightly improved overall - continue IV fluids, NS @ 75 cc/hr  - appreciate GI team recommendations, flex sig positive for hemorrhoids but otherwise unremarkable, bx sent  Active Problems: Anion gap metabolic acidosis - likely secondary to above - Initially resolved but acidosis evident again 06/22/2014  - improving with IVF and bicarb  - continue sodium bicarbonate for now  - Follow-up BMP in morning Hyponatremia - most likely secondary to the GI losses, diarrhea - improving  Hypokalemia / hyperkalemia - Potassium initially supplemented and with resulting hyperkalemia. Potassium was 5.4 (12/03) - K is WNL this AM ETOH use - currently denies - CIWA  COPD  - stable  - no resp distress/cough/wheezing Right distal radius/R hip fx  - 6 x 4 cm postoperative right gluteal hematoma. - Not a concern in the setting of stable hemoglobin Protein calorie malnutrition, severe - Nutrition consultation obtained  DVT prophylaxis: - Heparin  SQ   Code Status: Full.  Family Communication: plan of care discussed with the patient and his wife at the bedside Disposition Plan: Home when stable.   IV access:   Peripheral IV Procedures and diagnostic studies:    Dg Abd 2 Views 06/22/2014 Nonobstructive bowel gas pattern. Medical Consultants:   Infectious disease (Dr. Carlyle Basques)   GI IAnti-Infectives:    Flagyl IV 11/26 --> 12/01  Vancomycin PO 11/26 --> 12/01  Fidaxomicin 12/01 -->  Faye Ramsay, MD  [facility] Pager [phone]  If 7PM-7AM, please contact night-coverage [email or web address] Password [email or web address] 06/28/2014, 12:08 PM   LOS: 15 days   HPI/Subjective: No events overnight.   Objective: Filed Vitals:   06/28/14 1130 06/28/14 1135 06/28/14 1140 06/28/14 1145  BP: 110/63 87/41 91/41  90/39  Pulse: 59 62 65 59  Temp:      TempSrc:      Resp: 18 13 13 15   Height:      Weight:      SpO2: 100% 100% 100% 100%    Intake/Output Summary (Last 24 hours) at 06/28/14 1208 Last data filed at 06/28/14 0900  Gross per 24 hour  Intake  992.5 ml  Output      0 ml  Net  992.5 ml    Exam:   General:  Pt is alert, follows commands appropriately, not in acute distress  Cardiovascular: Regular rate and rhythm, S1/S2, no murmurs, no rubs, no gallops  Respiratory: Clear to auscultation bilaterally, no wheezing, no crackles, no rhonchi  Abdomen: Soft, non tender, non distended, bowel sounds present, no guarding   Data Reviewed: Basic Metabolic Panel:  Recent Labs Lab 06/24/14 1206 06/25/14 0540  06/26/14 0542 06/27/14 0425 06/28/14 0530  NA 131* 129* 128* 132* 132*  K 4.0 4.4 4.8 4.0 3.8  CL 102 100 104 106 104  CO2 18* 14* 12* 12* 17*  GLUCOSE 104* 94 107* 97 100*  BUN 18 16 14 10 7   CREATININE 0.87 0.86 0.81 0.75 0.78  CALCIUM 8.5 8.8 8.4 8.1* 8.4   Liver Function Tests:  Recent Labs Lab 06/26/14 0542  AST 48*  ALT 34  ALKPHOS 168*  BILITOT 0.9  PROT 6.3   ALBUMIN 2.8*   CBC:  Recent Labs Lab 06/24/14 0520 06/25/14 0540 06/26/14 0542 06/27/14 0425 06/28/14 0530  WBC 10.3 10.2 10.3 6.5 8.8  HGB 12.8* 14.1 13.0 11.8* 12.5*  HCT 39.9 42.5 40.8 36.9* 38.4*  MCV 94.3 94.0 94.0 93.2 93.7  PLT 127* 289 256 263 291   Recent Results (from the past 240 hour(s))  Clostridium Difficile by PCR     Status: None   Collection Time: 06/25/14 12:04 PM  Result Value Ref Range Status   C difficile by pcr NEGATIVE NEGATIVE Final    Comment: Performed at [facility]     Scheduled Meds: . aspirin  325 mg Oral BID  . cholecalciferol  1,000 Units Oral Daily  . cholestyramine  4 g Oral 3 times per day  . dicyclomine  20 mg Oral QID  . fidaxomicin  200 mg Oral BID  . heparin  5,000 Units Subcutaneous 3 times per day  . nicotine  21 mg Transdermal Daily  . OxyCODONE  15 mg Oral Q12H  . phenytoin  200 mg Oral BID  . protein supplement  1 scoop Oral BID WC  . saccharomyces boulardii  250 mg Oral BID  . sodium bicarbonate  650 mg Oral TID   Continuous Infusions: . sodium chloride 75 mL/hr at 06/27/14 2150

## 2014-06-28 NOTE — Op Note (Signed)
[facility] [place], [id number]   FLEXIBLE SIGMOIDOSCOPY PROCEDURE REPORT  PATIENT: Joshua, Reed  MR#: [id number] BIRTHDATE: May 01, 1946 , 34  yrs. old GENDER: male ENDOSCOPIST: Wilford Corner, MD REFERRED BY: PROCEDURE DATE:  06/28/2014 PROCEDURE:   Sigmoidoscopy with biopsy ASA CLASS:   Class II INDICATIONS:chronic diarrhea. MEDICATIONS: Fentanyl 25 mcg IV and Versed 2 mg IV  DESCRIPTION OF PROCEDURE:   After the risks benefits and alternatives of the procedure were thoroughly explained, informed consent was obtained.  Digital exam revealed no abnormalities of the rectum. The [id number]  endoscope was introduced through the anus and advanced to the splenic flexure , The exam was Without limitations.    The quality of the prep was The overall prep quality was fair. .  The instrument was then slowly withdrawn as the mucosa was fully examined.       Pediatric colonoscope inserted into an unprepped colon and advanced to the splenic flexure. Scattered areas of solid and semi-solid stool were noted. The colonic mucosa was normal in appearance. No pseudomembranes were seen. Random biopsies were taken to check for histology.        Retroflexed views revealed small internal hemorrhoids.    The scope was then withdrawn from the patient and the procedure terminated.  COMPLICATIONS: There were no immediate complications.  ENDOSCOPIC IMPRESSION: Small internal hemorrhoids otherwise normal flexible sigmoidoscopy - s/p random biopsies  RECOMMENDATIONS: F/U on path; Supportive care  REPEAT EXAM:  eSigned:  Wilford Corner, MD 06/28/2014 11:55 AM   CC:

## 2014-06-29 ENCOUNTER — Encounter (HOSPITAL_COMMUNITY): Payer: Self-pay | Admitting: Gastroenterology

## 2014-06-29 LAB — BASIC METABOLIC PANEL
Anion gap: 9 (ref 5–15)
BUN: 5 mg/dL — ABNORMAL LOW (ref 6–23)
CALCIUM: 7.9 mg/dL — AB (ref 8.4–10.5)
CO2: 19 meq/L (ref 19–32)
CREATININE: 0.69 mg/dL (ref 0.50–1.35)
Chloride: 106 mEq/L (ref 96–112)
Glucose, Bld: 95 mg/dL (ref 70–99)
Potassium: 4.5 mEq/L (ref 3.7–5.3)
Sodium: 134 mEq/L — ABNORMAL LOW (ref 137–147)

## 2014-06-29 LAB — CBC
HCT: 36.1 % — ABNORMAL LOW (ref 39.0–52.0)
Hemoglobin: 11.4 g/dL — ABNORMAL LOW (ref 13.0–17.0)
MCH: 29.6 pg (ref 26.0–34.0)
MCHC: 31.6 g/dL (ref 30.0–36.0)
MCV: 93.8 fL (ref 78.0–100.0)
Platelets: 269 10*3/uL (ref 150–400)
RBC: 3.85 MIL/uL — ABNORMAL LOW (ref 4.22–5.81)
RDW: 18.2 % — AB (ref 11.5–15.5)
WBC: 6.7 10*3/uL (ref 4.0–10.5)

## 2014-06-29 MED ORDER — BOOST PLUS PO LIQD
237.0000 mL | Freq: Two times a day (BID) | ORAL | Status: DC
  Administered 2014-06-29: 237 mL via ORAL
  Filled 2014-06-29 (×2): qty 237

## 2014-06-29 NOTE — Progress Notes (Addendum)
[facility] for Infectious Disease          Day # 10 dificid  Subjective:   Underwent flex sig yesterday. Has not had any BM since 5 am. Free of BM for the last 12 hrs, first time since admission  Antibiotics:  Anti-infectives    Start     Dose/Rate Route Frequency Ordered Stop   06/23/14 0000  fidaxomicin (DIFICID) 200 MG TABS tablet     200 mg Oral 2 times daily 06/23/14 1010     06/21/14 2200  fidaxomicin (DIFICID) tablet 200 mg     200 mg Oral 2 times daily 06/21/14 1042     06/21/14 1200  fidaxomicin (DIFICID) tablet 200 mg  Status:  Discontinued     200 mg Oral 2 times daily 06/21/14 1037 06/21/14 1042   06/20/14 2200  fidaxomicin (DIFICID) tablet 200 mg  Status:  Discontinued     200 mg Oral 2 times daily 06/20/14 1704 06/21/14 0935   06/19/14 1000  metroNIDAZOLE (FLAGYL) IVPB 500 mg  Status:  Discontinued     500 mg100 mL/hr over 60 Minutes Intravenous Every 8 hours 06/19/14 0938 06/20/14 1704   06/15/14 1330  vancomycin (VANCOCIN) 50 mg/mL oral solution 500 mg  Status:  Discontinued     500 mg Oral 4 times per day 06/15/14 1324 06/20/14 1704   06/15/14 1330  metroNIDAZOLE (FLAGYL) IVPB 500 mg  Status:  Discontinued     500 mg100 mL/hr over 60 Minutes Intravenous Every 8 hours 06/15/14 1324 06/18/14 1053   06/14/14 1400  metroNIDAZOLE (FLAGYL) tablet 500 mg  Status:  Discontinued     500 mg Oral 3 times per day 06/14/14 0954 06/15/14 1324   06/14/14 0330  ciprofloxacin (CIPRO) IVPB 400 mg  Status:  Discontinued     400 mg200 mL/hr over 60 Minutes Intravenous Every 12 hours 06/14/14 0317 06/14/14 0954   06/14/14 0330  metroNIDAZOLE (FLAGYL) IVPB 500 mg  Status:  Discontinued     500 mg100 mL/hr over 60 Minutes Intravenous Every 8 hours 06/14/14 0317 06/14/14 0954      Medications: Scheduled Meds: . aspirin  325 mg Oral BID  . cholecalciferol  1,000 Units Oral Daily  . dicyclomine  20 mg Oral QID  . fidaxomicin  200 mg Oral BID  . heparin  5,000 Units  Subcutaneous 3 times per day  . nicotine  21 mg Transdermal Daily  . OxyCODONE  15 mg Oral Q12H  . phenytoin  200 mg Oral BID  . protein supplement  1 scoop Oral BID WC  . saccharomyces boulardii  250 mg Oral BID  . sodium bicarbonate  650 mg Oral TID     Objective: Weight change:   Intake/Output Summary (Last 24 hours) at 06/29/14 1748 Last data filed at 06/29/14 0855  Gross per 24 hour  Intake   2880 ml  Output      0 ml  Net   2880 ml   Blood pressure 108/62, pulse 65, temperature 98 F (36.7 C), temperature source Oral, resp. rate 16, height 5\' 9"  (1.753 m), weight 109 lb 14.4 oz (49.85 kg), SpO2 98 %. Temp:  [97.8 F (36.6 C)-98 F (36.7 C)] 98 F (36.7 C) (12/10 1501) Pulse Rate:  [62-65] 65 (12/10 1501) Resp:  [14-16] 16 (12/10 1501) BP: (106-108)/(60-62) 108/62 mmHg (12/10 1501) SpO2:  [98 %-100 %] 98 % (12/10 1501)  Physical Exam: General: sleeping in bed in no distress Abd= soft, scaphoid,  hyperactive bs Ext = no c/c/e Skin = warm  CBC Latest Ref Rng 06/29/2014 06/28/2014 06/27/2014  WBC 4.0 - 10.5 K/uL 6.7 8.8 6.5  Hemoglobin 13.0 - 17.0 g/dL 11.4(L) 12.5(L) 11.8(L)  Hematocrit 39.0 - 52.0 % 36.1(L) 38.4(L) 36.9(L)  Platelets 150 - 400 K/uL 269 291 263      BMET  Recent Labs  06/28/14 0530 06/29/14 0410  NA 132* 134*  K 3.8 4.5  CL 104 106  CO2 17* 19  GLUCOSE 100* 95  BUN 7 5*  CREATININE 0.78 0.69  CALCIUM 8.4 7.9*     Liver Panel  No results for input(s): PROT, ALBUMIN, AST, ALT, ALKPHOS, BILITOT, BILIDIR, IBILI in the last 72 hours.  Micro Results: Recent Results (from the past 720 hour(s))  Clostridium Difficile by PCR     Status: None   Collection Time: 06/05/14  5:31 PM  Result Value Ref Range Status   C difficile by pcr NEGATIVE NEGATIVE Final  Clostridium Difficile by PCR     Status: Abnormal   Collection Time: 06/13/14  9:04 PM  Result Value Ref Range Status   C difficile by pcr POSITIVE (A) NEGATIVE Final    Comment:  PERFORMED AT [place] TO, READ BACK BY AND VERIFIED WITH: Meade Maw AT [id number] 06/14/14 BY [place] Performed at [facility]   Stool culture     Status: None   Collection Time: 06/13/14  9:04 PM  Result Value Ref Range Status   Specimen Description STOOL  Final   Special Requests Normal  Final   Culture   Final    NO SALMONELLA, SHIGELLA, CAMPYLOBACTER, YERSINIA, OR E.COLI 0157:H7 ISOLATED Performed at [facility]    Report Status 06/17/2014 FINAL  Final  Ova and parasite examination     Status: None   Collection Time: 06/14/14  6:54 AM  Result Value Ref Range Status   Specimen Description STOOL  Final   Special Requests NONE  Final   Ova and parasites   Final    NO OVA OR PARASITES SEEN Performed at [facility]    Report Status 06/16/2014 FINAL  Final  Clostridium Difficile by PCR     Status: Abnormal   Collection Time: 06/14/14 11:28 AM  Result Value Ref Range Status   C difficile by pcr POSITIVE (A) NEGATIVE Final    Comment: CRITICAL RESULT CALLED TO, READ BACK BY AND VERIFIED WITH: DUMAS,N RN @ 1406 06/14/14 LEONARD,A Performed at [facility]   Clostridium Difficile by PCR     Status: None   Collection Time: 06/25/14 12:04 PM  Result Value Ref Range Status   C difficile by pcr NEGATIVE NEGATIVE Final    Comment: Performed at [facility]    Studies/Results: No results found.   Assessment/Plan:  Active Problems:   Seizure disorder   COPD (chronic obstructive pulmonary disease)   Alcohol abuse   Vitamin D deficiency   Macular degeneration   Distal radius fracture, right   Intertrochanteric fracture of right hip   C. difficile colitis   Metabolic acidosis   Protein-calorie malnutrition, severe   Poor dentition   Diarrhea   Hyperkalemia   Screening for STD (sexually transmitted disease)   Dehydration   Weakness   Generalized abdominal pain    Joshua Reed is a 68 y.o. male  with difficult to treat CDI  #1 Clostridium difficile colitis:  --continue DIFICID and finish out 14 days course  of therapy -AVOID PPI, H2Blockers  #2 chronic diarrhea, possibly due to post infectious irritable bowel disease. Since repeat cdiff is negative. Diarrhea has stopped since having flex sig. Mar hx does not reveal that he was given immodium or lomotil. Can use prn if needed but appears to being turning the corner - will stop questran since it may be binding his other medications  #3 health maintenance Screening: HIV and Hep panel negative  - will have him follow up in the ID clinic   LOS: 16 days   Ida, [place] 06/29/2014, 5:48 PM

## 2014-06-29 NOTE — Progress Notes (Addendum)
Patient ID: Joshua Reed, male   DOB: March 29, 1946, 68 y.o.   MRN: [id number] [facility] Gastroenterology Progress Note  MONTRAVIOUS Reed 68 y.o. [birth year]   Subjective: Loose stools several times overnight but no stools since 5 AM. Feels good. Denies abdominal pain. Resting but easily arousable. Wife at bedside.  Objective: Vital signs in last 24 hours: Filed Vitals:   06/28/14 2119  BP: 106/60  Pulse: 62  Temp: 97.8 F (36.6 C)  Resp: 14    Physical Exam: Gen: alert, no acute distress Abd: soft, nontender, nondistended  Lab Results:  Recent Labs  06/28/14 0530 06/29/14 0410  NA 132* 134*  K 3.8 4.5  CL 104 106  CO2 17* 19  GLUCOSE 100* 95  BUN 7 5*  CREATININE 0.78 0.69  CALCIUM 8.4 7.9*   No results for input(s): AST, ALT, ALKPHOS, BILITOT, PROT, ALBUMIN in the last 72 hours.  Recent Labs  06/28/14 0530 06/29/14 0410  WBC 8.8 6.7  HGB 12.5* 11.4*  HCT 38.4* 36.1*  MCV 93.7 93.8  PLT 291 269   No results for input(s): LABPROT, INR in the last 72 hours.    Assessment/Plan: Diarrhea - negative flex sig (biopsies pending); Imodium prn. Lomotil if Imodium not helping but since no stools since 5 AM hopefully diarrhea is resolving. If diarrhea minimal today, then would d/c tomorrow. F/U on biopsies as outpt next week.   Joshua C. 06/29/2014, 12:01 PM

## 2014-06-29 NOTE — Progress Notes (Signed)
NUTRITION FOLLOW UP  Intervention:   -D/C Boost Plus -Encourage PO intake -RD to continue to monitor  Nutrition Dx:   Inadequate oral intake related to diarrhea as evidenced by wt loss and poor po, ongoing  Goal:   Pt to meet >/= 90% of their estimated nutrition needs; meeting  Monitor:   Weight trend, po and supplemental intake, labs  Assessment:   68 y.o. year old male with significant past medical history of COPD, ETOH abuse, seizure disorder, recent R distal radius and right hip fracture s/p fall presenting with diarrhea.  PO intake:100%. Pt is receiving meals and snacks provided by wife. Pt reports improved appetite.  Pt has had some recurring episodes of loose stools. Last episode was last night, no stools since 5 am. Pt was ordered Imodium PRN. Discussed foods that help with diarrhea and firm stools. Pt may discharge tomorrow.  Pt requests Boost Plus supplements be discontinued. He plans to not drink them. Prefers the Boost bought from stores.  Labs reviewed: Low Na & BUN  Height: Ht Readings from Last 1 Encounters:  06/19/14 5\' 9"  (1.753 m)    Weight Status:   Wt Readings from Last 1 Encounters:  06/27/14 109 lb 14.4 oz (49.85 kg)    Re-estimated needs:  Kcal: 1500-1700 Protein: 85-95 g Fluid: >1.7 L/day  Skin: intact  Diet Order: Diet regular   Intake/Output Summary (Last 24 hours) at 06/29/14 1429 Last data filed at 06/29/14 0855  Gross per 24 hour  Intake   2880 ml  Output    100 ml  Net   2780 ml    Last BM: 12/10, watery   Labs:   Recent Labs Lab 06/27/14 0425 06/28/14 0530 06/29/14 0410  NA 132* 132* 134*  K 4.0 3.8 4.5  CL 106 104 106  CO2 12* 17* 19  BUN 10 7 5*  CREATININE 0.75 0.78 0.69  CALCIUM 8.1* 8.4 7.9*  GLUCOSE 97 100* 95    CBG (last 3)  No results for input(s): GLUCAP in the last 72 hours.  Scheduled Meds: . aspirin  325 mg Oral BID  . cholecalciferol  1,000 Units Oral Daily  . cholestyramine  4 g Oral 3  times per day  . dicyclomine  20 mg Oral QID  . fidaxomicin  200 mg Oral BID  . heparin  5,000 Units Subcutaneous 3 times per day  . lactose free nutrition  237 mL Oral BID BM  . nicotine  21 mg Transdermal Daily  . OxyCODONE  15 mg Oral Q12H  . phenytoin  200 mg Oral BID  . protein supplement  1 scoop Oral BID WC  . saccharomyces boulardii  250 mg Oral BID  . sodium bicarbonate  650 mg Oral TID    Continuous Infusions: . sodium chloride 75 mL/hr at 06/29/14 0807   Clayton Bibles, MS, RD, LDN Pager: [phone] After Hours Pager: [phone]

## 2014-06-29 NOTE — Progress Notes (Signed)
Patient ID: Joshua Reed, male   DOB: [birth year], 68 y.o.   MRN: [id number]  [facility] PROGRESS NOTE  Joshua Reed DJM:[id number] DOB: 16-Jan-1946 DOA: 06/13/2014 PCP: Gennette Pac, MD   Brief narrative:    68 y.o. year old male with COPD, ETOH abuse, seizure disorder, recent R distal radius and right hip fracture s/p fall presented with persistent watery diarrhea since discharge. Patient noted to have been recently discharged from short-term rehabilitation (11/09 - 11/17). Last colonoscopy w/ [facility] last year that was normal per pt.   Patient's hospital course is complicated with ongoing perfuse diarrhea. Patient is currently on fidoxamicin started no 06/20/2014.  Assessment/Plan:   Principal Problem: Severe C diff Diarrhea / leukocytosis - initially treated with oral vancomycin and IV Flagyl, both ABX started 11/16 and now d/c per ID rec's - started on fidoxamicin 12/01 - pt reports less diarrhea over the past 24 hours  - continue IV fluids, NS @ 75 cc/hr  - appreciate GI team recommendations, flex sig positive for hemorrhoids but otherwise unremarkable, bx sent  Active Problems: Anion gap metabolic acidosis - likely secondary to above - Initially resolved but acidosis evident again 06/22/2014  - improving with IVF and bicarb  - continue sodium bicarbonate for now  - Follow-up BMP in morning Hyponatremia - most likely secondary to the GI losses, diarrhea - improving  Hypokalemia / hyperkalemia - Potassium initially supplemented and with resulting hyperkalemia. Potassium was 5.4 (12/03) - K is WNL this AM ETOH use - currently denies - CIWA  COPD  - stable  - no resp distress/cough/wheezing Right distal radius/R hip fx  - 6 x 4 cm postoperative right gluteal hematoma. - Not a concern in the setting of stable hemoglobin Protein calorie malnutrition, severe - Nutrition consultation obtained  DVT prophylaxis: - Heparin SQ    Code Status: Full.  Family Communication: plan of care discussed with the patient and his wife at the bedside Disposition Plan: Home when stable.   IV access:   Peripheral IV Procedures and diagnostic studies:    Dg Abd 2 Views 06/22/2014 Nonobstructive bowel gas pattern. Medical Consultants:   Infectious disease (Dr. Carlyle Basques)   GI IAnti-Infectives:    Flagyl IV 11/26 --> 12/01  Vancomycin PO 11/26 --> 12/01  Fidaxomicin 12/01 -->  Faye Ramsay, MD  [facility] Pager [phone]  If 7PM-7AM, please contact night-coverage [email or web address] Password [email or web address] 06/29/2014, 3:28 PM   LOS: 16 days   HPI/Subjective: No events overnight.   Objective: Filed Vitals:   06/28/14 1200 06/28/14 1447 06/28/14 2119 06/29/14 1501  BP: 100/52 112/63 106/60 108/62  Pulse: 57 67 62 65  Temp:  97.4 F (36.3 C) 97.8 F (36.6 C) 98 F (36.7 C)  TempSrc:  Oral Oral Oral  Resp: 12 16 14 16   Height:      Weight:      SpO2: 95% 100% 100% 98%    Intake/Output Summary (Last 24 hours) at 06/29/14 1528 Last data filed at 06/29/14 0855  Gross per 24 hour  Intake   2880 ml  Output    100 ml  Net   2780 ml    Exam:   General:  Pt is alert, follows commands appropriately, not in acute distress  Cardiovascular: Regular rate and rhythm, S1/S2, no murmurs, no rubs, no gallops  Respiratory: Clear to auscultation bilaterally, no wheezing, no crackles, no rhonchi  Abdomen: Soft, non tender, non distended, bowel sounds present, no guarding  Extremities: No edema, pulses  DP and PT palpable bilaterally  Neuro: Grossly nonfocal  Data Reviewed: Basic Metabolic Panel:  Recent Labs Lab 06/25/14 0540 06/26/14 0542 06/27/14 0425 06/28/14 0530 06/29/14 0410  NA 129* 128* 132* 132* 134*  K 4.4 4.8 4.0 3.8 4.5  CL 100 104 106 104 106  CO2 14* 12* 12* 17* 19  GLUCOSE 94 107* 97 100* 95  BUN 16 14 10 7  5*  CREATININE 0.86 0.81 0.75 0.78 0.69  CALCIUM  8.8 8.4 8.1* 8.4 7.9*   Liver Function Tests:  Recent Labs Lab 06/26/14 0542  AST 48*  ALT 34  ALKPHOS 168*  BILITOT 0.9  PROT 6.3  ALBUMIN 2.8*   CBC:  Recent Labs Lab 06/25/14 0540 06/26/14 0542 06/27/14 0425 06/28/14 0530 06/29/14 0410  WBC 10.2 10.3 6.5 8.8 6.7  HGB 14.1 13.0 11.8* 12.5* 11.4*  HCT 42.5 40.8 36.9* 38.4* 36.1*  MCV 94.0 94.0 93.2 93.7 93.8  PLT 289 256 263 291 269   Recent Results (from the past 240 hour(s))  Clostridium Difficile by PCR     Status: None   Collection Time: 06/25/14 12:04 PM  Result Value Ref Range Status   C difficile by pcr NEGATIVE NEGATIVE Final    Comment: Performed at [facility]     Scheduled Meds: . aspirin  325 mg Oral BID  . cholecalciferol  1,000 Units Oral Daily  . cholestyramine  4 g Oral 3 times per day  . dicyclomine  20 mg Oral QID  . fidaxomicin  200 mg Oral BID  . heparin  5,000 Units Subcutaneous 3 times per day  . nicotine  21 mg Transdermal Daily  . OxyCODONE  15 mg Oral Q12H  . phenytoin  200 mg Oral BID  . protein supplement  1 scoop Oral BID WC  . saccharomyces boulardii  250 mg Oral BID  . sodium bicarbonate  650 mg Oral TID   Continuous Infusions: . sodium chloride 75 mL/hr at 06/29/14 0807

## 2014-06-30 LAB — BASIC METABOLIC PANEL
ANION GAP: 10 (ref 5–15)
BUN: 5 mg/dL — AB (ref 6–23)
CHLORIDE: 104 meq/L (ref 96–112)
CO2: 19 mEq/L (ref 19–32)
Calcium: 8 mg/dL — ABNORMAL LOW (ref 8.4–10.5)
Creatinine, Ser: 0.61 mg/dL (ref 0.50–1.35)
Glucose, Bld: 98 mg/dL (ref 70–99)
POTASSIUM: 3.9 meq/L (ref 3.7–5.3)
SODIUM: 133 meq/L — AB (ref 137–147)

## 2014-06-30 LAB — CBC
HCT: 35.1 % — ABNORMAL LOW (ref 39.0–52.0)
Hemoglobin: 11.4 g/dL — ABNORMAL LOW (ref 13.0–17.0)
MCH: 29.8 pg (ref 26.0–34.0)
MCHC: 32.5 g/dL (ref 30.0–36.0)
MCV: 91.6 fL (ref 78.0–100.0)
PLATELETS: 270 10*3/uL (ref 150–400)
RBC: 3.83 MIL/uL — ABNORMAL LOW (ref 4.22–5.81)
RDW: 18 % — ABNORMAL HIGH (ref 11.5–15.5)
WBC: 7.1 10*3/uL (ref 4.0–10.5)

## 2014-06-30 MED ORDER — BUDESONIDE 3 MG PO CP24
9.0000 mg | ORAL_CAPSULE | Freq: Every day | ORAL | Status: DC
  Administered 2014-06-30 – 2014-07-03 (×4): 9 mg via ORAL
  Filled 2014-06-30 (×4): qty 3

## 2014-06-30 NOTE — Progress Notes (Signed)
Patient ID: Joshua Reed, male   DOB: [birth year], 68 y.o.   MRN: [id number] [facility] Gastroenterology Progress Note  Joshua Reed 68 y.o. Apr 24, 1946   Subjective: Reports recurrence of loose stools last night around 6 pm and he thinks he had around 7-8 loose stools overnight (4 recorded in chart).  Objective: Vital signs: Filed Vitals:   06/30/14 0533  BP: 112/69  Pulse: 71  Temp: 97.9 F (36.6 C)  Resp: 14    Physical Exam: Gen: alert, no acute distress  Abd: soft, nontender, nondistended, +BS  Lab Results:  Recent Labs  06/29/14 0410 06/30/14 0440  NA 134* 133*  K 4.5 3.9  CL 106 104  CO2 19 19  GLUCOSE 95 98  BUN 5* 5*  CREATININE 0.69 0.61  CALCIUM 7.9* 8.0*   No results for input(s): AST, ALT, ALKPHOS, BILITOT, PROT, ALBUMIN in the last 72 hours.  Recent Labs  06/29/14 0410 06/30/14 0440  WBC 6.7 7.1  HGB 11.4* 11.4*  HCT 36.1* 35.1*  MCV 93.8 91.6  PLT 269 270      Assessment/Plan:  Biopsies from colon show lymphocytic colitis and will start Budesonide 9 mg/day now and to continue as an outpt. If diarrhea subsides today then home later today on the Budesonide and completion of the Dificid course but if diarrhea resumes today then may need to keep in the hospital another day. Dr. Cristina Gong available to see this weekend if patient not discharged today.    Joshua C. 06/30/2014, 11:19 AM

## 2014-06-30 NOTE — Progress Notes (Signed)
[facility] for Infectious Disease          Day # 11 dificid  Subjective:   Now having loose stool, path showing lymphocytic colitis from flex sig  Antibiotics:  Anti-infectives    Start     Dose/Rate Route Frequency Ordered Stop   06/23/14 0000  fidaxomicin (DIFICID) 200 MG TABS tablet     200 mg Oral 2 times daily 06/23/14 1010     06/21/14 2200  fidaxomicin (DIFICID) tablet 200 mg     200 mg Oral 2 times daily 06/21/14 1042     06/21/14 1200  fidaxomicin (DIFICID) tablet 200 mg  Status:  Discontinued     200 mg Oral 2 times daily 06/21/14 1037 06/21/14 1042   06/20/14 2200  fidaxomicin (DIFICID) tablet 200 mg  Status:  Discontinued     200 mg Oral 2 times daily 06/20/14 1704 06/21/14 0935   06/19/14 1000  metroNIDAZOLE (FLAGYL) IVPB 500 mg  Status:  Discontinued     500 mg100 mL/hr over 60 Minutes Intravenous Every 8 hours 06/19/14 0938 06/20/14 1704   06/15/14 1330  vancomycin (VANCOCIN) 50 mg/mL oral solution 500 mg  Status:  Discontinued     500 mg Oral 4 times per day 06/15/14 1324 06/20/14 1704   06/15/14 1330  metroNIDAZOLE (FLAGYL) IVPB 500 mg  Status:  Discontinued     500 mg100 mL/hr over 60 Minutes Intravenous Every 8 hours 06/15/14 1324 06/18/14 1053   06/14/14 1400  metroNIDAZOLE (FLAGYL) tablet 500 mg  Status:  Discontinued     500 mg Oral 3 times per day 06/14/14 0954 06/15/14 1324   06/14/14 0330  ciprofloxacin (CIPRO) IVPB 400 mg  Status:  Discontinued     400 mg200 mL/hr over 60 Minutes Intravenous Every 12 hours 06/14/14 0317 06/14/14 0954   06/14/14 0330  metroNIDAZOLE (FLAGYL) IVPB 500 mg  Status:  Discontinued     500 mg100 mL/hr over 60 Minutes Intravenous Every 8 hours 06/14/14 0317 06/14/14 0954      Medications: Scheduled Meds: . aspirin  325 mg Oral BID  . budesonide  9 mg Oral Daily  . cholecalciferol  1,000 Units Oral Daily  . dicyclomine  20 mg Oral QID  . fidaxomicin  200 mg Oral BID  . heparin  5,000 Units Subcutaneous 3 times  per day  . nicotine  21 mg Transdermal Daily  . OxyCODONE  15 mg Oral Q12H  . phenytoin  200 mg Oral BID  . protein supplement  1 scoop Oral BID WC  . saccharomyces boulardii  250 mg Oral BID  . sodium bicarbonate  650 mg Oral TID     Objective: Weight change:   Intake/Output Summary (Last 24 hours) at 06/30/14 1128 Last data filed at 06/30/14 0600  Gross per 24 hour  Intake   1800 ml  Output      0 ml  Net   1800 ml   Blood pressure 112/69, pulse 71, temperature 97.9 F (36.6 C), temperature source Oral, resp. rate 14, height 5\' 9"  (1.753 m), weight 109 lb 14.4 oz (49.85 kg), SpO2 100 %. Temp:  [97.9 F (36.6 C)-98 F (36.7 C)] 97.9 F (36.6 C) (12/11 0533) Pulse Rate:  [65-71] 71 (12/11 0533) Resp:  [14-16] 14 (12/11 0533) BP: (108-112)/(62-69) 112/69 mmHg (12/11 0533) SpO2:  [98 %-100 %] 100 % (12/11 0533)  Physical Exam: Did not examine  CBC Latest Ref Rng 06/30/2014 06/29/2014 06/28/2014  WBC 4.0 -  10.5 K/uL 7.1 6.7 8.8  Hemoglobin 13.0 - 17.0 g/dL 11.4(L) 11.4(L) 12.5(L)  Hematocrit 39.0 - 52.0 % 35.1(L) 36.1(L) 38.4(L)  Platelets 150 - 400 K/uL 270 269 291      BMET  Recent Labs  06/29/14 0410 06/30/14 0440  NA 134* 133*  K 4.5 3.9  CL 106 104  CO2 19 19  GLUCOSE 95 98  BUN 5* 5*  CREATININE 0.69 0.61  CALCIUM 7.9* 8.0*     Liver Panel  No results for input(s): PROT, ALBUMIN, AST, ALT, ALKPHOS, BILITOT, BILIDIR, IBILI in the last 72 hours.  Micro Results: Recent Results (from the past 720 hour(s))  Clostridium Difficile by PCR     Status: None   Collection Time: 06/05/14  5:31 PM  Result Value Ref Range Status   C difficile by pcr NEGATIVE NEGATIVE Final  Clostridium Difficile by PCR     Status: Abnormal   Collection Time: 06/13/14  9:04 PM  Result Value Ref Range Status   C difficile by pcr POSITIVE (A) NEGATIVE Final    Comment: PERFORMED AT [place] TO, READ BACK BY AND VERIFIED WITH: Meade Maw AT  3299 06/14/14 BY [place] Performed at [facility]   Stool culture     Status: None   Collection Time: 06/13/14  9:04 PM  Result Value Ref Range Status   Specimen Description STOOL  Final   Special Requests Normal  Final   Culture   Final    NO SALMONELLA, SHIGELLA, CAMPYLOBACTER, YERSINIA, OR E.COLI 0157:H7 ISOLATED Performed at [facility]    Report Status 06/17/2014 FINAL  Final  Ova and parasite examination     Status: None   Collection Time: 06/14/14  6:54 AM  Result Value Ref Range Status   Specimen Description STOOL  Final   Special Requests NONE  Final   Ova and parasites   Final    NO OVA OR PARASITES SEEN Performed at [facility]    Report Status 06/16/2014 FINAL  Final  Clostridium Difficile by PCR     Status: Abnormal   Collection Time: 06/14/14 11:28 AM  Result Value Ref Range Status   C difficile by pcr POSITIVE (A) NEGATIVE Final    Comment: CRITICAL RESULT CALLED TO, READ BACK BY AND VERIFIED WITH: DUMAS,N RN @ 1406 06/14/14 LEONARD,A Performed at [facility]   Clostridium Difficile by PCR     Status: None   Collection Time: 06/25/14 12:04 PM  Result Value Ref Range Status   C difficile by pcr NEGATIVE NEGATIVE Final    Comment: Performed at [facility]    Studies/Results: No results found.   Assessment/Plan:  Active Problems:   Seizure disorder   COPD (chronic obstructive pulmonary disease)   Alcohol abuse   Vitamin D deficiency   Macular degeneration   Distal radius fracture, right   Intertrochanteric fracture of right hip   C. difficile colitis   Metabolic acidosis   Protein-calorie malnutrition, severe   Poor dentition   Diarrhea   Hyperkalemia   Screening for STD (sexually transmitted disease)   Dehydration   Weakness   Generalized abdominal pain    Joshua Reed is a 68 y.o. male with difficult to treat CDI  #1 Clostridium difficile colitis:  --continue DIFICID and finish out 14 days  course of therapy. Currently on day 11 of 14 -AVOID PPI, H2Blockers  #2 chronic diarrhea, thought to be due to lymphocytic  colitis. Being started on budesonide per GI.  - will sign off. Can give him  ID clinic phone number to come back if he has recurrence of c.difficile. [phone]. No need for follow up at this point   LOS: 17 days   Saramarie Stinger 06/30/2014, 11:28 AM

## 2014-06-30 NOTE — Progress Notes (Signed)
Patient ID: Joshua Reed, male   DOB: May 03, 1946, 68 y.o.   MRN: [id number]  [facility] PROGRESS NOTE  Joshua Reed ELF:[id number] DOB: 06-16-1946 DOA: 06/13/2014 PCP: Gennette Pac, MD   Brief narrative:    68 y.o. year old male with COPD, ETOH abuse, seizure disorder, recent R distal radius and right hip fracture s/p fall presented with persistent watery diarrhea since discharge. Patient noted to have been recently discharged from short-term rehabilitation (11/09 - 11/17). Last colonoscopy w/ [facility] last year that was normal per pt.   Patient's hospital course is complicated with ongoing perfuse diarrhea. Patient is currently on fidoxamicin started no 06/20/2014.  Assessment/Plan:   Principal Problem: Severe C diff Diarrhea / leukocytosis - initially treated with oral vancomycin and IV Flagyl, both ABX started 11/16 and now d/c per ID rec's - started on fidoxamicin 12/01 - Biopsies show lymphocytic colitis, per GI, start Budesonide 9 mg/day now and to continue as an outpt - continue IV fluids, NS @ 75 cc/hr  - appreciate GI team recommendations Active Problems: Anion gap metabolic acidosis - likely secondary to above - Initially resolved but acidosis evident again 06/22/2014  - improving with IVF and bicarb  - continue sodium bicarbonate for now  - Follow-up BMP in morning Hyponatremia - most likely secondary to the GI losses, diarrhea - improving  Hypokalemia / hyperkalemia - Potassium initially supplemented and with resulting hyperkalemia. Potassium was 5.4 (12/03) - K is WNL this AM ETOH use - currently denies - CIWA  COPD  - stable  - no resp distress/cough/wheezing Right distal radius/R hip fx  - 6 x 4 cm postoperative right gluteal hematoma. - Not a concern in the setting of stable hemoglobin Protein calorie malnutrition, severe - Nutrition consultation obtained  DVT prophylaxis: - Heparin SQ   Code Status:  Full.  Family Communication: plan of care discussed with the patient and his wife at the bedside Disposition Plan: Home when stable.   IV access:   Peripheral IV Procedures and diagnostic studies:    Dg Abd 2 Views 06/22/2014 Nonobstructive bowel gas pattern. Medical Consultants:   Infectious disease (Dr. Carlyle Basques)   GI IAnti-Infectives:    Flagyl IV 11/26 --> 12/01  Vancomycin PO 11/26 --> 12/01  Fidaxomicin 12/01 -->    Faye Ramsay, MD  [facility] Pager [phone]  If 7PM-7AM, please contact night-coverage [email or web address] Password [email or web address] 06/30/2014, 6:11 PM   LOS: 17 days   HPI/Subjective: No events overnight.   Objective: Filed Vitals:   06/28/14 2119 06/29/14 1501 06/30/14 0533 06/30/14 1330  BP: 106/60 108/62 112/69 103/60  Pulse: 62 65 71 71  Temp: 97.8 F (36.6 C) 98 F (36.7 C) 97.9 F (36.6 C) 98 F (36.7 C)  TempSrc: Oral Oral Oral Oral  Resp: 14 16 14 16   Height:      Weight:      SpO2: 100% 98% 100% 96%    Intake/Output Summary (Last 24 hours) at 06/30/14 1811 Last data filed at 06/30/14 1430  Gross per 24 hour  Intake   2400 ml  Output      0 ml  Net   2400 ml    Exam:   General:  Pt is alert, follows commands appropriately, not in acute distress  Cardiovascular: Regular rate and rhythm, S1/S2, no murmurs, no rubs, no gallops  Respiratory: Clear to auscultation bilaterally, no wheezing, no crackles, no rhonchi  Abdomen: Soft, non tender, non distended, bowel sounds present, no guarding  Extremities:  No edema, pulses DP and PT palpable bilaterally  Neuro: Grossly nonfocal  Data Reviewed: Basic Metabolic Panel:  Recent Labs Lab 06/26/14 0542 06/27/14 0425 06/28/14 0530 06/29/14 0410 06/30/14 0440  NA 128* 132* 132* 134* 133*  K 4.8 4.0 3.8 4.5 3.9  CL 104 106 104 106 104  CO2 12* 12* 17* 19 19  GLUCOSE 107* 97 100* 95 98  BUN 14 10 7  5* 5*  CREATININE 0.81 0.75 0.78 0.69 0.61   CALCIUM 8.4 8.1* 8.4 7.9* 8.0*   Liver Function Tests:  Recent Labs Lab 06/26/14 0542  AST 48*  ALT 34  ALKPHOS 168*  BILITOT 0.9  PROT 6.3  ALBUMIN 2.8*   CBC:  Recent Labs Lab 06/26/14 0542 06/27/14 0425 06/28/14 0530 06/29/14 0410 06/30/14 0440  WBC 10.3 6.5 8.8 6.7 7.1  HGB 13.0 11.8* 12.5* 11.4* 11.4*  HCT 40.8 36.9* 38.4* 36.1* 35.1*  MCV 94.0 93.2 93.7 93.8 91.6  PLT 256 263 291 269 270   Cardiac Enzymes: No results for input(s): CKTOTAL, CKMB, CKMBINDEX, TROPONINI in the last 168 hours. BNP: Invalid input(s): POCBNP CBG: No results for input(s): GLUCAP in the last 168 hours.  Recent Results (from the past 240 hour(s))  Clostridium Difficile by PCR     Status: None   Collection Time: 06/25/14 12:04 PM  Result Value Ref Range Status   C difficile by pcr NEGATIVE NEGATIVE Final    Comment: Performed at [facility]     Scheduled Meds: . aspirin  325 mg Oral BID  . budesonide  9 mg Oral Daily  . cholecalciferol  1,000 Units Oral Daily  . dicyclomine  20 mg Oral QID  . fidaxomicin  200 mg Oral BID  . heparin  5,000 Units Subcutaneous 3 times per day  . nicotine  21 mg Transdermal Daily  . OxyCODONE  15 mg Oral Q12H  . phenytoin  200 mg Oral BID  . protein supplement  1 scoop Oral BID WC  . saccharomyces boulardii  250 mg Oral BID  . sodium bicarbonate  650 mg Oral TID   Continuous Infusions: . sodium chloride 75 mL/hr at 06/30/14 1537

## 2014-07-01 LAB — CBC
HCT: 33.2 % — ABNORMAL LOW (ref 39.0–52.0)
HEMOGLOBIN: 10.7 g/dL — AB (ref 13.0–17.0)
MCH: 29.7 pg (ref 26.0–34.0)
MCHC: 32.2 g/dL (ref 30.0–36.0)
MCV: 92.2 fL (ref 78.0–100.0)
PLATELETS: 266 10*3/uL (ref 150–400)
RBC: 3.6 MIL/uL — AB (ref 4.22–5.81)
RDW: 18 % — ABNORMAL HIGH (ref 11.5–15.5)
WBC: 6.7 10*3/uL (ref 4.0–10.5)

## 2014-07-01 LAB — BASIC METABOLIC PANEL
Anion gap: 10 (ref 5–15)
BUN: 5 mg/dL — ABNORMAL LOW (ref 6–23)
CO2: 19 meq/L (ref 19–32)
Calcium: 7.4 mg/dL — ABNORMAL LOW (ref 8.4–10.5)
Chloride: 108 mEq/L (ref 96–112)
Creatinine, Ser: 0.57 mg/dL (ref 0.50–1.35)
GFR calc Af Amer: >90 mL/min (ref >90)
Glucose, Bld: 85 mg/dL (ref 70–99)
POTASSIUM: 3.6 meq/L — AB (ref 3.7–5.3)
SODIUM: 137 meq/L (ref 137–147)

## 2014-07-01 MED ORDER — PANCRELIPASE (LIP-PROT-AMYL) 12000-38000 UNITS PO CPEP
12000.0000 [IU] | ORAL_CAPSULE | Freq: Three times a day (TID) | ORAL | Status: DC
  Administered 2014-07-01 – 2014-07-03 (×6): 12000 [IU] via ORAL
  Filled 2014-07-01 (×10): qty 1

## 2014-07-01 MED ORDER — POTASSIUM CHLORIDE CRYS ER 20 MEQ PO TBCR
40.0000 meq | EXTENDED_RELEASE_TABLET | Freq: Once | ORAL | Status: AC
  Administered 2014-07-01: 40 meq via ORAL
  Filled 2014-07-01: qty 2

## 2014-07-01 NOTE — Progress Notes (Signed)
Patient still having significant nocturnal diarrhea, without bowel movements during the daytime. He thinks he had 8 bowel movements last night.  He is being treated for C. difficile with fidaxomicin and Saccharomyces probiotic. Most recent C. difficile PCR was negative.  He is being treated for lymphocytic colitis with budesonide, but he just got his first dose yesterday morning.  Impression: Persistent diarrhea, probably related to lymphocytic colitis in view of the currently negative C. difficile PCR.  Plan: Continue current management. I anticipate that he will start to show significant improvement in the next several days. It typically takes budesonide several days to bring about a therapeutic effect with lymphocytic colitis.  Cleotis Nipper, M.D. [phone]

## 2014-07-01 NOTE — Progress Notes (Signed)
Patient ID: Joshua Reed, male   DOB: 19-Apr-1946, 68 y.o.   MRN: [id number]  [facility] PROGRESS NOTE  Joshua Reed:[id number] DOB: October 02, 1945 DOA: 06/13/2014 PCP: Gennette Pac, MD  Brief narrative:    68 y.o. year old male with COPD, ETOH abuse, seizure disorder, recent R distal radius and right hip fracture s/p fall presented with persistent watery diarrhea since discharge. Patient noted to have been recently discharged from short-term rehabilitation (11/09 - 11/17). Last colonoscopy w/ [facility] last year that was normal per pt.   Patient's hospital course is complicated with ongoing perfuse diarrhea. Patient is currently on fidoxamicin started no 06/20/2014.  Assessment/Plan:   Principal Problem: Severe C diff Diarrhea / leukocytosis - initially treated with oral vancomycin and IV Flagyl, both ABX started 11/16 and now d/c per ID rec's - started on fidoxamicin 12/01 - Biopsies show lymphocytic colitis, per GI, start Budesonide 9 mg/day now and to continue as an outpt - pt still with over 15 diarrheas over the past 24 hours - appreciate GI team recommendations Active Problems: Anion gap metabolic acidosis - likely secondary to above - Initially resolved but acidosis evident again 06/22/2014  - improving with IVF and bicarb  - continue sodium bicarbonate for now  - Follow-up BMP in morning Hyponatremia - most likely secondary to the GI losses, diarrhea - improving and WNL this AM Hypokalemia / hyperkalemia - Potassium initially supplemented and with resulting hyperkalemia. Potassium was 5.4 (12/03) - will supplement as needed  ETOH use - currently denies - CIWA  COPD  - stable  - no resp distress/cough/wheezing Right distal radius/R hip fx  - 6 x 4 cm postoperative right gluteal hematoma. - Not a concern in the setting of stable hemoglobin Protein calorie malnutrition, severe - Nutrition consultation obtained  DVT  prophylaxis: - Heparin SQ   Code Status: Full.  Family Communication: plan of care discussed with the patient and his wife at the bedside Disposition Plan: Home when stable.   IV access:   Peripheral IV Procedures and diagnostic studies:    Dg Abd 2 Views 06/22/2014 Nonobstructive bowel gas pattern. Medical Consultants:   Infectious disease (Dr. Carlyle Basques)   GI IAnti-Infectives:    Flagyl IV 11/26 --> 12/01  Vancomycin PO 11/26 --> 12/01  Fidaxomicin 12/01 -->  Faye Ramsay, MD  [facility] Pager [phone]  If 7PM-7AM, please contact night-coverage [email or web address] Password [email or web address] 07/01/2014, 1:18 PM   LOS: 18 days   HPI/Subjective: No events overnight.   Objective: Filed Vitals:   06/30/14 0533 06/30/14 1330 06/30/14 2030 07/01/14 0542  BP: 112/69 103/60 105/68 105/60  Pulse: 71 71 73 64  Temp: 97.9 F (36.6 C) 98 F (36.7 C) 98.1 F (36.7 C) 98 F (36.7 C)  TempSrc: Oral Oral Oral Oral  Resp: 14 16 20 16   Height:      Weight:      SpO2: 100% 96% 97% 96%    Intake/Output Summary (Last 24 hours) at 07/01/14 1318 Last data filed at 07/01/14 0900  Gross per 24 hour  Intake   2070 ml  Output    300 ml  Net   1770 ml    Exam:   General:  Pt is alert, follows commands appropriately, not in acute distress  Cardiovascular: Regular rate and rhythm, S1/S2, no murmurs, no rubs, no gallops  Respiratory: Clear to auscultation bilaterally, no wheezing, no crackles, no rhonchi  Abdomen: Soft, non tender, non distended, bowel sounds present, no guarding  Extremities: No edema, pulses DP and PT palpable bilaterally  Neuro: Grossly nonfocal  Data Reviewed: Basic Metabolic Panel:  Recent Labs Lab 06/27/14 0425 06/28/14 0530 06/29/14 0410 06/30/14 0440 07/01/14 0615  NA 132* 132* 134* 133* 137  K 4.0 3.8 4.5 3.9 3.6*  CL 106 104 106 104 108  CO2 12* 17* 19 19 19   GLUCOSE 97 100* 95 98 85  BUN 10 7 5* 5*  5*  CREATININE 0.75 0.78 0.69 0.61 0.57  CALCIUM 8.1* 8.4 7.9* 8.0* 7.4*   Liver Function Tests:  Recent Labs Lab 06/26/14 0542  AST 48*  ALT 34  ALKPHOS 168*  BILITOT 0.9  PROT 6.3  ALBUMIN 2.8*   CBC:  Recent Labs Lab 06/27/14 0425 06/28/14 0530 06/29/14 0410 06/30/14 0440 07/01/14 0615  WBC 6.5 8.8 6.7 7.1 6.7  HGB 11.8* 12.5* 11.4* 11.4* 10.7*  HCT 36.9* 38.4* 36.1* 35.1* 33.2*  MCV 93.2 93.7 93.8 91.6 92.2  PLT 263 291 269 270 266    Recent Results (from the past 240 hour(s))  Clostridium Difficile by PCR     Status: None   Collection Time: 06/25/14 12:04 PM  Result Value Ref Range Status   C difficile by pcr NEGATIVE NEGATIVE Final    Comment: Performed at [facility]     Scheduled Meds: . aspirin  325 mg Oral BID  . budesonide  9 mg Oral Daily  . cholecalciferol  1,000 Units Oral Daily  . dicyclomine  20 mg Oral QID  . fidaxomicin  200 mg Oral BID  . heparin  5,000 Units Subcutaneous 3 times per day  . lipase/protease/amylase  12,000 Units Oral TID WC  . nicotine  21 mg Transdermal Daily  . OxyCODONE  15 mg Oral Q12H  . phenytoin  200 mg Oral BID  . protein supplement  1 scoop Oral BID WC  . saccharomyces boulardii  250 mg Oral BID  . sodium bicarbonate  650 mg Oral TID   Continuous Infusions: . sodium chloride 75 mL/hr at 07/01/14 [phone]

## 2014-07-02 LAB — CBC
HEMATOCRIT: 32.1 % — AB (ref 39.0–52.0)
Hemoglobin: 10.3 g/dL — ABNORMAL LOW (ref 13.0–17.0)
MCH: 29.8 pg (ref 26.0–34.0)
MCHC: 32.1 g/dL (ref 30.0–36.0)
MCV: 92.8 fL (ref 78.0–100.0)
Platelets: 254 10*3/uL (ref 150–400)
RBC: 3.46 MIL/uL — AB (ref 4.22–5.81)
RDW: 18 % — AB (ref 11.5–15.5)
WBC: 6.2 10*3/uL (ref 4.0–10.5)

## 2014-07-02 LAB — BASIC METABOLIC PANEL
Anion gap: 10 (ref 5–15)
BUN: 4 mg/dL — AB (ref 6–23)
CHLORIDE: 102 meq/L (ref 96–112)
CO2: 20 mEq/L (ref 19–32)
Calcium: 7.6 mg/dL — ABNORMAL LOW (ref 8.4–10.5)
Creatinine, Ser: 0.56 mg/dL (ref 0.50–1.35)
GFR calc Af Amer: >90 mL/min (ref >90)
Glucose, Bld: 100 mg/dL — ABNORMAL HIGH (ref 70–99)
Potassium: 4.4 mEq/L (ref 3.7–5.3)
Sodium: 132 mEq/L — ABNORMAL LOW (ref 137–147)

## 2014-07-02 MED ORDER — BUDESONIDE 3 MG PO CP24: 9.0000 mg | ORAL_CAPSULE | Freq: Every day | ORAL | Status: DC

## 2014-07-02 MED ORDER — OXYCODONE HCL 10 MG PO TABS: 10.0000 mg | ORAL_TABLET | Freq: Four times a day (QID) | ORAL | Status: DC | PRN

## 2014-07-02 MED ORDER — SACCHAROMYCES BOULARDII 250 MG PO CAPS: 250.0000 mg | ORAL_CAPSULE | Freq: Two times a day (BID) | ORAL | Status: DC

## 2014-07-02 MED ORDER — ZOLPIDEM TARTRATE 5 MG PO TABS: 5.0000 mg | ORAL_TABLET | Freq: Every evening | ORAL | Status: DC | PRN

## 2014-07-02 MED ORDER — DICYCLOMINE HCL 20 MG PO TABS: 20.0000 mg | ORAL_TABLET | Freq: Four times a day (QID) | ORAL | Status: DC

## 2014-07-02 MED ORDER — OXYCODONE HCL ER 15 MG PO T12A: 15.0000 mg | EXTENDED_RELEASE_TABLET | Freq: Two times a day (BID) | ORAL | Status: DC

## 2014-07-02 MED ORDER — GI COCKTAIL ~~LOC~~: 30.0000 mL | Freq: Three times a day (TID) | ORAL | Status: DC | PRN

## 2014-07-02 MED ORDER — SODIUM BICARBONATE 650 MG PO TABS: 650.0000 mg | ORAL_TABLET | Freq: Every day | ORAL | Status: DC

## 2014-07-02 MED ORDER — FIDAXOMICIN 200 MG PO TABS: 200.0000 mg | ORAL_TABLET | Freq: Two times a day (BID) | ORAL | Status: DC

## 2014-07-02 NOTE — Plan of Care (Signed)
Problem: Phase II Progression Outcomes Goal: Discharge plan established Outcome: Progressing Pt states he would like to go home tommorow

## 2014-07-02 NOTE — Progress Notes (Signed)
The patient's diarrhea is doing much better. He had 2 bowel movements last night, so far none today. It appears that the institution of budesonide is what correlates with his improvement.  We will check the patient on morning rounds tomorrow, but it sounds as though discharge is anticipated unless he has a setback tonight.  Recommendation: Although it is clear he should go home on  budesonide 9 mg each morning, the more difficult consideration is whether he should remain on Fidaxomicin, which was started about 2 weeks ago.   Since his most recent C. difficile PCR was negative and since no pseudomembranes were seen on his recent colonoscopy, I think it would be reasonable to discontinue the fidaxomicin upon discharge, reinstituting it in the event of recurrent diarrhea.   I would probably keep the patient on Saccharomyces probiotic (FloraStor), 2 capsules twice a day for a month following discharge to help prevent C. difficile recurrence.  A GI follow-up appointment with Dr. Teena Irani for 2-3 weeks from now would be appropriate, but I am unable to make such an appointment at this time since it is Sunday.Cleotis Nipper, M.D. [phone]

## 2014-07-02 NOTE — Progress Notes (Signed)
Patient ID: Joshua Reed, male   DOB: [birth year], 68 y.o.   MRN: [id number]  [facility] PROGRESS NOTE  Joshua Reed NOB:[id number] DOB: 19-Aug-1945 DOA: 06/13/2014 PCP: Gennette Pac, MD  Brief narrative:    68 y.o. year old male with COPD, ETOH abuse, seizure disorder, recent R distal radius and right hip fracture s/p fall presented with persistent watery diarrhea since discharge. Patient noted to have been recently discharged from short-term rehabilitation (11/09 - 11/17). Last colonoscopy w/ [facility] last year that was normal per pt.   Patient's hospital course is complicated with ongoing perfuse diarrhea. Patient is currently on fidoxamicin started no 06/20/2014.  Assessment/Plan:   Principal Problem: Severe C diff Diarrhea / leukocytosis - initially treated with oral vancomycin and IV Flagyl, both ABX started 11/16 and now d/c per ID rec's - started on fidoxamicin 12/01 - Biopsies show lymphocytic colitis, per GI, start Budesonide 9 mg/day now and to continue as an outpt - pt report 2 episodes of diarrhea over the past 24 hours  - appreciate GI team recommendations Active Problems: Anion gap metabolic acidosis - likely secondary to above - Initially resolved but acidosis evident again 06/22/2014  - improving with IVF and bicarb  - continue sodium bicarbonate for now  - Follow-up BMP in morning Hyponatremia - most likely secondary to the GI losses, diarrhea - improving and WNL this AM Hypokalemia / hyperkalemia - Potassium initially supplemented and with resulting hyperkalemia. Potassium was 5.4 (12/03) - will supplement as needed  ETOH use - currently denies - CIWA  COPD  - stable  - no resp distress/cough/wheezing Right distal radius/R hip fx  - 6 x 4 cm postoperative right gluteal hematoma. - Not a concern in the setting of stable hemoglobin Protein calorie malnutrition, severe - Nutrition consultation obtained  DVT  prophylaxis: - Heparin SQ   Code Status: Full.  Family Communication: plan of care discussed with the patient and his wife at the bedside Disposition Plan: Home in AM  IV access:   Peripheral IV Procedures and diagnostic studies:    Dg Abd 2 Views 06/22/2014 Nonobstructive bowel gas pattern. Medical Consultants:   Infectious disease (Dr. Carlyle Basques)   GI IAnti-Infectives:    Flagyl IV 11/26 --> 12/01  Vancomycin PO 11/26 --> 12/01  Fidaxomicin 12/01 -->   Faye Ramsay, MD  [facility] Pager [phone]  If 7PM-7AM, please contact night-coverage [email or web address] Password [facility] 07/02/2014, 12:54 PM   LOS: 19 days   HPI/Subjective: No events overnight.   Objective: Filed Vitals:   07/01/14 0542 07/01/14 1500 07/01/14 2139 07/02/14 0537  BP: 105/60 110/69 114/64 117/63  Pulse: 64 68 67 69  Temp: 98 F (36.7 C) 97.9 F (36.6 C) 98 F (36.7 C) 98.1 F (36.7 C)  TempSrc: Oral Oral Oral Oral  Resp: 16 18 18 18   Height:      Weight:      SpO2: 96% 97% 100% 97%    Intake/Output Summary (Last 24 hours) at 07/02/14 1254 Last data filed at 07/02/14 0941  Gross per 24 hour  Intake    860 ml  Output   1050 ml  Net   -190 ml    Exam:   General:  Pt is alert, follows commands appropriately, not in acute distress  Cardiovascular: Regular rate and rhythm, S1/S2, no murmurs, no rubs, no gallops  Respiratory: Clear to auscultation bilaterally, no wheezing, no crackles, no rhonchi  Abdomen: Soft, non tender, non distended, bowel sounds present, no guarding  Extremities: No edema, pulses DP and PT palpable bilaterally  Neuro: Grossly nonfocal  Data Reviewed: Basic Metabolic Panel:  Recent Labs Lab 06/28/14 0530 06/29/14 0410 06/30/14 0440 07/01/14 0615 07/02/14 0540  NA 132* 134* 133* 137 132*  K 3.8 4.5 3.9 3.6* 4.4  CL 104 106 104 108 102  CO2 17* 19 19 19 20   GLUCOSE 100* 95 98 85 100*  BUN 7 5* 5* 5*  4*  CREATININE 0.78 0.69 0.61 0.57 0.56  CALCIUM 8.4 7.9* 8.0* 7.4* 7.6*   Liver Function Tests:  Recent Labs Lab 06/26/14 0542  AST 48*  ALT 34  ALKPHOS 168*  BILITOT 0.9  PROT 6.3  ALBUMIN 2.8*   No results for input(s): LIPASE, AMYLASE in the last 168 hours. No results for input(s): AMMONIA in the last 168 hours. CBC:  Recent Labs Lab 06/28/14 0530 06/29/14 0410 06/30/14 0440 07/01/14 0615 07/02/14 0540  WBC 8.8 6.7 7.1 6.7 6.2  HGB 12.5* 11.4* 11.4* 10.7* 10.3*  HCT 38.4* 36.1* 35.1* 33.2* 32.1*  MCV 93.7 93.8 91.6 92.2 92.8  PLT 291 269 270 266 254   Cardiac Enzymes: No results for input(s): CKTOTAL, CKMB, CKMBINDEX, TROPONINI in the last 168 hours. BNP: Invalid input(s): POCBNP CBG: No results for input(s): GLUCAP in the last 168 hours.  Recent Results (from the past 240 hour(s))  Clostridium Difficile by PCR     Status: None   Collection Time: 06/25/14 12:04 PM  Result Value Ref Range Status   C difficile by pcr NEGATIVE NEGATIVE Final    Comment: Performed at [facility]     Scheduled Meds: . aspirin  325 mg Oral BID  . budesonide  9 mg Oral Daily  . cholecalciferol  1,000 Units Oral Daily  . dicyclomine  20 mg Oral QID  . fidaxomicin  200 mg Oral BID  . heparin  5,000 Units Subcutaneous 3 times per day  . lipase/protease/amylase  12,000 Units Oral TID WC  . nicotine  21 mg Transdermal Daily  . OxyCODONE  15 mg Oral Q12H  . phenytoin  200 mg Oral BID  . protein supplement  1 scoop Oral BID WC  . saccharomyces boulardii  250 mg Oral BID  . sodium bicarbonate  650 mg Oral TID   Continuous Infusions: . sodium chloride 75 mL/hr at 07/01/14 0700

## 2014-07-03 ENCOUNTER — Inpatient Hospital Stay: Payer: Self-pay | Admitting: Physical Medicine & Rehabilitation

## 2014-07-03 LAB — BASIC METABOLIC PANEL WITH GFR
Anion gap: 9 (ref 5–15)
BUN: 7 mg/dL (ref 6–23)
CO2: 25 meq/L (ref 19–32)
Calcium: 7.8 mg/dL — ABNORMAL LOW (ref 8.4–10.5)
Chloride: 100 meq/L (ref 96–112)
Creatinine, Ser: 0.59 mg/dL (ref 0.50–1.35)
GFR calc Af Amer: >90 mL/min
GFR calc non Af Amer: >90 mL/min
Glucose, Bld: 80 mg/dL (ref 70–99)
Potassium: 4.2 meq/L (ref 3.7–5.3)
Sodium: 134 meq/L — ABNORMAL LOW (ref 137–147)

## 2014-07-03 LAB — CBC
HCT: 34.6 % — ABNORMAL LOW (ref 39.0–52.0)
Hemoglobin: 11.4 g/dL — ABNORMAL LOW (ref 13.0–17.0)
MCH: 30.2 pg (ref 26.0–34.0)
MCHC: 32.9 g/dL (ref 30.0–36.0)
MCV: 91.5 fL (ref 78.0–100.0)
Platelets: 278 K/uL (ref 150–400)
RBC: 3.78 MIL/uL — ABNORMAL LOW (ref 4.22–5.81)
RDW: 17.9 % — ABNORMAL HIGH (ref 11.5–15.5)
WBC: 8.6 K/uL (ref 4.0–10.5)

## 2014-07-03 MED ORDER — FIDAXOMICIN 200 MG PO TABS: 200.0000 mg | ORAL_TABLET | Freq: Two times a day (BID) | ORAL | Status: DC

## 2014-07-03 NOTE — Discharge Summary (Signed)
Physician Discharge Summary  Joshua Reed:[id number] DOB: 1946/06/22 DOA: 06/13/2014  PCP: Gennette Pac, MD  Admit date: 06/13/2014 Discharge date: 07/03/2014  Recommendations for Outpatient Follow-up:  1. Pt will need to follow up with PCP in 2-3 weeks post discharge 2. Please obtain BMP to evaluate electrolytes and kidney function 3. Please also check CBC to evaluate Hg and Hct levels 4. Pt started on Budesonide for lymphocytic colitis 5. Sodium bicarb can be discontinued once diarrhea is resolved and bicarb level is stable   Discharge Diagnoses:  Active Problems:   Seizure disorder   COPD (chronic obstructive pulmonary disease)   Alcohol abuse   Vitamin D deficiency   Macular degeneration   Distal radius fracture, right   Intertrochanteric fracture of right hip   C. difficile colitis   Metabolic acidosis   Protein-calorie malnutrition, severe   Poor dentition   Diarrhea   Hyperkalemia   Screening for STD (sexually transmitted disease)   Dehydration   Weakness   Generalized abdominal pain    Discharge Condition: Stable  Diet recommendation: Heart healthy diet discussed in details    Brief narrative:    68 y.o. year old male with COPD, ETOH abuse, seizure disorder, recent R distal radius and right hip fracture s/p fall presented with persistent watery diarrhea since discharge. Patient noted to have been recently discharged from short-term rehabilitation (11/09 - 11/17). Last colonoscopy w/ [facility] last year that was normal per pt.   Patient's hospital course is complicated with ongoing perfuse diarrhea. Patient is currently on fidoxamicin started no 06/20/2014.  Assessment/Plan:   Principal Problem: Severe C diff Diarrhea / leukocytosis - initially treated with oral vancomycin and IV Flagyl, both ABX started 11/16 and now d/c per ID rec's - started on fidoxamicin 12/01 - Biopsies show lymphocytic colitis, per GI, start  Budesonide 9 mg/day now and to continue as an outpt - pt report 2 episodes of diarrhea over the past 24 hours  - appreciate GI team recommendations Active Problems: Anion gap metabolic acidosis - likely secondary to above - Initially resolved but acidosis evident again 06/22/2014  - resolved  - continue sodium bicarbonate for now  Hyponatremia - most likely secondary to the GI losses, diarrhea - improving and WNL this AM Hypokalemia / hyperkalemia - Potassium initially supplemented and with resulting hyperkalemia. Potassium was 5.4 (12/03) - WNL this AM ETOH use - currently denies COPD  - stable  - no resp distress/cough/wheezing Right distal radius/R hip fx  - 6 x 4 cm postoperative right gluteal hematoma. - Not a concern in the setting of stable hemoglobin Protein calorie malnutrition, severe - Nutrition consultation obtained  Code Status: Full.  Family Communication: plan of care discussed with the patient and his wife at the bedside Disposition Plan: Home   IV access:   Peripheral IV Procedures and diagnostic studies:    Dg Abd 2 Views 06/22/2014 Nonobstructive bowel gas pattern. Medical Consultants:   Infectious disease (Dr. Carlyle Basques)   GI IAnti-Infectives:    Flagyl IV 11/26 --> 12/01  Vancomycin PO 11/26 --> 12/01  Fidaxomicin 12/01 --> 3 more doses  Discharge Exam: Filed Vitals:   07/03/14 0519  BP: 101/53  Pulse: 77  Temp: 98.2 F (36.8 C)  Resp: 18   Filed Vitals:   07/01/14 2139 07/02/14 0537 07/02/14 2133 07/03/14 0519  BP: 114/64 117/63 105/65 101/53  Pulse: 67 69 71 77  Temp: 98 F (36.7 C) 98.1 F (36.7 C) 98.2 F (36.8 C)  98.2 F (36.8 C)  TempSrc: Oral Oral Oral Oral  Resp: 18 18 18 18   Height:      Weight:      SpO2: 100% 97% 99% 95%    General: Pt is alert, follows commands appropriately, not in acute distress Cardiovascular: Regular rate and rhythm, S1/S2 +, no  murmurs, no rubs, no gallops Respiratory: Clear to auscultation bilaterally, no wheezing, no crackles, no rhonchi Abdominal: Soft, non tender, non distended, bowel sounds +, no guarding Extremities: no edema, no cyanosis, pulses palpable bilaterally DP and PT Neuro: Grossly nonfocal  Discharge Instructions  Discharge Instructions    Diet - low sodium heart healthy    Complete by:  As directed      Increase activity slowly    Complete by:  As directed             Medication List    STOP taking these medications        famotidine 10 MG tablet  Commonly known as:  PEPCID     methocarbamol 500 MG tablet  Commonly known as:  ROBAXIN      TAKE these medications        acetaminophen 325 MG tablet  Commonly known as:  TYLENOL  Take 1-2 tablets (325-650 mg total) by mouth every 4 (four) hours as needed for mild pain.     aspirin 325 MG tablet  Take 325 mg by mouth 2 (two) times daily.     budesonide 3 MG 24 hr capsule  Commonly known as:  ENTOCORT EC  Take 3 capsules (9 mg total) by mouth daily.     cholecalciferol 1000 UNITS tablet  Commonly known as:  VITAMIN D  Take 1,000 Units by mouth daily.     CO Q 10 PO  Take 1 tablet by mouth daily.     dicyclomine 20 MG tablet  Commonly known as:  BENTYL  Take 1 tablet (20 mg total) by mouth 4 (four) times daily.     ferrous sulfate 325 (65 FE) MG tablet  Take 1 tablet (325 mg total) by mouth 2 (two) times daily with a meal.     fidaxomicin 200 MG Tabs tablet  Commonly known as:  DIFICID  Take 1 tablet (200 mg total) by mouth 2 (two) times daily.     FISH OIL PO  Take 1 capsule by mouth daily.     gi cocktail Susp suspension  Take 30 mLs by mouth 3 (three) times daily as needed for indigestion. Each 30 mL contains 12 mL Maalox, 12 mL Viscous Lidocaine, 6 ml Donnatal     multivitamin with minerals Tabs tablet  Take 1 tablet by mouth daily.     nicotine 21 mg/24hr patch  Commonly known as:  NICODERM CQ - dosed in  mg/24 hours  Place 1 patch (21 mg total) onto the skin daily.     OxyCODONE 15 mg T12a 12 hr tablet  Commonly known as:  OXYCONTIN  Take 1 tablet (15 mg total) by mouth every 12 (twelve) hours. One pill twice a day for 10 days. Then decrease to once a day till gone.     Oxycodone HCl 10 MG Tabs  Take 1-1.5 tablets (10-15 mg total) by mouth every 6 (six) hours as needed.     phenytoin 100 MG ER capsule  Commonly known as:  DILANTIN  Take 200 mg by mouth 2 (two) times daily.     protein supplement Powd  Take 6 g  by mouth 3 (three) times daily with meals.     saccharomyces boulardii 250 MG capsule  Commonly known as:  FLORASTOR  Take 1 capsule (250 mg total) by mouth 2 (two) times daily.     sodium bicarbonate 650 MG tablet  Take 1 tablet (650 mg total) by mouth daily.     tiotropium 18 MCG inhalation capsule  Commonly known as:  SPIRIVA  Place 1 capsule (18 mcg total) into inhaler and inhale daily.     zolpidem 5 MG tablet  Commonly known as:  AMBIEN  Take 1 tablet (5 mg total) by mouth at bedtime as needed for sleep.           Follow-up Information    Follow up with Gennette Pac, MD.   Specialty:  Family Medicine   Contact information:   [address] [place] [address] [phone]        The results of significant diagnostics from this hospitalization (including imaging, microbiology, ancillary and laboratory) are listed below for reference.     Microbiology: Recent Results (from the past 240 hour(s))  Clostridium Difficile by PCR     Status: None   Collection Time: 06/25/14 12:04 PM  Result Value Ref Range Status   C difficile by pcr NEGATIVE NEGATIVE Final    Comment: Performed at [facility]: Basic Metabolic Panel:  Recent Labs Lab 06/29/14 0410 06/30/14 0440 07/01/14 0615 07/02/14 0540 07/03/14 0511  NA 134* 133* 137 132* 134*  K 4.5 3.9 3.6* 4.4 4.2  CL 106 104 108 102 100  CO2 19 19 19 20 25   GLUCOSE 95 98  85 100* 80  BUN 5* 5* 5* 4* 7  CREATININE 0.69 0.61 0.57 0.56 0.59  CALCIUM 7.9* 8.0* 7.4* 7.6* 7.8*   Liver Function Tests: No results for input(s): AST, ALT, ALKPHOS, BILITOT, PROT, ALBUMIN in the last 168 hours. No results for input(s): LIPASE, AMYLASE in the last 168 hours. No results for input(s): AMMONIA in the last 168 hours. CBC:  Recent Labs Lab 06/29/14 0410 06/30/14 0440 07/01/14 0615 07/02/14 0540 07/03/14 0511  WBC 6.7 7.1 6.7 6.2 8.6  HGB 11.4* 11.4* 10.7* 10.3* 11.4*  HCT 36.1* 35.1* 33.2* 32.1* 34.6*  MCV 93.8 91.6 92.2 92.8 91.5  PLT 269 270 266 254 278     SIGNED: Time coordinating discharge: Over 30 minutes  Faye Ramsay, MD  [facility] 07/03/2014, 10:19 AM Pager [phone]  If 7PM-7AM, please contact night-coverage [email or web address] Password [email or web address]

## 2014-07-03 NOTE — Progress Notes (Signed)
Nursing Discharge Summary  Patient ID: Joshua Reed MRN: [id number] DOB/AGE: [birth year] 68 y.o.  Admit date: 06/13/2014 Discharge date: 07/03/2014  Discharged Condition: good  Disposition: 06-Home-Health Care Svc  Follow-up Information    Follow up with Gennette Pac, MD.   Specialty:  Family Medicine   Contact information:   [place] [address] [phone]       Prescriptions Given: Mutliple prescriptions given to patient and wife. Patient and wife both verbalized understanding of discharge instructions. Patient follow up appointments and medications discussed.  Means of Discharge: patient to be taken downstairs via wheelchair to be discharge dhome.   Signed: Buel Ream 07/03/2014, 11:45 AM

## 2014-07-03 NOTE — Progress Notes (Signed)
[facility] PROGRESS NOTE Subjective patient doing much better on the Dificid. Pathology from recent endoscopic procedure showed lymphocytic colitis. Patient currently on florist store, Dificid, and budesonide. He is doing much much better.  Objective: Vital signs in last 24 hours: Temp:  [98.2 F (36.8 C)] 98.2 F (36.8 C) (12/14 0519) Pulse Rate:  [71-77] 77 (12/14 0519) Resp:  [18] 18 (12/14 0519) BP: (101-105)/(53-65) 101/53 mmHg (12/14 0519) SpO2:  [95 %-99 %] 95 % (12/14 0519) Last BM Date: 07/02/14  Intake/Output from previous day: 12/13 0701 - 12/14 0700 In: 3800 [P.O.:1040; I.V.:2760] Out: 300 [Urine:300] Intake/Output this shift:    PE: General--alert no acute distress.  Abdomen-- soft nontender nondistended  Lab Results:  Recent Labs  07/01/14 0615 07/02/14 0540 07/03/14 0511  WBC 6.7 6.2 8.6  HGB 10.7* 10.3* 11.4*  HCT 33.2* 32.1* 34.6*  PLT 266 254 278   BMET  Recent Labs  07/01/14 0615 07/02/14 0540 07/03/14 0511  NA 137 132* 134*  K 3.6* 4.4 4.2  CL 108 102 100  CO2 19 20 25   CREATININE 0.57 0.56 0.59   LFT No results for input(s): PROT, AST, ALT, ALKPHOS, BILITOT, BILIDIR, IBILI in the last 72 hours. PT/INR No results for input(s): LABPROT, INR in the last 72 hours. PANCREAS No results for input(s): LIPASE in the last 72 hours.       Studies/Results: No results found.  Medications: I have reviewed the patient's current medications.  Assessment/Plan: 1. Diarrhea. This appears to be due to C. difficile as well as lymphocytic colitis. The patient is currently being treated for both of these conditions. I think he can be discharged on a full 10 day course of Dificid. He apparently is Artie had some of this in the hospital. I have suggested to his wife that he continue florastor for a month after discharge. I would also continue him on Entocort. Would have him follow-up in the future with Dr. Amedeo Plenty in about 2 to 3  weeks.   Kippy Melena JR,Martie L 07/03/2014, 9:44 AM

## 2014-07-17 ENCOUNTER — Telehealth: Payer: Self-pay | Admitting: *Deleted

## 2014-07-17 NOTE — Telephone Encounter (Signed)
Dilantin level 3.9.  Pt taking generic dilantin 200mg  po bid.  No seizures.   (this taken from Dr. Eddie Dibbles office).  Faxed dover copy of results.

## 2014-07-17 NOTE — Telephone Encounter (Signed)
I spoke to Joshua Reed again with Joshua Reed office.   After looking more, I could not find where pt was seen here in the last 3 yrs.  I spoke to wife and made appt for her on 07-24-14 with Joshua Reed as a NP Seizures. (low dilantin).  Wife to speak to Joshua Reed at Joshua Reed office and f/u there until seen here.

## 2014-07-24 ENCOUNTER — Ambulatory Visit (INDEPENDENT_AMBULATORY_CARE_PROVIDER_SITE_OTHER): Payer: Commercial Managed Care - HMO | Admitting: Diagnostic Neuroimaging

## 2014-07-24 ENCOUNTER — Encounter: Payer: Self-pay | Admitting: Diagnostic Neuroimaging

## 2014-07-24 VITALS — BP 100/61 | HR 70 | Temp 96.9°F | Ht 68.0 in | Wt 128.0 lb

## 2014-07-24 DIAGNOSIS — R569 Unspecified convulsions: Secondary | ICD-10-CM

## 2014-07-24 DIAGNOSIS — R561 Post traumatic seizures: Secondary | ICD-10-CM

## 2014-07-24 MED ORDER — LEVETIRACETAM 750 MG PO TABS: 750.0000 mg | ORAL_TABLET | Freq: Two times a day (BID) | ORAL | Status: DC

## 2014-07-24 NOTE — Patient Instructions (Signed)
Month 1: Start levetiracetam 750mg  twice a day. Change phenytoin to 300mg  in AM and 300mg  in PM.   Month 2: Continue levetiracetam 750mg  twice a day. Change phenytoin to 200mg  in AM and 300mg  in PM.  Month 3: Continue levetiracetam 750mg  twice a day. Change phenytoin to 100mg  in AM and 300mg  in PM.  Month 4: Continue levetiracetam 750mg  twice a day. Change phenytoin to 300mg  in PM.  Month 5: Continue levetiracetam 750mg  twice a day. Change phenytoin to 200mg  in PM.  Month 6: Continue levetiracetam 750mg  twice a day. Change phenytoin to 100mg  in PM.  Month 7: Continue levetiracetam 750mg  twice a day. STOP phenytoin.
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
## 2014-07-28 ENCOUNTER — Encounter: Payer: Self-pay | Admitting: Neurology

## 2014-09-20 ENCOUNTER — Other Ambulatory Visit: Payer: Self-pay | Admitting: Diagnostic Neuroimaging

## 2014-09-20 DIAGNOSIS — R569 Unspecified convulsions: Secondary | ICD-10-CM

## 2014-10-23 ENCOUNTER — Encounter: Payer: Self-pay | Admitting: Diagnostic Neuroimaging

## 2014-10-23 ENCOUNTER — Ambulatory Visit (INDEPENDENT_AMBULATORY_CARE_PROVIDER_SITE_OTHER): Payer: Commercial Managed Care - HMO | Admitting: Diagnostic Neuroimaging

## 2014-10-23 VITALS — BP 116/72 | HR 66 | Ht 68.0 in | Wt 133.4 lb

## 2014-10-23 DIAGNOSIS — R561 Post traumatic seizures: Secondary | ICD-10-CM

## 2014-10-23 DIAGNOSIS — R569 Unspecified convulsions: Secondary | ICD-10-CM | POA: Diagnosis not present

## 2014-10-23 MED ORDER — LEVETIRACETAM 750 MG PO TABS: 750.0000 mg | ORAL_TABLET | Freq: Two times a day (BID) | ORAL | Status: DC

## 2014-10-23 NOTE — Patient Instructions (Signed)
Continue current plan.
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
## 2014-10-24 ENCOUNTER — Ambulatory Visit: Payer: Commercial Managed Care - HMO | Admitting: Diagnostic Neuroimaging

## 2014-12-25 ENCOUNTER — Other Ambulatory Visit: Payer: Self-pay | Admitting: Diagnostic Neuroimaging

## 2014-12-25 NOTE — Telephone Encounter (Signed)
Last OV note says: April 2016: Continue levetiracetam 750mg  twice a day. Change phenytoin to 300mg  in PM. May 2016: Continue levetiracetam 750mg  twice a day. Change phenytoin to 200mg  in PM. June 2016: Continue levetiracetam 750mg  twice a day. Change phenytoin to 100mg  in PM. July 2016: Continue levetiracetam 750mg  twice a day. STOP phenytoin.

## 2015-03-29 ENCOUNTER — Other Ambulatory Visit: Payer: Self-pay | Admitting: Gastroenterology

## 2015-12-29 ENCOUNTER — Other Ambulatory Visit: Payer: Self-pay | Admitting: Diagnostic Neuroimaging

## 2016-03-04 ENCOUNTER — Ambulatory Visit (INDEPENDENT_AMBULATORY_CARE_PROVIDER_SITE_OTHER): Payer: Commercial Managed Care - HMO | Admitting: Diagnostic Neuroimaging

## 2016-03-04 ENCOUNTER — Encounter: Payer: Self-pay | Admitting: Diagnostic Neuroimaging

## 2016-03-04 VITALS — BP 112/67 | HR 74 | Ht 68.0 in | Wt 126.2 lb

## 2016-03-04 DIAGNOSIS — R561 Post traumatic seizures: Secondary | ICD-10-CM | POA: Diagnosis not present

## 2016-03-04 DIAGNOSIS — R569 Unspecified convulsions: Secondary | ICD-10-CM

## 2016-03-04 MED ORDER — LEVETIRACETAM 1000 MG PO TABS: 1000.0000 mg | ORAL_TABLET | Freq: Two times a day (BID) | ORAL | 12 refills | Status: DC
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
## 2016-03-04 NOTE — Patient Instructions (Signed)
-   Increase levetiracetam to 1000mg  twice a day - No driving until seizure free x 6 months (last seizure 03/03/16) - reduce caffeine intake - increase physical activity and exercise

## 2016-05-14 ENCOUNTER — Other Ambulatory Visit: Payer: Self-pay | Admitting: Diagnostic Neuroimaging

## 2016-05-14 DIAGNOSIS — R569 Unspecified convulsions: Secondary | ICD-10-CM

## 2016-06-04 ENCOUNTER — Ambulatory Visit: Payer: Commercial Managed Care - HMO | Admitting: Diagnostic Neuroimaging

## 2016-06-17 ENCOUNTER — Ambulatory Visit (INDEPENDENT_AMBULATORY_CARE_PROVIDER_SITE_OTHER): Payer: Commercial Managed Care - HMO | Admitting: Diagnostic Neuroimaging

## 2016-06-17 ENCOUNTER — Encounter: Payer: Self-pay | Admitting: Diagnostic Neuroimaging

## 2016-06-17 VITALS — BP 111/67 | HR 72 | Wt 132.2 lb

## 2016-06-17 DIAGNOSIS — R561 Post traumatic seizures: Secondary | ICD-10-CM | POA: Diagnosis not present

## 2016-06-17 DIAGNOSIS — R569 Unspecified convulsions: Secondary | ICD-10-CM | POA: Diagnosis not present

## 2016-06-17 MED ORDER — LEVETIRACETAM 1000 MG PO TABS: 1000.0000 mg | ORAL_TABLET | Freq: Two times a day (BID) | ORAL | 4 refills | Status: DC
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
## 2016-10-20 ENCOUNTER — Ambulatory Visit (INDEPENDENT_AMBULATORY_CARE_PROVIDER_SITE_OTHER): Payer: PPO | Admitting: Pulmonary Disease

## 2016-10-20 ENCOUNTER — Encounter: Payer: Self-pay | Admitting: Pulmonary Disease

## 2016-10-20 VITALS — BP 114/70 | HR 67 | Ht 68.0 in | Wt 128.6 lb

## 2016-10-20 DIAGNOSIS — Z87891 Personal history of nicotine dependence: Secondary | ICD-10-CM | POA: Insufficient documentation

## 2016-10-20 DIAGNOSIS — J449 Chronic obstructive pulmonary disease, unspecified: Secondary | ICD-10-CM

## 2016-10-20 DIAGNOSIS — Z720 Tobacco use: Secondary | ICD-10-CM | POA: Diagnosis not present

## 2016-10-20 MED ORDER — UMECLIDINIUM-VILANTEROL 62.5-25 MCG/INH IN AEPB
1.0000 | INHALATION_SPRAY | Freq: Every day | RESPIRATORY_TRACT | 0 refills | Status: DC
Start: 2016-10-20 — End: 2017-04-03

## 2016-10-20 NOTE — Addendum Note (Signed)
Addended by: Valerie Salts on: 10/20/2016 04:29 PM   Modules accepted: Orders

## 2016-10-20 NOTE — Assessment & Plan Note (Signed)
You have to quit smoking! Start back on nicotine patch 14 mg per day We will send prescription for nicotine inhaler

## 2016-10-20 NOTE — Patient Instructions (Addendum)
Lung capacity is decreased at 29% Trial of ANORO once daily- call us for prescription of this works  You have to quit smoking! Start back on nicotine patch 14 mg per day We will send prescription for nicotine inhaler

## 2016-10-20 NOTE — Progress Notes (Signed)
   Subjective:    Patient ID: Joshua Reed, male    DOB: 09-Jan-1946, 71 y.o.   MRN: [id number]  HPI    Review of Systems  Constitutional: Negative for fever and unexpected weight change.  HENT: Negative for congestion, dental problem, ear pain, nosebleeds, postnasal drip, rhinorrhea, sinus pressure, sneezing, sore throat and trouble swallowing.   Eyes: Negative for redness and itching.  Respiratory: Negative for cough, chest tightness, shortness of breath and wheezing.   Cardiovascular: Negative for palpitations and leg swelling.  Gastrointestinal: Negative for nausea and vomiting.  Genitourinary: Negative for dysuria.  Musculoskeletal: Negative for joint swelling.  Skin: Negative for rash.  Neurological: Negative for headaches.  Hematological: Does not bruise/bleed easily.  Psychiatric/Behavioral: Negative for dysphoric mood. The patient is not nervous/anxious.        Objective:   Physical Exam        Assessment & Plan:

## 2016-10-20 NOTE — Assessment & Plan Note (Addendum)
Lung capacity is decreased at 29% Trial of ANORO once daily- call us for prescription of this works We will refer him to lung cancer screening program Referral to pulmonary rehabilitation

## 2016-10-20 NOTE — Progress Notes (Signed)
Patient seen in the office today and instructed on use of Anoro.  Patient expressed understanding and demonstrated technique. [facility] 10/20/16

## 2016-10-20 NOTE — Addendum Note (Signed)
Addended by: Valerie Salts on: 10/20/2016 04:19 PM   Modules accepted: Orders
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
## 2016-10-21 MED ORDER — NICOTINE 10 MG IN INHA: 1.0000 | RESPIRATORY_TRACT | 2 refills | Status: DC | PRN

## 2016-10-21 NOTE — Addendum Note (Signed)
Addended by: Valerie Salts on: 10/21/2016 09:38 AM   Modules accepted: Orders

## 2016-10-24 ENCOUNTER — Telehealth: Payer: Self-pay | Admitting: Acute Care

## 2016-10-24 ENCOUNTER — Telehealth: Payer: Self-pay | Admitting: Pulmonary Disease

## 2016-10-24 DIAGNOSIS — J438 Other emphysema: Secondary | ICD-10-CM

## 2016-10-24 DIAGNOSIS — F17210 Nicotine dependence, cigarettes, uncomplicated: Secondary | ICD-10-CM

## 2016-10-24 NOTE — Telephone Encounter (Signed)
[facility] requesting new pulm rehab order to be placed.  This has been ordered.  Nothing further needed.

## 2016-10-27 NOTE — Telephone Encounter (Signed)
Will forward to lung nodule pool.  

## 2016-10-28 NOTE — Telephone Encounter (Signed)
Spoke with pt's wife and scheduled for [facility] 11/12/16 10:30 CT ordered Nothing further needed

## 2016-11-07 ENCOUNTER — Telehealth (HOSPITAL_COMMUNITY): Payer: Self-pay

## 2016-11-12 ENCOUNTER — Ambulatory Visit (INDEPENDENT_AMBULATORY_CARE_PROVIDER_SITE_OTHER): Payer: PPO | Admitting: Acute Care

## 2016-11-12 ENCOUNTER — Encounter: Payer: Self-pay | Admitting: Acute Care

## 2016-11-12 ENCOUNTER — Ambulatory Visit (INDEPENDENT_AMBULATORY_CARE_PROVIDER_SITE_OTHER)
Admission: RE | Admit: 2016-11-12 | Discharge: 2016-11-12 | Disposition: A | Discharge status: Home / Self Care | Payer: PPO | Source: Ambulatory Visit | Attending: Acute Care | Admitting: Acute Care

## 2016-11-12 DIAGNOSIS — Z720 Tobacco use: Secondary | ICD-10-CM

## 2016-11-12 DIAGNOSIS — Z87891 Personal history of nicotine dependence: Secondary | ICD-10-CM

## 2016-11-12 DIAGNOSIS — F17210 Nicotine dependence, cigarettes, uncomplicated: Secondary | ICD-10-CM

## 2016-11-12 NOTE — Progress Notes (Signed)
History of Present Illness Joshua Reed is a 71 y.o. male with current 65 pack year smoker here for lung cancer screening shared decision making visit.   11/12/2016  Shared Decision Making Visit Lung Cancer Screening Program [phone])   Eligibility:  Age 48 y.o.  Pack Years Smoking History Calculation 56 pack years smoking history (# packs/per year x # years smoked)  Recent History of coughing up blood  no  Unexplained weight loss? no ( >Than 15 pounds within the last 6 months )  Prior History Lung / other cancer no (Diagnosis within the last 5 years already requiring surveillance chest CT Scans).  Smoking Status Current Smoker  Former Smokers: Years since quit:NA  Quit Date: NA  Visit Components:  Discussion included one or more decision making aids. yes  Discussion included risk/benefits of screening. yes  Discussion included potential follow up diagnostic testing for abnormal scans. yes  Discussion included meaning and risk of over diagnosis. yes  Discussion included meaning and risk of False Positives. yes  Discussion included meaning of total radiation exposure. yes  Counseling Included:  Importance of adherence to annual lung cancer LDCT screening. yes  Impact of comorbidities on ability to participate in the program. yes  Ability and willingness to under diagnostic treatment. yes  Smoking Cessation Counseling:  Current Smokers:   Discussed importance of smoking cessation. yes  Information about tobacco cessation classes and interventions provided to patient. yes  Patient provided with "ticket" for LDCT Scan. yes  Symptomatic Patient. no  Counseling  Diagnosis Code: Tobacco Use Z72.0  Asymptomatic Patient yes  Counseling (Intermediate counseling: > three minutes counseling) [id number]  Former Smokers:   Discussed the importance of maintaining cigarette abstinence. yes  Diagnosis Code: Personal History of Nicotine Dependence.  V95.638  Information about tobacco cessation classes and interventions provided to patient. Yes  Patient provided with "ticket" for LDCT Scan. yes  Written Order for Lung Cancer Screening with LDCT placed in Epic. Yes (CT Chest Lung Cancer Screening Low Dose W/O CM) [id number] Z12.2-Screening of respiratory organs Z87.891-Personal history of nicotine dependence  I have spent 25 minutes of face to face time with Joshua Reed discussing the risks and benefits of lung cancer screening. We viewed a power point together that explained in detail the above noted topics. We paused at intervals to allow for questions to be asked and answered to ensure understanding.We discussed that the single most powerful action that he can take to decrease his risk of developing lung cancer is to quit smoking. We discussed whether or not he is ready to commit to setting a quit date. We discussed options for tools to aid in quitting smoking including nicotine replacement therapy, non-nicotine medications, support groups, Quit Smart classes, and behavior modification. We discussed that often times setting smaller, more achievable goals, such as eliminating 1 cigarette a day for a week and then 2 cigarettes a day for a week can be helpful in slowly decreasing the number of cigarettes smoked. This allows for a sense of accomplishment as well as providing a clinical benefit. I gave Joshua Reed the " Be Stronger Than Your Excuses" card with contact information for community resources, classes, free nicotine replacement therapy, and access to mobile apps, text messaging, and on-line smoking cessation help. I have also given Joshua Reed my card and contact information in the event he needs to contact me. We discussed the time and location of the scan, and that either Joshua Glassman RN or I  will call with the results within 24-48 hours of receiving them. I have offered the patient  a copy of the power point we viewed  as a  resource in the event they need reinforcement of the concepts we discussed today in the office. The patient verbalized understanding of all of  the above and had no further questions upon leaving the office. They have my contact information in the event they have any further questions.  I spent 4 minutes counseling on smoking cessation and the health risks of continued tobacco abuse.  I explained to the patient that there has been a high incidence of coronary artery disease noted on these exams. I explained that this is a non-gated exam therefore degree or severity cannot be determined. This patient is currently not on statin therapy. He states it has been about 2 years since he has had his cholesterol checked. I have asked the patient to follow-up with their PCP regarding any incidental finding of coronary artery disease and management with diet or medication as their PCP  feels is clinically indicated, and to make sure he gets his cholesterol checked on an annual basis.. The patient verbalized understanding of the above and had no further questions upon completion of the visit.      Magdalen Spatz, NP 11/12/2016        Tests LD Screening CT Chest>> Pending  Past medical hx Past Medical History:  Diagnosis Date  . Alcohol abuse   . COPD (chronic obstructive pulmonary disease) ([place])   . Hyperlipidemia   . Low vitamin B12 level   . Macular degeneration   . Seizure disorder ([place]) 2205   ALCOHOL RELATED  . Vitamin D deficiency      Past surgical hx, Family hx, Social hx all reviewed.  Current Outpatient Prescriptions on File Prior to Visit  Medication Sig  . acetaminophen (TYLENOL) 325 MG tablet Take 1-2 tablets (325-650 mg total) by mouth every 4 (four) hours as needed for mild pain.  . Coenzyme Q10 (CO Q 10 PO) Take 1 tablet by mouth daily.  Marland Kitchen levETIRAcetam (KEPPRA) 1000 MG tablet Take 1 tablet (1,000 mg total) by mouth 2 (two) times daily.  . Multiple Vitamin (MULTIVITAMIN  WITH MINERALS) TABS tablet Take 1 tablet by mouth daily.  . nicotine (NICOTROL) 10 MG inhaler Inhale 1 Cartridge (1 continuous puffing total) into the lungs as needed for smoking cessation.  . Omega-3 Fatty Acids (FISH OIL PO) Take 1 capsule by mouth daily.  Marland Kitchen umeclidinium-vilanterol (ANORO ELLIPTA) 62.5-25 MCG/INH AEPB Inhale 1 puff into the lungs daily.  . Vitamin D, Ergocalciferol, (DRISDOL) 50000 UNITS CAPS capsule Take 50,000 Units by mouth once a week.   No current facility-administered medications on file prior to visit.      Allergies  Allergen Reactions  . Acyclovir And Related Hives    Review Of Systems:  Constitutional:   No  weight loss, night sweats,  Fevers, chills, fatigue, or  lassitude.  HEENT:   No headaches,  Difficulty swallowing,  Tooth/dental problems, or  Sore throat,                No sneezing, itching, ear ache, nasal congestion, post nasal drip,   CV:  No chest pain,  Orthopnea, PND, swelling in lower extremities, anasarca, dizziness, palpitations, syncope.   GI  No heartburn, indigestion, abdominal pain, nausea, vomiting, diarrhea, change in bowel habits, loss of appetite, bloody stools.   Resp: No shortness of breath with exertion or  at rest.  No excess mucus, no productive cough,  No non-productive cough,  No coughing up of blood.  No change in color of mucus.  No wheezing.  No chest wall deformity  Skin: no rash or lesions.  GU: no dysuria, change in color of urine, no urgency or frequency.  No flank pain, no hematuria   MS:  No joint pain or swelling.  No decreased range of motion.  No back pain.  Psych:  No change in mood or affect. No depression or anxiety.  No memory loss.   Vital Signs There were no vitals checked for this SDM counseling visit  Physical Exam:  General- No distress,  A&Ox3 ENT: No sinus tenderness, TM clear, pale nasal mucosa, no oral exudate,no post nasal drip, no LAN Cardiac: S1, S2, regular rate and rhythm, no  murmur Chest: No wheeze/ rales/ dullness; no accessory muscle use, no nasal flaring, no sternal retractions Abd.: Soft Non-tender Ext: No clubbing cyanosis, edema Neuro:  normal strength Skin: No rashes, warm and dry Psych: normal mood and behavior   Assessment/Plan  Tobacco abuse Patient was counseled to quit smoking. He is planning on quitting with use of nicotine inhaler starting the first of the month Benefits of smoking cessation were discussed Risks of continued tobacco abuse were discussed Plan Patient is scheduled for baseline low dose screening CT 11/12/2016    Magdalen Spatz, NP 11/12/2016  11:36 AM

## 2016-11-12 NOTE — Assessment & Plan Note (Signed)
Patient was counseled to quit smoking. He is planning on quitting with use of nicotine inhaler starting the first of the month Benefits of smoking cessation were discussed Risks of continued tobacco abuse were discussed Plan Patient is scheduled for baseline low dose screening CT 11/12/2016

## 2016-11-13 ENCOUNTER — Ambulatory Visit: Payer: Self-pay | Admitting: Adult Health

## 2016-11-17 ENCOUNTER — Other Ambulatory Visit: Payer: Self-pay | Admitting: Acute Care

## 2016-11-17 DIAGNOSIS — F17210 Nicotine dependence, cigarettes, uncomplicated: Secondary | ICD-10-CM

## 2016-12-17 ENCOUNTER — Encounter (INDEPENDENT_AMBULATORY_CARE_PROVIDER_SITE_OTHER): Payer: Self-pay

## 2016-12-17 ENCOUNTER — Ambulatory Visit (INDEPENDENT_AMBULATORY_CARE_PROVIDER_SITE_OTHER): Payer: PPO | Admitting: Diagnostic Neuroimaging

## 2016-12-17 ENCOUNTER — Encounter: Payer: Self-pay | Admitting: Diagnostic Neuroimaging

## 2016-12-17 VITALS — BP 102/62 | HR 65 | Wt 128.2 lb

## 2016-12-17 DIAGNOSIS — R569 Unspecified convulsions: Secondary | ICD-10-CM

## 2016-12-17 DIAGNOSIS — R561 Post traumatic seizures: Secondary | ICD-10-CM | POA: Diagnosis not present

## 2016-12-17 MED ORDER — LEVETIRACETAM 1000 MG PO TABS: 1000.0000 mg | ORAL_TABLET | Freq: Two times a day (BID) | ORAL | 4 refills | Status: DC

## 2016-12-17 NOTE — Patient Instructions (Signed)
-  continue current medications
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
## 2016-12-31 DIAGNOSIS — J449 Chronic obstructive pulmonary disease, unspecified: Secondary | ICD-10-CM | POA: Diagnosis not present

## 2017-01-28 DIAGNOSIS — R5383 Other fatigue: Secondary | ICD-10-CM | POA: Diagnosis not present

## 2017-01-28 DIAGNOSIS — Z125 Encounter for screening for malignant neoplasm of prostate: Secondary | ICD-10-CM | POA: Diagnosis not present

## 2017-01-28 DIAGNOSIS — E538 Deficiency of other specified B group vitamins: Secondary | ICD-10-CM | POA: Diagnosis not present

## 2017-01-28 DIAGNOSIS — J449 Chronic obstructive pulmonary disease, unspecified: Secondary | ICD-10-CM | POA: Diagnosis not present

## 2017-01-28 DIAGNOSIS — R790 Abnormal level of blood mineral: Secondary | ICD-10-CM | POA: Diagnosis not present

## 2017-01-30 ENCOUNTER — Other Ambulatory Visit: Payer: Self-pay

## 2017-01-30 NOTE — Telephone Encounter (Signed)
SENT NOTES TO SCHEDULING 

## 2017-02-06 DIAGNOSIS — D649 Anemia, unspecified: Secondary | ICD-10-CM | POA: Diagnosis not present

## 2017-02-12 DIAGNOSIS — E538 Deficiency of other specified B group vitamins: Secondary | ICD-10-CM | POA: Diagnosis not present

## 2017-02-26 DIAGNOSIS — Z23 Encounter for immunization: Secondary | ICD-10-CM | POA: Diagnosis not present

## 2017-03-11 DIAGNOSIS — E538 Deficiency of other specified B group vitamins: Secondary | ICD-10-CM | POA: Diagnosis not present

## 2017-03-13 ENCOUNTER — Encounter: Payer: Self-pay | Admitting: Interventional Cardiology

## 2017-04-03 ENCOUNTER — Encounter: Payer: Self-pay | Admitting: Interventional Cardiology

## 2017-04-03 ENCOUNTER — Ambulatory Visit (INDEPENDENT_AMBULATORY_CARE_PROVIDER_SITE_OTHER): Payer: PPO | Admitting: Interventional Cardiology

## 2017-04-03 ENCOUNTER — Encounter (INDEPENDENT_AMBULATORY_CARE_PROVIDER_SITE_OTHER): Payer: Self-pay

## 2017-04-03 VITALS — BP 110/66 | HR 64 | Ht 68.0 in | Wt 126.2 lb

## 2017-04-03 DIAGNOSIS — T733XXA Exhaustion due to excessive exertion, initial encounter: Secondary | ICD-10-CM

## 2017-04-03 DIAGNOSIS — F1010 Alcohol abuse, uncomplicated: Secondary | ICD-10-CM | POA: Diagnosis not present

## 2017-04-03 DIAGNOSIS — R0609 Other forms of dyspnea: Secondary | ICD-10-CM | POA: Diagnosis not present

## 2017-04-03 DIAGNOSIS — I2510 Atherosclerotic heart disease of native coronary artery without angina pectoris: Secondary | ICD-10-CM

## 2017-04-03 DIAGNOSIS — I2584 Coronary atherosclerosis due to calcified coronary lesion: Secondary | ICD-10-CM | POA: Diagnosis not present

## 2017-04-03 DIAGNOSIS — Z720 Tobacco use: Secondary | ICD-10-CM

## 2017-04-03 DIAGNOSIS — J449 Chronic obstructive pulmonary disease, unspecified: Secondary | ICD-10-CM

## 2017-04-03 DIAGNOSIS — R5383 Other fatigue: Secondary | ICD-10-CM | POA: Insufficient documentation

## 2017-04-03 DIAGNOSIS — E538 Deficiency of other specified B group vitamins: Secondary | ICD-10-CM | POA: Diagnosis not present

## 2017-04-03 NOTE — Progress Notes (Signed)
Cardiology Office Note    Date:  04/03/2017   ID:  PASHA BROAD, DOB [birth year], MRN [id number]  PCP:  Hulan Fess, MD  Cardiologist: Sinclair Grooms, MD   Chief Complaint  Patient presents with  . Shortness of Breath  . Fatigue    History of Present Illness:  Joshua Reed is a 71 y.o. male referred by Dr. Hulan Fess for evaluation of exertional fatigue. The patient had screening chest CT in April 2018 that revealed left main and LAD calcification. Had prior negative cardiac workup in 2002 with normal coronary angiography.  Patient's complaint is exertional weakness and fatigue. There is no associated tightness in the chest or pain. Rest allows him to recover quickly. He continues to smoke cigarettes. He has a known diagnosis of COPD. He denies wheezing and excessive dyspnea with exertion. There is no orthopnea or PND. 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 He is actively working and on the job earlier this week he was up and down a flight of stairs. He noticed this morning soreness in the lower extremities.  Past Medical History:  Diagnosis Date  . Alcohol abuse   . COPD (chronic obstructive pulmonary disease) ([place])   . Hyperlipidemia   . Low vitamin B12 level   . Macular degeneration   . Seizure disorder ([place]) 2205   ALCOHOL RELATED  . Seizures ([place])   . Vitamin D deficiency     Past Surgical History:  Procedure Laterality Date  . CARDIAC CATHETERIZATION  2002   normal  . FLEXIBLE SIGMOIDOSCOPY N/A 06/28/2014   Procedure: FLEXIBLE SIGMOIDOSCOPY;  Surgeon: Lear Ng, MD;  Location: [facility];  Service: Endoscopy;  Laterality: N/A;  . INTRAMEDULLARY (IM) NAIL INTERTROCHANTERIC Right 05/24/2014   Procedure: INTRAMEDULLARY (IM) NAIL INTERTROCHANTRIC;  Surgeon: Marianna Payment, MD;  Location: [place];  Service: Orthopedics;  Laterality: Right;  . OPEN REDUCTION INTERNAL FIXATION  (ORIF) DISTAL RADIAL FRACTURE Right 05/24/2014   Procedure: OPEN REDUCTION INTERNAL FIXATION (ORIF) DISTAL RADIAL FRACTURE;  Surgeon: Marianna Payment, MD;  Location: [place];  Service: Orthopedics;  Laterality: Right;    Current Medications: Outpatient Medications Prior to Visit  Medication Sig Dispense Refill  . acetaminophen (TYLENOL) 325 MG tablet Take 1-2 tablets (325-650 mg total) by mouth every 4 (four) hours as needed for mild pain.    . Coenzyme Q10 (CO Q 10 PO) Take 1 tablet by mouth daily.    Marland Kitchen levETIRAcetam (KEPPRA) 1000 MG tablet Take 1 tablet (1,000 mg total) by mouth 2 (two) times daily. 180 tablet 4  . Multiple Vitamin (MULTIVITAMIN WITH MINERALS) TABS tablet Take 1 tablet by mouth daily.    . Omega-3 Fatty Acids (FISH OIL PO) Take 1 capsule by mouth daily.    . Vitamin D, Ergocalciferol, (DRISDOL) 50000 UNITS CAPS capsule Take 50,000 Units by mouth once a week.  0  . nicotine (NICOTROL) 10 MG inhaler Inhale 1 Cartridge (1 continuous puffing total) into the lungs as needed for smoking cessation. (Patient not taking: Reported on 04/03/2017) 42 each 2  . umeclidinium-vilanterol (ANORO ELLIPTA) 62.5-25 MCG/INH AEPB Inhale 1 puff into the lungs daily. (Patient not taking: Reported on 04/03/2017) 1 each 0   No facility-administered medications prior to visit.      Allergies:   Acyclovir and related   Social History   Social History  . Marital status: Married    Spouse  name: [name]  . Number of children: 2  . Years of education: college   Occupational History  .  Other    n/a   Social History Main Topics  . Smoking status: Current Every Day Smoker    Packs/day: 0.50    Types: Cigarettes    Last attempt to quit: 05/07/2011  . Smokeless tobacco: Never Used     Comment: 12/17/16  vaping  . Alcohol use 2.4 oz/week    4 Cans of beer per week     Comment: quit 2-3  years ago  . Drug use: No  . Sexual activity: Not Asked   Other Topics Concern  . None   Social History  Narrative   Patient lives at home with spouse.   Caffeine use: a few cups of coffee in the morning     Family History:  The patient's family history includes Alzheimer's disease in his father; Colon cancer in his sister; Heart Problems in his brother; Heart attack (age of onset: 45) in his mother; Rheumatic fever in his mother.   ROS:   Please see the history of present illness.    Adequate appetite but has lost 25 pounds over the last 4 years. This was acutely precipitated by a bout with C. difficile 4 years ago. Has occasional abdominal pain, diarrhea, easy bruising, leg pain,  All other systems reviewed and are negative.   PHYSICAL EXAM:   VS:  BP 110/66   Pulse 64   Ht 5\' 8"  (1.727 m)   Wt 126 lb 3.2 oz (57.2 kg)   BMI 19.19 kg/m    GEN: Frail under nourished appearing, well developed, in no acute distress .  HEENT: normal  Neck: no JVD, carotid bruits, or masses Cardiac: RRR; no murmurs, rubs, or gallops,no edema. Pulses are 2+ and symmetric throughout.   Respiratory:  clear to auscultation bilaterally, normal work of breathing GI: soft, nontender, nondistended, + BS MS: no deformity or atrophy.   Skin: warm and dry, no rash Neuro:  Alert and Oriented x 3, Strength and sensation are intact Psych: euthymic mood, full affect  Wt Readings from Last 3 Encounters:  04/03/17 126 lb 3.2 oz (57.2 kg)  12/17/16 128 lb 3.2 oz (58.2 kg)  10/20/16 128 lb 9.6 oz (58.3 kg)      Studies/Labs Reviewed:   EKG:  EKG  Sinus rhythm, poor wave progression/QS pattern in V1 and V2. Left axis deviation. EKG is unchanged from prior performed in 2015 with exception of heart rate slower on current tracing.  Recent Labs: No results found for requested labs within last 8760 hours.   Lipid Panel No results found for: CHOL, TRIG, HDL, CHOLHDL, VLDL, LDLCALC, LDLDIRECT  Additional studies/ records that were reviewed today include:  CT chest for cancer screening April 2018: LAD and left main  coronary calcification  ECHOCARDIOGRAM 2012:   EF 63%, aortic valve calcification, right heart normal.  ASSESSMENT:    1. Fatigue due to excessive exertion, initial encounter   2. Coronary artery calcification   3. DOE (dyspnea on exertion)   4. Chronic obstructive pulmonary disease, unspecified COPD type ([place])   5. Tobacco abuse   6. Alcohol abuse      PLAN:  1. In order of problems listed above:I doubt this is cardiac. Most likely multifactorial including impact of frailty, COPD, and deconditioning. An exercise treadmill test will be done to gauge exertional tolerance.  2. Exercise treadmill test to exclude ischemia. Should treat LDL cholesterol  71 year old last to prevent acute events. Aspirin 81 mg daily. Consider starting statin therapy. 3. Likely related to COPD. Rule out ischemia equivalent.  4. Managed by Dr. Rex Kras and his primary pulmonologist. Lung capacity was decreased to 29% per Dr. Elsworth Soho (pulmonary specialist). 5. Encourage cessation 6. Did not address. According to wife he has cut way back. Not sure that cardiac disease is inhibiting to fatigue. The exercise treadmill test will help [place] documenting exertional tolerance and rule out ischemia.    Medication Adjustments/Labs and Tests Ordered: Current medicines are reviewed at length with the patient today.  Concerns regarding medicines are outlined above.  Medication changes, Labs and Tests ordered today are listed in the Patient Instructions below. Patient Instructions  Medication Instructions:  None  Labwork: None  Testing/Procedures: Your physician has requested that you have an exercise tolerance test. For further information please visit [email or web address]. Please also follow instruction sheet, as given.   Follow-Up: Your physician recommends that you schedule a follow-up appointment as needed with Dr. Tamala Julian.    Any Other Special Instructions Will Be Listed Below (If Applicable).     If you need a  refill on your cardiac medications before your next appointment, please call your pharmacy.      Signed, Sinclair Grooms, MD  04/03/2017 11:02 AM    [facility] [place], [place], [place]  [address] Phone: [phone]; Fax: [phone]

## 2017-04-03 NOTE — Patient Instructions (Signed)
Medication Instructions:  None  Labwork: None  Testing/Procedures: Your physician has requested that you have an exercise tolerance test. For further information please visit [email or web address]. Please also follow instruction sheet, as given.   Follow-Up: Your physician recommends that you schedule a follow-up appointment as needed with Dr. Tamala Julian.    Any Other Special Instructions Will Be Listed Below (If Applicable).     If you need a refill on your cardiac medications before your next appointment, please call your pharmacy.

## 2017-04-10 DIAGNOSIS — H40013 Open angle with borderline findings, low risk, bilateral: Secondary | ICD-10-CM | POA: Diagnosis not present

## 2017-04-10 DIAGNOSIS — H353112 Nonexudative age-related macular degeneration, right eye, intermediate dry stage: Secondary | ICD-10-CM | POA: Diagnosis not present

## 2017-04-10 DIAGNOSIS — H353121 Nonexudative age-related macular degeneration, left eye, early dry stage: Secondary | ICD-10-CM | POA: Diagnosis not present

## 2017-04-10 DIAGNOSIS — H35033 Hypertensive retinopathy, bilateral: Secondary | ICD-10-CM | POA: Diagnosis not present

## 2017-04-20 DIAGNOSIS — E538 Deficiency of other specified B group vitamins: Secondary | ICD-10-CM | POA: Diagnosis not present

## 2017-05-15 DIAGNOSIS — E538 Deficiency of other specified B group vitamins: Secondary | ICD-10-CM | POA: Diagnosis not present

## 2017-05-15 DIAGNOSIS — J449 Chronic obstructive pulmonary disease, unspecified: Secondary | ICD-10-CM | POA: Diagnosis not present

## 2017-05-15 DIAGNOSIS — K900 Celiac disease: Secondary | ICD-10-CM | POA: Diagnosis not present

## 2017-05-15 DIAGNOSIS — D649 Anemia, unspecified: Secondary | ICD-10-CM | POA: Diagnosis not present

## 2017-05-15 DIAGNOSIS — Z23 Encounter for immunization: Secondary | ICD-10-CM | POA: Diagnosis not present

## 2017-05-20 ENCOUNTER — Ambulatory Visit (INDEPENDENT_AMBULATORY_CARE_PROVIDER_SITE_OTHER): Payer: PPO

## 2017-05-20 ENCOUNTER — Encounter (INDEPENDENT_AMBULATORY_CARE_PROVIDER_SITE_OTHER): Payer: Self-pay

## 2017-05-20 DIAGNOSIS — I2510 Atherosclerotic heart disease of native coronary artery without angina pectoris: Secondary | ICD-10-CM | POA: Diagnosis not present

## 2017-05-20 DIAGNOSIS — R0609 Other forms of dyspnea: Secondary | ICD-10-CM | POA: Diagnosis not present

## 2017-05-20 DIAGNOSIS — I2584 Coronary atherosclerosis due to calcified coronary lesion: Secondary | ICD-10-CM | POA: Diagnosis not present

## 2017-05-20 LAB — EXERCISE TOLERANCE TEST
CHL RATE OF PERCEIVED EXERTION: 15
CSEPED: 6 min
CSEPEDS: 0 s
CSEPEW: 7 METS
CSEPHR: 102 %
CSEPPHR: 153 {beats}/min
MPHR: 150 {beats}/min
Rest HR: 73 {beats}/min

## 2017-06-04 DIAGNOSIS — S60522A Blister (nonthermal) of left hand, initial encounter: Secondary | ICD-10-CM | POA: Diagnosis not present

## 2017-06-09 DIAGNOSIS — E538 Deficiency of other specified B group vitamins: Secondary | ICD-10-CM | POA: Diagnosis not present

## 2017-07-22 DIAGNOSIS — E559 Vitamin D deficiency, unspecified: Secondary | ICD-10-CM | POA: Diagnosis not present

## 2017-07-22 DIAGNOSIS — Z125 Encounter for screening for malignant neoplasm of prostate: Secondary | ICD-10-CM | POA: Diagnosis not present

## 2017-07-22 DIAGNOSIS — E7800 Pure hypercholesterolemia, unspecified: Secondary | ICD-10-CM | POA: Diagnosis not present

## 2017-07-22 DIAGNOSIS — Z0000 Encounter for general adult medical examination without abnormal findings: Secondary | ICD-10-CM | POA: Diagnosis not present

## 2017-07-22 DIAGNOSIS — E538 Deficiency of other specified B group vitamins: Secondary | ICD-10-CM | POA: Diagnosis not present

## 2017-07-22 DIAGNOSIS — K900 Celiac disease: Secondary | ICD-10-CM | POA: Diagnosis not present

## 2017-07-22 DIAGNOSIS — Z87898 Personal history of other specified conditions: Secondary | ICD-10-CM | POA: Diagnosis not present

## 2017-07-22 DIAGNOSIS — J449 Chronic obstructive pulmonary disease, unspecified: Secondary | ICD-10-CM | POA: Diagnosis not present

## 2017-07-22 DIAGNOSIS — Z79899 Other long term (current) drug therapy: Secondary | ICD-10-CM | POA: Diagnosis not present

## 2017-07-22 DIAGNOSIS — F17201 Nicotine dependence, unspecified, in remission: Secondary | ICD-10-CM | POA: Diagnosis not present

## 2017-07-22 DIAGNOSIS — L570 Actinic keratosis: Secondary | ICD-10-CM | POA: Diagnosis not present

## 2017-07-22 DIAGNOSIS — F1020 Alcohol dependence, uncomplicated: Secondary | ICD-10-CM | POA: Diagnosis not present

## 2017-07-27 DIAGNOSIS — D485 Neoplasm of uncertain behavior of skin: Secondary | ICD-10-CM | POA: Diagnosis not present

## 2017-07-27 DIAGNOSIS — C4441 Basal cell carcinoma of skin of scalp and neck: Secondary | ICD-10-CM | POA: Diagnosis not present

## 2017-07-27 DIAGNOSIS — L821 Other seborrheic keratosis: Secondary | ICD-10-CM | POA: Diagnosis not present

## 2017-07-27 DIAGNOSIS — L570 Actinic keratosis: Secondary | ICD-10-CM | POA: Diagnosis not present

## 2017-08-18 DIAGNOSIS — E538 Deficiency of other specified B group vitamins: Secondary | ICD-10-CM | POA: Diagnosis not present

## 2017-09-15 DIAGNOSIS — E538 Deficiency of other specified B group vitamins: Secondary | ICD-10-CM | POA: Diagnosis not present

## 2017-10-16 DIAGNOSIS — E538 Deficiency of other specified B group vitamins: Secondary | ICD-10-CM | POA: Diagnosis not present

## 2017-11-13 ENCOUNTER — Inpatient Hospital Stay: Admission: RE | Admit: 2017-11-13 | Payer: PPO | Source: Ambulatory Visit

## 2017-11-18 DIAGNOSIS — E538 Deficiency of other specified B group vitamins: Secondary | ICD-10-CM | POA: Diagnosis not present

## 2017-11-19 ENCOUNTER — Other Ambulatory Visit: Payer: Self-pay | Admitting: Diagnostic Neuroimaging

## 2017-11-24 ENCOUNTER — Inpatient Hospital Stay: Admission: RE | Admit: 2017-11-24 | Payer: PPO | Source: Ambulatory Visit

## 2017-11-25 ENCOUNTER — Inpatient Hospital Stay: Admission: RE | Admit: 2017-11-25 | Payer: PPO | Source: Ambulatory Visit

## 2017-12-01 ENCOUNTER — Inpatient Hospital Stay: Admission: RE | Admit: 2017-12-01 | Payer: PPO | Source: Ambulatory Visit

## 2017-12-15 ENCOUNTER — Ambulatory Visit: Payer: PPO | Admitting: Diagnostic Neuroimaging

## 2017-12-16 ENCOUNTER — Ambulatory Visit (INDEPENDENT_AMBULATORY_CARE_PROVIDER_SITE_OTHER)
Admission: RE | Admit: 2017-12-16 | Discharge: 2017-12-16 | Disposition: A | Discharge status: Home / Self Care | Payer: PPO | Source: Ambulatory Visit | Attending: Acute Care | Admitting: Acute Care

## 2017-12-16 ENCOUNTER — Other Ambulatory Visit: Payer: Self-pay | Admitting: Diagnostic Neuroimaging

## 2017-12-16 DIAGNOSIS — F17210 Nicotine dependence, cigarettes, uncomplicated: Secondary | ICD-10-CM

## 2017-12-17 DIAGNOSIS — E538 Deficiency of other specified B group vitamins: Secondary | ICD-10-CM | POA: Diagnosis not present

## 2017-12-18 ENCOUNTER — Ambulatory Visit: Payer: PPO | Admitting: Diagnostic Neuroimaging

## 2017-12-23 ENCOUNTER — Other Ambulatory Visit: Payer: Self-pay | Admitting: Acute Care

## 2017-12-23 DIAGNOSIS — F17210 Nicotine dependence, cigarettes, uncomplicated: Secondary | ICD-10-CM

## 2017-12-23 DIAGNOSIS — Z122 Encounter for screening for malignant neoplasm of respiratory organs: Secondary | ICD-10-CM

## 2018-01-19 DIAGNOSIS — E538 Deficiency of other specified B group vitamins: Secondary | ICD-10-CM | POA: Diagnosis not present

## 2018-01-25 DIAGNOSIS — Z85828 Personal history of other malignant neoplasm of skin: Secondary | ICD-10-CM | POA: Diagnosis not present

## 2018-01-25 DIAGNOSIS — L570 Actinic keratosis: Secondary | ICD-10-CM | POA: Diagnosis not present

## 2018-02-15 DIAGNOSIS — E538 Deficiency of other specified B group vitamins: Secondary | ICD-10-CM | POA: Diagnosis not present

## 2018-03-17 DIAGNOSIS — Z23 Encounter for immunization: Secondary | ICD-10-CM | POA: Diagnosis not present

## 2018-04-16 DIAGNOSIS — E538 Deficiency of other specified B group vitamins: Secondary | ICD-10-CM | POA: Diagnosis not present

## 2018-05-05 DIAGNOSIS — H35033 Hypertensive retinopathy, bilateral: Secondary | ICD-10-CM | POA: Diagnosis not present

## 2018-05-05 DIAGNOSIS — H353112 Nonexudative age-related macular degeneration, right eye, intermediate dry stage: Secondary | ICD-10-CM | POA: Diagnosis not present

## 2018-05-05 DIAGNOSIS — H40013 Open angle with borderline findings, low risk, bilateral: Secondary | ICD-10-CM | POA: Diagnosis not present

## 2018-05-05 DIAGNOSIS — H353121 Nonexudative age-related macular degeneration, left eye, early dry stage: Secondary | ICD-10-CM | POA: Diagnosis not present

## 2018-05-24 ENCOUNTER — Encounter

## 2018-05-24 ENCOUNTER — Ambulatory Visit: Payer: PPO | Admitting: Diagnostic Neuroimaging

## 2018-05-31 DIAGNOSIS — E538 Deficiency of other specified B group vitamins: Secondary | ICD-10-CM | POA: Diagnosis not present

## 2018-07-02 DIAGNOSIS — E538 Deficiency of other specified B group vitamins: Secondary | ICD-10-CM | POA: Diagnosis not present

## 2018-07-29 DIAGNOSIS — Z79899 Other long term (current) drug therapy: Secondary | ICD-10-CM | POA: Diagnosis not present

## 2018-07-29 DIAGNOSIS — E7800 Pure hypercholesterolemia, unspecified: Secondary | ICD-10-CM | POA: Diagnosis not present

## 2018-07-29 DIAGNOSIS — Z1389 Encounter for screening for other disorder: Secondary | ICD-10-CM | POA: Diagnosis not present

## 2018-07-29 DIAGNOSIS — Z87898 Personal history of other specified conditions: Secondary | ICD-10-CM | POA: Diagnosis not present

## 2018-07-29 DIAGNOSIS — Z125 Encounter for screening for malignant neoplasm of prostate: Secondary | ICD-10-CM | POA: Diagnosis not present

## 2018-07-29 DIAGNOSIS — F17201 Nicotine dependence, unspecified, in remission: Secondary | ICD-10-CM | POA: Diagnosis not present

## 2018-07-29 DIAGNOSIS — H3530 Unspecified macular degeneration: Secondary | ICD-10-CM | POA: Diagnosis not present

## 2018-07-29 DIAGNOSIS — Z23 Encounter for immunization: Secondary | ICD-10-CM | POA: Diagnosis not present

## 2018-07-29 DIAGNOSIS — K900 Celiac disease: Secondary | ICD-10-CM | POA: Diagnosis not present

## 2018-07-29 DIAGNOSIS — E538 Deficiency of other specified B group vitamins: Secondary | ICD-10-CM | POA: Diagnosis not present

## 2018-07-29 DIAGNOSIS — Z0000 Encounter for general adult medical examination without abnormal findings: Secondary | ICD-10-CM | POA: Diagnosis not present

## 2018-07-29 DIAGNOSIS — J449 Chronic obstructive pulmonary disease, unspecified: Secondary | ICD-10-CM | POA: Diagnosis not present

## 2018-08-09 ENCOUNTER — Telehealth: Payer: Self-pay | Admitting: Pulmonary Disease

## 2018-08-09 NOTE — Telephone Encounter (Signed)
Spoke with pt's wife. States that she received a call from our office but she didn't catch the person's name on the message. I advised her that I do not see where anyone from our office called her. She apologized for any inconvenience. Nothing further was needed.

## 2018-09-01 ENCOUNTER — Ambulatory Visit: Payer: PPO | Admitting: Pulmonary Disease

## 2018-09-14 IMAGING — CT CT CHEST LUNG CANCER SCREENING LOW DOSE W/O CM
1 of 4 series · 10 of 40 positions shown, 13 images · non-contrast
Comparison: 11/12/2016.

CLINICAL DATA: Current smoker, 57 pack-year history.

EXAM:
CT CHEST WITHOUT CONTRAST LOW-DOSE FOR LUNG CANCER SCREENING
TECHNIQUE: Multidetector CT imaging of the chest was performed following the
standard protocol without IV contrast.

[ct lung segmentation data · axial · 0.71mm/px · z∈[-354,-354]mm · 10 of 339 frames shown]
[frame 1/339  mediastinal]
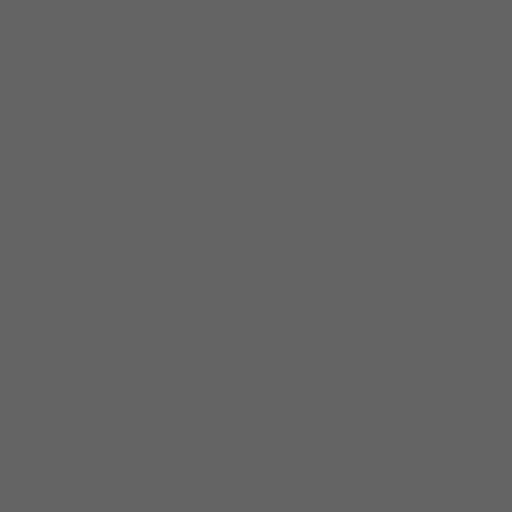
[frame 1/339  lung]
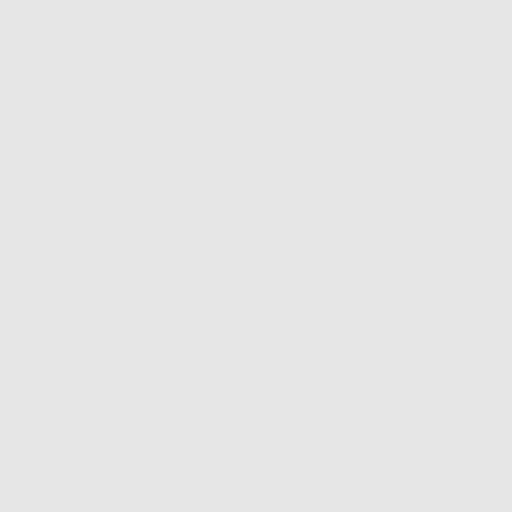
[frame 38/339  lung]
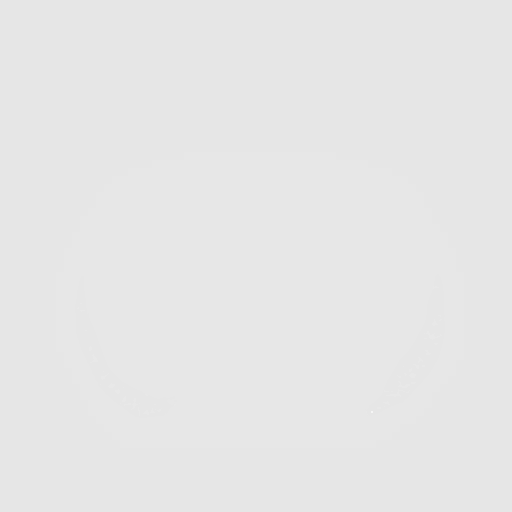
[frame 76/339  lung]
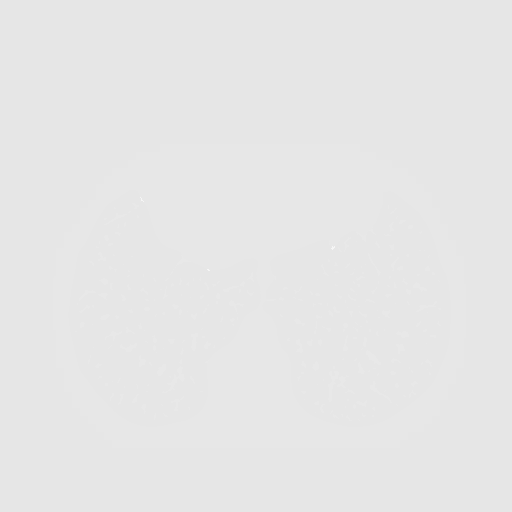
[frame 113/339  lung]
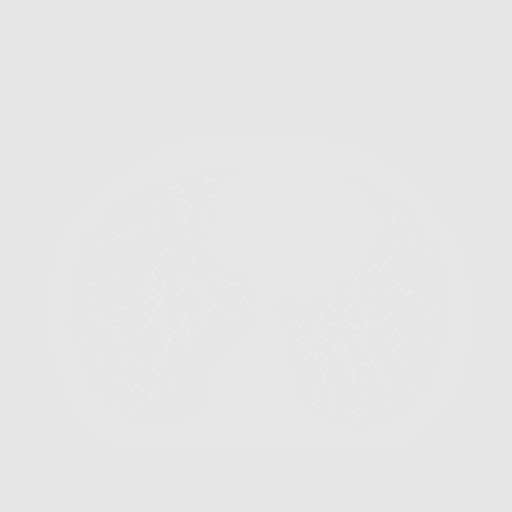
[frame 151/339  mediastinal]
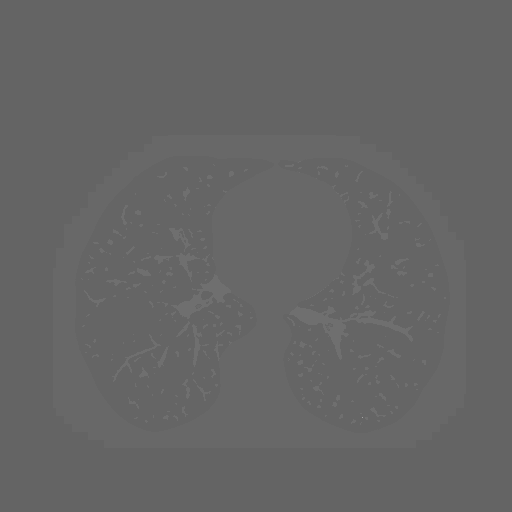
[frame 151/339  lung]
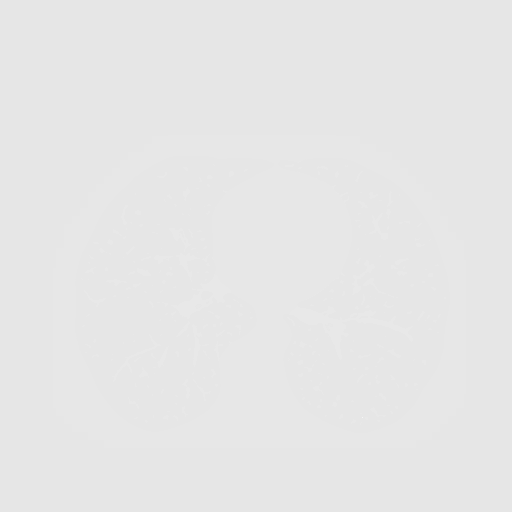
[frame 188/339  lung]
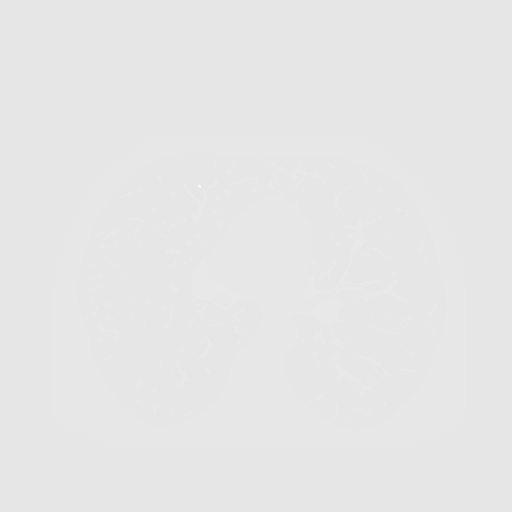
[frame 226/339  lung]
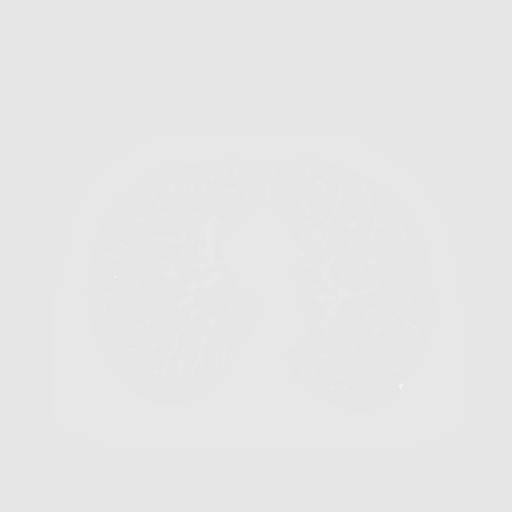
[frame 263/339  lung]
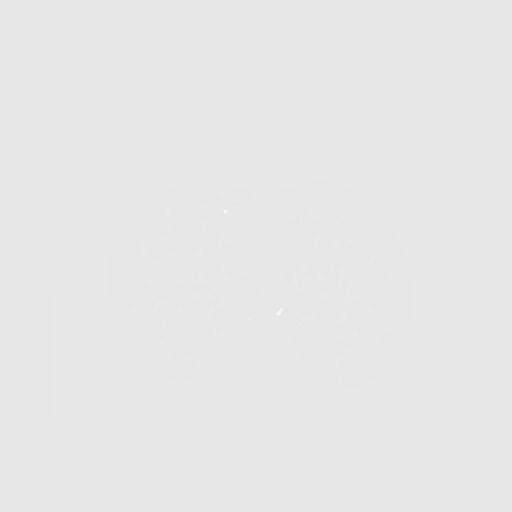
[frame 301/339  mediastinal]
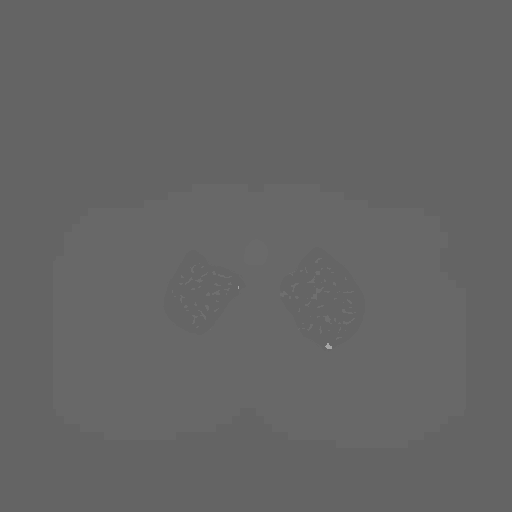
[frame 301/339  lung]
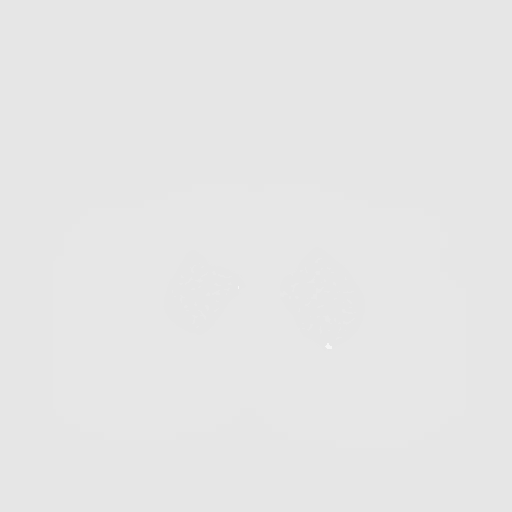
[frame 339/339  lung]
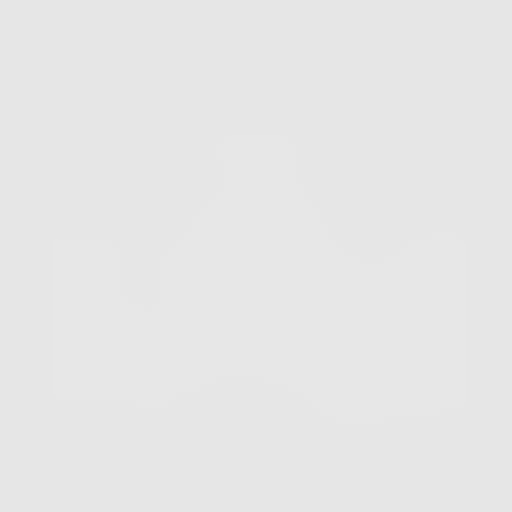

[10 of 40 positions shown; findings below may reference images not displayed]

FINDINGS: Cardiovascular: Atherosclerotic calcification of the arterial
vasculature, including coronary arteries. Heart size normal. No
pericardial effusion.

Mediastinum/Nodes: No pathologically enlarged mediastinal or
axillary lymph nodes. Hilar regions are difficult to evaluate
without IV contrast but appear grossly unremarkable. Esophagus is
grossly unremarkable.

Lungs/Pleura: Mild biapical pleuroparenchymal scarring. Moderate
centrilobular emphysema. Calcified granuloma in the right lower
lobe. No new pulmonary nodules. No pleural fluid. Airway is
unremarkable.

Upper Abdomen: Visualized portions of the liver, gallbladder adrenal
glands are grossly unremarkable. There may be punctate stones in the
kidneys. Visualized portions of the spleen, pancreas, stomach and
bowel are grossly unremarkable. No upper abdominal adenopathy.

Musculoskeletal: Degenerative changes in the spine. No worrisome
lytic or sclerotic lesions.
IMPRESSION: Lung-RADS 1, negative.

1. Lung-RADS 1, negative. Continue annual screening with low-dose
chest CT without contrast in 12 months.
2. Aortic atherosclerosis (EINP2-170.0). Coronary artery
calcification.
3.  Emphysema (EINP2-J[DATE] be punctate stones in the kidneys.

## 2019-01-11 ENCOUNTER — Other Ambulatory Visit: Payer: Self-pay | Admitting: *Deleted

## 2019-01-11 DIAGNOSIS — Z87891 Personal history of nicotine dependence: Secondary | ICD-10-CM

## 2019-01-11 DIAGNOSIS — Z122 Encounter for screening for malignant neoplasm of respiratory organs: Secondary | ICD-10-CM

## 2019-01-11 DIAGNOSIS — F17210 Nicotine dependence, cigarettes, uncomplicated: Secondary | ICD-10-CM

## 2019-02-18 ENCOUNTER — Ambulatory Visit (INDEPENDENT_AMBULATORY_CARE_PROVIDER_SITE_OTHER)
Admission: RE | Admit: 2019-02-18 | Discharge: 2019-02-18 | Disposition: A | Discharge status: Home / Self Care | Payer: Medicare Other | Source: Ambulatory Visit | Attending: Acute Care | Admitting: Acute Care

## 2019-02-18 ENCOUNTER — Other Ambulatory Visit: Payer: Self-pay

## 2019-02-18 DIAGNOSIS — F17210 Nicotine dependence, cigarettes, uncomplicated: Secondary | ICD-10-CM | POA: Diagnosis not present

## 2019-02-18 DIAGNOSIS — Z87891 Personal history of nicotine dependence: Secondary | ICD-10-CM | POA: Diagnosis not present

## 2019-02-18 DIAGNOSIS — Z122 Encounter for screening for malignant neoplasm of respiratory organs: Secondary | ICD-10-CM

## 2019-02-19 ENCOUNTER — Other Ambulatory Visit: Payer: Self-pay | Admitting: Diagnostic Neuroimaging

## 2019-02-28 ENCOUNTER — Other Ambulatory Visit: Payer: Self-pay | Admitting: *Deleted

## 2019-02-28 DIAGNOSIS — Z122 Encounter for screening for malignant neoplasm of respiratory organs: Secondary | ICD-10-CM

## 2019-02-28 DIAGNOSIS — Z87891 Personal history of nicotine dependence: Secondary | ICD-10-CM

## 2019-02-28 DIAGNOSIS — F17210 Nicotine dependence, cigarettes, uncomplicated: Secondary | ICD-10-CM

## 2019-03-03 ENCOUNTER — Inpatient Hospital Stay: Admission: RE | Admit: 2019-03-03 | Payer: PPO | Source: Ambulatory Visit

## 2019-03-31 ENCOUNTER — Telehealth: Payer: Self-pay | Admitting: Diagnostic Neuroimaging

## 2019-03-31 ENCOUNTER — Other Ambulatory Visit: Payer: Self-pay | Admitting: Diagnostic Neuroimaging

## 2019-03-31 MED ORDER — LEVETIRACETAM 500 MG PO TABS: ORAL_TABLET | ORAL | 0 refills | Status: DC

## 2019-03-31 NOTE — Telephone Encounter (Signed)
Levetiracetam refilled x 1 month with note to pharmacy: patient must keep 04/19/19 follow up.

## 2019-03-31 NOTE — Addendum Note (Signed)
Addended by: Minna Antis on: 03/31/2019 02:23 PM   Modules accepted: Orders

## 2019-03-31 NOTE — Telephone Encounter (Signed)
Pt wife has called to inform they are going out of town for 2 weeks and pt does not have enough levETIRAcetam (KEPPRA) 500 MG tablet for the time that they are gone. They are going out of town on tomorrow.  Wife is asking the refill be sent to  [facility] [phone].  Pt annual f/u has been scheduled for 09-29

## 2019-04-19 ENCOUNTER — Ambulatory Visit: Payer: PPO | Admitting: Family Medicine

## 2019-04-19 DIAGNOSIS — R561 Post traumatic seizures: Secondary | ICD-10-CM | POA: Insufficient documentation
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
## 2019-04-20 ENCOUNTER — Ambulatory Visit (INDEPENDENT_AMBULATORY_CARE_PROVIDER_SITE_OTHER): Payer: Medicare Other | Admitting: Family Medicine

## 2019-04-20 ENCOUNTER — Encounter: Payer: Self-pay | Admitting: Family Medicine

## 2019-04-20 ENCOUNTER — Other Ambulatory Visit: Payer: Self-pay

## 2019-04-20 VITALS — BP 119/65 | HR 62 | Temp 97.5°F | Ht 68.0 in | Wt 127.6 lb

## 2019-04-20 DIAGNOSIS — G40909 Epilepsy, unspecified, not intractable, without status epilepticus: Secondary | ICD-10-CM | POA: Diagnosis not present

## 2019-04-20 MED ORDER — LEVETIRACETAM 1000 MG PO TABS: 1000.0000 mg | ORAL_TABLET | Freq: Two times a day (BID) | ORAL | 3 refills | Status: DC
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
## 2019-04-20 NOTE — Patient Instructions (Addendum)
We will continue Keppra 1000mg  twice daily (we have changed tablet to 1000mg  tablet so that you only have to take 1 tablet twice daily)  You may return to PCP for refills as seizures are stable and no dose changes needed.    Seizure, Adult A seizure is a sudden burst of abnormal electrical activity in the brain. Seizures usually last from 30 seconds to 2 minutes. They can cause many different symptoms. Usually, seizures are not harmful unless they last a long time. What are the causes? Common causes of this condition include:  Fever or infection.  Conditions that affect the brain, such as: ? A brain abnormality that you were born with. ? A brain or head injury. ? Bleeding in the brain. ? A tumor. ? Stroke. ? Brain disorders such as autism or cerebral palsy.  Low blood sugar.  Conditions that are passed from parent to child (are inherited).  Problems with substances, such as: ? Having a reaction to a drug or a medicine. ? Suddenly stopping the use of a substance (withdrawal). In some cases, the cause may not be known. A person who has repeated seizures over time without a clear cause has a condition called epilepsy. What increases the risk? You are more likely to get this condition if you have:  A family history of epilepsy.  Had a seizure in the past.  A brain disorder.  A history of head injury, lack of oxygen at birth, or strokes. What are the signs or symptoms? There are many types of seizures. The symptoms vary depending on the type of seizure you have. Examples of symptoms during a seizure include:  Shaking (convulsions).  Stiffness in the body.  Passing out (losing consciousness).  Head nodding.  Staring.  Not responding to sound or touch.  Loss of bladder control and bowel control. Some people have symptoms right before and right after a seizure happens. Symptoms before a seizure may include:  Fear.  Worry (anxiety).  Feeling like you may vomit  (nauseous).  Feeling like the room is spinning (vertigo).  Feeling like you saw or heard something before (dj vu).  Odd tastes or smells.  Changes in how you see. You may see flashing lights or spots. Symptoms after a seizure happens can include:  Confusion.  Sleepiness.  Headache.  Weakness on one side of the body. How is this treated? Most seizures will stop on their own in under 5 minutes. In these cases, no treatment is needed. Seizures that last longer than 5 minutes will usually need treatment. Treatment can include:  Medicines given through an IV tube.  Avoiding things that are known to cause your seizures. These can include medicines that you take for another condition.  Medicines to treat epilepsy.  Surgery to stop the seizures. This may be needed if medicines do not help. Follow these instructions at home: Medicines  Take over-the-counter and prescription medicines only as told by your doctor.  Do not eat or drink anything that may keep your medicine from working, such as alcohol. Activity  Do not do any activities that would be dangerous if you had another seizure, like driving or swimming. Wait until your doctor says it is safe for you to do them.  If you live in the [place] ask your local DMV (department of motor vehicles) when you can drive.  Get plenty of rest. Teaching others Teach friends and family what to do when you have a seizure. They should:  Lay you on  the ground.  Protect your head and body.  Loosen any tight clothing around your neck.  Turn you on your side.  Not hold you down.  Not put anything into your mouth.  Know whether or not you need emergency care.  Stay with you until you are better.  General instructions  Contact your doctor each time you have a seizure.  Avoid anything that gives you seizures.  Keep a seizure diary. Write down: ? What you think caused each seizure. ? What you remember about each seizure.  Keep  all follow-up visits as told by your doctor. This is important. Contact a doctor if:  You have another seizure.  You have seizures more often.  There is any change in what happens during your seizures.  You keep having seizures with treatment.  You have symptoms of being sick or having an infection. Get help right away if:  You have a seizure that: ? Lasts longer than 5 minutes. ? Is different than seizures you had before. ? Makes it harder to breathe. ? Happens after you hurt your head.  You have any of these symptoms after a seizure: ? Not being able to speak. ? Not being able to use a part of your body. ? Confusion. ? A bad headache.  You have two or more seizures in a row.  You do not wake up right after a seizure.  You get hurt during a seizure. These symptoms may be an emergency. Do not wait to see if the symptoms will go away. Get medical help right away. Call your local emergency services (911 in the [place] Do not drive yourself to the hospital. Summary  Seizures usually last from 30 seconds to 2 minutes. Usually, they are not harmful unless they last a long time.  Do not eat or drink anything that may keep your medicine from working, such as alcohol.  Teach friends and family what to do when you have a seizure.  Contact your doctor each time you have a seizure. This information is not intended to replace advice given to you by your health care provider. Make sure you discuss any questions you have with your health care provider. Document Released: 12/24/2007 Document Revised: 09/24/2018 Document Reviewed: 09/24/2018 Elsevier Patient Education  Joshua Reed.

## 2019-04-25 ENCOUNTER — Encounter: Payer: Self-pay | Admitting: Family Medicine

## 2019-04-28 ENCOUNTER — Other Ambulatory Visit: Payer: Self-pay | Admitting: Diagnostic Neuroimaging

## 2019-05-16 NOTE — Progress Notes (Signed)
I reviewed note and agree with plan.   Penni Bombard, MD [id number], [id number] AM Certified in Neurology, Neurophysiology and Neuroimaging  [facility] [address] [place], [place] [address] [phone]

## 2019-06-07 ENCOUNTER — Ambulatory Visit: Payer: PPO | Admitting: Diagnostic Neuroimaging

## 2019-09-25 ENCOUNTER — Ambulatory Visit: Payer: Medicare Other | Attending: Internal Medicine

## 2019-09-25 DIAGNOSIS — Z23 Encounter for immunization: Secondary | ICD-10-CM | POA: Insufficient documentation

## 2019-09-25 NOTE — Progress Notes (Signed)
   Covid-19 Vaccination Clinic  Name:  Joshua Reed    MRN: [id number] DOB: 09-08-1945  09/25/2019  [name] was observed post Covid-19 immunization for 15 minutes without incident. He was provided with Vaccine Information Sheet and instruction to access the V-Safe system.   [name] was instructed to call 911 with any severe reactions post vaccine: Marland Kitchen Difficulty breathing  . Swelling of face and throat  . A fast heartbeat  . A bad rash all over body  . Dizziness and weakness   Immunizations Administered    Name Date Dose VIS Date Route   Pfizer COVID-19 Vaccine 09/25/2019  8:17 AM 0.3 mL 07/01/2019 Intramuscular   Manufacturer: [employer]   Lot: [id number]   [place]: [id number]

## 2019-10-06 NOTE — Progress Notes (Signed)
Cardiology Office Note:    Date:  10/07/2019   ID:  Joshua Reed, DOB 1945-10-08, MRN [id number]  PCP:  Hulan Fess, MD  Cardiologist:  No primary care provider on file.   Referring MD: Hulan Fess, MD   Chief Complaint  Patient presents with  . Coronary Artery Disease    History of Present Illness:    Joshua Reed is a 74 y.o. male with a hx of exertional fatigue, tobacco abuse, hyperlipidemia, screening chest CT in April 2018 and 2020 that revealed left main and LAD calcification. Had prior negative cardiac workup in 2002 with normal coronary angiography.  Negative stress test 2019.  He is asymptomatic.  Relatively active.  Still smoking cigarettes.  Has no limitations in physical endurance although states that he is relatively sedentary.  Denies chest pain.  He has not felt "indigestion" or any other sensation in the chest that causes concern.  He denies leg discomfort with ambulation.  He has not had palpitations, syncope, or transient neurological symptoms.  He takes aspirin 162 mg/day.   Past Medical History:  Diagnosis Date  . Alcohol abuse   . COPD (chronic obstructive pulmonary disease) ([place])   . Hyperlipidemia   . Low vitamin B12 level   . Macular degeneration   . Seizure disorder ([place]) 2205   ALCOHOL RELATED  . Seizures ([place])   . Vitamin D deficiency     Past Surgical History:  Procedure Laterality Date  . CARDIAC CATHETERIZATION  2002   normal  . FLEXIBLE SIGMOIDOSCOPY N/A 06/28/2014   Procedure: FLEXIBLE SIGMOIDOSCOPY;  Surgeon: Lear Ng, MD;  Location: [facility];  Service: Endoscopy;  Laterality: N/A;  . INTRAMEDULLARY (IM) NAIL INTERTROCHANTERIC Right 05/24/2014   Procedure: INTRAMEDULLARY (IM) NAIL INTERTROCHANTRIC;  Surgeon: Marianna Payment, MD;  Location: [place];  Service: Orthopedics;  Laterality: Right;  . OPEN REDUCTION INTERNAL FIXATION (ORIF) DISTAL RADIAL FRACTURE Right 05/24/2014   Procedure: OPEN REDUCTION INTERNAL  FIXATION (ORIF) DISTAL RADIAL FRACTURE;  Surgeon: Marianna Payment, MD;  Location: [place];  Service: Orthopedics;  Laterality: Right;    Current Medications: Current Meds  Medication Sig  . acetaminophen (TYLENOL) 325 MG tablet Take 1-2 tablets (325-650 mg total) by mouth every 4 (four) hours as needed for mild pain.  . Budesonide-Formoterol Fumarate (SYMBICORT IN) Takes 2 puffs into the lungs twice daily as needed for shortness of breath  . Coenzyme Q10 (CO Q 10 PO) Take 1 tablet by mouth daily.  . Cyanocobalamin (B-12 IJ) Takes injection as directed bi weekly  . ferrous sulfate 325 (65 FE) MG tablet Take 325 mg by mouth 3 (three) times daily with meals.  . levETIRAcetam (KEPPRA) 1000 MG tablet Take 1 tablet (1,000 mg total) by mouth 2 (two) times daily.  . Multiple Vitamin (MULTIVITAMIN WITH MINERALS) TABS tablet Take 1 tablet by mouth daily.  . Omega-3 Fatty Acids (FISH OIL PO) Take 1 capsule by mouth daily.  . Vitamin D, Ergocalciferol, (DRISDOL) 50000 UNITS CAPS capsule Take 50,000 Units by mouth once a week.     Allergies:   Acyclovir and related   Social History   Socioeconomic History  . Marital status: Married    Spouse name: [name]  . Number of children: 2  . Years of education: college  . Highest education level: Not on file  Occupational History    Employer: OTHER    Comment: n/a  Tobacco Use  . Smoking status: Current Every Day Smoker    Packs/day:  0.50    Types: Cigarettes    Last attempt to quit: 05/07/2011    Years since quitting: 8.4  . Smokeless tobacco: Never Used  . Tobacco comment: 12/17/16  vaping  Substance and Sexual Activity  . Alcohol use: Yes    Alcohol/week: 4.0 standard drinks    Types: 4 Cans of beer per week    Comment: quit 2-3  years ago  . Drug use: No  . Sexual activity: Not on file  Other Topics Concern  . Not on file  Social History Narrative   Patient lives at home with spouse.   Caffeine use: a few cups of coffee in the morning    Social Determinants of Health   Financial Resource Strain:   . Difficulty of Paying Living Expenses:   Food Insecurity:   . Worried About Charity fundraiser in the Last Year:   . Arboriculturist in the Last Year:   Transportation Needs:   . Film/video editor (Medical):   Marland Kitchen Lack of Transportation (Non-Medical):   Physical Activity:   . Days of Exercise per Week:   . Minutes of Exercise per Session:   Stress:   . Feeling of Stress :   Social Connections:   . Frequency of Communication with Friends and Family:   . Frequency of Social Gatherings with Friends and Family:   . Attends Religious Services:   . Active Member of Clubs or Organizations:   . Attends Archivist Meetings:   Marland Kitchen Marital Status:      Family History: The patient's family history includes Alzheimer's disease in his father; Colon cancer in his sister; Heart Problems in his brother; Heart attack (age of onset: 46) in his mother; Rheumatic fever in his mother.  ROS:   Please see the history of present illness.    No particular complaints.  Trying to stop smoking.  All other systems reviewed and are negative.  EKGs/Labs/Other Studies Reviewed:    The following studies were reviewed today: Exercise treadmill test October 2019: Study Highlights    Blood pressure demonstrated a hypertensive response to exercise.  No T wave inversion was noted during stress.  There was no ST segment deviation noted during stress.  Overall, the patient's exercise capacity was normal.  [facility] Treadmill Score: low risk   Negative stress test without evidence of ischemia at given workload.   CT chest lung cancer screening 01/2019: IMPRESSION: 1. Lung-RADS 1, negative. Continue annual screening with low-dose chest CT without contrast in 12 months. 2. Aortic atherosclerosis (ICD10-I70.0), coronary artery atherosclerosis and emphysema (ICD10-J43.9).     EKG:  EKG the EKG demonstrates normal sinus rhythm,  leftward axis, poor R wave progression possibly consistent with prior anteroseptal infarct.  No change when compared to the prior tracing performed in September 2019  Recent Labs: No results found for requested labs within last 8760 hours.  Recent Lipid Panel No results found for: CHOL, TRIG, HDL, CHOLHDL, VLDL, LDLCALC, LDLDIRECT  Physical Exam:    VS:  BP 128/68   Pulse 67   Ht 5\' 8"  (1.727 m)   Wt 131 lb 1.9 oz (59.5 kg)   SpO2 95%   BMI 19.94 kg/m     Wt Readings from Last 3 Encounters:  10/07/19 131 lb 1.9 oz (59.5 kg)  04/20/19 127 lb 9.6 oz (57.9 kg)  04/03/17 126 lb 3.2 oz (57.2 kg)     GEN: Frail appearing although vigorous in his daily life activities.Marland Kitchen  No acute distress HEENT: Normal NECK: No JVD. LYMPHATICS: No lymphadenopathy CARDIAC:  RRR without murmur, gallop, or edema. VASCULAR:  Normal Pulses. No bruits. RESPIRATORY:  Clear to auscultation without rales, wheezing or rhonchi  ABDOMEN: Soft, non-tender, non-distended, No pulsatile mass, MUSCULOSKELETAL: No deformity  SKIN: Warm and dry NEUROLOGIC:  Alert and oriented x 3 PSYCHIATRIC:  Normal affect   ASSESSMENT:    1. Calcification of coronary artery   2. Tobacco abuse   3. Chronic obstructive pulmonary disease, unspecified COPD type ([place])   4. Other hyperlipidemia   5. Educated about COVID-19 virus infection    PLAN:    In order of problems listed above:  1. Asymptomatic coronary atherosclerosis.  Primary prevention discussed.  Risk factor modification that needs to occur is smoking cessation, increase in physical activity to achieve at least 150 minutes of moderate activity, and LDL cholesterol less than 70. 2. Discontinue cigarette smoking if possible.  He is unable to commit. 3. Not discussed specifically other than requested to stop smoking cigarettes. 4. Hyperlipidemia with most recent LDL 118.  Needs to be less than 70.  Start rosuvastatin 10 mg/day.  Liver and lipid panel in 6 to 8 weeks  after starting rosuvastatin. 5. COVID-19 vaccine has been received.  3 Debbie's is advocated and practiced.  Rosuvastatin/increase activity/stop smoking.   Medication Adjustments/Labs and Tests Ordered: Current medicines are reviewed at length with the patient today.  Concerns regarding medicines are outlined above.  Orders Placed This Encounter  Procedures  . Lipid panel  . Hepatic function panel  . EKG 12-Lead   Meds ordered this encounter  Medications  . rosuvastatin (CRESTOR) 10 MG tablet    Sig: Take 1 tablet (10 mg total) by mouth daily.    Dispense:  90 tablet    Refill:  3    Patient Instructions  Medication Instructions:  1) START Rosuvastatin 10mg  once daily  *If you need a refill on your cardiac medications before your next appointment, please call your pharmacy*   Lab Work: Liver and Lipid panel in 6-8 weeks.  You will need to be fasting for these labs (nothing to eat or drink after midnight except water and black coffee).  If you have labs (blood work) drawn today and your tests are completely normal, you will receive your results only by: Marland Kitchen MyChart Message (if you have MyChart) OR . A paper copy in the mail If you have any lab test that is abnormal or we need to change your treatment, we will call you to review the results.   Testing/Procedures: None   Follow-Up: At [facility], you and your health needs are our priority.  As part of our continuing mission to provide you with exceptional heart care, we have created designated Provider Care Teams.  These Care Teams include your primary Cardiologist (physician) and Advanced Practice Providers (APPs -  Physician Assistants and Nurse Practitioners) who all work together to provide you with the care you need, when you need it.  We recommend signing up for the patient portal called "MyChart".  Sign up information is provided on this After Visit Summary.  MyChart is used to connect with patients for Virtual  Visits (Telemedicine).  Patients are able to view lab/test results, encounter notes, upcoming appointments, etc.  Non-urgent messages can be sent to your provider as well.   To learn more about what you can do with MyChart, go to [email or web address].    Your next appointment:   12 month(s)  The format for your next appointment:   In Person  Provider:   You may see Dr. Daneen Schick or one of the following Advanced Practice Providers on your designated Care Team:    Truitt Merle, NP  Cecilie Kicks, NP  Kathyrn Drown, NP    Other Instructions      Signed, Sinclair Grooms, MD  10/07/2019 1:42 PM    [place]

## 2019-10-07 ENCOUNTER — Encounter: Payer: Self-pay | Admitting: Interventional Cardiology

## 2019-10-07 ENCOUNTER — Ambulatory Visit: Payer: Medicare Other | Admitting: Interventional Cardiology

## 2019-10-07 ENCOUNTER — Other Ambulatory Visit: Payer: Self-pay

## 2019-10-07 VITALS — BP 128/68 | HR 67 | Ht 68.0 in | Wt 131.1 lb

## 2019-10-07 DIAGNOSIS — I2584 Coronary atherosclerosis due to calcified coronary lesion: Secondary | ICD-10-CM

## 2019-10-07 DIAGNOSIS — Z720 Tobacco use: Secondary | ICD-10-CM

## 2019-10-07 DIAGNOSIS — I2510 Atherosclerotic heart disease of native coronary artery without angina pectoris: Secondary | ICD-10-CM

## 2019-10-07 DIAGNOSIS — J449 Chronic obstructive pulmonary disease, unspecified: Secondary | ICD-10-CM

## 2019-10-07 DIAGNOSIS — E7849 Other hyperlipidemia: Secondary | ICD-10-CM

## 2019-10-07 DIAGNOSIS — Z7189 Other specified counseling: Secondary | ICD-10-CM

## 2019-10-07 MED ORDER — ROSUVASTATIN CALCIUM 10 MG PO TABS: 10.0000 mg | ORAL_TABLET | Freq: Every day | ORAL | 3 refills | Status: DC

## 2019-10-07 NOTE — Patient Instructions (Signed)
Medication Instructions:  1) START Rosuvastatin 10mg  once daily  *If you need a refill on your cardiac medications before your next appointment, please call your pharmacy*   Lab Work: Liver and Lipid panel in 6-8 weeks.  You will need to be fasting for these labs (nothing to eat or drink after midnight except water and black coffee).  If you have labs (blood work) drawn today and your tests are completely normal, you will receive your results only by: Marland Kitchen MyChart Message (if you have MyChart) OR . A paper copy in the mail If you have any lab test that is abnormal or we need to change your treatment, we will call you to review the results.   Testing/Procedures: None   Follow-Up: At [facility], you and your health needs are our priority.  As part of our continuing mission to provide you with exceptional heart care, we have created designated Provider Care Teams.  These Care Teams include your primary Cardiologist (physician) and Advanced Practice Providers (APPs -  Physician Assistants and Nurse Practitioners) who all work together to provide you with the care you need, when you need it.  We recommend signing up for the patient portal called "MyChart".  Sign up information is provided on this After Visit Summary.  MyChart is used to connect with patients for Virtual Visits (Telemedicine).  Patients are able to view lab/test results, encounter notes, upcoming appointments, etc.  Non-urgent messages can be sent to your provider as well.   To learn more about what you can do with MyChart, go to [email or web address].    Your next appointment:   12 month(s)  The format for your next appointment:   In Person  Provider:   You may see Dr. Daneen Schick or one of the following Advanced Practice Providers on your designated Care Team:    Truitt Merle, NP  Cecilie Kicks, NP  Kathyrn Drown, NP    Other Instructions

## 2019-10-19 ENCOUNTER — Ambulatory Visit: Payer: Medicare Other | Attending: Internal Medicine

## 2019-10-19 DIAGNOSIS — Z23 Encounter for immunization: Secondary | ICD-10-CM

## 2019-10-19 NOTE — Progress Notes (Signed)
   Covid-19 Vaccination Clinic  Name:  Joshua Reed    MRN: [id number] DOB: 1946-06-14  10/19/2019  Joshua Reed was observed post Covid-19 immunization for 15 minutes without incident. He was provided with Vaccine Information Sheet and instruction to access the V-Safe system.   Joshua Reed was instructed to call 911 with any severe reactions post vaccine: Marland Kitchen Difficulty breathing  . Swelling of face and throat  . A fast heartbeat  . A bad rash all over body  . Dizziness and weakness   Immunizations Administered    Name Date Dose VIS Date Route   Pfizer COVID-19 Vaccine 10/19/2019 12:39 PM 0.3 mL 07/01/2019 Intramuscular   Manufacturer: [place]   Lot: [id number]   [place]: [id number]

## 2019-10-21 ENCOUNTER — Telehealth: Payer: Self-pay | Admitting: Interventional Cardiology

## 2019-10-21 NOTE — Telephone Encounter (Signed)
Wife of the patient called. She would like to ask Dr. Thompson Caul Nurse or APP to answer some questions in regards to a procedure that was discussed at the patient's last appointment.   The wife was not able to come in with the patient, so there are a lot of things that didn't get discussed. Please contact the wife

## 2019-10-21 NOTE — Telephone Encounter (Signed)
Called patient's wife (DPR) back. She wanted to know where patient's calcification was at in his heart. Informed her that per Dr. Thompson Caul office note on 10/07/19, "left main and LAD calcification". Patient's wife wanted to know more about cholesterol. Informed her that LDL (bad cholesterol) should be 70 and below as a rule for patient's with heart disease. Patient's wife thanked [place] for the call, but would like to do more research and talk to Dr. Thompson Caul nurse on Monday when she is back in the office. Will forward to Dr. Thompson Caul nurse so she is aware.

## 2019-10-24 NOTE — Telephone Encounter (Signed)
Left message to call back  

## 2019-10-25 ENCOUNTER — Ambulatory Visit: Payer: Medicare Other

## 2019-10-28 ENCOUNTER — Ambulatory Visit: Payer: Medicare Other | Admitting: Interventional Cardiology

## 2019-11-23 ENCOUNTER — Other Ambulatory Visit: Payer: Medicare Other

## 2020-02-06 ENCOUNTER — Telehealth: Payer: Self-pay | Admitting: Interventional Cardiology

## 2020-02-06 NOTE — Telephone Encounter (Signed)
Patient is requesting to switch providers from Dr. Tamala Julian to Dr. Harrell Gave.

## 2020-02-06 NOTE — Telephone Encounter (Signed)
Ok by me

## 2020-02-08 NOTE — Telephone Encounter (Signed)
Absolutely fine with me

## 2020-03-01 ENCOUNTER — Inpatient Hospital Stay: Admission: RE | Admit: 2020-03-01 | Payer: Medicare Other | Source: Ambulatory Visit

## 2020-03-05 ENCOUNTER — Inpatient Hospital Stay: Admission: RE | Admit: 2020-03-05 | Payer: Medicare Other | Source: Ambulatory Visit

## 2020-03-07 ENCOUNTER — Ambulatory Visit (INDEPENDENT_AMBULATORY_CARE_PROVIDER_SITE_OTHER)
Admission: RE | Admit: 2020-03-07 | Discharge: 2020-03-07 | Disposition: A | Discharge status: Home / Self Care | Payer: Medicare Other | Source: Ambulatory Visit | Attending: Acute Care | Admitting: Acute Care

## 2020-03-07 ENCOUNTER — Other Ambulatory Visit: Payer: Self-pay

## 2020-03-07 DIAGNOSIS — Z122 Encounter for screening for malignant neoplasm of respiratory organs: Secondary | ICD-10-CM | POA: Diagnosis not present

## 2020-03-07 DIAGNOSIS — Z87891 Personal history of nicotine dependence: Secondary | ICD-10-CM

## 2020-03-07 DIAGNOSIS — F17210 Nicotine dependence, cigarettes, uncomplicated: Secondary | ICD-10-CM | POA: Diagnosis not present

## 2020-03-08 ENCOUNTER — Other Ambulatory Visit: Payer: Self-pay | Admitting: *Deleted

## 2020-03-08 DIAGNOSIS — Z87891 Personal history of nicotine dependence: Secondary | ICD-10-CM

## 2020-03-08 DIAGNOSIS — F17210 Nicotine dependence, cigarettes, uncomplicated: Secondary | ICD-10-CM

## 2020-03-08 NOTE — Progress Notes (Signed)
Please call patient and let them  know their  low dose Ct was read as a Lung RADS 1, negative study: no nodules or definitely benign nodules. Radiology recommendation is for a repeat LDCT in 12 months. .Please let them  know we will order and schedule their  annual screening scan for 02/2021. Please let them  know there was notation of CAD on their  scan.  Please remind the patient  that this is a non-gated exam therefore degree or severity of disease  cannot be determined. Please have them  follow up with their PCP regarding potential risk factor modification, dietary therapy or pharmacologic therapy if clinically indicated. Pt.  is  currently on statin therapy. Please place order for annual  screening scan for  02/2021 and fax results to PCP. Thanks so much.

## 2020-07-04 ENCOUNTER — Telehealth: Payer: Self-pay | Admitting: Family Medicine

## 2020-07-04 DIAGNOSIS — G40909 Epilepsy, unspecified, not intractable, without status epilepticus: Secondary | ICD-10-CM

## 2020-07-04 MED ORDER — LEVETIRACETAM 1000 MG PO TABS: 1000.0000 mg | ORAL_TABLET | Freq: Two times a day (BID) | ORAL | 1 refills | Status: DC

## 2020-07-04 NOTE — Telephone Encounter (Signed)
Wife has called for a refill of pt's levETIRAcetam (KEPPRA) 1000 MG tablet to [facility] [phone] Pt's annual f/u has also been scheduled and he is on wait list asking to be seen earlier.  Wife is asking if pt can get refills until office visit

## 2020-07-04 NOTE — Telephone Encounter (Addendum)
Patient's FU is late March. Refilled Keppra x 6 months. Called wife and informed. She verbalized understanding, appreciation.

## 2020-07-04 NOTE — Addendum Note (Signed)
Addended by: Minna Antis on: 07/04/2020 03:33 PM   Modules accepted: Orders

## 2020-08-19 DIAGNOSIS — Z20822 Contact with and (suspected) exposure to covid-19: Secondary | ICD-10-CM | POA: Diagnosis not present

## 2020-08-22 DIAGNOSIS — U071 COVID-19: Secondary | ICD-10-CM | POA: Diagnosis not present

## 2020-08-22 DIAGNOSIS — J449 Chronic obstructive pulmonary disease, unspecified: Secondary | ICD-10-CM | POA: Diagnosis not present

## 2020-08-28 DIAGNOSIS — H353112 Nonexudative age-related macular degeneration, right eye, intermediate dry stage: Secondary | ICD-10-CM | POA: Diagnosis not present

## 2020-08-28 DIAGNOSIS — H2513 Age-related nuclear cataract, bilateral: Secondary | ICD-10-CM | POA: Diagnosis not present

## 2020-08-28 DIAGNOSIS — H40013 Open angle with borderline findings, low risk, bilateral: Secondary | ICD-10-CM | POA: Diagnosis not present

## 2020-08-28 DIAGNOSIS — H353121 Nonexudative age-related macular degeneration, left eye, early dry stage: Secondary | ICD-10-CM | POA: Diagnosis not present

## 2020-08-29 DIAGNOSIS — R059 Cough, unspecified: Secondary | ICD-10-CM | POA: Diagnosis not present

## 2020-08-29 DIAGNOSIS — J449 Chronic obstructive pulmonary disease, unspecified: Secondary | ICD-10-CM | POA: Diagnosis not present

## 2020-08-29 DIAGNOSIS — E538 Deficiency of other specified B group vitamins: Secondary | ICD-10-CM | POA: Diagnosis not present

## 2020-09-21 DIAGNOSIS — Z1389 Encounter for screening for other disorder: Secondary | ICD-10-CM | POA: Diagnosis not present

## 2020-09-21 DIAGNOSIS — Z0000 Encounter for general adult medical examination without abnormal findings: Secondary | ICD-10-CM | POA: Diagnosis not present

## 2020-09-25 ENCOUNTER — Ambulatory Visit: Payer: Medicare Other | Admitting: Family Medicine

## 2020-09-26 DIAGNOSIS — E538 Deficiency of other specified B group vitamins: Secondary | ICD-10-CM | POA: Diagnosis not present

## 2020-10-08 DIAGNOSIS — Z79899 Other long term (current) drug therapy: Secondary | ICD-10-CM | POA: Diagnosis not present

## 2020-10-08 DIAGNOSIS — E7800 Pure hypercholesterolemia, unspecified: Secondary | ICD-10-CM | POA: Diagnosis not present

## 2020-10-08 DIAGNOSIS — D510 Vitamin B12 deficiency anemia due to intrinsic factor deficiency: Secondary | ICD-10-CM | POA: Diagnosis not present

## 2020-10-08 DIAGNOSIS — J449 Chronic obstructive pulmonary disease, unspecified: Secondary | ICD-10-CM | POA: Diagnosis not present

## 2020-10-08 DIAGNOSIS — E538 Deficiency of other specified B group vitamins: Secondary | ICD-10-CM | POA: Diagnosis not present

## 2020-10-08 DIAGNOSIS — F17200 Nicotine dependence, unspecified, uncomplicated: Secondary | ICD-10-CM | POA: Diagnosis not present

## 2020-10-08 DIAGNOSIS — Z0000 Encounter for general adult medical examination without abnormal findings: Secondary | ICD-10-CM | POA: Diagnosis not present

## 2020-10-08 DIAGNOSIS — E559 Vitamin D deficiency, unspecified: Secondary | ICD-10-CM | POA: Diagnosis not present

## 2020-12-24 ENCOUNTER — Ambulatory Visit
Admission: RE | Admit: 2020-12-24 | Discharge: 2020-12-24 | Disposition: A | Discharge status: Home / Self Care | Payer: Medicare Other | Source: Ambulatory Visit | Attending: Family Medicine | Admitting: Family Medicine

## 2020-12-24 ENCOUNTER — Other Ambulatory Visit: Payer: Self-pay

## 2020-12-24 ENCOUNTER — Other Ambulatory Visit: Payer: Self-pay | Admitting: Family Medicine

## 2020-12-24 DIAGNOSIS — J449 Chronic obstructive pulmonary disease, unspecified: Secondary | ICD-10-CM

## 2020-12-31 ENCOUNTER — Telehealth: Payer: Self-pay | Admitting: Family Medicine

## 2020-12-31 DIAGNOSIS — G40909 Epilepsy, unspecified, not intractable, without status epilepticus: Secondary | ICD-10-CM

## 2020-12-31 MED ORDER — LEVETIRACETAM 1000 MG PO TABS: 1000.0000 mg | ORAL_TABLET | Freq: Two times a day (BID) | ORAL | 0 refills | Status: DC

## 2020-12-31 NOTE — Telephone Encounter (Signed)
Pt request refill levETIRAcetam (KEPPRA) 1000 MG tablet at [facility]

## 2021-02-05 ENCOUNTER — Other Ambulatory Visit (HOSPITAL_COMMUNITY): Payer: Self-pay | Admitting: Family Medicine

## 2021-02-05 DIAGNOSIS — I739 Peripheral vascular disease, unspecified: Secondary | ICD-10-CM

## 2021-02-12 ENCOUNTER — Other Ambulatory Visit: Payer: Self-pay

## 2021-02-12 ENCOUNTER — Ambulatory Visit (HOSPITAL_COMMUNITY)
Admission: RE | Admit: 2021-02-12 | Discharge: 2021-02-12 | Disposition: A | Discharge status: Home / Self Care | Payer: Medicare Other | Source: Ambulatory Visit | Attending: Family Medicine | Admitting: Family Medicine

## 2021-02-12 DIAGNOSIS — I739 Peripheral vascular disease, unspecified: Secondary | ICD-10-CM | POA: Insufficient documentation

## 2021-02-12 NOTE — Progress Notes (Signed)
ABI w/ TBI study completed.   Please see CV Proc for preliminary results.   Raejean Swinford, RDMS, RVT  

## 2021-02-22 ENCOUNTER — Ambulatory Visit (HOSPITAL_BASED_OUTPATIENT_CLINIC_OR_DEPARTMENT_OTHER): Payer: Medicare Other | Admitting: Cardiology

## 2021-03-08 ENCOUNTER — Other Ambulatory Visit: Payer: Self-pay

## 2021-03-08 ENCOUNTER — Ambulatory Visit (INDEPENDENT_AMBULATORY_CARE_PROVIDER_SITE_OTHER)
Admission: RE | Admit: 2021-03-08 | Discharge: 2021-03-08 | Disposition: A | Discharge status: Home / Self Care | Payer: Medicare Other | Source: Ambulatory Visit | Attending: Family Medicine | Admitting: Family Medicine

## 2021-03-08 DIAGNOSIS — Z87891 Personal history of nicotine dependence: Secondary | ICD-10-CM | POA: Diagnosis not present

## 2021-03-08 DIAGNOSIS — F17210 Nicotine dependence, cigarettes, uncomplicated: Secondary | ICD-10-CM

## 2021-03-14 ENCOUNTER — Ambulatory Visit (HOSPITAL_BASED_OUTPATIENT_CLINIC_OR_DEPARTMENT_OTHER): Payer: Medicare Other | Admitting: Cardiology

## 2021-03-14 ENCOUNTER — Other Ambulatory Visit: Payer: Self-pay

## 2021-03-14 ENCOUNTER — Encounter (HOSPITAL_BASED_OUTPATIENT_CLINIC_OR_DEPARTMENT_OTHER): Payer: Self-pay | Admitting: Cardiology

## 2021-03-14 VITALS — BP 118/71 | HR 61 | Ht 68.0 in | Wt 119.8 lb

## 2021-03-14 DIAGNOSIS — I700 Atherosclerosis of aorta: Secondary | ICD-10-CM | POA: Diagnosis not present

## 2021-03-14 DIAGNOSIS — Z79899 Other long term (current) drug therapy: Secondary | ICD-10-CM

## 2021-03-14 DIAGNOSIS — Z7189 Other specified counseling: Secondary | ICD-10-CM

## 2021-03-14 DIAGNOSIS — F17210 Nicotine dependence, cigarettes, uncomplicated: Secondary | ICD-10-CM

## 2021-03-14 DIAGNOSIS — Z716 Tobacco abuse counseling: Secondary | ICD-10-CM

## 2021-03-14 DIAGNOSIS — I2510 Atherosclerotic heart disease of native coronary artery without angina pectoris: Secondary | ICD-10-CM

## 2021-03-14 DIAGNOSIS — I2584 Coronary atherosclerosis due to calcified coronary lesion: Secondary | ICD-10-CM

## 2021-03-14 MED ORDER — ROSUVASTATIN CALCIUM 20 MG PO TABS: 20.0000 mg | ORAL_TABLET | Freq: Every day | ORAL | 3 refills | Status: DC

## 2021-03-14 MED ORDER — ASPIRIN EC 81 MG PO TBEC: 81.0000 mg | DELAYED_RELEASE_TABLET | Freq: Every day | ORAL | 3 refills | Status: DC

## 2021-03-14 NOTE — Progress Notes (Signed)
Cardiology Office Note:    Date:  03/14/2021   ID:  Joshua Reed, DOB 12-25-1945, MRN [id number]  PCP:  Lawerance Cruel, MD  Cardiologist:  Buford Dresser, MD  Referring MD: Hulan Fess, MD   CC: establish care  History of Present Illness:    Joshua Reed is a 75 y.o. male with a hx of current tobacco abuse, prior alcohol abuse, COPD, hyperlipidemia, and seizures, who is seen for follow-up. He was previously a patient of Dr. Tamala Julian, and was last seen by him 10/07/2019. At that visit his LDL was 118 and 10 mg rosuvastatin was started.  Cardiovascular risk factors: Prior clinical ASCVD: None Comorbid conditions, including hypertension, diabetes, chronic kidney disease: hyperlipidemia Metabolic syndrome/Obesity: none Chronic inflammatory conditions: None Tobacco use history: smokes 10 cigarettes a day (1+ ppd at peak), since he was a teenager Prior cardiac testing and/or incidental findings on other testing: Stress tests, Heart cath (no blockages) Exercise level: Occasionally plays golf with his son. Not much formal exercise due to laziness, states he is able to do what he wants to do. Retired at 75 yo from [employer].  Primarily he presents today based on the recommendation of Dr. Harrington Challenger. He had a CT lung cancer screening which showed aortic, coronary and aortic valve calcifications, which we discussed in depth today.  Today, he reports his shortness of breath "comes and goes." Also, he frequently experiences bilateral LE myalgias and aches.  Years ago he quit drinking alcohol. He has tried to quit smoking, but this is difficult.   We reviewed the cardiovascular results of his recent CT of his chest (02/2021).   He denies any palpitations, or chest pain. No lightheadedness, headaches, syncope, orthopnea, or PND. Also has no lower extremity edema or exertional symptoms.   Past Medical History:  Diagnosis Date   Alcohol abuse    COPD (chronic  obstructive pulmonary disease) ([place])    Hyperlipidemia    Low vitamin B12 level    Macular degeneration    Seizure disorder ([place]) 2205   ALCOHOL RELATED   Seizures ([place])    Vitamin D deficiency     Past Surgical History:  Procedure Laterality Date   CARDIAC CATHETERIZATION  2002   normal   FLEXIBLE SIGMOIDOSCOPY N/A 06/28/2014   Procedure: FLEXIBLE SIGMOIDOSCOPY;  Surgeon: Lear Ng, MD;  Location: [facility];  Service: Endoscopy;  Laterality: N/A;   INTRAMEDULLARY (IM) NAIL INTERTROCHANTERIC Right 05/24/2014   Procedure: INTRAMEDULLARY (IM) NAIL INTERTROCHANTRIC;  Surgeon: Marianna Payment, MD;  Location: [facility];  Service: Orthopedics;  Laterality: Right;   OPEN REDUCTION INTERNAL FIXATION (ORIF) DISTAL RADIAL FRACTURE Right 05/24/2014   Procedure: OPEN REDUCTION INTERNAL FIXATION (ORIF) DISTAL RADIAL FRACTURE;  Surgeon: Marianna Payment, MD;  Location: [facility];  Service: Orthopedics;  Laterality: Right;    Current Medications: Current Outpatient Medications on File Prior to Visit  Medication Sig   acetaminophen (TYLENOL) 325 MG tablet Take 1-2 tablets (325-650 mg total) by mouth every 4 (four) hours as needed for mild pain.   Budesonide-Formoterol Fumarate (SYMBICORT IN) Takes 2 puffs into the lungs twice daily as needed for shortness of breath   Coenzyme Q10 (CO Q 10 PO) Take 1 tablet by mouth daily.   Cyanocobalamin (B-12 IJ) Takes injection as directed bi weekly   ferrous sulfate 325 (65 FE) MG tablet Take 325 mg by mouth 3 (three) times daily with meals.   levETIRAcetam (KEPPRA) 1000 MG tablet Take 1 tablet (1,000 mg  total) by mouth 2 (two) times daily.   Multiple Vitamin (MULTIVITAMIN WITH MINERALS) TABS tablet Take 1 tablet by mouth daily.   Omega-3 Fatty Acids (FISH OIL PO) Take 1 capsule by mouth daily.   Vitamin D, Ergocalciferol, (DRISDOL) 50000 UNITS CAPS capsule Take 50,000 Units by mouth once a week.   No current facility-administered medications on file  prior to visit.     Allergies:   Acyclovir and related   Social History   Tobacco Use   Smoking status: Every Day    Packs/day: 0.50    Types: Cigarettes    Last attempt to quit: 05/07/2011    Years since quitting: 9.8   Smokeless tobacco: Never   Tobacco comments:    12/17/16  vaping  Vaping Use   Vaping Use: Never used  Substance Use Topics   Alcohol use: Yes    Alcohol/week: 4.0 standard drinks    Types: 4 Cans of beer per week    Comment: quit 2-3  years ago   Drug use: No    Family History: family history includes Alzheimer's disease in his father; Colon cancer in his sister; Heart Problems in his brother; Heart attack (age of onset: 64) in his mother; Rheumatic fever in his mother.  ROS:   Please see the history of present illness.  Additional pertinent ROS: Constitutional: Negative for chills, fever, night sweats, unintentional weight loss  HENT: Negative for ear pain and hearing loss.   Eyes: Negative for loss of vision and eye pain.  Respiratory: Positive for shortness of breath. Negative for cough, sputum, wheezing.   Cardiovascular: See HPI. Gastrointestinal: Negative for abdominal pain, melena, and hematochezia.  Genitourinary: Negative for dysuria and hematuria.  Musculoskeletal: Positive for bilateral LE myalgias. Negative for falls.  Skin: Negative for itching and rash.  Neurological: Negative for focal weakness, focal sensory changes and loss of consciousness.  Endo/Heme/Allergies: Does not bruise/bleed easily.     EKGs/Labs/Other Studies Reviewed:    The following studies were reviewed today:  CT Chest 03/08/2021: COMPARISON:  03/07/2020.   FINDINGS: Cardiovascular: Atherosclerotic calcification of the aorta, aortic valve and coronary arteries. Heart size normal. No pericardial effusion.   Mediastinum/Nodes: No pathologically enlarged mediastinal or axillary lymph nodes. Hilar regions are difficult to definitively evaluate without IV contrast.  Esophagus is grossly unremarkable.   Lungs/Pleura: Centrilobular emphysema. Biapical pleuroparenchymal scarring. Calcified right lower lobe granuloma. No suspicious pulmonary nodules. No pleural fluid. Airway is unremarkable.   Upper Abdomen: Visualized portions of the liver, gallbladder, adrenal glands, kidneys, spleen, pancreas, stomach and bowel are grossly unremarkable.   Musculoskeletal: Degenerative changes in the spine. No worrisome lytic or sclerotic lesions.   IMPRESSION: 1. Lung-RADS 1, negative. Continue annual screening with low-dose chest CT without contrast in 12 months. 2. Aortic atherosclerosis (ICD10-I70.0). Coronary artery calcification. 3.  Emphysema (ICD10-J43.9).  ABI 02/12/2021: Right: Resting right ankle-brachial index is within normal range. No  evidence of significant right lower extremity arterial disease. The right  toe-brachial index is normal.   Left: Resting left ankle-brachial index is within normal range. No  evidence of significant left lower extremity arterial disease. The left  toe-brachial index is normal.   ETT 05/20/2017: Blood pressure demonstrated a hypertensive response to exercise. No T wave inversion was noted during stress. There was no ST segment deviation noted during stress. Overall, the patient's exercise capacity was normal. Duke Treadmill Score: low risk   Negative stress test without evidence of ischemia at given workload.  EKG:  EKG  is personally reviewed.   03/14/2021: NSR at 61 bpm, septal R wave pattern unchanged from prior  Recent Labs: No results found for requested labs within last 8760 hours.  Recent Lipid Panel No results found for: CHOL, TRIG, HDL, CHOLHDL, VLDL, LDLCALC, LDLDIRECT  Physical Exam:    VS:  BP 118/71   Pulse 61   Ht '5\' 8"'$  (1.727 m)   Wt 119 lb 12.8 oz (54.3 kg)   SpO2 95%   BMI 18.22 kg/m     Wt Readings from Last 3 Encounters:  03/14/21 119 lb 12.8 oz (54.3 kg)  10/07/19 131 lb 1.9 oz  (59.5 kg)  04/20/19 127 lb 9.6 oz (57.9 kg)    GEN: Well nourished, well developed in no acute distress HEENT: Normal, moist mucous membranes NECK: No JVD CARDIAC: regular rhythm, normal S1 and S2, no rubs or gallops. No murmur. VASCULAR: Radial and DP pulses 2+ bilaterally. No carotid bruits RESPIRATORY:  Clear to auscultation without rales, wheezing or rhonchi  ABDOMEN: Soft, non-tender, non-distended MUSCULOSKELETAL:  Ambulates independently SKIN: Warm and dry, no edema NEUROLOGIC:  Alert and oriented x 3. No focal neuro deficits noted. PSYCHIATRIC:  Normal affect    ASSESSMENT:    1. Aortic atherosclerosis (HCC)   2. Cardiac risk counseling   3. Tobacco abuse counseling   4. Counseling on health promotion and disease prevention   5. Medication management   6. Calcification of coronary artery    PLAN:    Aortic atherosclerosis Coronary artery calcification Hypercholesterolemia -discussed pathology of cholesterol, how plaques form, that MI/CVA result commonly from acute plaque rupture and not gradual stenosis. Discussed mechanism of statin to both decrease plaque accumulation and stabilize plaque that is already present. Discussed that calcium is a marker for plaque. -LDL goal <70. LDL check 3.21.2022 with LDL 105. Will increase rosuvastatin, recheck lipids in 2 mos -discussed recommendations for aspirin, he is amenable. Counseled on monitoring for bleeding -counseled on red flag warning signs that need immediate medical attention -ECG unchanged today  Tobacco cessation counseling: The patient was counseled on tobacco cessation today for 4 minutes.  Counseling included reviewing the risks of smoking tobacco products, how it impacts the patient's current medical diagnoses and different strategies for quitting.  Pharmacotherapy to aid in tobacco cessation was not prescribed today.   Cardiac risk counseling and prevention recommendations: -recommend heart healthy/Mediterranean  diet, with whole grains, fruits, vegetable, fish, lean meats, nuts, and olive oil. Limit salt. -recommend moderate walking, 3-5 times/week for 30-50 minutes each session. Aim for at least 150 minutes.week. Goal should be pace of 3 miles/hours, or walking 1.5 miles in 30 minutes -recommend avoidance of tobacco products. Avoid excess alcohol. -ASCVD risk score: The ASCVD Risk score Mikey Bussing DC Jr., et al., 2013) failed to calculate for the following reasons:   Cannot find a previous HDL lab   Cannot find a previous total cholesterol lab    Plan for follow up: 1 year or sooner as needed.  Buford Dresser, MD, PhD, [facility]    Medication Adjustments/Labs and Tests Ordered: Current medicines are reviewed at length with the patient today.  Concerns regarding medicines are outlined above.  Orders Placed This Encounter  Procedures   Lipid panel   EKG 12-Lead    Meds ordered this encounter  Medications   rosuvastatin (CRESTOR) 20 MG tablet    Sig: Take 1 tablet (20 mg total) by mouth daily.    Dispense:  90 tablet  Refill:  3   aspirin EC 81 MG tablet    Sig: Take 1 tablet (81 mg total) by mouth daily. Swallow whole.    Dispense:  90 tablet    Refill:  3     Patient Instructions  Medication Instructions:  Increase Rosuvastatin to 20 mg daily.  *If you need a refill on your cardiac medications before your next appointment, please call your pharmacy*   Lab Work: Your physician recommends that you return for lab work in 6-8 weeks (fasting lipid).  If you have labs (blood work) drawn today and your tests are completely normal, you will receive your results only by: [place] (if you have MyChart) OR A paper copy in the mail If you have any lab test that is abnormal or we need to change your treatment, we will call you to review the results.   Testing/Procedures: None ordered today   Follow-Up: At [facility], you and your health needs  are our priority.  As part of our continuing mission to provide you with exceptional heart care, we have created designated Provider Care Teams.  These Care Teams include your primary Cardiologist (physician) and Advanced Practice Providers (APPs -  Physician Assistants and Nurse Practitioners) who all work together to provide you with the care you need, when you need it.  We recommend signing up for the patient portal called "MyChart".  Sign up information is provided on this After Visit Summary.  MyChart is used to connect with patients for Virtual Visits (Telemedicine).  Patients are able to view lab/test results, encounter notes, upcoming appointments, etc.  Non-urgent messages can be sent to your provider as well.   To learn more about what you can do with MyChart, go to [email or web address].    Your next appointment:   1 year(s)  The format for your next appointment:   In Person  Provider:   Buford Dresser, MD      [facility],acting as a scribe for Buford Dresser, MD.,have documented all relevant documentation on the behalf of Buford Dresser, MD,as directed by  Buford Dresser, MD while in the presence of Buford Dresser, MD.  I, Buford Dresser, MD, have reviewed all documentation for this visit. The documentation on 03/14/21 for the exam, diagnosis, procedures, and orders are all accurate and complete.   Signed, Buford Dresser, MD PhD 03/14/2021 1:11 PM     [facility]

## 2021-03-14 NOTE — Patient Instructions (Signed)
Medication Instructions:  Increase Rosuvastatin to 20 mg daily.  *If you need a refill on your cardiac medications before your next appointment, please call your pharmacy*   Lab Work: Your physician recommends that you return for lab work in 6-8 weeks (fasting lipid).  If you have labs (blood work) drawn today and your tests are completely normal, you will receive your results only by: [place] (if you have MyChart) OR A paper copy in the mail If you have any lab test that is abnormal or we need to change your treatment, we will call you to review the results.   Testing/Procedures: None ordered today   Follow-Up: At [facility], you and your health needs are our priority.  As part of our continuing mission to provide you with exceptional heart care, we have created designated Provider Care Teams.  These Care Teams include your primary Cardiologist (physician) and Advanced Practice Providers (APPs -  Physician Assistants and Nurse Practitioners) who all work together to provide you with the care you need, when you need it.  We recommend signing up for the patient portal called "MyChart".  Sign up information is provided on this After Visit Summary.  MyChart is used to connect with patients for Virtual Visits (Telemedicine).  Patients are able to view lab/test results, encounter notes, upcoming appointments, etc.  Non-urgent messages can be sent to your provider as well.   To learn more about what you can do with MyChart, go to [email or web address].    Your next appointment:   1 year(s)  The format for your next appointment:   In Person  Provider:   Buford Dresser, MD

## 2021-03-23 NOTE — Progress Notes (Signed)
Please call patient and let them  know their  low dose Ct was read as a Lung RADS 1, negative study: no nodules or definitely benign nodules. Radiology recommendation is for a repeat LDCT in 12 months. .Please let them  know we will order and schedule their  annual screening scan for  02/2022. Please let them  know there was notation of CAD on their  scan.  Please remind the patient  that this is a non-gated exam therefore degree or severity of disease  cannot be determined. Please have them  follow up with their PCP regarding potential risk factor modification, dietary therapy or pharmacologic therapy if clinically indicated. Pt.  is  currently on statin therapy. Please place order for annual  screening scan for   02/2022 and fax results to PCP. Thanks so much.  + CAD, on statin, seen by cards. Thanks

## 2021-03-26 ENCOUNTER — Other Ambulatory Visit: Payer: Self-pay | Admitting: *Deleted

## 2021-03-26 DIAGNOSIS — Z87891 Personal history of nicotine dependence: Secondary | ICD-10-CM

## 2021-03-26 DIAGNOSIS — F17210 Nicotine dependence, cigarettes, uncomplicated: Secondary | ICD-10-CM

## 2021-04-11 ENCOUNTER — Other Ambulatory Visit: Payer: Self-pay | Admitting: Neurology

## 2021-04-11 DIAGNOSIS — G40909 Epilepsy, unspecified, not intractable, without status epilepticus: Secondary | ICD-10-CM

## 2021-04-11 MED ORDER — LEVETIRACETAM 1000 MG PO TABS: 1000.0000 mg | ORAL_TABLET | Freq: Two times a day (BID) | ORAL | 0 refills | Status: DC

## 2021-04-18 ENCOUNTER — Ambulatory Visit: Payer: Medicare Other | Admitting: Family Medicine

## 2021-07-23 DIAGNOSIS — I2510 Atherosclerotic heart disease of native coronary artery without angina pectoris: Secondary | ICD-10-CM | POA: Diagnosis not present

## 2021-07-23 DIAGNOSIS — J441 Chronic obstructive pulmonary disease with (acute) exacerbation: Secondary | ICD-10-CM | POA: Diagnosis not present

## 2021-07-23 DIAGNOSIS — R0989 Other specified symptoms and signs involving the circulatory and respiratory systems: Secondary | ICD-10-CM | POA: Diagnosis not present

## 2021-07-23 DIAGNOSIS — F17200 Nicotine dependence, unspecified, uncomplicated: Secondary | ICD-10-CM | POA: Diagnosis not present

## 2021-07-23 DIAGNOSIS — Z20822 Contact with and (suspected) exposure to covid-19: Secondary | ICD-10-CM | POA: Diagnosis not present

## 2021-07-23 DIAGNOSIS — R0789 Other chest pain: Secondary | ICD-10-CM | POA: Diagnosis not present

## 2021-07-25 DIAGNOSIS — E538 Deficiency of other specified B group vitamins: Secondary | ICD-10-CM | POA: Diagnosis not present

## 2021-07-27 DIAGNOSIS — G40909 Epilepsy, unspecified, not intractable, without status epilepticus: Secondary | ICD-10-CM | POA: Diagnosis not present

## 2021-07-27 DIAGNOSIS — I252 Old myocardial infarction: Secondary | ICD-10-CM | POA: Diagnosis not present

## 2021-07-27 DIAGNOSIS — R42 Dizziness and giddiness: Secondary | ICD-10-CM | POA: Diagnosis not present

## 2021-07-27 DIAGNOSIS — I499 Cardiac arrhythmia, unspecified: Secondary | ICD-10-CM | POA: Diagnosis not present

## 2021-07-27 DIAGNOSIS — Z87820 Personal history of traumatic brain injury: Secondary | ICD-10-CM | POA: Diagnosis not present

## 2021-07-27 DIAGNOSIS — Z79899 Other long term (current) drug therapy: Secondary | ICD-10-CM | POA: Diagnosis not present

## 2021-07-27 DIAGNOSIS — E860 Dehydration: Secondary | ICD-10-CM | POA: Diagnosis not present

## 2021-07-27 DIAGNOSIS — R55 Syncope and collapse: Secondary | ICD-10-CM | POA: Diagnosis not present

## 2021-07-27 DIAGNOSIS — F17210 Nicotine dependence, cigarettes, uncomplicated: Secondary | ICD-10-CM | POA: Diagnosis not present

## 2021-07-27 DIAGNOSIS — R9431 Abnormal electrocardiogram [ECG] [EKG]: Secondary | ICD-10-CM | POA: Diagnosis not present

## 2021-08-14 DIAGNOSIS — J441 Chronic obstructive pulmonary disease with (acute) exacerbation: Secondary | ICD-10-CM | POA: Diagnosis not present

## 2021-08-14 DIAGNOSIS — R197 Diarrhea, unspecified: Secondary | ICD-10-CM | POA: Diagnosis not present

## 2021-08-27 ENCOUNTER — Ambulatory Visit (INDEPENDENT_AMBULATORY_CARE_PROVIDER_SITE_OTHER): Payer: No Typology Code available for payment source | Admitting: Internal Medicine

## 2021-08-27 ENCOUNTER — Other Ambulatory Visit: Payer: Self-pay

## 2021-08-27 ENCOUNTER — Encounter: Payer: Self-pay | Admitting: Internal Medicine

## 2021-08-27 VITALS — BP 110/64 | HR 98 | Temp 98.0°F | Ht 68.0 in | Wt 124.0 lb

## 2021-08-27 DIAGNOSIS — F17200 Nicotine dependence, unspecified, uncomplicated: Secondary | ICD-10-CM

## 2021-08-27 DIAGNOSIS — J449 Chronic obstructive pulmonary disease, unspecified: Secondary | ICD-10-CM

## 2021-08-27 NOTE — Progress Notes (Signed)
Joshua Reed    [id number]    01-18-1946  Primary Care Physician:Ross, Dwyane Luo, MD  Referring Physician: Lawerance Cruel, MD [place],  [place] [address] Reason for Consultation: dry cough Date of Consultation: 08/27/2021  Chief complaint:   Chief Complaint  Patient presents with   Consult    He feels that he was having a dry cough for 6 weeks recently, he was feeling bad since the end of December.      HPI: Joshua Reed is a .76 y.o. man who presents for new patient evaluation of coughing and dyspnea. Symptoms have been progressing for the past few weeks. Cough is worse in the morning and at night, and associated with shortness of breath. Was treated with steroids which finished up about a week ago, along with antibiotics. Prescribed by PCP Dr. Harrington Challenger.   He has a history tobacco use disorder - 60 pack years. Quit 3 weeks ago.   Has dyspnea on exertion - can do daily activities of living around the house, but lifting things means he has to use his albuterol rescue inhaler. Uses more when he is sick. Uses about twice a day.    Has seizure disorder and takes keppra from prior head injury.  Former heavy alcohol use, quit 15 years ago.    Social history:  Occupation: Retired from [employer] for 45 years.  Exposures: lives at home with his wife.  Smoking history: 60 years x 1 ppd - 60 pack years  Social History   Occupational History    Employer: OTHER    Comment: n/a  Tobacco Use   Smoking status: Former    Packs/day: 0.50    Types: Cigarettes    Quit date: 08/06/2021    Years since quitting: 0.0   Smokeless tobacco: Never  Vaping Use   Vaping Use: Never used  Substance and Sexual Activity   Alcohol use: Yes    Alcohol/week: 4.0 standard drinks    Types: 4 Cans of beer per week    Comment: quit 2-3  years ago   Drug use: No   Sexual activity: Not on file    Relevant family history:  Family History  Problem Relation Age of Onset    Heart attack Mother 40   Rheumatic fever Mother    Alzheimer's disease Father    Colon cancer Sister    Heart Problems Brother        open heart surgery   Asthma Child     Past Medical History:  Diagnosis Date   Alcohol abuse    COPD (chronic obstructive pulmonary disease) ([place])    Hyperlipidemia    Low vitamin B12 level    Macular degeneration    Seizure disorder ([place]) 2205   ALCOHOL RELATED   Seizures ([place])    Vitamin D deficiency     Past Surgical History:  Procedure Laterality Date   CARDIAC CATHETERIZATION  2002   normal   FLEXIBLE SIGMOIDOSCOPY N/A 06/28/2014   Procedure: FLEXIBLE SIGMOIDOSCOPY;  Surgeon: Lear Ng, MD;  [facility]: [facility];  Service: Endoscopy;  Laterality: N/A;   INTRAMEDULLARY (IM) NAIL INTERTROCHANTERIC Right 05/24/2014   Procedure: INTRAMEDULLARY (IM) NAIL INTERTROCHANTRIC;  Surgeon: Marianna Payment, MD;  Location: [facility];  Service: Orthopedics;  Laterality: Right;   OPEN REDUCTION INTERNAL FIXATION (ORIF) DISTAL RADIAL FRACTURE Right 05/24/2014   Procedure: OPEN REDUCTION INTERNAL FIXATION (ORIF) DISTAL RADIAL FRACTURE;  Surgeon: Mosetta Pigeon  Erlinda Hong, MD;  Location: [facility];  Service: Orthopedics;  Laterality: Right;     Physical Exam: Blood pressure 110/64, pulse 98, temperature 98 F (36.7 C), temperature source Oral, height 5\' 8"  (1.727 m), weight 124 lb (56.2 kg), SpO2 98 %. Gen:      No acute distress ENT:  no nasal polyps, mucus membranes moist Lungs:    No increased respiratory effort, symmetric chest wall excursion, clear to auscultation bilaterally, no wheezes or crackles, diminished bilaterally CV:         Diminished, Regular rate and rhythm; no murmurs, rubs, or gallops.  No pedal edema Abd:      + bowel sounds; soft, non-tender; no distension MSK: no acute synovitis of DIP or PIP joints, no mechanics hands.  Skin:      Warm and dry; no rashes Neuro: normal speech, no focal facial asymmetry Psych: alert and oriented x3,  normal mood and affect   Data Reviewed/Medical Decision Making:  Independent interpretation of tests: Imaging:  Review of patient's CT Chest August 2022 images revealed emphysema. No nodules concering for malignancy. The patient's images have been independently reviewed by me.    PFTs: No flowsheet data found.  Labs:  Lab Results  Component Value Date   WBC 8.6 07/03/2014   HGB 11.4 (L) 07/03/2014   HCT 34.6 (L) 07/03/2014   MCV 91.5 07/03/2014   PLT 278 07/03/2014   Lab Results  Component Value Date   NA 134 (L) 07/03/2014   K 4.2 07/03/2014   CL 100 07/03/2014   CO2 25 07/03/2014      Immunization status:  Immunization History  Administered Date(s) Administered   Influenza, High Dose Seasonal PF 07/12/2014, 05/15/2017, 07/29/2018, 05/16/2019   PFIZER(Purple Top)SARS-COV-2 Vaccination 09/25/2019, 10/19/2019   Pneumococcal Conjugate-13 01/10/2015   Pneumococcal Polysaccharide-23 04/14/2013   Tdap 05/24/2014   Zoster, Live 04/14/2013     I reviewed prior external note(s) from PCP, cardiology  I reviewed the result(s) of the labs and imaging as noted above.   I have ordered PFT  Assessment:  COPD with emphysema, new diagnosis Tobacco use disorder, 60 pack years, recent cessation  Plan/Recommendations: Continue prn albuterol. He would like to hold off on maintenance inhalers for now, but may need in future Will obtain full set of PFTs to monitor disease progression.  Will follow up on smoking cessation at next visit. He quit cold [place].   We discussed disease management and progression at length today regarding new diagnosis COPD.    Return to Care: Return in about 3 months (around 11/24/2021).  Lenice Llamas, MD Pulmonary and [place]  CC: Lawerance Cruel, MD

## 2021-08-27 NOTE — Patient Instructions (Signed)
Please schedule follow up scheduled with myself in 3 months.  If my schedule is not open yet, we will contact you with a reminder closer to that time. Please call [phone] if you haven't heard from [place] a month before.   Before your next visit I would like you to have:  Full set of PFTs  Continue your rescue inhaler with albuterol.  You can take this up to four times a day as needed. If you're taking it more frequently we may need to start a maintenance inhaler.   Understanding COPD   What is COPD? COPD stands for chronic obstructive pulmonary (lung) disease. COPD is a general term used for several lung diseases.  COPD is an umbrella term and encompasses other  common diseases in this group like chronic bronchitis and emphysema. Chronic asthma may also be included in this group. While some patients with COPD have only chronic bronchitis or emphysema, most patients have a combination of both.  You might hear these terms used in exchange for one another.   COPD adds to the work of the heart. Diseased lungs may reduce the amount of oxygen that goes to the blood. High blood pressure in blood vessels from the heart to the lungs makes it difficult for the heart to pump. Lung disease can also cause the body to produce too many red blood cells which may make the blood thicker and harder to pump.   Patients who have COPD with low oxygen levels may develop an enlarged heart (cor pulmonale). This condition weakens the heart and causes increased shortness of breath and swelling in the legs and feet.   Chronic bronchitis Chronic bronchitis is irritation and inflammation (swelling) of the lining in the bronchial tubes (air passages). The irritation causes coughing and an excess amount of mucus in the airways. The swelling makes it difficult to get air in and out of the lungs. The small, hair-like structures on the inside of the airways (called cilia) may be damaged by the irritation. The cilia are then  unable to help clean mucus from the airways.  Bronchitis is generally considered to be chronic when you have: a productive cough (cough up mucus) and shortness of breath that lasts about 3 months or more each year for 2 or more years in a row. Your doctor may define chronic bronchitis differently.   Emphysema Emphysema is the destruction, or breakdown, of the walls of the alveoli (air sacs) located at the end of the bronchial tubes. The damaged alveoli are not able to exchange oxygen and carbon dioxide between the lungs and the blood. The bronchioles lose their elasticity and collapse when you exhale, trapping air in the lungs. The trapped air keeps fresh air and oxygen from entering the lungs.   Who is affected by COPD? Emphysema and chronic bronchitis affect approximately 16 million people in the [place], or close to 11 percent of the population.   Symptoms of COPD  Shortness of breath  Shortness of breath with mild exercise (walking, using the stairs, etc.)  Chronic, productive cough (with mucus)  A feeling of "tightness" in the chest  Wheezing   What causes COPD? The two primary causes of COPD are cigarette smoking and alpha1-antitrypsin (AAT) deficiency. Air pollution and occupational dusts may also contribute to COPD, especially when the person exposed to these substances is a cigarette smoker.  Cigarette smoke causes COPD by irritating the airways and creating inflammation that narrows the airways, making it more difficult to  breathe. Cigarette smoke also causes the cilia to stop working properly so mucus and trapped particles are not cleaned from the airways. As a result, chronic cough and excess mucus production develop, leading to chronic bronchitis.  In some people, chronic bronchitis and infections can lead to destruction of the small airways, or emphysema.  AAT deficiency, an inherited disorder, can also lead to emphysema. Alpha antitrypsin (AAT) is a protective material  produced in the liver and transported to the lungs to help combat inflammation. When there is not enough of the chemical AAT, the body is no longer protected from an enzyme in the white blood cells.   How is COPD diagnosed?  To diagnose COPD, the physician needs to know: Do you smoke?  Have you had chronic exposure to dust or air pollutants?  Do other members of your family have lung disease?  Are you short of breath?  Do you get short of breath with exercise?  Do you have chronic cough and/or wheezing?  Do you cough up excess mucus?  To help with the diagnosis, the physician will conduct a thorough physical exam which includes:  Listening to your lungs and heart  Checking your blood pressure and pulse  Examining your nose and throat  Checking your feet and ankles for swelling   Laboratory and other tests Several laboratory and other tests are needed to confirm a diagnosis of COPD. These tests may include:  Chest X-ray to look for lung changes that could be caused by COPD   Spirometry and pulmonary function tests (PFTs) to determine lung volume and air flow  Pulse oximetry to measure the saturation of oxygen in the blood  Arterial blood gases (ABGs) to determine the amount of oxygen and carbon dioxide in the blood  Exercise testing to determine if the oxygen level in the blood drops during exercise   Treatment In the beginning stages of COPD, there is minimal shortness of breath that may be noticed only during exercise. As the disease progresses, shortness of breath may worsen and you may need to wear an oxygen device.   To help control other symptoms of COPD, the following treatments and lifestyle changes may be prescribed.  Quitting smoking  Avoiding cigarette smoke and other irritants  Taking medications including: a. bronchodilators b. anti-inflammatory agents c. oxygen d. antibiotics  Maintaining a healthy diet  Following a structured exercise program such as pulmonary  rehabilitation Preventing respiratory infections  Controlling stress   If your COPD progresses, you may be eligible to be evaluated for lung volume reduction surgery or lung transplantation. You may also be eligible to participate in certain clinical trials (research studies). Ask your health care providers about studies being conducted in your hospital.   What is the outlook? Although COPD can not be cured, its symptoms can be treated and your quality of life can be improved. Your prognosis or outlook for the future will depend on how well your lungs are functioning, your symptoms, and how well you respond to and follow your treatment plan.

## 2021-08-28 DIAGNOSIS — H40013 Open angle with borderline findings, low risk, bilateral: Secondary | ICD-10-CM | POA: Diagnosis not present

## 2021-08-28 DIAGNOSIS — H353121 Nonexudative age-related macular degeneration, left eye, early dry stage: Secondary | ICD-10-CM | POA: Diagnosis not present

## 2021-08-28 DIAGNOSIS — H524 Presbyopia: Secondary | ICD-10-CM | POA: Diagnosis not present

## 2021-08-28 DIAGNOSIS — H25813 Combined forms of age-related cataract, bilateral: Secondary | ICD-10-CM | POA: Diagnosis not present

## 2021-08-28 DIAGNOSIS — H353112 Nonexudative age-related macular degeneration, right eye, intermediate dry stage: Secondary | ICD-10-CM | POA: Diagnosis not present

## 2021-09-04 ENCOUNTER — Telehealth: Payer: Self-pay | Admitting: Internal Medicine

## 2021-09-06 NOTE — Telephone Encounter (Signed)
Called and spoke with pt's spouse [name] who stated that pt has been having to use the albuterol inhaler more often than he should and she stated that she does want to know if Dr. Shearon Stalls can go ahead and send in an inhaler for pt to have as a maintenance inhaler to see if things will begin to get better for him.  Dr. Shearon Stalls, please advise.

## 2021-09-09 MED ORDER — TRELEGY ELLIPTA 100-62.5-25 MCG/ACT IN AEPB: 2.0000 | INHALATION_SPRAY | Freq: Every day | RESPIRATORY_TRACT | 3 refills | Status: DC

## 2021-09-09 MED ORDER — TRELEGY ELLIPTA 100-62.5-25 MCG/ACT IN AEPB: 1.0000 | INHALATION_SPRAY | Freq: Every day | RESPIRATORY_TRACT | 3 refills | Status: DC

## 2021-09-09 NOTE — Telephone Encounter (Signed)
I have called and spoke with the pharmacy and she is going to fill the once a day per the provider.   I have called the patient and left a message for this patient to call back with any questions. He was asked to call with any questions on the inhaler.

## 2021-09-11 DIAGNOSIS — Z0100 Encounter for examination of eyes and vision without abnormal findings: Secondary | ICD-10-CM | POA: Diagnosis not present

## 2021-09-19 ENCOUNTER — Other Ambulatory Visit: Payer: Self-pay | Admitting: Gastroenterology

## 2021-09-19 ENCOUNTER — Ambulatory Visit (INDEPENDENT_AMBULATORY_CARE_PROVIDER_SITE_OTHER): Payer: No Typology Code available for payment source | Admitting: Nurse Practitioner

## 2021-09-19 ENCOUNTER — Encounter: Payer: Self-pay | Admitting: Nurse Practitioner

## 2021-09-19 ENCOUNTER — Ambulatory Visit (INDEPENDENT_AMBULATORY_CARE_PROVIDER_SITE_OTHER): Payer: No Typology Code available for payment source

## 2021-09-19 ENCOUNTER — Telehealth: Payer: Self-pay | Admitting: Internal Medicine

## 2021-09-19 ENCOUNTER — Telehealth: Payer: Self-pay | Admitting: Nurse Practitioner

## 2021-09-19 ENCOUNTER — Other Ambulatory Visit: Payer: Self-pay

## 2021-09-19 VITALS — BP 112/60 | HR 79 | Wt 126.4 lb

## 2021-09-19 DIAGNOSIS — J3089 Other allergic rhinitis: Secondary | ICD-10-CM

## 2021-09-19 DIAGNOSIS — J309 Allergic rhinitis, unspecified: Secondary | ICD-10-CM

## 2021-09-19 DIAGNOSIS — T733XXA Exhaustion due to excessive exertion, initial encounter: Secondary | ICD-10-CM

## 2021-09-19 DIAGNOSIS — R1084 Generalized abdominal pain: Secondary | ICD-10-CM | POA: Diagnosis not present

## 2021-09-19 DIAGNOSIS — J441 Chronic obstructive pulmonary disease with (acute) exacerbation: Secondary | ICD-10-CM

## 2021-09-19 DIAGNOSIS — R0602 Shortness of breath: Secondary | ICD-10-CM | POA: Diagnosis not present

## 2021-09-19 DIAGNOSIS — R1310 Dysphagia, unspecified: Secondary | ICD-10-CM | POA: Diagnosis not present

## 2021-09-19 DIAGNOSIS — Z720 Tobacco use: Secondary | ICD-10-CM | POA: Diagnosis not present

## 2021-09-19 DIAGNOSIS — J439 Emphysema, unspecified: Secondary | ICD-10-CM | POA: Diagnosis not present

## 2021-09-19 DIAGNOSIS — R059 Cough, unspecified: Secondary | ICD-10-CM | POA: Diagnosis not present

## 2021-09-19 DIAGNOSIS — R140 Abdominal distension (gaseous): Secondary | ICD-10-CM | POA: Diagnosis not present

## 2021-09-19 MED ORDER — ALBUTEROL SULFATE (2.5 MG/3ML) 0.083% IN NEBU: 2.5000 mg | INHALATION_SOLUTION | Freq: Four times a day (QID) | RESPIRATORY_TRACT | 3 refills | Status: DC | PRN

## 2021-09-19 MED ORDER — AZITHROMYCIN 250 MG PO TABS: ORAL_TABLET | ORAL | 0 refills | Status: DC

## 2021-09-19 MED ORDER — FLUTICASONE PROPIONATE 50 MCG/ACT NA SUSP: 2.0000 | Freq: Every day | NASAL | 2 refills | Status: DC

## 2021-09-19 MED ORDER — LEVOCETIRIZINE DIHYDROCHLORIDE 5 MG PO TABS: 5.0000 mg | ORAL_TABLET | Freq: Every evening | ORAL | 1 refills | Status: DC

## 2021-09-19 MED ORDER — PREDNISONE 10 MG PO TABS
ORAL_TABLET | ORAL | 0 refills | Status: DC
Start: 2021-09-19 — End: 2022-04-22

## 2021-09-19 MED ORDER — ALBUTEROL SULFATE (2.5 MG/3ML) 0.083% IN NEBU
2.5000 mg | INHALATION_SOLUTION | Freq: Once | RESPIRATORY_TRACT | Status: AC
  Administered 2021-09-19: 2.5 mg via RESPIRATORY_TRACT

## 2021-09-19 NOTE — Progress Notes (Signed)
Attempted to notify pt that CXR showed chronic emphysematous changes with hyperinflated lungs. No evidence of superimposed infection. Continue plan as discussed earlier. Notify of any worsening symptoms. No answer. Left VM

## 2021-09-19 NOTE — Assessment & Plan Note (Signed)
Suspect contributing to cough and flare. Postnasal drainage/allergy control with Xyzal and flonase.  ?

## 2021-09-19 NOTE — Assessment & Plan Note (Addendum)
Worsening SOB and congested cough, occasionally productive with clear to white sputum. Will treat for AECOPD/bronchitis with prednisone taper and z pack. Albuterol neb x1 in office. Started triple therapy yesterday - continue Trelegy. Educated on importance of oral hygiene after use. Sent rx for albuterol neb and machine for as needed use. Mucolytic therapy. Delsym and chlortab for cough suppression to help with disrupted sleep. CXR today to r/o superimposed infection. ? ?Patient Instructions  ?-Continue Albuterol inhaler 2 puffs or 3 mL neb every 6 hours as needed for shortness of breath or wheezing. Notify if symptoms persist despite rescue inhaler/neb use. ?-Continue Trelegy 1 puff daily. Brush tongue and rinse mouth afterwards.  ? ?-Xyzal 5 mg daily for allergies  ?-Flonase nasal spray 2 sprays each nostril daily ?-Chlortab 4 mg over the counter At bedtime  ?-Delsym 2 tsp Twice daily for cough over the counter  ?-Prednisone taper. 4 tabs for 2 days, then 3 tabs for 2 days, 2 tabs for 2 days, then 1 tab for 2 days, then stop. Take in AM with food ?-Z pack. Take 2 tabs on day one followed 1 tab for four additional days ? ?Chest x ray today. We will notify you of any abnormal results.  ? ?Notify if worsening breathlessness, cough, mucus production, fatigue, or wheezing occurs.  ? ?Follow up in 2-3 weeks with Dr. Shearon Stalls. If symptoms do not improve or worsen, please contact office for sooner follow up or seek emergency care. ? ? ?

## 2021-09-19 NOTE — Assessment & Plan Note (Signed)
Quit cold [place] in January. Counseled to remain smoke free.  ?

## 2021-09-19 NOTE — Assessment & Plan Note (Signed)
Persistent coughing at night contributing - advised to start chlortab and delsym for cough suppression.  ?

## 2021-09-19 NOTE — Patient Instructions (Addendum)
-  Continue Albuterol inhaler 2 puffs or 3 mL neb every 6 hours as needed for shortness of breath or wheezing. Notify if symptoms persist despite rescue inhaler/neb use. ?-Continue Trelegy 1 puff daily. Brush tongue and rinse mouth afterwards.  ? ?-Xyzal 5 mg daily for allergies  ?-Flonase nasal spray 2 sprays each nostril daily ?-Chlortab 4 mg over the counter At bedtime  ?-Delsym 2 tsp Twice daily for cough over the counter  ?-Mucinex 600 mg Twice daily  ?-Prednisone taper. 4 tabs for 2 days, then 3 tabs for 2 days, 2 tabs for 2 days, then 1 tab for 2 days, then stop. Take in AM with food ?-Z pack. Take 2 tabs on day one followed 1 tab for four additional days ? ?Chest x ray today. We will notify you of any abnormal results.  ? ?Notify if worsening breathlessness, cough, mucus production, fatigue, or wheezing occurs.  ? ?Follow up in 2-3 weeks with Dr. Shearon Stalls. If symptoms do not improve or worsen, please contact office for sooner follow up or seek emergency care. ?

## 2021-09-19 NOTE — Telephone Encounter (Signed)
Spoke with pt's wife [name] (per DPR) who states pt has coughing spells that usually happen at night. [name] states that rescue albuterol inhaler does not seem to help but pt has tried albuterol nebulizer which seems to help pt with cough. Pt did state Trelegy yesterday. Pt has been scheduled for acute visit with Joellen Jersey today but wife is unsure if pt will be able to make it. Coughing spells seem to be induced by increased stress in pt.  ? ?When [name] calls back please confirm appointment on 09/19/21 or 09/23/21. Thank you  ? ?Dr. Shearon Stalls please advise on coughing spells.  ?

## 2021-09-19 NOTE — Addendum Note (Signed)
Addended by: Vanetta Shawl on: 09/19/2021 03:26 PM ? ? Modules accepted: Orders ? ?

## 2021-09-19 NOTE — Progress Notes (Signed)
@Patient  ID: Joshua Reed, male    DOB: March 14, 1946, 76 y.o.   MRN: [id number]  No chief complaint on file.   Referring provider: Lawerance Cruel, MD  HPI: 76 year old male, former smoker (60 pack years) followed for COPD with chronic bronchitis and emphysema. He is a patient of Dr. Mauricio Po and last seen in office on 08/27/2021. He is followed by the lung cancer screening program with last LDCT chest on 03/11/2021. Past medical history significant for seizure disorder, ETOH abuse, macular degeneration, severe protein calorie malnutrition, tobacco abuse, HLD.   TEST/EVENTS:  10/20/2016 Spirometry: FVC 1.8 (45), FEV1 0.9 (29), ratio 47. Very severe obstructive airway disease.  03/08/2021 LDCT chest: atherosclerosis. Centrilobular emphysema. Biapical pleuroparenchymal scarring. Calcified RLL granuloma. No nodules present - Lung-RADS1   08/27/2021: OV with Dr. Shearon Stalls. Evidence of emphysema on CT chest. Experiences DOE; able to complete daily activities but difficulties lifting things. Using albuterol Twice daily. Not on any maintenance therapies - wanted to hold off. PFTs ordered. Recently quit smoking 07/2021.   09/19/2021: Today - acute visit Patient presents today with wife for worsening in his breathing. He contacted the office around a week ago and decided he wanted to be started on a daily maintenance inhaler so Trelegy was sent to his pharmacy. He started this yesterday; however, last night he had a scary episode with terrible shortness of breath. He used his daughter's nebulizer machine and felt some relief afterwards. Today, he reports that his breathing is slightly better but he still has more shortness of breath than he normally does. He hasn't noticed a lot of wheezing but has some noisy breathing every now and then. He has been coughing frequently. He reports that he occasionally gets up some clear sputum but other times it's nonproductive and congested. Using albuterol a few times a day.  Allergy symptoms have also flared over the past few weeks with nasal drainage and a tickle in his throat. He's tried multiple OTC allergy medicines in the past without much relief.   Allergies  Allergen Reactions   Acyclovir And Related Hives    Immunization History  Administered Date(s) Administered   Influenza, High Dose Seasonal PF 07/12/2014, 05/15/2017, 07/29/2018, 05/16/2019   PFIZER(Purple Top)SARS-COV-2 Vaccination 09/25/2019, 10/19/2019   Pneumococcal Conjugate-13 01/10/2015   Pneumococcal Polysaccharide-23 04/14/2013   Tdap 05/24/2014   Zoster, Live 04/14/2013    Past Medical History:  Diagnosis Date   Alcohol abuse    COPD (chronic obstructive pulmonary disease) (HCC)    Hyperlipidemia    Low vitamin B12 level    Macular degeneration    Seizure disorder (HCC) 2205   ALCOHOL RELATED   Seizures (HCC)    Vitamin D deficiency     Tobacco History: Social History   Tobacco Use  Smoking Status Former   Packs/day: 0.50   Types: Cigarettes   Quit date: 08/06/2021   Years since quitting: 0.1  Smokeless Tobacco Never   Counseling given: Not Answered   Outpatient Medications Prior to Visit  Medication Sig Dispense Refill   albuterol (VENTOLIN HFA) 108 (90 Base) MCG/ACT inhaler Inhale into the lungs every 6 (six) hours as needed.     aspirin EC 81 MG tablet Take 1 tablet (81 mg total) by mouth daily. Swallow whole. 90 tablet 3   Coenzyme Q10 (CO Q 10 PO) Take 1 tablet by mouth daily.     Cyanocobalamin (B-12 IJ) Takes injection as directed bi weekly     Fluticasone-Umeclidin-Vilant (TRELEGY ELLIPTA)  100-62.5-25 MCG/ACT AEPB Inhale 1 puff into the lungs daily. 1 each 3   levETIRAcetam (KEPPRA) 1000 MG tablet Take 1 tablet (1,000 mg total) by mouth 2 (two) times daily. 180 tablet 0   Multiple Vitamin (MULTIVITAMIN WITH MINERALS) TABS tablet Take 1 tablet by mouth daily.     Omega-3 Fatty Acids (FISH OIL PO) Take 1 capsule by mouth daily.     Vitamin D, Ergocalciferol,  (DRISDOL) 50000 UNITS CAPS capsule Take 50,000 Units by mouth once a week.  0   No facility-administered medications prior to visit.     Review of Systems:   Constitutional: No weight loss or gain, night sweats, fevers, chills. +fatigue HEENT: No headaches, difficulty swallowing, tooth/dental problems, or sore throat. No sneezing, itching, ear ache. +postnasal drip; tickle in throat CV:  No chest pain, orthopnea, PND, swelling in lower extremities, anasarca, dizziness, palpitations, syncope Resp: +shortness of breath with exertion; frequent congested cough, occasionally productive with clear sputum; noisy breathing at times. No excess mucus or change in color of mucus. No hemoptysis. No wheezing.  No chest wall deformity GI:  No heartburn, indigestion, abdominal pain, nausea, vomiting, diarrhea, change in bowel habits, loss of appetite, bloody stools.  GU: No dysuria, change in color of urine, urgency or frequency.  No flank pain, no hematuria  Skin: No rash, lesions, ulcerations MSK:  No joint pain or swelling.  No decreased range of motion.  No back pain. Neuro: No dizziness or lightheadedness.  Psych: No depression or anxiety. Mood stable.     Physical Exam:  BP 112/60    Pulse 79    Wt 126 lb 6.4 oz (57.3 kg)    SpO2 95%    BMI 19.22 kg/m   GEN: Pleasant, interactive, well-appearing; in no acute distress. HEENT:  Normocephalic and atraumatic. EACs patent bilaterally. TM pearly gray with present light reflex bilaterally. PERRLA. Sclera white. Nasal turbinates pink, moist and patent bilaterally. Clear rhinorrhea present. Oropharynx erythematous and moist, without exudate or edema. No lesions, ulcerations  NECK:  Supple w/ fair ROM. No JVD present. Normal carotid impulses w/o bruits. Thyroid symmetrical with no goiter or nodules palpated. No lymphadenopathy.   CV: RRR, no m/r/g, no peripheral edema. Pulses intact, +2 bilaterally. No cyanosis, pallor or clubbing. PULMONARY:  Unlabored,  regular breathing. Scattered wheezes bilaterally A&P. No accessory muscle use. No dullness to percussion. GI: BS present and normoactive. Soft, non-tender to palpation. No organomegaly or masses detected. No CVA tenderness. MSK: No erythema, warmth or tenderness. Cap refil <2 sec all extrem. No deformities or joint swelling noted.  Neuro: A/Ox3. No focal deficits noted.   Skin: Warm, no lesions or rashe Psych: Normal affect and behavior. Judgement and thought content appropriate.     Lab Results:  CBC    Component Value Date/Time   WBC 8.6 07/03/2014 0511   RBC 3.78 (L) 07/03/2014 0511   HGB 11.4 (L) 07/03/2014 0511   HCT 34.6 (L) 07/03/2014 0511   PLT 278 07/03/2014 0511   MCV 91.5 07/03/2014 0511   MCH 30.2 07/03/2014 0511   MCHC 32.9 07/03/2014 0511   RDW 17.9 (H) 07/03/2014 0511   LYMPHSABS 1.5 06/19/2014 0514   MONOABS 1.1 (H) 06/19/2014 0514   EOSABS 0.1 06/19/2014 0514   BASOSABS 0.0 06/19/2014 0514    BMET    Component Value Date/Time   NA 134 (L) 07/03/2014 0511   K 4.2 07/03/2014 0511   CL 100 07/03/2014 0511   CO2 25 07/03/2014 0511  GLUCOSE 80 07/03/2014 0511   BUN 7 07/03/2014 0511   CREATININE 0.59 07/03/2014 0511   CALCIUM 7.8 (L) 07/03/2014 0511   GFRNONAA >90 07/03/2014 0511   GFRAA >90 07/03/2014 0511    BNP No results found for: BNP   Imaging:  No results found.  albuterol (PROVENTIL) (2.5 MG/3ML) 0.083% nebulizer solution 2.5 mg     Date Action Dose Route User   09/19/2021 1441 Given 2.5 mg Nebulization Lonna Duval M, CMA       No flowsheet data found.  No results found for: NITRICOXIDE      Assessment & Plan:   COPD with acute exacerbation ([place]) Worsening SOB and congested cough, occasionally productive with clear to white sputum. Will treat for AECOPD/bronchitis with prednisone taper and z pack. Albuterol neb x1 in office. Started triple therapy yesterday - continue Trelegy. Educated on importance of oral hygiene after use.  Sent rx for albuterol neb and machine for as needed use. Mucolytic therapy. Delsym and chlortab for cough suppression to help with disrupted sleep. CXR today to r/o superimposed infection.  Patient Instructions  -Continue Albuterol inhaler 2 puffs or 3 mL neb every 6 hours as needed for shortness of breath or wheezing. Notify if symptoms persist despite rescue inhaler/neb use. -Continue Trelegy 1 puff daily. Brush tongue and rinse mouth afterwards.   -Xyzal 5 mg daily for allergies  -Flonase nasal spray 2 sprays each nostril daily -Chlortab 4 mg over the counter At bedtime  -Delsym 2 tsp Twice daily for cough over the counter  -Prednisone taper. 4 tabs for 2 days, then 3 tabs for 2 days, 2 tabs for 2 days, then 1 tab for 2 days, then stop. Take in AM with food -Z pack. Take 2 tabs on day one followed 1 tab for four additional days  Chest x ray today. We will notify you of any abnormal results.   Notify if worsening breathlessness, cough, mucus production, fatigue, or wheezing occurs.   Follow up in 2-3 weeks with Dr. Shearon Stalls. If symptoms do not improve or worsen, please contact office for sooner follow up or seek emergency care.    Allergic rhinitis Suspect contributing to cough and flare. Postnasal drainage/allergy control with Xyzal and flonase.   Tobacco abuse Quit cold [place] in January. Counseled to remain smoke free.   Fatigue Persistent coughing at night contributing - advised to start chlortab and delsym for cough suppression.    I spent 35 minutes of dedicated to the care of this patient on the date of this encounter to include pre-visit review of records, face-to-face time with the patient discussing conditions above, post visit ordering of testing, clinical documentation with the electronic health record, making appropriate referrals as documented, and communicating necessary findings to members of the patients care team.  Clayton Bibles, NP 09/19/2021  Pt aware and  understands NP's role. Marland Kitchen

## 2021-09-19 NOTE — Telephone Encounter (Signed)
Seen in [place] today with Katie. Nothing further needed at this time.  ?

## 2021-09-20 NOTE — Telephone Encounter (Signed)
Yes, still advised he take the abx for AECOPD given his productive cough. Thanks.

## 2021-09-20 NOTE — Telephone Encounter (Signed)
Spoke with pt and reviewed CXR results as dictated by [facility]. Pt stated understanding. Pt is asking since there was not any infection weather or not he needed to take ABT. Katie please advise.  ?

## 2021-09-20 NOTE — Telephone Encounter (Signed)
Joshua Reed wife is returning phone call. Joshua Reed phone number is [phone].  ?

## 2021-09-23 ENCOUNTER — Ambulatory Visit: Payer: No Typology Code available for payment source | Admitting: Internal Medicine

## 2021-09-23 DIAGNOSIS — E7800 Pure hypercholesterolemia, unspecified: Secondary | ICD-10-CM | POA: Diagnosis not present

## 2021-09-23 DIAGNOSIS — Z79899 Other long term (current) drug therapy: Secondary | ICD-10-CM | POA: Diagnosis not present

## 2021-09-23 DIAGNOSIS — D510 Vitamin B12 deficiency anemia due to intrinsic factor deficiency: Secondary | ICD-10-CM | POA: Diagnosis not present

## 2021-09-23 DIAGNOSIS — E559 Vitamin D deficiency, unspecified: Secondary | ICD-10-CM | POA: Diagnosis not present

## 2021-09-23 DIAGNOSIS — E538 Deficiency of other specified B group vitamins: Secondary | ICD-10-CM | POA: Diagnosis not present

## 2021-09-23 DIAGNOSIS — Z125 Encounter for screening for malignant neoplasm of prostate: Secondary | ICD-10-CM | POA: Diagnosis not present

## 2021-09-23 NOTE — Telephone Encounter (Signed)
Spoke with [name] (per [facility]) and reviewed Katie's recommendation. [name] stated understanding. Nothing further needed at this time.  ?

## 2021-09-24 ENCOUNTER — Ambulatory Visit
Admission: RE | Admit: 2021-09-24 | Discharge: 2021-09-24 | Disposition: A | Discharge status: Home / Self Care | Payer: No Typology Code available for payment source | Source: Ambulatory Visit | Attending: Gastroenterology | Admitting: Gastroenterology

## 2021-09-24 ENCOUNTER — Other Ambulatory Visit: Payer: Self-pay

## 2021-09-24 DIAGNOSIS — R1310 Dysphagia, unspecified: Secondary | ICD-10-CM | POA: Diagnosis not present

## 2021-09-24 DIAGNOSIS — K219 Gastro-esophageal reflux disease without esophagitis: Secondary | ICD-10-CM | POA: Diagnosis not present

## 2021-09-24 DIAGNOSIS — K225 Diverticulum of esophagus, acquired: Secondary | ICD-10-CM | POA: Diagnosis not present

## 2021-09-24 NOTE — Progress Notes (Signed)
Called and spoke with patient's wife, [name], ([place]), provided results/recommendations per Mollie Germany NP.  She verbalized understanding.  Nothing further needed.

## 2021-09-25 DIAGNOSIS — L812 Freckles: Secondary | ICD-10-CM | POA: Diagnosis not present

## 2021-09-25 DIAGNOSIS — Z85828 Personal history of other malignant neoplasm of skin: Secondary | ICD-10-CM | POA: Diagnosis not present

## 2021-09-25 DIAGNOSIS — L570 Actinic keratosis: Secondary | ICD-10-CM | POA: Diagnosis not present

## 2021-09-25 DIAGNOSIS — L821 Other seborrheic keratosis: Secondary | ICD-10-CM | POA: Diagnosis not present

## 2021-09-25 DIAGNOSIS — L858 Other specified epidermal thickening: Secondary | ICD-10-CM | POA: Diagnosis not present

## 2021-10-21 ENCOUNTER — Telehealth: Payer: Self-pay | Admitting: Internal Medicine

## 2021-10-21 NOTE — Telephone Encounter (Signed)
Please ensure that he is rinsing his mouth out after each use of the trelegy inhaler.  ? ?I would recommend he schedule an acute visit with an APP to be evaluated for thrush and possibly switching his inhaler therapy to breztri if he is experiencing thrush with the dry powder inhaler.  ? ?Thanks, ?JD ?

## 2021-10-21 NOTE — Telephone Encounter (Signed)
Spoke with pt who states at this time he no longer believes he has trush. He is currently out of town with his wife and will not be returning for 2 weeks. Pt advised that it symptoms worsen in any way he needs to go to a UC for evaluation. Pt stated understanding. Nothing further needed at this time.  ?

## 2021-10-21 NOTE — Telephone Encounter (Signed)
I called and he feels that his tongue is sore and has redness on tongue. He has not been using the Trelegy for 2 days and he feels that he may have some white patches in his mouth on the side. He feels that he is breathing harder in the last few days at night.He does not think he bit his tongue but he seen a couple of white places in his mouth. Patient wants to know if he needs something for possible thrush and does he need to start back on his Trelegy today? Please advise.  ?

## 2021-11-11 DIAGNOSIS — D510 Vitamin B12 deficiency anemia due to intrinsic factor deficiency: Secondary | ICD-10-CM | POA: Diagnosis not present

## 2021-11-11 DIAGNOSIS — I739 Peripheral vascular disease, unspecified: Secondary | ICD-10-CM | POA: Diagnosis not present

## 2021-11-11 DIAGNOSIS — G40909 Epilepsy, unspecified, not intractable, without status epilepticus: Secondary | ICD-10-CM | POA: Diagnosis not present

## 2021-11-11 DIAGNOSIS — E538 Deficiency of other specified B group vitamins: Secondary | ICD-10-CM | POA: Diagnosis not present

## 2021-11-11 DIAGNOSIS — Z79899 Other long term (current) drug therapy: Secondary | ICD-10-CM | POA: Diagnosis not present

## 2021-11-11 DIAGNOSIS — Z125 Encounter for screening for malignant neoplasm of prostate: Secondary | ICD-10-CM | POA: Diagnosis not present

## 2021-11-11 DIAGNOSIS — E7800 Pure hypercholesterolemia, unspecified: Secondary | ICD-10-CM | POA: Diagnosis not present

## 2021-11-11 DIAGNOSIS — I700 Atherosclerosis of aorta: Secondary | ICD-10-CM | POA: Diagnosis not present

## 2021-11-11 DIAGNOSIS — Z0000 Encounter for general adult medical examination without abnormal findings: Secondary | ICD-10-CM | POA: Diagnosis not present

## 2021-11-11 DIAGNOSIS — E559 Vitamin D deficiency, unspecified: Secondary | ICD-10-CM | POA: Diagnosis not present

## 2021-11-11 DIAGNOSIS — F1021 Alcohol dependence, in remission: Secondary | ICD-10-CM | POA: Diagnosis not present

## 2021-11-26 ENCOUNTER — Ambulatory Visit: Payer: No Typology Code available for payment source | Admitting: Internal Medicine

## 2021-12-19 DIAGNOSIS — L570 Actinic keratosis: Secondary | ICD-10-CM | POA: Diagnosis not present

## 2021-12-19 DIAGNOSIS — D485 Neoplasm of uncertain behavior of skin: Secondary | ICD-10-CM | POA: Diagnosis not present

## 2021-12-19 DIAGNOSIS — Z85828 Personal history of other malignant neoplasm of skin: Secondary | ICD-10-CM | POA: Diagnosis not present

## 2021-12-19 DIAGNOSIS — D0439 Carcinoma in situ of skin of other parts of face: Secondary | ICD-10-CM | POA: Diagnosis not present

## 2021-12-31 DIAGNOSIS — M65312 Trigger thumb, left thumb: Secondary | ICD-10-CM | POA: Diagnosis not present

## 2022-01-01 DIAGNOSIS — Z9189 Other specified personal risk factors, not elsewhere classified: Secondary | ICD-10-CM | POA: Diagnosis not present

## 2022-01-01 DIAGNOSIS — Z681 Body mass index (BMI) 19 or less, adult: Secondary | ICD-10-CM | POA: Diagnosis not present

## 2022-01-01 DIAGNOSIS — R636 Underweight: Secondary | ICD-10-CM | POA: Diagnosis not present

## 2022-01-01 DIAGNOSIS — G40909 Epilepsy, unspecified, not intractable, without status epilepticus: Secondary | ICD-10-CM | POA: Diagnosis not present

## 2022-03-10 ENCOUNTER — Ambulatory Visit
Admission: RE | Admit: 2022-03-10 | Discharge: 2022-03-10 | Disposition: A | Discharge status: Home / Self Care | Payer: No Typology Code available for payment source | Source: Ambulatory Visit | Attending: Acute Care | Admitting: Acute Care

## 2022-03-10 DIAGNOSIS — Z87891 Personal history of nicotine dependence: Secondary | ICD-10-CM

## 2022-03-10 DIAGNOSIS — F17210 Nicotine dependence, cigarettes, uncomplicated: Secondary | ICD-10-CM | POA: Diagnosis not present

## 2022-03-12 ENCOUNTER — Telehealth: Payer: Self-pay | Admitting: Acute Care

## 2022-03-12 DIAGNOSIS — R911 Solitary pulmonary nodule: Secondary | ICD-10-CM

## 2022-03-12 DIAGNOSIS — Z87891 Personal history of nicotine dependence: Secondary | ICD-10-CM

## 2022-03-12 NOTE — Telephone Encounter (Signed)
I have called the patient with the results of his low dose Ct Chest. I explained that his scan was read as a Lung  RADS 3, nodules that are probably benign findings, short term follow up suggested: includes nodules with a low likelihood of becoming a clinically active cancer. Radiology recommends a 6 month repeat LDCT follow up. There is a new 5.7 mm nodule that was not present on the previous scan.  Patient's wife called back after I got off the phone with the patient. He has a friend who had an abnormal scan and then had died within 6 months. He is very anxious. He would prefer to do a 3 month follow up. He said if his insurance does not approve this, he will pay out of pocket at [facility].   Langley Gauss, please place order for 3 month follow up scan and fax results to PCP. Thanks so much.  I will send patient DPR paperwork for his wife to receive information about his healthcare. He gave me verbal permission when I spoke with him on the phone today, but we will get signatures and paperwork and scan this into Epic.

## 2022-03-12 NOTE — Telephone Encounter (Signed)
I have attempted to call the patient with the results of their  Low Dose CT Chest Lung cancer screening scan. There was no answer. I have left a HIPPA compliant VM requesting the patient call the office for the scan results. I included the office contact information in the message. We will await his return call. If no return call we will continue to call until patient is contacted.   

## 2022-03-13 ENCOUNTER — Encounter: Payer: Self-pay | Admitting: Acute Care

## 2022-03-13 NOTE — Telephone Encounter (Signed)
CT results faxed to PCP with follow up plans included. Order placed for 3 mth nodule f/u LDCT.

## 2022-03-13 NOTE — Telephone Encounter (Signed)
See other telephone note from 03/12/22

## 2022-03-13 NOTE — Progress Notes (Deleted)
August 24,2023 [facility] [address], [place], [place]   Mr. Goostree, Please complete this Designated Party Release Form and return to [place] , so that we can get this scanned into the [place] . This will allow [place] to share your medical information with your wife.  Please return this in the envelope enclosed so that it comes to the screening program. We will ensure it gets scanned into the system for all future reference.   Thank you so much,  Sincerely.  The Lung Screening  Team

## 2022-04-02 ENCOUNTER — Encounter (HOSPITAL_BASED_OUTPATIENT_CLINIC_OR_DEPARTMENT_OTHER): Payer: Self-pay | Admitting: Cardiology

## 2022-04-02 ENCOUNTER — Ambulatory Visit (INDEPENDENT_AMBULATORY_CARE_PROVIDER_SITE_OTHER): Payer: No Typology Code available for payment source | Admitting: Cardiology

## 2022-04-02 VITALS — BP 120/70 | HR 73 | Ht 68.0 in | Wt 128.2 lb

## 2022-04-02 DIAGNOSIS — Z7189 Other specified counseling: Secondary | ICD-10-CM

## 2022-04-02 DIAGNOSIS — I700 Atherosclerosis of aorta: Secondary | ICD-10-CM | POA: Diagnosis not present

## 2022-04-02 DIAGNOSIS — E7800 Pure hypercholesterolemia, unspecified: Secondary | ICD-10-CM | POA: Diagnosis not present

## 2022-04-02 DIAGNOSIS — Z79899 Other long term (current) drug therapy: Secondary | ICD-10-CM

## 2022-04-02 DIAGNOSIS — Z87891 Personal history of nicotine dependence: Secondary | ICD-10-CM

## 2022-04-02 DIAGNOSIS — I2584 Coronary atherosclerosis due to calcified coronary lesion: Secondary | ICD-10-CM

## 2022-04-02 DIAGNOSIS — I2510 Atherosclerotic heart disease of native coronary artery without angina pectoris: Secondary | ICD-10-CM | POA: Insufficient documentation

## 2022-04-02 MED ORDER — ATORVASTATIN CALCIUM 10 MG PO TABS: 10.0000 mg | ORAL_TABLET | Freq: Every day | ORAL | 3 refills | Status: DC
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
## 2022-04-02 NOTE — Patient Instructions (Signed)
Medication Instructions:  START: Atorvastatin 10 mg daily   *If you need a refill on your cardiac medications before your next appointment, please call your pharmacy*   Lab Work: Your provider has recommended lab work December, 2023 (fasting lipid, LFT). Please have this collected at [facility] [place]. The lab is open 8:00 am - 4:30 pm. Please avoid 12:00p - 1:00p for lunch hour. You do not need an appointment. Please go to [address] [place], [place] [address]. This is in the Primary Care office on the 3rd floor, let them know you are there for blood work and they will direct you to the lab.  If you have labs (blood work) drawn today and your tests are completely normal, you will receive your results only by: [place] (if you have MyChart) OR A paper copy in the mail If you have any lab test that is abnormal or we need to change your treatment, we will call you to review the results.   Testing/Procedures: None ordered today    Follow-Up: At [facility], you and your health needs are our priority.  As part of our continuing mission to provide you with exceptional heart care, we have created designated Provider Care Teams.  These Care Teams include your primary Cardiologist (physician) and Advanced Practice Providers (APPs -  Physician Assistants and Nurse Practitioners) who all work together to provide you with the care you need, when you need it.  We recommend signing up for the patient portal called "MyChart".  Sign up information is provided on this After Visit Summary.  MyChart is used to connect with patients for Virtual Visits (Telemedicine).  Patients are able to view lab/test results, encounter notes, upcoming appointments, etc.  Non-urgent messages can be sent to your provider as well.   To learn more about what you can do with MyChart, go to [email or web address].    Your next appointment:   1 year(s)  The format for your  next appointment:   In Person  Provider:   Buford Dresser, MD

## 2022-04-21 ENCOUNTER — Encounter (HOSPITAL_BASED_OUTPATIENT_CLINIC_OR_DEPARTMENT_OTHER): Payer: Self-pay | Admitting: Cardiology

## 2022-04-21 NOTE — Patient Instructions (Signed)
Below is our plan:  We will continue levetiracetam '1000mg'$  twice daily. I do not recommend stopping the medication at this time. Please use caution with driving and if not feeling well. Please make sure you are staying well hydrated.   Please make sure you are staying well hydrated. I recommend 50-60 ounces daily. Well balanced diet and regular exercise encouraged. Consistent sleep schedule with 6-8 hours recommended.   Please continue follow up with care team as directed.   Follow up with me in 1 year   You may receive a survey regarding today's visit. I encourage you to leave honest feed back as I do use this information to improve patient care. Thank you for seeing me today!
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
## 2022-04-22 ENCOUNTER — Ambulatory Visit (INDEPENDENT_AMBULATORY_CARE_PROVIDER_SITE_OTHER): Payer: No Typology Code available for payment source | Admitting: Family Medicine

## 2022-04-22 ENCOUNTER — Encounter: Payer: Self-pay | Admitting: Family Medicine

## 2022-04-22 ENCOUNTER — Telehealth: Payer: Self-pay | Admitting: Family Medicine

## 2022-04-22 VITALS — BP 121/61 | HR 67 | Ht 68.0 in | Wt 126.0 lb

## 2022-04-22 DIAGNOSIS — G40909 Epilepsy, unspecified, not intractable, without status epilepticus: Secondary | ICD-10-CM

## 2022-04-22 NOTE — Telephone Encounter (Signed)
Wife has called back , she states that she witnessed the episode that pt had and she would like an opportunity to discuss that further with Amy,NP when she has a moment to call.  The message from [place], [place] : overall the apt went well. There was nothing that seemed concerning. Was relayed, wife is still asking for a call from Amy, NP when she is available so that she can explain it was not a seizure that pt had.  Wife is wanting to inform Amy that the hospital determined that pt's blood pressure was low and that pt was dehydrated.  Wife is asking for 1 to 2 mins to explain/relay this to Amy, NP

## 2022-04-22 NOTE — Telephone Encounter (Signed)
Called the wife back. There was no answer. Left a detailed message advising we received her message about wanting a call about today's visit. Advised overall the apt went well. There was nothing that seemed concerning. We will continue his current medications as prescribed and he will follow up yearly.  Instructed to call back if there were any other questions that she had.  If unable to answer phone at her return call please ask if there is specifically any message she is wanting to relay to amy or if she had specific questions to ask amy. That way I can get her answers prior to calling back if she does.

## 2022-04-22 NOTE — Telephone Encounter (Signed)
Pt wife is calling. Stated she would like to talk to Amy about the visit today.

## 2022-04-23 ENCOUNTER — Ambulatory Visit (INDEPENDENT_AMBULATORY_CARE_PROVIDER_SITE_OTHER): Payer: No Typology Code available for payment source

## 2022-04-23 DIAGNOSIS — I2510 Atherosclerotic heart disease of native coronary artery without angina pectoris: Secondary | ICD-10-CM

## 2022-04-23 DIAGNOSIS — Z87891 Personal history of nicotine dependence: Secondary | ICD-10-CM | POA: Diagnosis not present

## 2022-04-23 DIAGNOSIS — Z136 Encounter for screening for cardiovascular disorders: Secondary | ICD-10-CM | POA: Diagnosis not present

## 2022-04-23 DIAGNOSIS — I700 Atherosclerosis of aorta: Secondary | ICD-10-CM

## 2022-04-23 LAB — LEVETIRACETAM LEVEL: Levetiracetam Lvl: 41 ug/mL — ABNORMAL HIGH (ref 10.0–40.0)

## 2022-04-28 ENCOUNTER — Telehealth: Payer: Self-pay | Admitting: Family Medicine

## 2022-04-28 DIAGNOSIS — G40909 Epilepsy, unspecified, not intractable, without status epilepticus: Secondary | ICD-10-CM

## 2022-04-28 MED ORDER — LEVETIRACETAM 1000 MG PO TABS: 1000.0000 mg | ORAL_TABLET | Freq: Two times a day (BID) | ORAL | 3 refills | Status: DC

## 2022-04-28 NOTE — Telephone Encounter (Signed)
I have called and spoke with Mrs Kishimoto. She tells me that Mr Lassen was upset after his visit with me last week. He was concerned that I felt event in 07/2021 was a seizure. She recalled event and wanted to express to me that it was not typical of his previous seizure events. She feels confident event was syncopal. I have reiterated that we understand that it may have been a syncopal event and have documented as such, however, it is my responsibility to evaluate the situation as he had not been in for follow up in over three years. She adamantly denies that it has been three years since his last visit with [facility]. I have reviewed dates of last visit and refills. Last refill provided by our office was 03/2021 for 90 day supply. Refills could have come from PCP. I explained that our priority was his safety. I have encouraged him to continue levetiracetam as prescribed and advised annual follow up. He may follow up with PCP for refills, if willing. She verbalizes understanding and thanked me for the call.    This was Amy's call when she reached out to the wife

## 2022-04-28 NOTE — Telephone Encounter (Signed)
Pt's request refill for levETIRAcetam (KEPPRA) 1000 MG tablet at [facility]

## 2022-04-28 NOTE — Telephone Encounter (Signed)
Pt's wife, Doc Mandala would like a call back to respond to previous message.

## 2022-04-28 NOTE — Telephone Encounter (Signed)
E-scribed refill 

## 2022-04-29 DIAGNOSIS — M65312 Trigger thumb, left thumb: Secondary | ICD-10-CM | POA: Diagnosis not present

## 2022-05-20 ENCOUNTER — Encounter (HOSPITAL_COMMUNITY): Payer: Self-pay | Admitting: Cardiology

## 2022-06-10 ENCOUNTER — Ambulatory Visit
Admission: RE | Admit: 2022-06-10 | Discharge: 2022-06-10 | Disposition: A | Discharge status: Home / Self Care | Payer: No Typology Code available for payment source | Source: Ambulatory Visit | Attending: Acute Care | Admitting: Acute Care

## 2022-06-10 DIAGNOSIS — J439 Emphysema, unspecified: Secondary | ICD-10-CM | POA: Diagnosis not present

## 2022-06-10 DIAGNOSIS — I700 Atherosclerosis of aorta: Secondary | ICD-10-CM | POA: Diagnosis not present

## 2022-06-10 DIAGNOSIS — R911 Solitary pulmonary nodule: Secondary | ICD-10-CM | POA: Diagnosis not present

## 2022-06-10 DIAGNOSIS — Z87891 Personal history of nicotine dependence: Secondary | ICD-10-CM

## 2022-06-13 ENCOUNTER — Other Ambulatory Visit: Payer: Self-pay

## 2022-06-13 ENCOUNTER — Telehealth: Payer: Self-pay | Admitting: Acute Care

## 2022-06-13 DIAGNOSIS — Z122 Encounter for screening for malignant neoplasm of respiratory organs: Secondary | ICD-10-CM

## 2022-06-13 DIAGNOSIS — Z87891 Personal history of nicotine dependence: Secondary | ICD-10-CM

## 2022-06-13 NOTE — Telephone Encounter (Signed)
Left message for patient to call to review results of LDCT.  Results will be mailed to patient.

## 2022-06-17 ENCOUNTER — Telehealth: Payer: Self-pay | Admitting: Internal Medicine

## 2022-06-18 NOTE — Telephone Encounter (Signed)
Called and spoke with pt's spouse [name] letting her know that pt was due for an appt and stated to her at that appt they could discuss with provider about nebulizer and solution. She verbalized understanding and an appt for pt was scheduled. Nothing further needed.

## 2022-07-07 ENCOUNTER — Ambulatory Visit: Payer: No Typology Code available for payment source | Admitting: Nurse Practitioner

## 2022-07-17 ENCOUNTER — Telehealth: Payer: Self-pay | Admitting: Cardiology

## 2022-07-17 NOTE — Telephone Encounter (Signed)
Called patient and spoke with his wife. He only took for about a week. Wife unable to remember what kind of side effects he was having. Wife stated he would not be home until tomorrow. Instructed wife that I would follow up tomorrow.

## 2022-07-17 NOTE — Telephone Encounter (Signed)
Pt c/o medication issue:  1. Name of Medication:  atorvastatin (LIPITOR) 10 MG tablet  2. How are you currently taking this medication (dosage and times per day)?    3. Are you having a reaction (difficulty breathing--STAT)?   4. What is your medication issue?   Malissa with [facility] states the patient informed them that he is no longer taking Atorvastatin due to side effects. Malissa wanted to inform MD of this and she is requesting that a call be made to the patient to discuss--she mentions that she does not know what the side effects were. If questions a call may be returned to [facility] at [phone] (ext: [phone]).

## 2022-07-17 NOTE — Telephone Encounter (Signed)
Previously on Rosuvastatin but had stopped taking at last visit 03/2022 for unclear reason.   He may instead start Zetia '10mg'$  daily if he did not tolerate Rosuvastatin nor Atorvastatin.  Loel Dubonnet, NP

## 2022-07-18 MED ORDER — EZETIMIBE 10 MG PO TABS: 10.0000 mg | ORAL_TABLET | Freq: Every day | ORAL | 6 refills | Status: DC

## 2022-07-18 NOTE — Addendum Note (Signed)
Addended by: Gerald Stabs on: 07/18/2022 08:03 AM   Modules accepted: Orders

## 2022-07-18 NOTE — Telephone Encounter (Signed)
Returned call to patient, no answer, left message with provider details. Rx of Zetia '10mg'$  daily sent to Pharmacy.

## 2022-07-25 ENCOUNTER — Ambulatory Visit (INDEPENDENT_AMBULATORY_CARE_PROVIDER_SITE_OTHER): Payer: No Typology Code available for payment source | Admitting: Nurse Practitioner

## 2022-07-25 ENCOUNTER — Encounter: Payer: Self-pay | Admitting: Nurse Practitioner

## 2022-07-25 VITALS — BP 112/70 | HR 76 | Ht 67.0 in | Wt 128.5 lb

## 2022-07-25 DIAGNOSIS — J3089 Other allergic rhinitis: Secondary | ICD-10-CM

## 2022-07-25 DIAGNOSIS — J449 Chronic obstructive pulmonary disease, unspecified: Secondary | ICD-10-CM | POA: Diagnosis not present

## 2022-07-25 NOTE — Assessment & Plan Note (Signed)
Severe to very severe on previous spirometry. Moderate symptom burden. Last exacerbation March 2023. Compensated on triple therapy regimen. Orders placed for new nebulizer machine. Discussed option of attending pulmonary rehab - pt declined at this time. COPD action plan in place. Counseled to remain smoke free. Up to date on flu vaccine.  Patient Instructions  -Continue Albuterol inhaler 2 puffs or 3 mL neb every 6 hours as needed for shortness of breath or wheezing. Notify if symptoms persist despite rescue inhaler/neb use. -Continue Xyzal 5 mg daily for allergies  -Continue Flonase nasal spray 2 sprays each nostril daily -Continue Trelegy 1 puff daily. Brush tongue and rinse mouth afterwards    Continue following with lung cancer screening program   Follow up in 3 months with Dr. Shearon Stalls. If symptoms worsen, please contact office for sooner follow up or seek emergency care.

## 2022-07-25 NOTE — Progress Notes (Signed)
$'@Patient'j$  ID: Joshua Reed, male    DOB: Jan 12, 1946, 77 y.o.   MRN: [id number]  Chief Complaint  Patient presents with   Follow-up    Pt f/u he is feeling okay his breathing is baseline, currently states he is having post-nasal drip irritating his throat. Doesn't use oxygen. Currently uses albuterol inhaler and trelegy (states they are helpful). He is interested in getting a nebulizer machine bc he has lost his.     Referring provider: Lawerance Cruel, MD  HPI: 77 year old male, former smoker (60 pack years) followed for COPD with chronic bronchitis and emphysema. He is a patient of Dr. Mauricio Po and last seen in office on 09/19/2021 by [facility] NP. He is followed by the lung cancer screening program with last LDCT chest on 06/11/2022. Past medical history significant for seizure disorder, ETOH abuse, macular degeneration, severe protein calorie malnutrition, tobacco abuse, HLD.   TEST/EVENTS:  10/20/2016 Spirometry: FVC 1.8 (45), FEV1 0.9 (29), ratio 47. Very severe obstructive airway disease.  03/08/2021 LDCT chest: atherosclerosis. Centrilobular emphysema. Biapical pleuroparenchymal scarring. Calcified RLL granuloma. No nodules present - Lung-RADS1  06/10/2022 LDCT chest: Lung RADS 2. CAD.  08/27/2021: OV with Dr. Shearon Stalls. Evidence of emphysema on CT chest. Experiences DOE; able to complete daily activities but difficulties lifting things. Using albuterol Twice daily. Not on any maintenance therapies - wanted to hold off. PFTs ordered. Recently quit smoking 07/2021.   09/19/2021: Ov with Fadia Marlar NP for worsening in his breathing. He contacted the office around a week ago and decided he wanted to be started on a daily maintenance inhaler so Trelegy was sent to his pharmacy. He started this yesterday; however, last night he had a scary episode with terrible shortness of breath. He used his daughter's nebulizer machine and felt some relief afterwards. Today, he reports that his breathing is slightly better  but he still has more shortness of breath than he normally does. He hasn't noticed a lot of wheezing but has some noisy breathing every now and then. He has been coughing frequently. He reports that he occasionally gets up some clear sputum but other times it's nonproductive and congested. Using albuterol a few times a day. Allergy symptoms have also flared over the past few weeks with nasal drainage and a tickle in his throat. He's tried multiple OTC allergy medicines in the past without much relief. Treated for AECOPD with z pack and prednisone. [facility] allergy symptoms.  07/25/2022: Today - follow up  Patient presents today for overdue follow up. He has been doing okay since he was here last. Breathing has been stable since he was treated for exacerbation in March. No antibiotics or steroids. No hospitalizations. He's able to complete daily activities without significant burden. Finds that he gets most winded when he's carrying something or trying to do work around the house. His cough is at baseline; minimally productive with white sputum. No increased congestion. He is on Trelegy. Uses albuterol a few times a week. He wants to get a new nebulizer machine; lost his old one. He had low dose lung cancer CT with lung RADS 2. Plan for repeat in November 2024.   Allergies  Allergen Reactions   Acyclovir And Related Hives    Immunization History  Administered Date(s) Administered   Influenza, High Dose Seasonal PF 07/12/2014, 05/15/2017, 07/29/2018, 05/16/2019   PFIZER(Purple Top)SARS-COV-2 Vaccination 09/25/2019, 10/19/2019   Pneumococcal Conjugate-13 01/10/2015   Pneumococcal Polysaccharide-23 04/14/2013   Tdap 05/24/2014   Zoster, Live 04/14/2013  Past Medical History:  Diagnosis Date   Alcohol abuse    COPD (chronic obstructive pulmonary disease) (HCC)    Hyperlipidemia    Low vitamin B12 level    Macular degeneration    Seizure disorder (HCC) 2205   ALCOHOL RELATED   Seizures (HCC)     Vitamin D deficiency     Tobacco History: Social History   Tobacco Use  Smoking Status Some Days   Packs/day: 0.50   Types: Cigarettes   Last attempt to quit: 08/06/2021   Years since quitting: 0.9  Smokeless Tobacco Never  Tobacco Comments   States that he has smoked a few times since 07/2021.Marland Kitchen HEI 07/25/2022   Ready to quit: Not Answered Counseling given: Not Answered Tobacco comments: States that he has smoked a few times since 07/2021.Marland Kitchen HEI 07/25/2022   Outpatient Medications Prior to Visit  Medication Sig Dispense Refill   albuterol (PROVENTIL) (2.5 MG/3ML) 0.083% nebulizer solution Take 3 mLs (2.5 mg total) by nebulization every 6 (six) hours as needed for wheezing or shortness of breath. 75 mL 3   albuterol (VENTOLIN HFA) 108 (90 Base) MCG/ACT inhaler Inhale into the lungs every 6 (six) hours as needed.     aspirin EC 81 MG tablet Take 1 tablet (81 mg total) by mouth daily. Swallow whole. 90 tablet 3   Coenzyme Q10 (CO Q 10 PO) Take 1 tablet by mouth daily.     Cyanocobalamin (B-12 IJ) Takes injection as directed bi weekly     ezetimibe (ZETIA) 10 MG tablet Take 1 tablet (10 mg total) by mouth daily. 30 tablet 6   fluticasone (FLONASE) 50 MCG/ACT nasal spray Place 2 sprays into both nostrils daily. 18.2 mL 2   Fluticasone-Umeclidin-Vilant (TRELEGY ELLIPTA) 100-62.5-25 MCG/ACT AEPB Inhale 1 puff into the lungs daily. 1 each 3   levETIRAcetam (KEPPRA) 1000 MG tablet Take 1 tablet (1,000 mg total) by mouth 2 (two) times daily. 180 tablet 3   levocetirizine (XYZAL) 5 MG tablet Take 1 tablet (5 mg total) by mouth every evening. 90 tablet 1   Multiple Vitamin (MULTIVITAMIN WITH MINERALS) TABS tablet Take 1 tablet by mouth daily.     Omega-3 Fatty Acids (FISH OIL PO) Take 1 capsule by mouth daily.     Vitamin D, Ergocalciferol, (DRISDOL) 50000 UNITS CAPS capsule Take 50,000 Units by mouth once a week.  0   No facility-administered medications prior to visit.     Review of Systems:    Constitutional: No weight loss or gain, night sweats, fevers, chills, fatigue HEENT: No headaches, difficulty swallowing, tooth/dental problems, or sore throat. No sneezing, itching, ear ache, postnasal drip. +occasional nasal congestion CV:  No chest pain, orthopnea, PND, swelling in lower extremities, anasarca, dizziness, palpitations, syncope Resp: +shortness of breath with exertion; chronic cough. No excess mucus or change in color of mucus. No hemoptysis. No wheezing.  No chest wall deformity GI:  No heartburn, indigestion, abdominal pain, nausea, vomiting, loss of appetite GU: No dysuria, change in color of urine, urgency or frequency.   Skin: No rash, lesions, ulcerations MSK:  No joint pain or swelling.   Neuro: No dizziness or lightheadedness.  Psych: No depression or anxiety. Mood stable.     Physical Exam:  BP 112/70   Pulse 76   Ht '5\' 7"'$  (1.702 m)   Wt 128 lb 8 oz (58.3 kg)   SpO2 96%   BMI 20.13 kg/m   GEN: Pleasant, interactive, well-appearing; in no acute distress. HEENT:  Normocephalic  and atraumatic. PERRLA. Sclera white. Nasal turbinates pink, moist and patent bilaterally. No rhinorrhea present. Oropharynx pink and moist, without exudate or edema. No lesions, ulcerations  NECK:  Supple w/ fair ROM. No JVD present. Normal carotid impulses w/o bruits. Thyroid symmetrical with no goiter or nodules palpated. No lymphadenopathy.   CV: RRR, no m/r/g, no peripheral edema. Pulses intact, +2 bilaterally. No cyanosis, pallor or clubbing. PULMONARY:  Unlabored, regular breathing. Diminished bases bilaterally A&P w/o wheezes/rales/rhonchi. No accessory muscle use. No dullness to percussion. GI: BS present and normoactive. Soft, non-tender to palpation. No organomegaly or masses detected.  MSK: No erythema, warmth or tenderness. Cap refil <2 sec all extrem. No deformities or joint swelling noted.  Neuro: A/Ox3. No focal deficits noted.   Skin: Warm, no lesions or rashe Psych:  Normal affect and behavior. Judgement and thought content appropriate.     Lab Results:  CBC    Component Value Date/Time   WBC 8.6 07/03/2014 0511   RBC 3.78 (L) 07/03/2014 0511   HGB 11.4 (L) 07/03/2014 0511   HCT 34.6 (L) 07/03/2014 0511   PLT 278 07/03/2014 0511   MCV 91.5 07/03/2014 0511   MCH 30.2 07/03/2014 0511   MCHC 32.9 07/03/2014 0511   RDW 17.9 (H) 07/03/2014 0511   LYMPHSABS 1.5 06/19/2014 0514   MONOABS 1.1 (H) 06/19/2014 0514   EOSABS 0.1 06/19/2014 0514   BASOSABS 0.0 06/19/2014 0514    BMET    Component Value Date/Time   NA 134 (L) 07/03/2014 0511   K 4.2 07/03/2014 0511   CL 100 07/03/2014 0511   CO2 25 07/03/2014 0511   GLUCOSE 80 07/03/2014 0511   BUN 7 07/03/2014 0511   CREATININE 0.59 07/03/2014 0511   CALCIUM 7.8 (L) 07/03/2014 0511   GFRNONAA >90 07/03/2014 0511   GFRAA >90 07/03/2014 0511    BNP No results found for: "BNP"   Imaging:  No results found.         No data to display          No results found for: "NITRICOXIDE"      Assessment & Plan:   COPD, severe ([place]) Severe to very severe on previous spirometry. Moderate symptom burden. Last exacerbation March 2023. Compensated on triple therapy regimen. Orders placed for new nebulizer machine. Discussed option of attending pulmonary rehab - pt declined at this time. COPD action plan in place. Counseled to remain smoke free. Up to date on flu vaccine.  Patient Instructions  -Continue Albuterol inhaler 2 puffs or 3 mL neb every 6 hours as needed for shortness of breath or wheezing. Notify if symptoms persist despite rescue inhaler/neb use. -Continue Xyzal 5 mg daily for allergies  -Continue Flonase nasal spray 2 sprays each nostril daily -Continue Trelegy 1 puff daily. Brush tongue and rinse mouth afterwards    Continue following with lung cancer screening program   Follow up in 3 months with Dr. Shearon Stalls. If symptoms worsen, please contact office for sooner follow up  or seek emergency care.   Allergic rhinitis Stable on current regimen.    I spent 25 minutes of dedicated to the care of this patient on the date of this encounter to include pre-visit review of records, face-to-face time with the patient discussing conditions above, post visit ordering of testing, clinical documentation with the electronic health record, making appropriate referrals as documented, and communicating necessary findings to members of the patients care team.  Clayton Bibles, NP 07/25/2022  Pt aware and  understands NP's role. Marland Kitchen

## 2022-07-25 NOTE — Patient Instructions (Addendum)
-  Continue Albuterol inhaler 2 puffs or 3 mL neb every 6 hours as needed for shortness of breath or wheezing. Notify if symptoms persist despite rescue inhaler/neb use. -Continue Xyzal 5 mg daily for allergies  -Continue Flonase nasal spray 2 sprays each nostril daily -Continue Trelegy 1 puff daily. Brush tongue and rinse mouth afterwards    Continue following with lung cancer screening program   Follow up in 3 months with Dr. Shearon Stalls. If symptoms worsen, please contact office for sooner follow up or seek emergency care.

## 2022-07-25 NOTE — Addendum Note (Signed)
Addended by: Priscille Kluver on: 07/25/2022 02:48 PM   Modules accepted: Orders

## 2022-07-25 NOTE — Assessment & Plan Note (Signed)
Stable on current regimen   

## 2022-08-18 ENCOUNTER — Telehealth: Payer: Self-pay | Admitting: Nurse Practitioner

## 2022-08-18 NOTE — Telephone Encounter (Signed)
Spoke with patients wife, aware that we need to send new order to [facility] and [facility] will be reaching out to them soon

## 2022-08-18 NOTE — Telephone Encounter (Signed)
Order was put in for pt to receive nebulizer 1/5 and showing that order was sent to [facility].  Routing to PCCs to further investigate this.

## 2022-08-18 NOTE — Telephone Encounter (Signed)
Patient's wife called and patient has not received nebulizer machine from 07/25/2022. Was not nformed that it would go through a DME- they thought it woud go to [facility]. Called [facility] and they attempted to reach out to the patient multiple times, but patient did not respond. [facility] had patients work number which is not the best number. [facility] is now needing a new order for a nebulizer machine and patient will need solution called into [facility] on [address]. Left voicemail for patient's wife to call back to discuss.  Please advise asap.

## 2022-08-19 NOTE — Telephone Encounter (Signed)
[employer] message received:   Joshua Reed, [facility],  looks like his order was voided after 3 contact attempts to patient failed.   Calling pt now

## 2022-08-25 ENCOUNTER — Ambulatory Visit (INDEPENDENT_AMBULATORY_CARE_PROVIDER_SITE_OTHER): Payer: No Typology Code available for payment source

## 2022-08-25 ENCOUNTER — Encounter: Payer: Self-pay | Admitting: Pulmonary Disease

## 2022-08-25 ENCOUNTER — Ambulatory Visit (INDEPENDENT_AMBULATORY_CARE_PROVIDER_SITE_OTHER): Payer: No Typology Code available for payment source | Admitting: Pulmonary Disease

## 2022-08-25 ENCOUNTER — Telehealth: Payer: Self-pay | Admitting: Nurse Practitioner

## 2022-08-25 VITALS — BP 118/62 | HR 62 | Ht 68.0 in | Wt 130.0 lb

## 2022-08-25 DIAGNOSIS — J441 Chronic obstructive pulmonary disease with (acute) exacerbation: Secondary | ICD-10-CM

## 2022-08-25 DIAGNOSIS — R0602 Shortness of breath: Secondary | ICD-10-CM | POA: Diagnosis not present

## 2022-08-25 DIAGNOSIS — R059 Cough, unspecified: Secondary | ICD-10-CM | POA: Diagnosis not present

## 2022-08-25 DIAGNOSIS — J439 Emphysema, unspecified: Secondary | ICD-10-CM | POA: Diagnosis not present

## 2022-08-25 MED ORDER — PREDNISONE 20 MG PO TABS: ORAL_TABLET | ORAL | 0 refills | Status: AC

## 2022-08-25 MED ORDER — DOXYCYCLINE HYCLATE 100 MG PO TABS: 100.0000 mg | ORAL_TABLET | Freq: Two times a day (BID) | ORAL | 0 refills | Status: DC

## 2022-08-25 MED ORDER — ALBUTEROL SULFATE (2.5 MG/3ML) 0.083% IN NEBU: 2.5000 mg | INHALATION_SOLUTION | Freq: Four times a day (QID) | RESPIRATORY_TRACT | 3 refills | Status: DC | PRN

## 2022-08-25 NOTE — Telephone Encounter (Signed)
Chest xray is clear and unchanged compared to prior. No sign of pneumonia. Overall this is good news!

## 2022-08-25 NOTE — Telephone Encounter (Addendum)
Spoke to pt's wife answered her questions. Informed her of x-ray results Pt verbalized understanding nothing further needed.

## 2022-08-25 NOTE — Patient Instructions (Signed)
Nice to meet you, I am sorry you are not feeling well  Description of symptoms sound like an exacerbation of COPD  To treat this I recommend doxycycline 100 mg twice a day for 7 days.  This is an antibiotic.  In addition, use prednisone 40 mg for 5 days then 20 mg for 5 days then stop.  You sound wheezy on exam.  We will get a chest x-ray today to evaluate for possible pneumonia  Return to clinic in 2 weeks with Roxan Diesel, NP or sooner as needed

## 2022-08-25 NOTE — Progress Notes (Signed)
$'@Patient'E$  ID: Joshua Reed, male    DOB: 08-11-1945, 77 y.o.   MRN: [id number]  Chief Complaint  Patient presents with   Follow-up    SOB,coughing,wheezing  Proventil neb Ventolin Trelegy    Referring provider: Lawerance Cruel, MD  HPI:   77 y.o. male history of COPD on Trelegy whom we are seeing for evaluation of 2 weeks of worsening cough and shortness of breath.  Most recent pulmonary clinic note from Roxan Diesel, NP reviewed.  Most recent note from primary pulmonologist, Lenice Llamas, MD reviewed.  About 2 weeks of worsening congestion, cough.  Intermittently productive.  Worse in the evenings.  Shortness of breath as well.  With exertion but primarily at night.  Wakes up feeling short of breath.  Feels like he is suffocating.  Will use his rescue medicines.  Usually it helps after the first dose after a while.  Gradually calmed down.  Sometimes he the second dose.  He is using them first thing when he wakes up as well given increased congestion etc.  Reports adherence to Trelegy.  He does think this helps initially in the mornings with the symptoms.  Had similar episode late winter 2023.  Denies fever now or at onset of symptoms.  Denies symptoms of viral URI, sore throat watery eyes nasal congestion etc. now or at time of onset of symptoms.   Questionaires / Pulmonary Flowsheets:   ACT:      No data to display          MMRC:     No data to display          Epworth:      No data to display          Tests:   FENO:  No results found for: "NITRICOXIDE"  PFT:     No data to display          WALK:      No data to display          Imaging: Personally reviewed and as per EMR and discussion in this note No results found.  Lab Results: Personally reviewed CBC    Component Value Date/Time   WBC 8.6 07/03/2014 0511   RBC 3.78 (L) 07/03/2014 0511   HGB 11.4 (L) 07/03/2014 0511   HCT 34.6 (L) 07/03/2014 0511   PLT 278 07/03/2014 0511    MCV 91.5 07/03/2014 0511   MCH 30.2 07/03/2014 0511   MCHC 32.9 07/03/2014 0511   RDW 17.9 (H) 07/03/2014 0511   LYMPHSABS 1.5 06/19/2014 0514   MONOABS 1.1 (H) 06/19/2014 0514   EOSABS 0.1 06/19/2014 0514   BASOSABS 0.0 06/19/2014 0514    BMET    Component Value Date/Time   NA 134 (L) 07/03/2014 0511   K 4.2 07/03/2014 0511   CL 100 07/03/2014 0511   CO2 25 07/03/2014 0511   GLUCOSE 80 07/03/2014 0511   BUN 7 07/03/2014 0511   CREATININE 0.59 07/03/2014 0511   CALCIUM 7.8 (L) 07/03/2014 0511   GFRNONAA >90 07/03/2014 0511   GFRAA >90 07/03/2014 0511    BNP No results found for: "BNP"  ProBNP No results found for: "PROBNP"  Specialty Problems       Pulmonary Problems   COPD, severe (HCC)    FEV1 29%      Allergic rhinitis    Allergies  Allergen Reactions   Acyclovir And Related Hives    Immunization History  Administered Date(s) Administered  Influenza, High Dose Seasonal PF 07/12/2014, 05/15/2017, 07/29/2018, 05/16/2019   PFIZER(Purple Top)SARS-COV-2 Vaccination 09/25/2019, 10/19/2019   Pneumococcal Conjugate-13 01/10/2015   Pneumococcal Polysaccharide-23 04/14/2013   Tdap 05/24/2014   Zoster, Live 04/14/2013    Past Medical History:  Diagnosis Date   Alcohol abuse    COPD (chronic obstructive pulmonary disease) (HCC)    Hyperlipidemia    Low vitamin B12 level    Macular degeneration    Seizure disorder (HCC) 2205   ALCOHOL RELATED   Seizures (HCC)    Vitamin D deficiency     Tobacco History: Social History   Tobacco Use  Smoking Status Some Days   Packs/day: 0.50   Types: Cigarettes   Last attempt to quit: 08/06/2021   Years since quitting: 1.0  Smokeless Tobacco Never  Tobacco Comments   States that he has smoked a few times since 07/2021.Marland Kitchen HEI 07/25/2022   Ready to quit: Not Answered Counseling given: Not Answered Tobacco comments: States that he has smoked a few times since 07/2021.Marland Kitchen HEI 07/25/2022   Continue to not  smoke  Outpatient Encounter Medications as of 08/25/2022  Medication Sig   albuterol (PROVENTIL) (2.5 MG/3ML) 0.083% nebulizer solution Take 3 mLs (2.5 mg total) by nebulization every 6 (six) hours as needed for wheezing or shortness of breath.   albuterol (VENTOLIN HFA) 108 (90 Base) MCG/ACT inhaler Inhale into the lungs every 6 (six) hours as needed.   Coenzyme Q10 (CO Q 10 PO) Take 1 tablet by mouth daily.   Cyanocobalamin (B-12 IJ) Takes injection as directed bi weekly   doxycycline (VIBRA-TABS) 100 MG tablet Take 1 tablet (100 mg total) by mouth 2 (two) times daily.   fluticasone (FLONASE) 50 MCG/ACT nasal spray Place 2 sprays into both nostrils daily.   Fluticasone-Umeclidin-Vilant (TRELEGY ELLIPTA) 100-62.5-25 MCG/ACT AEPB Inhale 1 puff into the lungs daily.   levETIRAcetam (KEPPRA) 1000 MG tablet Take 1 tablet (1,000 mg total) by mouth 2 (two) times daily.   Multiple Vitamin (MULTIVITAMIN WITH MINERALS) TABS tablet Take 1 tablet by mouth daily.   Omega-3 Fatty Acids (FISH OIL PO) Take 1 capsule by mouth daily.   predniSONE (DELTASONE) 20 MG tablet Take 2 tablets (40 mg total) by mouth daily with breakfast for 5 days, THEN 1 tablet (20 mg total) daily with breakfast for 5 days.   Vitamin D, Ergocalciferol, (DRISDOL) 50000 UNITS CAPS capsule Take 50,000 Units by mouth once a week.   aspirin EC 81 MG tablet Take 1 tablet (81 mg total) by mouth daily. Swallow whole. (Patient not taking: Reported on 08/25/2022)   ezetimibe (ZETIA) 10 MG tablet Take 1 tablet (10 mg total) by mouth daily. (Patient not taking: Reported on 08/25/2022)   levocetirizine (XYZAL) 5 MG tablet Take 1 tablet (5 mg total) by mouth every evening. (Patient not taking: Reported on 08/25/2022)   No facility-administered encounter medications on file as of 08/25/2022.     Review of Systems  Review of Systems  No chest pain with exertion.  No orthopnea or PND.  Comprehensive review of systems otherwise negative. Physical  Exam  BP 118/62 (BP Location: Left Arm, Patient Position: Sitting, Cuff Size: Normal)   Pulse 62   Ht '5\' 8"'$  (1.727 m)   Wt 130 lb (59 kg)   SpO2 94%   BMI 19.77 kg/m   Wt Readings from Last 5 Encounters:  08/25/22 130 lb (59 kg)  07/25/22 128 lb 8 oz (58.3 kg)  04/22/22 126 lb (57.2 kg)  04/02/22 128  lb 3.2 oz (58.2 kg)  09/19/21 126 lb 6.4 oz (57.3 kg)    BMI Readings from Last 5 Encounters:  08/25/22 19.77 kg/m  07/25/22 20.13 kg/m  04/22/22 19.16 kg/m  04/02/22 19.49 kg/m  09/19/21 19.22 kg/m     Physical Exam General: Sitting in chair, in no acute distress Eyes: EOMI, no icterus Neck: Supple, no JVP Pulmonary: Scattered diffuse pain in expiratory wheezes, otherwise clear, normal work of breathing on room air Cardiovascular: Warm, no edema Abdomen: Nondistended, bowel sounds present MSK: No synovitis, no joint effusion Neuro: Normal gait, no weakness Psych: Normal mood, full affect    Assessment & Plan:   Cough, shortness of breath due to COPD exacerbation: Present for a couple of weeks.  Symptoms worse at night.  Chest exam notable for wheezing, no signs or symptoms of pneumonia.  Chest x-ray for further evaluation.  Doxycycline twice daily for a week and prednisone 40 mg for 5 days then 20 mg for 5 days prescribed today.  Severe COPD: FEV1 in 2018 around 30%.  Likely has worsened in the interim.  Continue Trelegy.  Continue albuterol HFA as needed.  As needed albuterol nebulizer solution refilled, new order for nebulizer machine today.   Return in about 2 weeks (around 09/08/2022).   Lanier Clam, MD 08/25/2022   This appointment required 60 minutes of patient care (this includes precharting, chart review, review of results, face-to-face care, etc.).

## 2022-08-25 NOTE — Telephone Encounter (Signed)
Patient's wife called back and states they have not heard anything from [employer]- called [employer] and spoke with Suanne Marker who states the order is still good and retail will reach out to the patient again- I confirmed the number [employer] had for the patient. Called patient back and informed that [employer] would be reaching back out and provided the number for [employer] in case she needs to call them.

## 2022-08-27 NOTE — Telephone Encounter (Signed)
Spoke with patient's wife and she did hear from [employer] regarding nebulizer machine. Closing encounter.

## 2022-09-08 ENCOUNTER — Other Ambulatory Visit: Payer: Self-pay | Admitting: Family Medicine

## 2022-09-08 DIAGNOSIS — R9389 Abnormal findings on diagnostic imaging of other specified body structures: Secondary | ICD-10-CM

## 2022-09-17 ENCOUNTER — Ambulatory Visit: Payer: No Typology Code available for payment source | Admitting: Pulmonary Disease

## 2022-09-19 ENCOUNTER — Telehealth: Payer: Self-pay | Admitting: Pulmonary Disease

## 2022-09-19 DIAGNOSIS — K120 Recurrent oral aphthae: Secondary | ICD-10-CM

## 2022-09-19 MED ORDER — NYSTATIN 100000 UNIT/ML MT SUSP: 5.0000 mL | Freq: Four times a day (QID) | OROMUCOSAL | 0 refills | Status: AC

## 2022-09-19 MED ORDER — NYSTATIN 100000 UNIT/ML MT SUSP: 5.0000 mL | Freq: Four times a day (QID) | OROMUCOSAL | 0 refills | Status: DC

## 2022-09-19 NOTE — Telephone Encounter (Signed)
PT wife calling. Sees a small sore on his tongue and it is very painful. This is the second time it has happened. Currently gargling in salt water.   Pls call wife @ [phone]

## 2022-09-19 NOTE — Telephone Encounter (Signed)
Called and spoke with [name]. She stated that the patient has developed a small, canker like sore on his tongue. It developed a few days ago. He just recently started using Trelegy but was using it as needed. They are both now aware that he needs to use this every day and rinse his mouth out after using it.   He denied having any difficulty swallowing, breathing or sore throat. He has been gargling with salt water.   [name] wanted to know if something could be sent to the pharmacy to help. She will provide a pharmacy once we call back.   Joellen Jersey, can you please advise? Thanks!

## 2022-09-19 NOTE — Telephone Encounter (Signed)
Magic mouthwash sent - use 4x/day for 10 days. Please educate on proper oral hygiene after using Trelegy. Thanks.

## 2022-09-19 NOTE — Telephone Encounter (Signed)
Called and spoke with Joshua Reed. She verbalized understanding. She wanted the solution to be sent to [facility] on [address]. RX sent. I called and canceled the RX that was sent to [facility] in [facility].   Nothing further needed at time of call.

## 2022-09-25 ENCOUNTER — Encounter: Payer: Self-pay | Admitting: Pulmonary Disease

## 2022-09-25 ENCOUNTER — Ambulatory Visit (INDEPENDENT_AMBULATORY_CARE_PROVIDER_SITE_OTHER): Payer: No Typology Code available for payment source | Admitting: Pulmonary Disease

## 2022-09-25 VITALS — BP 124/68 | HR 65 | Wt 132.0 lb

## 2022-09-25 DIAGNOSIS — J449 Chronic obstructive pulmonary disease, unspecified: Secondary | ICD-10-CM

## 2022-09-25 NOTE — Patient Instructions (Signed)
Nice to see you again  Okay to stop the inhalers  Continue the mouthwash  I expect the ulcers to continue to improve over the next week or so.  If ulcer still there next week, see me a message and we will send additional course of the mouthwash  Return to clinic in 3 months or sooner as needed with Dr. Silas Flood

## 2022-09-26 NOTE — Progress Notes (Signed)
$'@Patient'P$  ID: Joshua Reed, male    DOB: November 17, 1945, 77 y.o.   MRN: [id number]  Chief Complaint  Patient presents with   Follow-up    Pt is here for follow up for COPD. Pt states breathing is doing better. He is still using magic mouth wash given the sores on his tongue.     Referring provider: Lawerance Cruel, MD  HPI:   77 y.o. male history of COPD on Trelegy whom we are seeing for evaluation of mouth sores.  Most recent pulmonary clinic note from Roxan Diesel, NP reviewed.    His last visit with me was prescribed prednisone given ongoing symptoms of exacerbation.  He had been on Trelegy prior to meeting me.  Continue Trelegy.  Developed mouth sore on left side of tongue.  Appears like an aphthous ulcer to me.  No sign of thrush.  He has stopped his inhaler for about 10 days.  No worsening breathing.  He is concerned the Trelegy was contributing.  This seems less likely.   Questionaires / Pulmonary Flowsheets:   ACT:      No data to display           MMRC:     No data to display           Epworth:      No data to display           Tests:   FENO:  No results found for: "NITRICOXIDE"  PFT:     No data to display           WALK:      No data to display           Imaging: Personally reviewed and as per EMR and discussion in this note No results found.  Lab Results: Personally reviewed CBC    Component Value Date/Time   WBC 8.6 07/03/2014 0511   RBC 3.78 (L) 07/03/2014 0511   HGB 11.4 (L) 07/03/2014 0511   HCT 34.6 (L) 07/03/2014 0511   PLT 278 07/03/2014 0511   MCV 91.5 07/03/2014 0511   MCH 30.2 07/03/2014 0511   MCHC 32.9 07/03/2014 0511   RDW 17.9 (H) 07/03/2014 0511   LYMPHSABS 1.5 06/19/2014 0514   MONOABS 1.1 (H) 06/19/2014 0514   EOSABS 0.1 06/19/2014 0514   BASOSABS 0.0 06/19/2014 0514    BMET    Component Value Date/Time   NA 134 (L) 07/03/2014 0511   K 4.2 07/03/2014 0511   CL 100 07/03/2014 0511   CO2  25 07/03/2014 0511   GLUCOSE 80 07/03/2014 0511   BUN 7 07/03/2014 0511   CREATININE 0.59 07/03/2014 0511   CALCIUM 7.8 (L) 07/03/2014 0511   GFRNONAA >90 07/03/2014 0511   GFRAA >90 07/03/2014 0511    BNP No results found for: "BNP"  ProBNP No results found for: "PROBNP"  Specialty Problems       Pulmonary Problems   COPD, severe (HCC)    FEV1 29%      Allergic rhinitis    Allergies  Allergen Reactions   Acyclovir And Related Hives    Immunization History  Administered Date(s) Administered   Influenza, High Dose Seasonal PF 07/12/2014, 05/15/2017, 07/29/2018, 05/16/2019   PFIZER(Purple Top)SARS-COV-2 Vaccination 09/25/2019, 10/19/2019   Pneumococcal Conjugate-13 01/10/2015   Pneumococcal Polysaccharide-23 04/14/2013   Tdap 05/24/2014   Zoster, Live 04/14/2013    Past Medical History:  Diagnosis Date   Alcohol abuse    COPD (chronic obstructive  pulmonary disease) ([place])    Hyperlipidemia    Low vitamin B12 level    Macular degeneration    Seizure disorder ([place]) 2205   ALCOHOL RELATED   Seizures (HCC)    Vitamin D deficiency     Tobacco History: Social History   Tobacco Use  Smoking Status Former   Packs/day: 0.50   Types: Cigarettes   Quit date: 08/06/2021   Years since quitting: 1.1  Smokeless Tobacco Never   Counseling given: Not Answered   Continue to not smoke  Outpatient Encounter Medications as of 09/25/2022  Medication Sig   albuterol (PROVENTIL) (2.5 MG/3ML) 0.083% nebulizer solution Take 3 mLs (2.5 mg total) by nebulization every 6 (six) hours as needed for wheezing or shortness of breath.   albuterol (VENTOLIN HFA) 108 (90 Base) MCG/ACT inhaler Inhale into the lungs every 6 (six) hours as needed.   aspirin EC 81 MG tablet Take 1 tablet (81 mg total) by mouth daily. Swallow whole.   Coenzyme Q10 (CO Q 10 PO) Take 1 tablet by mouth daily.   Cyanocobalamin (B-12 IJ) Takes injection as directed bi weekly   doxycycline (VIBRA-TABS) 100 MG  tablet Take 1 tablet (100 mg total) by mouth 2 (two) times daily.   ezetimibe (ZETIA) 10 MG tablet Take 1 tablet (10 mg total) by mouth daily.   fluticasone (FLONASE) 50 MCG/ACT nasal spray Place 2 sprays into both nostrils daily.   levETIRAcetam (KEPPRA) 1000 MG tablet Take 1 tablet (1,000 mg total) by mouth 2 (two) times daily.   levocetirizine (XYZAL) 5 MG tablet Take 1 tablet (5 mg total) by mouth every evening.   magic mouthwash (nystatin, lidocaine, diphenhydrAMINE) suspension Take 5 mLs by mouth 4 (four) times daily for 10 days.   Multiple Vitamin (MULTIVITAMIN WITH MINERALS) TABS tablet Take 1 tablet by mouth daily.   Omega-3 Fatty Acids (FISH OIL PO) Take 1 capsule by mouth daily.   Vitamin D, Ergocalciferol, (DRISDOL) 50000 UNITS CAPS capsule Take 50,000 Units by mouth once a week.   Fluticasone-Umeclidin-Vilant (TRELEGY ELLIPTA) 100-62.5-25 MCG/ACT AEPB Inhale 1 puff into the lungs daily. (Patient not taking: Reported on 09/25/2022)   No facility-administered encounter medications on file as of 09/25/2022.     Review of Systems  Review of Systems  N/a Physical Exam  BP 124/68 (BP Location: Left Arm, Patient Position: Sitting, Cuff Size: Normal)   Pulse 65   Wt 132 lb (59.9 kg)   SpO2 94%   BMI 20.07 kg/m   Wt Readings from Last 5 Encounters:  09/25/22 132 lb (59.9 kg)  08/25/22 130 lb (59 kg)  07/25/22 128 lb 8 oz (58.3 kg)  04/22/22 126 lb (57.2 kg)  04/02/22 128 lb 3.2 oz (58.2 kg)    BMI Readings from Last 5 Encounters:  09/25/22 20.07 kg/m  08/25/22 19.77 kg/m  07/25/22 20.13 kg/m  04/22/22 19.16 kg/m  04/02/22 19.49 kg/m     Physical Exam General: Sitting in chair, in no acute distress Eyes: EOMI, no icterus Neck: Supple, no JVP Pulmonary: Scattered diffuse pain in expiratory wheezes, otherwise clear, normal work of breathing on room air Cardiovascular: Warm, no edema Abdomen: Nondistended, bowel sounds present MSK: No synovitis, no joint  effusion Neuro: Normal gait, no weakness Psych: Normal mood, full affect    Assessment & Plan:   Mouth sores: Appear like aphthous ulcer.  Encouraged nystatin swish and swallow.  Topical agents as tolerated.  Suspect will improve in the coming days.  If not, can prescribe  another course of nystatin.  If not improving from there, will need to see a dentist or dermatologist for further evaluation.  Severe COPD: FEV1 in 2018 around 30%.  Stop inhalers given concern contributing to mouth ulcer.  This seems unlikely.  Okay to stay off given lack of symptoms..   Return in about 3 months (around 12/26/2022).   Lanier Clam, MD 09/26/2022

## 2022-10-06 ENCOUNTER — Inpatient Hospital Stay: Admission: RE | Admit: 2022-10-06 | Payer: No Typology Code available for payment source | Source: Ambulatory Visit

## 2022-10-08 ENCOUNTER — Ambulatory Visit
Admission: RE | Admit: 2022-10-08 | Discharge: 2022-10-08 | Discharge status: Home / Self Care | Payer: No Typology Code available for payment source | Source: Ambulatory Visit | Attending: Family Medicine | Admitting: Family Medicine

## 2022-10-08 DIAGNOSIS — J9811 Atelectasis: Secondary | ICD-10-CM | POA: Diagnosis not present

## 2022-10-08 DIAGNOSIS — I700 Atherosclerosis of aorta: Secondary | ICD-10-CM | POA: Diagnosis not present

## 2022-10-08 DIAGNOSIS — J439 Emphysema, unspecified: Secondary | ICD-10-CM | POA: Diagnosis not present

## 2022-10-08 DIAGNOSIS — R918 Other nonspecific abnormal finding of lung field: Secondary | ICD-10-CM | POA: Diagnosis not present

## 2022-10-08 DIAGNOSIS — R0602 Shortness of breath: Secondary | ICD-10-CM | POA: Diagnosis not present

## 2022-10-08 DIAGNOSIS — R9389 Abnormal findings on diagnostic imaging of other specified body structures: Secondary | ICD-10-CM

## 2022-10-08 DIAGNOSIS — R911 Solitary pulmonary nodule: Secondary | ICD-10-CM | POA: Diagnosis not present

## 2022-10-23 ENCOUNTER — Ambulatory Visit (INDEPENDENT_AMBULATORY_CARE_PROVIDER_SITE_OTHER): Payer: No Typology Code available for payment source | Admitting: Internal Medicine

## 2022-10-23 ENCOUNTER — Encounter: Payer: Self-pay | Admitting: Internal Medicine

## 2022-10-23 VITALS — BP 118/64 | HR 69 | Temp 97.6°F | Ht 67.0 in | Wt 131.8 lb

## 2022-10-23 DIAGNOSIS — J441 Chronic obstructive pulmonary disease with (acute) exacerbation: Secondary | ICD-10-CM | POA: Diagnosis not present

## 2022-10-23 DIAGNOSIS — R0609 Other forms of dyspnea: Secondary | ICD-10-CM | POA: Diagnosis not present

## 2022-10-23 DIAGNOSIS — Z8679 Personal history of other diseases of the circulatory system: Secondary | ICD-10-CM | POA: Diagnosis not present

## 2022-10-23 LAB — CBC WITH DIFFERENTIAL/PLATELET
Basophils Absolute: 0 10*3/uL (ref 0.0–0.1)
Basophils Relative: 0.4 % (ref 0.0–3.0)
Eosinophils Absolute: 0.7 10*3/uL (ref 0.0–0.7)
Eosinophils Relative: 10.3 % — ABNORMAL HIGH (ref 0.0–5.0)
HCT: 42.7 % (ref 39.0–52.0)
Hemoglobin: 14.2 g/dL (ref 13.0–17.0)
Lymphocytes Relative: 27.3 % (ref 12.0–46.0)
Lymphs Abs: 1.9 10*3/uL (ref 0.7–4.0)
MCHC: 33.2 g/dL (ref 30.0–36.0)
MCV: 92.4 fl (ref 78.0–100.0)
Monocytes Absolute: 0.7 10*3/uL (ref 0.1–1.0)
Monocytes Relative: 9.9 % (ref 3.0–12.0)
Neutro Abs: 3.7 10*3/uL (ref 1.4–7.7)
Neutrophils Relative %: 52.1 % (ref 43.0–77.0)
Platelets: 261 10*3/uL (ref 150.0–400.0)
RBC: 4.62 Mil/uL (ref 4.22–5.81)
RDW: 13.8 % (ref 11.5–15.5)
WBC: 7 10*3/uL (ref 4.0–10.5)

## 2022-10-23 LAB — BRAIN NATRIURETIC PEPTIDE: Pro B Natriuretic peptide (BNP): 26 pg/mL (ref 0.0–100.0)

## 2022-10-23 LAB — TROPONIN I (HIGH SENSITIVITY): High Sens Troponin I: 5 ng/L (ref 2–17)

## 2022-10-23 MED ORDER — PREDNISONE 10 MG PO TABS: ORAL_TABLET | ORAL | 0 refills | Status: DC

## 2022-10-23 NOTE — Patient Instructions (Addendum)
ICD-10-CM   1. COPD with acute exacerbation (HCC)  J44.1 CBC with Differential    Perennial allergen profile IgE    B Nat Peptide    Alpha-1 antitrypsin phenotype    ECHOCARDIOGRAM COMPLETE    Pulmonary function test    Pulse oximetry, overnight    Alpha-1 antitrypsin phenotype    B Nat Peptide    Perennial allergen profile IgE    CBC with Differential    CANCELED: Troponin I    CANCELED: Troponin T, High Sensitivity (hs-TnT)    2. COPD, frequent exacerbations (HCC)  J44.1 Troponin I (High Sensitivity)    Troponin I (High Sensitivity)    3. DOE (dyspnea on exertion)  R06.09     4. History of CAD (coronary artery disease)  Z86.79      Repeated flare up . Currently in flare up prossibly due to spring allergies and not taking trelegy due to oral lesion  Plan - do blood work 10/23/2022   - cbc with diff, RAST allergy panel . BNP and troponin  - check alpha 1 AT phenotpye  - Take prednisone 40 mg daily x 2 days, then 20mg  daily x 2 days, then 10mg  daily x 2 days, then 5mg  daily x 2 days and stop - Next few weeks to several weeks  - do ECHO  -do full PFT  - change Trelegy to BREZTRI 2 puff twice daily -check ono on room air  Followup  - 4 wweeks with APP or Dr Judeth Horn but after completing above

## 2022-10-23 NOTE — Addendum Note (Signed)
Addended by: Suzzanne Cloud E on: 10/23/2022 11:09 AM   Modules accepted: Orders

## 2022-10-23 NOTE — Addendum Note (Signed)
Addended by: Valerie Salts on: 10/23/2022 11:06 AM   Modules accepted: Orders

## 2022-10-23 NOTE — Progress Notes (Addendum)
OV 08/27/21  Joshua Reed is a .77 y.o. man who presents for new patient evaluation of coughing and dyspnea. Symptoms have been progressing for the past few weeks. Cough is worse in the morning and at night, and associated with shortness of breath. Was treated with steroids which finished up about a week ago, along with antibiotics. Prescribed by PCP Dr. Tenny Craw.   He has a history tobacco use disorder - 60 pack years. Quit 3 weeks ago.   Has dyspnea on exertion - can do daily activities of living around the house, but lifting things means he has to use his albuterol rescue inhaler. Uses more when he is sick. Uses about twice a day.    Has seizure disorder and takes keppra from prior head injury.  Former heavy alcohol use, quit 15 years ago.    Social history:  Occupation: Retired from [employer] for 45 years.  Exposures: lives at home with his wife.  Smoking history: 60 years x 1 ppd - 60 pack years      OV 09/25/22  His last visit with me was prescribed prednisone given ongoing symptoms of exacerbation.  He had been on Trelegy prior to meeting me.  Continue Trelegy.  Developed mouth sore on left side of tongue.  Appears like an aphthous ulcer to me.  No sign of thrush.  He has stopped his inhaler for about 10 days.  No worsening breathing.  He is concerned the Trelegy was contributing.  This seems less likely.  77 y.o. male history of COPD on Trelegy whom we are seeing for evaluation of mouth sores.  Most recent pulmonary clinic note from Joshua Croft, NP reviewed.    OV 10/23/2022  Subjective:  Patient ID: Joshua Reed, male , DOB: 1946-04-22 , age 29 y.o. , MRN: [id number] , ADDRESS: [address] [place] [address] PCP Daisy Floro, MD Patient Care Team: Daisy Floro, MD as PCP - General (Family Medicine) Jodelle Red, MD as PCP - Cardiology (Cardiology)  This Provider for this visit: Treatment Team:  Attending Provider: Kalman Shan,  MD    10/23/2022 -   Chief Complaint  Patient presents with   Acute Visit    Cough, sob, legs ache, stomach (pain )      HPI Joshua Reed 77 y.o. -acute visit for this Dr. Lorayne Marek patient.  He has COPD not otherwise specified.  He has previously quit smoking.  Last echocardiogram was in 2012.  Previous eosinophils normal.  Most recent CT scan of the chest without contrast March 2024 with emphysema but no ILD and only with a 4 mm lung nodule.  In this acute visit he and his wife tell me that they stopped the Trelegy because of aphthous ulcer issues.  And his symptoms are now worse for the last 10 days and is very similar to over a month ago when he needed prednisone.  They also believe the spring allergies are bothering him.  He is reporting class III dyspnea.  He is not even able to do simple things although the walking desaturation test today was stable with him dropping 4 points but not below 88%.  Is also coughing and wheezing having nocturnal symptoms.  Is using albuterol quite a bit.  The sputum is white.  Patient does not want antibiotics because of changes to the oral flora in the gut flora because of antibiotics.  The interested in prednisone.  Last time 5-day taper.  This  time the interested in more days.  We discussed frequent COPD exacerbations.  There is no fever or chills no colored sputum.  He has some occasional chest burning.      Simple office walk 185 feet x  3 laps goal with forehead probe 10/23/2022    O2 used ra   Number laps completed 3   Comments about pace mod   Resting Pulse Ox/HR 96% and 61/min   Final Pulse Ox/HR 92% and 92/min   Desaturated </= 88% no   Desaturated <= 3% points yes   Got Tachycardic >/= 90/min yes   Symptoms at end of test no   Miscellaneous comments no    CT CHEST 10/08/22   Narrative & Impression  CLINICAL DATA:  Emphysema.  Abnormal chest CT.  Shortness of breath.   EXAM: CT CHEST WITHOUT CONTRAST    TECHNIQUE: Multidetector CT imaging of the chest was performed following the standard protocol without IV contrast.   RADIATION DOSE REDUCTION: This exam was performed according to the departmental dose-optimization program which includes automated exposure control, adjustment of the mA and/or kV according to patient size and/or use of iterative reconstruction technique.   COMPARISON:  X-ray 08/25/2022 and older CT 06/10/2022 and older   FINDINGS: Cardiovascular: The thoracic aorta on this non IV contrast exam has a normal course and caliber with mild scattered atherosclerotic plaque. Heart is nonenlarged. No pericardial effusion. Coronary artery calcifications are seen.   Mediastinum/Nodes: No specific abnormal lymph node enlargement identified on this non IV contrast exam in the axillary region, hilum or mediastinum. Normal caliber thoracic esophagus.   Lungs/Pleura: Centrilobular emphysematous lung changes are identified particularly along the upper lung zones. No consolidation, pneumothorax or effusion. There is some slight basilar scar atelectasis. Calcified nodule superior segment right lower lobe posteriorly consistent with old granulomatous disease. Stable 4 mm posterior right upper lobe nodule on series 5 image 40. No new lung nodules identified.   Upper Abdomen: The adrenal glands are preserved in the upper abdomen.   Musculoskeletal: Mild degenerative changes seen along the spine. Slight curvature.   IMPRESSION: Stable 4 mm right upper lobe lung nodule. This was larger at 6 mm on the study of August 2023. Although likely benign, if the patient is high-risk, given the morphology and/or location of this nodule a non-contrast chest CT can be considered in 12 months.This recommendation follows the consensus statement: Guidelines for Management of Incidental Pulmonary Nodules Detected on CT Images: From the Fleischner Society 2017; Radiology 2017; 284:228-243.    Aortic Atherosclerosis (ICD10-I70.0) and Emphysema (ICD10-J43.9).     Electronically Signed   By: Karen Kays M.D.   On: 10/11/2022 12:07      Latest Reference Range & Units 05/30/14 04:49 06/13/14 20:33 06/14/14 05:12 06/15/14 05:25 06/16/14 04:40 06/17/14 05:19 06/18/14 06:10 06/19/14 05:14  Eosinophils Absolute 0.0 - 0.7 K/uL 0.1 0.0 0.1 0.0 0.0 0.0 0.0 0.1    has a past medical history of Alcohol abuse, COPD (chronic obstructive pulmonary disease), Hyperlipidemia, Low vitamin B12 level, Macular degeneration, Seizure disorder (2205), Seizures, and Vitamin D deficiency.   reports that he quit smoking about 14 months ago. His smoking use included cigarettes. He smoked an average of .5 packs per day. He has never used smokeless tobacco.  Past Surgical History:  Procedure Laterality Date   CARDIAC CATHETERIZATION  2002   normal   FLEXIBLE SIGMOIDOSCOPY N/A 06/28/2014   Procedure: FLEXIBLE SIGMOIDOSCOPY;  Surgeon: Shirley Friar, MD;  Location: [facility];  Service: Endoscopy;  Laterality: N/A;   INTRAMEDULLARY (IM) NAIL INTERTROCHANTERIC Right 05/24/2014   Procedure: INTRAMEDULLARY (IM) NAIL INTERTROCHANTRIC;  Surgeon: Cheral Almas, MD;  Location: [facility] OR;  Service: Orthopedics;  Laterality: Right;   OPEN REDUCTION INTERNAL FIXATION (ORIF) DISTAL RADIAL FRACTURE Right 05/24/2014   Procedure: OPEN REDUCTION INTERNAL FIXATION (ORIF) DISTAL RADIAL FRACTURE;  Surgeon: Cheral Almas, MD;  Location: [facility] OR;  Service: Orthopedics;  Laterality: Right;    Allergies  Allergen Reactions   Acyclovir And Related Hives    Immunization History  Administered Date(s) Administered   Influenza, High Dose Seasonal PF 07/12/2014, 05/15/2017, 07/29/2018, 05/16/2019   PFIZER(Purple Top)SARS-COV-2 Vaccination 09/25/2019, 10/19/2019   Pneumococcal Conjugate-13 01/10/2015   Pneumococcal Polysaccharide-23 04/14/2013   Tdap 05/24/2014   Zoster, Live 04/14/2013    Family History  Problem  Relation Age of Onset   Heart attack Mother 26   Rheumatic fever Mother    Alzheimer's disease Father    Colon cancer Sister    Heart Problems Brother        open heart surgery   Asthma Child      Current Outpatient Medications:    albuterol (PROVENTIL) (2.5 MG/3ML) 0.083% nebulizer solution, Take 3 mLs (2.5 mg total) by nebulization every 6 (six) hours as needed for wheezing or shortness of breath., Disp: 90 mL, Rfl: 3   albuterol (VENTOLIN HFA) 108 (90 Base) MCG/ACT inhaler, Inhale into the lungs every 6 (six) hours as needed., Disp: , Rfl:    aspirin EC 81 MG tablet, Take 1 tablet (81 mg total) by mouth daily. Swallow whole., Disp: 90 tablet, Rfl: 3   Coenzyme Q10 (CO Q 10 PO), Take 1 tablet by mouth daily., Disp: , Rfl:    Cyanocobalamin (B-12 IJ), Takes injection as directed bi weekly, Disp: , Rfl:    doxycycline (VIBRA-TABS) 100 MG tablet, Take 1 tablet (100 mg total) by mouth 2 (two) times daily., Disp: 14 tablet, Rfl: 0   ezetimibe (ZETIA) 10 MG tablet, Take 1 tablet (10 mg total) by mouth daily., Disp: 30 tablet, Rfl: 6   fluticasone (FLONASE) 50 MCG/ACT nasal spray, Place 2 sprays into both nostrils daily., Disp: 18.2 mL, Rfl: 2   Fluticasone-Umeclidin-Vilant (TRELEGY ELLIPTA) 100-62.5-25 MCG/ACT AEPB, Inhale 1 puff into the lungs daily., Disp: 1 each, Rfl: 3   levETIRAcetam (KEPPRA) 1000 MG tablet, Take 1 tablet (1,000 mg total) by mouth 2 (two) times daily., Disp: 180 tablet, Rfl: 3   levocetirizine (XYZAL) 5 MG tablet, Take 1 tablet (5 mg total) by mouth every evening., Disp: 90 tablet, Rfl: 1   Multiple Vitamin (MULTIVITAMIN WITH MINERALS) TABS tablet, Take 1 tablet by mouth daily., Disp: , Rfl:    Omega-3 Fatty Acids (FISH OIL PO), Take 1 capsule by mouth daily., Disp: , Rfl:    Vitamin D, Ergocalciferol, (DRISDOL) 50000 UNITS CAPS capsule, Take 50,000 Units by mouth once a week., Disp: , Rfl: 0      Objective:   Vitals:   10/23/22 0959  BP: 118/64  Pulse: 69  Temp:  97.6 F (36.4 C)  TempSrc: Oral  SpO2: 97%  Weight: 131 lb 12.8 oz (59.8 kg)  Height: 5\' 7"  (1.702 m)    Estimated body mass index is 20.64 kg/m as calculated from the following:   Height as of this encounter: 5\' 7"  (1.702 m).   Weight as of this encounter: 131 lb 12.8 oz (59.8 kg).  @WEIGHTCHANGE @  [employer]   10/23/22 0959  Weight: 131 lb  12.8 oz (59.8 kg)     Physical Exam   General: No distress. Looks well. THINK Neuro: Alert and Oriented x 3. GCS 15. Speech normal Psych: Pleasant Resp:  Barrel Chest - yes mild.  Wheeze - no, Crackles - no, No overt respiratory distress CVS: Normal heart sounds. Murmurs - no Ext: Stigmata of Connective Tissue Disease - no HEENT: Normal upper airway. PEERL +. No post nasal drip        Assessment:       ICD-10-CM   1. COPD with acute exacerbation (HCC)  J44.1 CBC with Differential    Perennial allergen profile IgE    B Nat Peptide    Alpha-1 antitrypsin phenotype    ECHOCARDIOGRAM COMPLETE    Pulmonary function test    Pulse oximetry, overnight    Alpha-1 antitrypsin phenotype    B Nat Peptide    Perennial allergen profile IgE    CBC with Differential    CANCELED: Troponin I    CANCELED: Troponin T, High Sensitivity (hs-TnT)    2. COPD, frequent exacerbations (HCC)  J44.1 Troponin I (High Sensitivity)    Troponin I (High Sensitivity)    3. DOE (dyspnea on exertion)  R06.09     4. History of CAD (coronary artery disease)  Z86.79          Plan:     Patient Instructions     ICD-10-CM   1. COPD with acute exacerbation (HCC)  J44.1 CBC with Differential    Perennial allergen profile IgE    B Nat Peptide    Alpha-1 antitrypsin phenotype    ECHOCARDIOGRAM COMPLETE    Pulmonary function test    Pulse oximetry, overnight    Alpha-1 antitrypsin phenotype    B Nat Peptide    Perennial allergen profile IgE    CBC with Differential    CANCELED: Troponin I    CANCELED: Troponin T, High Sensitivity (hs-TnT)     2. COPD, frequent exacerbations (HCC)  J44.1 Troponin I (High Sensitivity)    Troponin I (High Sensitivity)    3. DOE (dyspnea on exertion)  R06.09     4. History of CAD (coronary artery disease)  Z86.79      Repeated flare up . Currently in flare up prossibly due to spring allergies and not taking trelegy due to oral lesion  Plan - do blood work 10/23/2022   - cbc with diff, RAST allergy panel . BNP and troponin  - check alpha 1 AT phenotpye  - Take prednisone 40 mg daily x 2 days, then 20mg  daily x 2 days, then 10mg  daily x 2 days, then 5mg  daily x 2 days and stop - Next few weeks to several weeks  - do ECHO  -do full PFT  - change Trelegy to BREZTRI 2 puff twice daily -check ono on room air  Followup  - 4 wweeks with APP or Dr Judeth Horn but after completing above     SIGNATURE    Dr. Kalman Shan, M.D., F.C.C.P,  Pulmonary and Critical Care Medicine Staff Physician, [facility] Director - Interstitial Lung Disease  Program  Pulmonary Fibrosis [facility] at [facility] [place], [place], [address]  Pager: [phone], If no answer or between  15:00h - 7:00h: call [phone] Telephone: [phone]  10:29 AM 10/23/2022

## 2022-10-24 ENCOUNTER — Telehealth: Payer: Self-pay | Admitting: Internal Medicine

## 2022-10-24 ENCOUNTER — Ambulatory Visit: Payer: No Typology Code available for payment source | Admitting: Nurse Practitioner

## 2022-10-24 NOTE — Telephone Encounter (Signed)
  Blood EOS very hight. Will qualify for dupoixent to Rx AECOPD. He will need to reach out to Dr [facility] about this at followup   Latest Reference Range & Units 05/30/14 04:49 06/13/14 20:33 06/14/14 05:12 06/15/14 05:25 06/16/14 04:40 06/17/14 05:19 06/18/14 06:10 06/19/14 05:14 10/23/22 10:36  Eosinophils Absolute 0.0 - 0.7 K/uL 0.1 0.0 0.1 0.0 0.0 0.0 0.0 0.1 0.7

## 2022-10-28 LAB — ALLERGEN PROFILE, PERENNIAL ALLERGEN IGE
Alternaria Alternata IgE: <0.1 kU/L
Aspergillus Fumigatus IgE: <0.1 kU/L
Aureobasidi Pullulans IgE: <0.1 kU/L
Candida Albicans IgE: 0.2 kU/L — AB
Cat Dander IgE: <0.1 kU/L
Chicken Feathers IgE: <0.1 kU/L
Cladosporium Herbarum IgE: <0.1 kU/L
Cow Dander IgE: 0.11 kU/L — AB
D Farinae IgE: 0.99 kU/L — AB
D Pteronyssinus IgE: 0.75 kU/L — AB
Dog Dander IgE: <0.1 kU/L
Duck Feathers IgE: <0.1 kU/L
Goose Feathers IgE: <0.1 kU/L
Mouse Urine IgE: <0.1 kU/L
Mucor Racemosus IgE: <0.1 kU/L
Penicillium Chrysogen IgE: <0.1 kU/L
Phoma Betae IgE: <0.1 kU/L
Setomelanomma Rostrat: <0.1 kU/L
Stemphylium Herbarum IgE: <0.1 kU/L

## 2022-10-28 NOTE — Telephone Encounter (Signed)
Patient's wife called to ask if the nurse or doctor could call to go over the blood tests.  She stated she has tried to get into her Mychart but cannot manage to see how to read them.  Please advise and call patient back to discuss the results.  CB# [phone]

## 2022-10-29 NOTE — Telephone Encounter (Signed)
Darl Pikes wife checking on lab results. Darl Pikes phone number is [phone].

## 2022-10-29 NOTE — Progress Notes (Signed)
Has dustt mite allergy. During followup with Rhunette Croft please discuss measuresa gainst this. We will not call with results

## 2022-10-30 LAB — ALPHA-1 ANTITRYPSIN PHENOTYPE: A-1 Antitrypsin, Ser: 148 mg/dL (ref 83–199)

## 2022-10-30 NOTE — Telephone Encounter (Signed)
Spoke with pt's wife Darl Pikes (per [facility]) and reviewed blood work results as dictated by Dr. Marchelle Gearing. Darl Pikes stated understanding.   When RN attempted to schedule f/u with Dr. Alysia Penna did state pt is interested is switching providers from Dr. Sheldon Silvan to Dr. Marchelle Gearing. Darl Pikes was informed or office policy stating both providers would have to agree on switch.   Dr. Marchelle Gearing please advise.   Dr. Judeth Horn please advise.

## 2022-10-31 NOTE — Telephone Encounter (Signed)
Tht is fine with me I ok wth Dr Judeth Horn.

## 2022-11-11 ENCOUNTER — Ambulatory Visit (HOSPITAL_COMMUNITY)
Admission: RE | Admit: 2022-11-11 | Discharge: 2022-11-11 | Disposition: A | Discharge status: Home / Self Care | Payer: No Typology Code available for payment source | Source: Ambulatory Visit | Attending: Internal Medicine | Admitting: Internal Medicine

## 2022-11-11 DIAGNOSIS — R0600 Dyspnea, unspecified: Secondary | ICD-10-CM | POA: Insufficient documentation

## 2022-11-11 DIAGNOSIS — I2510 Atherosclerotic heart disease of native coronary artery without angina pectoris: Secondary | ICD-10-CM | POA: Diagnosis not present

## 2022-11-11 DIAGNOSIS — R0609 Other forms of dyspnea: Secondary | ICD-10-CM

## 2022-11-11 DIAGNOSIS — J441 Chronic obstructive pulmonary disease with (acute) exacerbation: Secondary | ICD-10-CM | POA: Insufficient documentation

## 2022-11-11 DIAGNOSIS — E785 Hyperlipidemia, unspecified: Secondary | ICD-10-CM | POA: Diagnosis not present

## 2022-11-11 DIAGNOSIS — J449 Chronic obstructive pulmonary disease, unspecified: Secondary | ICD-10-CM | POA: Insufficient documentation

## 2022-11-11 LAB — ECHOCARDIOGRAM COMPLETE
Area-P 1/2: 3.85 cm2
Calc EF: 58.1 %
S' Lateral: 3.3 cm
Single Plane A2C EF: 59.9 %
Single Plane A4C EF: 57.2 %

## 2022-11-11 NOTE — Progress Notes (Signed)
Echocardiogram 2D Echocardiogram has been performed.  Joshua Reed 11/11/2022, 1:53 PM

## 2022-11-13 DIAGNOSIS — Z85828 Personal history of other malignant neoplasm of skin: Secondary | ICD-10-CM | POA: Diagnosis not present

## 2022-11-13 DIAGNOSIS — L812 Freckles: Secondary | ICD-10-CM | POA: Diagnosis not present

## 2022-11-13 DIAGNOSIS — L821 Other seborrheic keratosis: Secondary | ICD-10-CM | POA: Diagnosis not present

## 2022-11-13 DIAGNOSIS — L820 Inflamed seborrheic keratosis: Secondary | ICD-10-CM | POA: Diagnosis not present

## 2022-11-13 DIAGNOSIS — L570 Actinic keratosis: Secondary | ICD-10-CM | POA: Diagnosis not present

## 2022-11-17 DIAGNOSIS — Z125 Encounter for screening for malignant neoplasm of prostate: Secondary | ICD-10-CM | POA: Diagnosis not present

## 2022-11-17 DIAGNOSIS — E559 Vitamin D deficiency, unspecified: Secondary | ICD-10-CM | POA: Diagnosis not present

## 2022-11-17 DIAGNOSIS — E538 Deficiency of other specified B group vitamins: Secondary | ICD-10-CM | POA: Diagnosis not present

## 2022-11-17 DIAGNOSIS — Z79899 Other long term (current) drug therapy: Secondary | ICD-10-CM | POA: Diagnosis not present

## 2022-11-17 DIAGNOSIS — E7800 Pure hypercholesterolemia, unspecified: Secondary | ICD-10-CM | POA: Diagnosis not present

## 2022-11-19 DIAGNOSIS — Z125 Encounter for screening for malignant neoplasm of prostate: Secondary | ICD-10-CM | POA: Diagnosis not present

## 2022-11-19 DIAGNOSIS — J449 Chronic obstructive pulmonary disease, unspecified: Secondary | ICD-10-CM | POA: Diagnosis not present

## 2022-11-19 DIAGNOSIS — E538 Deficiency of other specified B group vitamins: Secondary | ICD-10-CM | POA: Diagnosis not present

## 2022-11-19 DIAGNOSIS — E7800 Pure hypercholesterolemia, unspecified: Secondary | ICD-10-CM | POA: Diagnosis not present

## 2022-11-19 DIAGNOSIS — Z0000 Encounter for general adult medical examination without abnormal findings: Secondary | ICD-10-CM | POA: Diagnosis not present

## 2022-11-19 DIAGNOSIS — D510 Vitamin B12 deficiency anemia due to intrinsic factor deficiency: Secondary | ICD-10-CM | POA: Diagnosis not present

## 2022-11-19 DIAGNOSIS — I739 Peripheral vascular disease, unspecified: Secondary | ICD-10-CM | POA: Diagnosis not present

## 2022-11-19 DIAGNOSIS — I700 Atherosclerosis of aorta: Secondary | ICD-10-CM | POA: Diagnosis not present

## 2022-11-19 DIAGNOSIS — Z6821 Body mass index (BMI) 21.0-21.9, adult: Secondary | ICD-10-CM | POA: Diagnosis not present

## 2022-11-19 DIAGNOSIS — F1021 Alcohol dependence, in remission: Secondary | ICD-10-CM | POA: Diagnosis not present

## 2022-11-19 DIAGNOSIS — E559 Vitamin D deficiency, unspecified: Secondary | ICD-10-CM | POA: Diagnosis not present

## 2022-11-19 DIAGNOSIS — G40909 Epilepsy, unspecified, not intractable, without status epilepticus: Secondary | ICD-10-CM | POA: Diagnosis not present

## 2022-12-01 NOTE — Telephone Encounter (Signed)
Thank you Dr.R. Now we just need Dr. Derwood Kaplan OK.  PT stated she really liked both Dr's but Dr. Elvera Lennox "just gave [place] so much information."   Please ask Dr. Rexene Edison f we have HIS OK to sched to switch Dr's. TY.

## 2022-12-02 ENCOUNTER — Other Ambulatory Visit (HOSPITAL_BASED_OUTPATIENT_CLINIC_OR_DEPARTMENT_OTHER): Payer: Self-pay | Admitting: Gastroenterology

## 2022-12-02 DIAGNOSIS — Z1211 Encounter for screening for malignant neoplasm of colon: Secondary | ICD-10-CM | POA: Diagnosis not present

## 2022-12-02 DIAGNOSIS — Z8719 Personal history of other diseases of the digestive system: Secondary | ICD-10-CM | POA: Diagnosis not present

## 2022-12-02 DIAGNOSIS — R140 Abdominal distension (gaseous): Secondary | ICD-10-CM | POA: Diagnosis not present

## 2022-12-02 DIAGNOSIS — K52832 Lymphocytic colitis: Secondary | ICD-10-CM | POA: Diagnosis not present

## 2022-12-03 ENCOUNTER — Ambulatory Visit: Payer: No Typology Code available for payment source | Admitting: Nurse Practitioner

## 2022-12-03 ENCOUNTER — Encounter: Payer: Self-pay | Admitting: Nurse Practitioner

## 2022-12-03 ENCOUNTER — Ambulatory Visit (INDEPENDENT_AMBULATORY_CARE_PROVIDER_SITE_OTHER): Payer: No Typology Code available for payment source | Admitting: Nurse Practitioner

## 2022-12-03 ENCOUNTER — Encounter (HOSPITAL_BASED_OUTPATIENT_CLINIC_OR_DEPARTMENT_OTHER): Payer: No Typology Code available for payment source

## 2022-12-03 VITALS — BP 114/72 | HR 62 | Temp 97.7°F | Ht 68.0 in | Wt 140.8 lb

## 2022-12-03 DIAGNOSIS — R109 Unspecified abdominal pain: Secondary | ICD-10-CM | POA: Diagnosis not present

## 2022-12-03 DIAGNOSIS — G4719 Other hypersomnia: Secondary | ICD-10-CM

## 2022-12-03 DIAGNOSIS — J302 Other seasonal allergic rhinitis: Secondary | ICD-10-CM

## 2022-12-03 DIAGNOSIS — J449 Chronic obstructive pulmonary disease, unspecified: Secondary | ICD-10-CM | POA: Diagnosis not present

## 2022-12-03 MED ORDER — MONTELUKAST SODIUM 10 MG PO TABS
10.0000 mg | ORAL_TABLET | Freq: Every day | ORAL | 5 refills | Status: DC
Start: 2022-12-03 — End: 2023-10-27

## 2022-12-03 NOTE — Progress Notes (Signed)
@Patient  ID: Joshua Reed, male    DOB: 02-13-1946, 77 y.o.   MRN: [id number]  Chief Complaint  Patient presents with   Follow-up    Painful,tight stomach.     Referring provider: Daisy Floro, MD  HPI: 77 year old male, former smoker (60 pack years) followed for COPD with chronic bronchitis and emphysema.Last seen in office on 10/23/2022 by Dr. Marchelle Gearing for acute visit. He is followed by the lung cancer screening program with last LDCT chest on 06/11/2022. Past medical history significant for seizure disorder, ETOH abuse, macular degeneration, severe protein calorie malnutrition, tobacco abuse, HLD.   TEST/EVENTS:  10/20/2016 Spirometry: FVC 1.8 (45), FEV1 0.9 (29), ratio 47. Very severe obstructive airway disease.  03/08/2021 LDCT chest: atherosclerosis. Centrilobular emphysema. Biapical pleuroparenchymal scarring. Calcified RLL granuloma. No nodules present - Lung-RADS1  06/10/2022 LDCT chest: Lung RADS 2. CAD. 10/08/2022 CT chest wo con: stable 4 mm RUL nodule, larger on CT August 2023. Likely benign. Emphysema. No acute process. Atheroslcerosis.   08/27/2021: OV with Dr. Celine Mans. Evidence of emphysema on CT chest. Experiences DOE; able to complete daily activities but difficulties lifting things. Using albuterol Twice daily. Not on any maintenance therapies - wanted to hold off. PFTs ordered. Recently quit smoking 07/2021.   09/19/2021: Ov with Cale Bethard NP for worsening in his breathing. He contacted the office around a week ago and decided he wanted to be started on a daily maintenance inhaler so Trelegy was sent to his pharmacy. He started this yesterday; however, last night he had a scary episode with terrible shortness of breath. He used his daughter's nebulizer machine and felt some relief afterwards. Today, he reports that his breathing is slightly better but he still has more shortness of breath than he normally does. He hasn't noticed a lot of wheezing but has some noisy breathing  every now and then. He has been coughing frequently. He reports that he occasionally gets up some clear sputum but other times it's nonproductive and congested. Using albuterol a few times a day. Allergy symptoms have also flared over the past few weeks with nasal drainage and a tickle in his throat. He's tried multiple OTC allergy medicines in the past without much relief. Treated for AECOPD with z pack and prednisone. Target allergy symptoms.  07/25/2022: OV with Oluwatoyin Banales NP for overdue follow up. He has been doing okay since he was here last. Breathing has been stable since he was treated for exacerbation in March. No antibiotics or steroids. No hospitalizations. He's able to complete daily activities without significant burden. Finds that he gets most winded when he's carrying something or trying to do work around the house. His cough is at baseline; minimally productive with white sputum. No increased congestion. He is on Trelegy. Uses albuterol a few times a week. He wants to get a new nebulizer machine; lost his old one. He had low dose lung cancer CT with lung RADS 2. Plan for repeat in November 2024.   08/25/2022: OV with Dr. Judeth Horn for acute visit. Dr. Celine Mans out of office. Treated for AECOPD with doxycycline and prednisone.   09/25/2022: OV with Dr. Judeth Horn for follow up. Treated for AECOPD at last visit. Using Trelegy daily. Developed mouth sore on left side of tongue. Appears liek an aphthous ulcer to me. No sign of thrush. Stopped inhaler for about 10 days. No worsening breathing. Prescribed nystatin.   10/23/2022: OV with Dr. Marchelle Gearing. CT chest from March 2024 with emphysema; no ILD and only  a 4 mm lung nodule. Symptoms worse over the past 10 days, similar to when he needed prednisone. Spring allergies are also very bothersome. Coughing, wheezing, nocturnal symptoms. Using albuterol quite a bit. Does not want abx. Interested in prednisone. Blood work - CBC with diff, RAST panel, BNP, troponin, A1AT.  Prednisone taper. Echo and full PFT ordered. Change Trelegy to [employer]. ONO on room air.  12/03/2022: Today - follow up Patient presents today for follow up. He has been doing better since his last visit and completing steroids. Started Breztri and hasn't had any more sores in his mouth. He doesn't know if he notices a huge change compared to the Trelegy, as far as his breathing goes. He does have trouble with longer distances and uphill climbing. He usually has to stop when he is mowing the grass to catch his breath. He has a chronic daily cough. Doesn't notice much wheezing now. He does have daily allergy symptoms with nasal congestion, drainage, and throat clearing. He denies any hemoptysis, wheezing, chills, fevers, night sweats, leg swelling, orthopnea. Uses albuterol 1-2 times a day, which is baseline for him. Takes flonase and xyzal for his allergies. His wife wants him to start being more active.  She's also worried about his sleep at night. She says that he has weird dreams that frequently wake him up. He's tired most days and dozes off when he's not doing much. She says she's questioned if he's been breathing at night. He has dry mouth at night. He denies any snoring, drowsy driving, sleep talking/walking, sleep paralysis. Never had a previous sleep study. Wife is worried he has sleep apnea.   Allergies  Allergen Reactions   Acyclovir And Related Hives    Immunization History  Administered Date(s) Administered   Influenza, High Dose Seasonal PF 07/12/2014, 05/15/2017, 07/29/2018, 05/16/2019   PFIZER(Purple Top)SARS-COV-2 Vaccination 09/25/2019, 10/19/2019   Pneumococcal Conjugate-13 01/10/2015   Pneumococcal Polysaccharide-23 04/14/2013   Tdap 05/24/2014   Zoster, Live 04/14/2013    Past Medical History:  Diagnosis Date   Alcohol abuse    COPD (chronic obstructive pulmonary disease) (HCC)    Hyperlipidemia    Low vitamin B12 level    Macular degeneration    Seizure disorder (HCC)  2205   ALCOHOL RELATED   Seizures (HCC)    Vitamin D deficiency     Tobacco History: Social History   Tobacco Use  Smoking Status Former   Packs/day: .5   Types: Cigarettes   Quit date: 08/06/2021   Years since quitting: 1.3  Smokeless Tobacco Never   Counseling given: Not Answered   Outpatient Medications Prior to Visit  Medication Sig Dispense Refill   albuterol (PROVENTIL) (2.5 MG/3ML) 0.083% nebulizer solution Take 3 mLs (2.5 mg total) by nebulization every 6 (six) hours as needed for wheezing or shortness of breath. 90 mL 3   albuterol (VENTOLIN HFA) 108 (90 Base) MCG/ACT inhaler Inhale into the lungs every 6 (six) hours as needed.     Coenzyme Q10 (CO Q 10 PO) Take 1 tablet by mouth daily.     Cyanocobalamin (B-12 IJ) Takes injection as directed bi weekly     doxycycline (VIBRA-TABS) 100 MG tablet Take 1 tablet (100 mg total) by mouth 2 (two) times daily. 14 tablet 0   ezetimibe (ZETIA) 10 MG tablet Take 1 tablet (10 mg total) by mouth daily. 30 tablet 6   fluticasone (FLONASE) 50 MCG/ACT nasal spray Place 2 sprays into both nostrils daily. 18.2 mL 2  Fluticasone-Umeclidin-Vilant (TRELEGY ELLIPTA) 100-62.5-25 MCG/ACT AEPB Inhale 1 puff into the lungs daily. 1 each 3   levETIRAcetam (KEPPRA) 1000 MG tablet Take 1 tablet (1,000 mg total) by mouth 2 (two) times daily. 180 tablet 3   levocetirizine (XYZAL) 5 MG tablet Take 1 tablet (5 mg total) by mouth every evening. 90 tablet 1   Multiple Vitamin (MULTIVITAMIN WITH MINERALS) TABS tablet Take 1 tablet by mouth daily.     Omega-3 Fatty Acids (FISH OIL PO) Take 1 capsule by mouth daily.     Vitamin D, Ergocalciferol, (DRISDOL) 50000 UNITS CAPS capsule Take 50,000 Units by mouth once a week.  0   aspirin EC 81 MG tablet Take 1 tablet (81 mg total) by mouth daily. Swallow whole. (Patient not taking: Reported on 12/03/2022) 90 tablet 3   predniSONE (DELTASONE) 10 MG tablet Take 4 tabs for 2 days, then 3 tabs for 2 days, 2 tabs for 2  days, then 1 tab for 2 days, then stop. (Patient not taking: Reported on 12/03/2022) 20 tablet 0   No facility-administered medications prior to visit.     Review of Systems:   Constitutional: No weight loss or gain, night sweats, fevers, chills. +fatigue HEENT: No headaches, difficulty swallowing, tooth/dental problems, or sore throat. No sneezing, itching, ear ache, postnasal drip. +occasional nasal congestion. +dry mouth CV:  No chest pain, orthopnea, PND, swelling in lower extremities, anasarca, dizziness, palpitations, syncope Resp: +shortness of breath with exertion; chronic cough. No excess mucus or change in color of mucus. No hemoptysis. No wheezing.  No chest wall deformity GI:  No heartburn, indigestion, abdominal pain, nausea, vomiting, loss of appetite GU: No dysuria, change in color of urine, urgency or frequency.   Skin: No rash, lesions, ulcerations MSK:  No joint pain or swelling.   Neuro: No dizziness or lightheadedness.  Psych: No depression or anxiety. Mood stable. +sleep disturbance, weird dreams    Physical Exam:  BP 114/72   Pulse 62   Temp 97.7 F (36.5 C) (Oral)   Ht 5\' 8"  (1.727 m)   Wt 140 lb 12.8 oz (63.9 kg)   SpO2 98%   BMI 21.41 kg/m   GEN: Pleasant, interactive, well-appearing; in no acute distress. HEENT:  Normocephalic and atraumatic. PERRLA. Sclera white. Nasal turbinates pink, moist and patent bilaterally. No rhinorrhea present. Oropharynx pink and moist, without exudate or edema. No lesions, ulcerations. Mallampati II NECK:  Supple w/ fair ROM. No JVD present. Normal carotid impulses w/o bruits. Thyroid symmetrical with no goiter or nodules palpated. No lymphadenopathy.   CV: RRR, no m/r/g, no peripheral edema. Pulses intact, +2 bilaterally. No cyanosis, pallor or clubbing. PULMONARY:  Unlabored, regular breathing. Diminished bases bilaterally A&P w/o wheezes/rales/rhonchi. No accessory muscle use. No dullness to percussion. GI: BS present and  normoactive. Soft, non-tender to palpation. No organomegaly or masses detected.  MSK: No erythema, warmth or tenderness. Cap refil <2 sec all extrem. No deformities or joint swelling noted.  Neuro: A/Ox3. No focal deficits noted.   Skin: Warm, no lesions or rashe Psych: Normal affect and behavior. Judgement and thought content appropriate.     Lab Results:  CBC    Component Value Date/Time   WBC 7.0 10/23/2022 1036   RBC 4.62 10/23/2022 1036   HGB 14.2 10/23/2022 1036   HCT 42.7 10/23/2022 1036   PLT 261.0 10/23/2022 1036   MCV 92.4 10/23/2022 1036   MCH 30.2 07/03/2014 0511   MCHC 33.2 10/23/2022 1036   RDW 13.8 10/23/2022  1036   LYMPHSABS 1.9 10/23/2022 1036   MONOABS 0.7 10/23/2022 1036   EOSABS 0.7 10/23/2022 1036   BASOSABS 0.0 10/23/2022 1036    BMET    Component Value Date/Time   NA 134 (L) 07/03/2014 0511   K 4.2 07/03/2014 0511   CL 100 07/03/2014 0511   CO2 25 07/03/2014 0511   GLUCOSE 80 07/03/2014 0511   BUN 7 07/03/2014 0511   CREATININE 0.59 07/03/2014 0511   CALCIUM 7.8 (L) 07/03/2014 0511   GFRNONAA >90 07/03/2014 0511   GFRAA >90 07/03/2014 0511    BNP No results found for: "BNP"   Imaging:  ECHOCARDIOGRAM COMPLETE  Result Date: 11/11/2022    ECHOCARDIOGRAM REPORT   Patient Name:   BRICK SODERBERG Date of Exam: 11/11/2022 Medical Rec #:  [id number]        Height:       67.0 in Accession #:    [id number]       Weight:       131.8 lb Date of Birth:  11-06-1945        BSA:          1.694 m Patient Age:    76 years         BP:           118/64 mmHg Patient Gender: M                HR:           75 bpm. Exam Location:  Outpatient Procedure: 3D Echo, 2D Echo, Cardiac Doppler and Color Doppler Indications:    Dyspnea R06.00  History:        Patient has prior history of Echocardiogram examinations, most                 recent 05/21/2011. COPD, Signs/Symptoms:Dyspnea; Risk                 Factors:Dyslipidemia and ETOH.  Sonographer:    Eulah Pont RDCS  Referring Phys: [id number] MURALI RAMASWAMY IMPRESSIONS  1. Left ventricular ejection fraction, by estimation, is 60 to 65%. Left ventricular ejection fraction by 3D volume is 60 %. The left ventricle has normal function. The left ventricle has no regional wall motion abnormalities. Left ventricular diastolic  parameters are consistent with Grade I diastolic dysfunction (impaired relaxation).  2. Right ventricular systolic function is normal. The right ventricular size is normal.  3. The mitral valve is normal in structure. No evidence of mitral valve regurgitation. No evidence of mitral stenosis.  4. The aortic valve is normal in structure. There is mild calcification of the aortic valve. There is mild thickening of the aortic valve. Aortic valve regurgitation is not visualized. Aortic valve sclerosis/calcification is present, without any evidence of aortic stenosis.  5. The inferior vena cava is normal in size with greater than 50% respiratory variability, suggesting right atrial pressure of 3 mmHg. FINDINGS  Left Ventricle: Left ventricular ejection fraction, by estimation, is 60 to 65%. Left ventricular ejection fraction by 3D volume is 60 %. The left ventricle has normal function. The left ventricle has no regional wall motion abnormalities. The left ventricular internal cavity size was normal in size. There is no left ventricular hypertrophy. Left ventricular diastolic parameters are consistent with Grade I diastolic dysfunction (impaired relaxation). Right Ventricle: The right ventricular size is normal. No increase in right ventricular wall thickness. Right ventricular systolic function is normal. Left Atrium: Left atrial size was normal in size. Right  Atrium: Right atrial size was normal in size. Pericardium: There is no evidence of pericardial effusion. Mitral Valve: The mitral valve is normal in structure. No evidence of mitral valve regurgitation. No evidence of mitral valve stenosis. Tricuspid Valve: The  tricuspid valve is normal in structure. Tricuspid valve regurgitation is not demonstrated. No evidence of tricuspid stenosis. Aortic Valve: The aortic valve is normal in structure. There is mild calcification of the aortic valve. There is mild thickening of the aortic valve. Aortic valve regurgitation is not visualized. Aortic valve sclerosis/calcification is present, without any evidence of aortic stenosis. Pulmonic Valve: The pulmonic valve was normal in structure. Pulmonic valve regurgitation is not visualized. No evidence of pulmonic stenosis. Aorta: The aortic root is normal in size and structure. Venous: The inferior vena cava is normal in size with greater than 50% respiratory variability, suggesting right atrial pressure of 3 mmHg. IAS/Shunts: No atrial level shunt detected by color flow Doppler.  LEFT VENTRICLE PLAX 2D LVIDd:         4.20 cm         Diastology LVIDs:         3.30 cm         LV e' medial:    7.83 cm/s LV PW:         0.80 cm         LV E/e' medial:  6.6 LV IVS:        0.80 cm         LV e' lateral:   10.40 cm/s LVOT diam:     2.20 cm         LV E/e' lateral: 5.0 LV SV:         64 LV SV Index:   38 LVOT Area:     3.80 cm        3D Volume EF                                LV 3D EF:    Left                                             ventricul LV Volumes (MOD)                            ar LV vol d, MOD    55.9 ml                    ejection A2C:                                        fraction LV vol d, MOD    69.6 ml                    by 3D A4C:                                        volume is LV vol s, MOD    22.4 ml  60 %. A2C: LV vol s, MOD    29.8 ml A4C:                           3D Volume EF: LV SV MOD A2C:   33.5 ml       3D EF:        60 % LV SV MOD A4C:   69.6 ml       LV EDV:       102 ml LV SV MOD BP:    38.5 ml       LV ESV:       41 ml                                LV SV:        61 ml RIGHT VENTRICLE RV S prime:     12.90 cm/s TAPSE (M-mode): 2.2 cm LEFT ATRIUM              Index        RIGHT ATRIUM           Index LA diam:        3.40 cm 2.01 cm/m   RA Area:     11.70 cm LA Vol (A2C):   34.2 ml 20.19 ml/m  RA Volume:   22.50 ml  13.28 ml/m LA Vol (A4C):   39.0 ml 23.03 ml/m LA Biplane Vol: 38.4 ml 22.67 ml/m  AORTIC VALVE LVOT Vmax:   85.50 cm/s LVOT Vmean:  57.600 cm/s LVOT VTI:    0.169 m  AORTA Ao Root diam: 3.30 cm Ao Asc diam:  3.70 cm MITRAL VALVE MV Area (PHT): 3.85 cm    SHUNTS MV Decel Time: 197 msec    Systemic VTI:  0.17 m MV E velocity: 52.00 cm/s  Systemic Diam: 2.20 cm MV A velocity: 68.80 cm/s MV E/A ratio:  0.76 Donato Schultz MD Electronically signed by Donato Schultz MD Signature Date/Time: 11/11/2022/2:14:27 PM    Final            No data to display          No results found for: "NITRICOXIDE"      Assessment & Plan:   COPD, very severe (HCC) Very severe COPD with high symptom burden and prone to exacerbations. Clinically improved with recent steroid course. Suspect his nasal symptoms are contributing to his DOE. He will continue triple therapy regimen with Breztri. Add on singulair for trigger prevention. Referral to pulmonary rehab. Action plan in place. Could consider biologic therapy with dupixent or daliresp in the future, depending on response to above recommendations.  Patient Instructions  Continue Breztri 2 puffs Twice daily. Brush tongue and rinse mouth afterwards Continue Albuterol inhaler 2 puffs or 3 mL neb every 6 hours as needed for shortness of breath or wheezing. Notify if symptoms persist despite rescue inhaler/neb use.  Continue flonase 2 sprays each nostril daily Continue xyzal 1 tab daily   Singulair (montelukast) 1 tab At bedtime for allergies   Attend pulmonary function testing as scheduled - 6/17 at 930 am  Referral to pulmonary rehab  Attend appointment with GI as scheduled   Given your symptoms, I am concerned that you may have sleep disordered breathing with sleep apnea. You will need a  sleep study for further evaluation. Someone will contact you to schedule this.   We  discussed how untreated sleep apnea puts an individual at risk for cardiac arrhthymias, pulm HTN, DM, stroke and increases their risk for daytime accidents. We also briefly reviewed treatment options including weight loss, side sleeping position, oral appliance, CPAP therapy or referral to ENT for possible surgical options  Use caution when driving and pull over if you become sleepy.  Follow up June 17th with Dr. Judeth Horn at 130 after your PFT at the [facility] office. If symptoms do not improve or worsen, please contact office for sooner follow up or seek emergency care.    Allergic rhinitis Continue daily antihistamine and intranasal steroid. Positive RAST to dust and elevated eosinophil count to 700. Start singulair. Re-evaluate response at follow up.  Excessive daytime sleepiness He excessive daytime sleepiness, nocturnal apneic events, restless sleep. Given this,  I am concerned he could have sleep disordered breathing with obstructive sleep apnea. He will need sleep study for further evaluation.    - had an extensive discussion regarding the adverse health consequences related to untreated sleep disordered breathing - specifically discussed the risks for hypertension, coronary artery disease, cardiac dysrhythmias, cerebrovascular disease, and diabetes - lifestyle modification discussed   - discussed how sleep disruption can increase risk of accidents, particularly when driving - safe driving practices were discussed   Abdominal pain Undergoing workup with GI. No acute worsening.   I spent 45 minutes of dedicated to the care of this patient on the date of this encounter to include pre-visit review of records, face-to-face time with the patient discussing conditions above, post visit ordering of testing, clinical documentation with the electronic health record, making appropriate referrals as  documented, and communicating necessary findings to members of the patients care team.  Noemi Chapel, NP 12/03/2022  Pt aware and understands NP's role. Marland Kitchen

## 2022-12-03 NOTE — Telephone Encounter (Signed)
Happy to see him. He has been seen by multiple doctors over the last couple of years - needs stability in who he sees in follow up.

## 2022-12-03 NOTE — Telephone Encounter (Signed)
Spoke with patients wife. Advised Dr. Judeth Horn will see patient. Patient will keep apt with Florentina Addison today and f/u with Dr. Judeth Horn after

## 2022-12-03 NOTE — Assessment & Plan Note (Signed)
He excessive daytime sleepiness, nocturnal apneic events, restless sleep. Given this,  I am concerned he could have sleep disordered breathing with obstructive sleep apnea. He will need sleep study for further evaluation.    - had an extensive discussion regarding the adverse health consequences related to untreated sleep disordered breathing - specifically discussed the risks for hypertension, coronary artery disease, cardiac dysrhythmias, cerebrovascular disease, and diabetes - lifestyle modification discussed   - discussed how sleep disruption can increase risk of accidents, particularly when driving - safe driving practices were discussed

## 2022-12-03 NOTE — Assessment & Plan Note (Addendum)
Continue daily antihistamine and intranasal steroid. Positive RAST to dust and elevated eosinophil count to 700. Start singulair. Re-evaluate response at follow up.

## 2022-12-03 NOTE — Assessment & Plan Note (Addendum)
Undergoing workup with GI. No acute worsening.

## 2022-12-03 NOTE — Assessment & Plan Note (Addendum)
Very severe COPD with high symptom burden and prone to exacerbations. Clinically improved with recent steroid course. Suspect his nasal symptoms are contributing to his DOE. He will continue triple therapy regimen with Breztri. Add on singulair for trigger prevention. Referral to pulmonary rehab. Action plan in place. Could consider biologic therapy with dupixent or daliresp in the future, depending on response to above recommendations.  Patient Instructions  Continue Breztri 2 puffs Twice daily. Brush tongue and rinse mouth afterwards Continue Albuterol inhaler 2 puffs or 3 mL neb every 6 hours as needed for shortness of breath or wheezing. Notify if symptoms persist despite rescue inhaler/neb use.  Continue flonase 2 sprays each nostril daily Continue xyzal 1 tab daily   Singulair (montelukast) 1 tab At bedtime for allergies   Attend pulmonary function testing as scheduled - 6/17 at 930 am  Referral to pulmonary rehab  Attend appointment with GI as scheduled   Given your symptoms, I am concerned that you may have sleep disordered breathing with sleep apnea. You will need a sleep study for further evaluation. Someone will contact you to schedule this.   We discussed how untreated sleep apnea puts an individual at risk for cardiac arrhthymias, pulm HTN, DM, stroke and increases their risk for daytime accidents. We also briefly reviewed treatment options including weight loss, side sleeping position, oral appliance, CPAP therapy or referral to ENT for possible surgical options  Use caution when driving and pull over if you become sleepy.  Follow up June 17th with Dr. Judeth Horn at 130 after your PFT at the [facility] office. If symptoms do not improve or worsen, please contact office for sooner follow up or seek emergency care.

## 2022-12-03 NOTE — Patient Instructions (Addendum)
Continue Breztri 2 puffs Twice daily. Brush tongue and rinse mouth afterwards Continue Albuterol inhaler 2 puffs or 3 mL neb every 6 hours as needed for shortness of breath or wheezing. Notify if symptoms persist despite rescue inhaler/neb use.  Continue flonase 2 sprays each nostril daily Continue xyzal 1 tab daily   Singulair (montelukast) 1 tab At bedtime for allergies   Attend pulmonary function testing as scheduled - 6/17 at 930 am  Referral to pulmonary rehab  Attend appointment with GI as scheduled   Given your symptoms, I am concerned that you may have sleep disordered breathing with sleep apnea. You will need a sleep study for further evaluation. Someone will contact you to schedule this.   We discussed how untreated sleep apnea puts an individual at risk for cardiac arrhthymias, pulm HTN, DM, stroke and increases their risk for daytime accidents. We also briefly reviewed treatment options including weight loss, side sleeping position, oral appliance, CPAP therapy or referral to ENT for possible surgical options  Use caution when driving and pull over if you become sleepy.  Follow up June 17th with Joshua Reed at 130 after your PFT at the [facility] office. If symptoms do not improve or worsen, please contact office for sooner follow up or seek emergency care.

## 2022-12-11 DIAGNOSIS — H353112 Nonexudative age-related macular degeneration, right eye, intermediate dry stage: Secondary | ICD-10-CM | POA: Diagnosis not present

## 2022-12-11 DIAGNOSIS — H353121 Nonexudative age-related macular degeneration, left eye, early dry stage: Secondary | ICD-10-CM | POA: Diagnosis not present

## 2022-12-11 DIAGNOSIS — H25813 Combined forms of age-related cataract, bilateral: Secondary | ICD-10-CM | POA: Diagnosis not present

## 2022-12-11 DIAGNOSIS — H524 Presbyopia: Secondary | ICD-10-CM | POA: Diagnosis not present

## 2022-12-11 DIAGNOSIS — H40013 Open angle with borderline findings, low risk, bilateral: Secondary | ICD-10-CM | POA: Diagnosis not present

## 2022-12-14 ENCOUNTER — Ambulatory Visit (HOSPITAL_BASED_OUTPATIENT_CLINIC_OR_DEPARTMENT_OTHER): Payer: No Typology Code available for payment source

## 2022-12-16 ENCOUNTER — Ambulatory Visit (HOSPITAL_BASED_OUTPATIENT_CLINIC_OR_DEPARTMENT_OTHER)
Admission: RE | Admit: 2022-12-16 | Discharge: 2022-12-16 | Disposition: A | Discharge status: Home / Self Care | Payer: No Typology Code available for payment source | Source: Ambulatory Visit | Attending: Gastroenterology | Admitting: Gastroenterology

## 2022-12-16 DIAGNOSIS — R140 Abdominal distension (gaseous): Secondary | ICD-10-CM | POA: Diagnosis not present

## 2022-12-16 DIAGNOSIS — R109 Unspecified abdominal pain: Secondary | ICD-10-CM | POA: Diagnosis not present

## 2022-12-16 DIAGNOSIS — I700 Atherosclerosis of aorta: Secondary | ICD-10-CM | POA: Diagnosis not present

## 2022-12-16 LAB — POCT I-STAT CREATININE: Creatinine, Ser: 1.1 mg/dL (ref 0.61–1.24)

## 2022-12-16 MED ORDER — IOHEXOL 300 MG/ML  SOLN
100.0000 mL | Freq: Once | INTRAMUSCULAR | Status: AC | PRN
  Administered 2022-12-16: 80 mL via INTRAVENOUS

## 2023-01-05 ENCOUNTER — Ambulatory Visit: Payer: No Typology Code available for payment source | Admitting: Internal Medicine

## 2023-01-05 ENCOUNTER — Encounter (HOSPITAL_BASED_OUTPATIENT_CLINIC_OR_DEPARTMENT_OTHER): Payer: No Typology Code available for payment source

## 2023-01-05 ENCOUNTER — Ambulatory Visit: Payer: No Typology Code available for payment source | Admitting: Nurse Practitioner

## 2023-01-05 ENCOUNTER — Ambulatory Visit (INDEPENDENT_AMBULATORY_CARE_PROVIDER_SITE_OTHER): Payer: No Typology Code available for payment source | Admitting: Nurse Practitioner

## 2023-01-05 ENCOUNTER — Encounter: Payer: Self-pay | Admitting: Nurse Practitioner

## 2023-01-05 VITALS — BP 120/64 | HR 71 | Temp 98.1°F | Ht 68.0 in | Wt 139.2 lb

## 2023-01-05 DIAGNOSIS — J441 Chronic obstructive pulmonary disease with (acute) exacerbation: Secondary | ICD-10-CM

## 2023-01-05 DIAGNOSIS — J449 Chronic obstructive pulmonary disease, unspecified: Secondary | ICD-10-CM | POA: Diagnosis not present

## 2023-01-05 DIAGNOSIS — J3089 Other allergic rhinitis: Secondary | ICD-10-CM | POA: Diagnosis not present

## 2023-01-05 LAB — PULMONARY FUNCTION TEST
DL/VA % pred: 53 %
DL/VA: 2.14 ml/min/mmHg/L
DLCO cor % pred: 38 %
DLCO cor: 9.14 ml/min/mmHg
DLCO unc % pred: 38 %
DLCO unc: 9.14 ml/min/mmHg
FEF 25-75 Post: 0.66 L/sec
FEF 25-75 Pre: 0.35 L/sec
FEF2575-%Change-Post: 86 %
FEF2575-%Pred-Post: 32 %
FEF2575-%Pred-Pre: 17 %
FEV1-%Change-Post: 38 %
FEV1-%Pred-Post: 37 %
FEV1-%Pred-Pre: 27 %
FEV1-Post: 1.06 L
FEV1-Pre: 0.76 L
FEV1FVC-%Change-Post: 10 %
FEV1FVC-%Pred-Pre: 51 %
FEV6-%Change-Post: 27 %
FEV6-%Pred-Post: 66 %
FEV6-%Pred-Pre: 52 %
FEV6-Post: 2.44 L
FEV6-Pre: 1.92 L
FEV6FVC-%Change-Post: 1 %
FEV6FVC-%Pred-Post: 101 %
FEV6FVC-%Pred-Pre: 99 %
FVC-%Change-Post: 25 %
FVC-%Pred-Post: 66 %
FVC-%Pred-Pre: 52 %
FVC-Post: 2.58 L
FVC-Pre: 2.05 L
Post FEV1/FVC ratio: 41 %
Post FEV6/FVC ratio: 95 %
Pre FEV1/FVC ratio: 37 %
Pre FEV6/FVC Ratio: 93 %
RV % pred: 228 %
RV: 5.57 L
TLC % pred: 117 %
TLC: 7.81 L

## 2023-01-05 NOTE — Progress Notes (Signed)
Full PFT performed today. °

## 2023-01-05 NOTE — Patient Instructions (Addendum)
Continue Breztri 2 puffs Twice daily. Brush tongue and rinse mouth afterwards Continue Albuterol inhaler 2 puffs or 3 mL neb every 6 hours as needed for shortness of breath or wheezing. Notify if symptoms persist despite rescue inhaler/neb use.  Continue flonase 2 sprays each nostril daily Continue xyzal 1 tab daily  Continue Singulair (montelukast) 1 tab At bedtime for allergies    Referral to pulmonary rehab  Overnight oxygen study ordered on room air    Follow up in 4 months with Dr. Judeth Horn. If symptoms do not improve or worsen, please contact office for sooner follow up or seek emergency care.

## 2023-01-05 NOTE — Progress Notes (Unsigned)
@Patient  ID: Joshua Reed, male    DOB: 12-22-1945, 77 y.o.   MRN: [id number]  Chief Complaint  Patient presents with   Follow-up    Review PFT from today.  Breathing improved.  Still SOB with strenuous exercise. Overall sx for about 18 months.    Referring provider: Daisy Floro, MD  HPI: 77 year old male, former smoker (60 pack years) followed for COPD with chronic bronchitis and emphysema. He is followed by the lung cancer screening program with last LDCT chest on 06/11/2022. Past medical history significant for seizure disorder, ETOH abuse, macular degeneration, severe protein calorie malnutrition, tobacco abuse, HLD.   TEST/EVENTS:  10/20/2016 Spirometry: FVC 1.8 (45), FEV1 0.9 (29), ratio 47. Very severe obstructive airway disease.  03/08/2021 LDCT chest: atherosclerosis. Centrilobular emphysema. Biapical pleuroparenchymal scarring. Calcified RLL granuloma. No nodules present - Lung-RADS1  06/10/2022 LDCT chest: Lung RADS 2. CAD. 10/08/2022 CT chest wo con: stable 4 mm RUL nodule, larger on CT August 2023. Likely benign. Emphysema. No acute process. Atheroslcerosis.   08/27/2021: OV with Dr. Celine Mans. Evidence of emphysema on CT chest. Experiences DOE; able to complete daily activities but difficulties lifting things. Using albuterol Twice daily. Not on any maintenance therapies - wanted to hold off. PFTs ordered. Recently quit smoking 07/2021.   09/19/2021: Ov with Bryanne Riquelme NP for worsening in his breathing. He contacted the office around a week ago and decided he wanted to be started on a daily maintenance inhaler so Trelegy was sent to his pharmacy. He started this yesterday; however, last night he had a scary episode with terrible shortness of breath. He used his daughter's nebulizer machine and felt some relief afterwards. Today, he reports that his breathing is slightly better but he still has more shortness of breath than he normally does. He hasn't noticed a lot of wheezing but has  some noisy breathing every now and then. He has been coughing frequently. He reports that he occasionally gets up some clear sputum but other times it's nonproductive and congested. Using albuterol a few times a day. Allergy symptoms have also flared over the past few weeks with nasal drainage and a tickle in his throat. He's tried multiple OTC allergy medicines in the past without much relief. Treated for AECOPD with z pack and prednisone. [employer] allergy symptoms.  07/25/2022: OV with Kelcey Korus NP for overdue follow up. He has been doing okay since he was here last. Breathing has been stable since he was treated for exacerbation in March. No antibiotics or steroids. No hospitalizations. He's able to complete daily activities without significant burden. Finds that he gets most winded when he's carrying something or trying to do work around the house. His cough is at baseline; minimally productive with white sputum. No increased congestion. He is on Trelegy. Uses albuterol a few times a week. He wants to get a new nebulizer machine; lost his old one. He had low dose lung cancer CT with lung RADS 2. Plan for repeat in November 2024.   08/25/2022: OV with Dr. Judeth Horn for acute visit. Dr. Celine Mans out of office. Treated for AECOPD with doxycycline and prednisone.   09/25/2022: OV with Dr. Judeth Horn for follow up. Treated for AECOPD at last visit. Using Trelegy daily. Developed mouth sore on left side of tongue. Appears liek an aphthous ulcer to me. No sign of thrush. Stopped inhaler for about 10 days. No worsening breathing. Prescribed nystatin.   10/23/2022: OV with Dr. Marchelle Gearing. CT chest from March 2024 with  emphysema; no ILD and only a 4 mm lung nodule. Symptoms worse over the past 10 days, similar to when he needed prednisone. Spring allergies are also very bothersome. Coughing, wheezing, nocturnal symptoms. Using albuterol quite a bit. Does not want abx. Interested in prednisone. Blood work - CBC with diff, RAST panel,  BNP, troponin, A1AT. Prednisone taper. Echo and full PFT ordered. Change Trelegy to [employer]. ONO on room air.  12/03/2022: OV with Aamya Orellana NP for follow up. He has been doing better since his last visit and completing steroids. Started Breztri and hasn't had any more sores in his mouth. He doesn't know if he notices a huge change compared to the Trelegy, as far as his breathing goes. He does have trouble with longer distances and uphill climbing. He usually has to stop when he is mowing the grass to catch his breath. He has a chronic daily cough. Doesn't notice much wheezing now. He does have daily allergy symptoms with nasal congestion, drainage, and throat clearing. He denies any hemoptysis, wheezing, chills, fevers, night sweats, leg swelling, orthopnea. Uses albuterol 1-2 times a day, which is baseline for him. Takes flonase and xyzal for his allergies. His wife wants him to start being more active.  She's also worried about his sleep at night. She says that he has weird dreams that frequently wake him up. He's tired most days and dozes off when he's not doing much. She says she's questioned if he's been breathing at night. He has dry mouth at night. He denies any snoring, drowsy driving, sleep talking/walking, sleep paralysis. Never had a previous sleep study. Wife is worried he has sleep apnea.   01/05/2023: Today - follow up Patient presents today for follow up with his wife. He had pulmonary function testing earlier today, which was overall stable compared to his spirometry testing from 2018. He has very severe obstructive lung disease with reversibility and a severe diffusing defect. He tells me his breathing is feeling about the same compared to his last visit. Hasn't had any more trouble with wheezing or cough since his visit in April. He has been using his Breztri. Sometimes he thinks Trelegy worked better but it gave him mouth sores. He has never tried his inhaler with a spacer. His allergy symptoms  had gotten better with the singulair. He stopped taking this a few weeks ago because he wasn't sure if he needed to continue it or not. He denies any fevers, chills, hemoptysis, leg swelling, orthopnea. He feels like his sleep has been better since he was here last. He does still have some leg pains in the morning but he feels like he's sleeping better and not as tired during the day. He never did the HST because of this. He denies snoring, drowsy driving, morning headaches. He still wants to go to pulmonary rehab - never heard back after his last visit.   Allergies  Allergen Reactions   Acyclovir And Related Hives    Immunization History  Administered Date(s) Administered   Influenza, High Dose Seasonal PF 07/12/2014, 05/15/2017, 07/29/2018, 05/16/2019   PFIZER(Purple Top)SARS-COV-2 Vaccination 09/25/2019, 10/19/2019   Pneumococcal Conjugate-13 01/10/2015   Pneumococcal Polysaccharide-23 04/14/2013   Tdap 05/24/2014   Zoster, Live 04/14/2013    Past Medical History:  Diagnosis Date   Alcohol abuse    COPD (chronic obstructive pulmonary disease) (HCC)    Hyperlipidemia    Low vitamin B12 level    Macular degeneration    Seizure disorder (HCC) 2205   ALCOHOL  RELATED   Seizures (HCC)    Vitamin D deficiency     Tobacco History: Social History   Tobacco Use  Smoking Status Former   Packs/day: .5   Types: Cigarettes   Quit date: 08/06/2021   Years since quitting: 1.4  Smokeless Tobacco Never   Counseling given: Not Answered   Outpatient Medications Prior to Visit  Medication Sig Dispense Refill   albuterol (PROVENTIL) (2.5 MG/3ML) 0.083% nebulizer solution Take 3 mLs (2.5 mg total) by nebulization every 6 (six) hours as needed for wheezing or shortness of breath. 90 mL 3   albuterol (VENTOLIN HFA) 108 (90 Base) MCG/ACT inhaler Inhale into the lungs every 6 (six) hours as needed.     aspirin EC 81 MG tablet Take 1 tablet (81 mg total) by mouth daily. Swallow whole. (Patient  not taking: Reported on 12/03/2022) 90 tablet 3   BREZTRI AEROSPHERE 160-9-4.8 MCG/ACT AERO 2 puffs Inhalation Twice a day for 90 days     Cholecalciferol 50 MCG (2000 UT) CAPS 2 tablets Orally once a day     Coenzyme Q10 (CO Q 10 PO) Take 1 tablet by mouth daily.     Cyanocobalamin (B-12 IJ) Takes injection as directed bi weekly     doxycycline (VIBRA-TABS) 100 MG tablet Take 1 tablet (100 mg total) by mouth 2 (two) times daily. 14 tablet 0   ezetimibe (ZETIA) 10 MG tablet Take 1 tablet (10 mg total) by mouth daily. 30 tablet 6   fluticasone (FLONASE) 50 MCG/ACT nasal spray Place 2 sprays into both nostrils daily. 18.2 mL 2   levETIRAcetam (KEPPRA) 1000 MG tablet Take 1 tablet (1,000 mg total) by mouth 2 (two) times daily. 180 tablet 3   levocetirizine (XYZAL) 5 MG tablet Take 1 tablet (5 mg total) by mouth every evening. 90 tablet 1   montelukast (SINGULAIR) 10 MG tablet Take 1 tablet (10 mg total) by mouth at bedtime. 30 tablet 5   Multiple Vitamin (MULTIVITAMIN WITH MINERALS) TABS tablet Take 1 tablet by mouth daily.     Omega-3 Fatty Acids (FISH OIL PO) Take 1 capsule by mouth daily.     predniSONE (DELTASONE) 10 MG tablet Take 4 tabs for 2 days, then 3 tabs for 2 days, 2 tabs for 2 days, then 1 tab for 2 days, then stop. (Patient not taking: Reported on 12/03/2022) 20 tablet 0   Vitamin D, Ergocalciferol, (DRISDOL) 50000 UNITS CAPS capsule Take 50,000 Units by mouth once a week.  0   Fluticasone-Umeclidin-Vilant (TRELEGY ELLIPTA) 100-62.5-25 MCG/ACT AEPB Inhale 1 puff into the lungs daily. 1 each 3   No facility-administered medications prior to visit.     Review of Systems:   Constitutional: No weight loss or gain, night sweats, fevers, chills. +fatigue HEENT: No headaches, difficulty swallowing, tooth/dental problems, or sore throat. No sneezing, itching, ear ache, postnasal drip. +occasional nasal congestion. +dry mouth CV:  No chest pain, orthopnea, PND, swelling in lower  extremities, anasarca, dizziness, palpitations, syncope Resp: +shortness of breath with exertion; chronic cough. No excess mucus or change in color of mucus. No hemoptysis. No wheezing.  No chest wall deformity GI:  No heartburn, indigestion, abdominal pain, nausea, vomiting, loss of appetite GU: No dysuria, change in color of urine, urgency or frequency.   Skin: No rash, lesions, ulcerations MSK:  No joint pain or swelling.   Neuro: No dizziness or lightheadedness.  Psych: No depression or anxiety. Mood stable. +sleep disturbance, weird dreams    Physical Exam:  There were no vitals taken for this visit.  GEN: Pleasant, interactive, well-appearing; in no acute distress. HEENT:  Normocephalic and atraumatic. PERRLA. Sclera white. Nasal turbinates pink, moist and patent bilaterally. No rhinorrhea present. Oropharynx pink and moist, without exudate or edema. No lesions, ulcerations. Mallampati II NECK:  Supple w/ fair ROM. No JVD present. Normal carotid impulses w/o bruits. Thyroid symmetrical with no goiter or nodules palpated. No lymphadenopathy.   CV: RRR, no m/r/g, no peripheral edema. Pulses intact, +2 bilaterally. No cyanosis, pallor or clubbing. PULMONARY:  Unlabored, regular breathing. Diminished bases bilaterally A&P w/o wheezes/rales/rhonchi. No accessory muscle use. No dullness to percussion. GI: BS present and normoactive. Soft, non-tender to palpation. No organomegaly or masses detected.  MSK: No erythema, warmth or tenderness. Cap refil <2 sec all extrem. No deformities or joint swelling noted.  Neuro: A/Ox3. No focal deficits noted.   Skin: Warm, no lesions or rashe Psych: Normal affect and behavior. Judgement and thought content appropriate.     Lab Results:  CBC    Component Value Date/Time   WBC 7.0 10/23/2022 1036   RBC 4.62 10/23/2022 1036   HGB 14.2 10/23/2022 1036   HCT 42.7 10/23/2022 1036   PLT 261.0 10/23/2022 1036   MCV 92.4 10/23/2022 1036   MCH 30.2  07/03/2014 0511   MCHC 33.2 10/23/2022 1036   RDW 13.8 10/23/2022 1036   LYMPHSABS 1.9 10/23/2022 1036   MONOABS 0.7 10/23/2022 1036   EOSABS 0.7 10/23/2022 1036   BASOSABS 0.0 10/23/2022 1036    BMET    Component Value Date/Time   NA 134 (L) 07/03/2014 0511   K 4.2 07/03/2014 0511   CL 100 07/03/2014 0511   CO2 25 07/03/2014 0511   GLUCOSE 80 07/03/2014 0511   BUN 7 07/03/2014 0511   CREATININE 1.10 12/16/2022 1914   CALCIUM 7.8 (L) 07/03/2014 0511   GFRNONAA >90 07/03/2014 0511   GFRAA >90 07/03/2014 0511    BNP No results found for: "BNP"   Imaging:  CT ABDOMEN PELVIS W CONTRAST  Result Date: 12/21/2022 CLINICAL DATA:  Abdominal pain.  History of ethanol abuse. EXAM: CT ABDOMEN AND PELVIS WITH CONTRAST TECHNIQUE: Multidetector CT imaging of the abdomen and pelvis was performed using the standard protocol following bolus administration of intravenous contrast. RADIATION DOSE REDUCTION: This exam was performed according to the departmental dose-optimization program which includes automated exposure control, adjustment of the mA and/or kV according to patient size and/or use of iterative reconstruction technique. CONTRAST:  80mL OMNIPAQUE IOHEXOL 300 MG/ML  SOLN COMPARISON:  CT scan of the chest 10/08/2022; most recent prior CT scan of the abdomen and pelvis 06/13/2014 FINDINGS: Lower chest: No acute abnormality. Hepatobiliary: No focal liver abnormality is seen. No gallstones, gallbladder wall thickening, or biliary dilatation. Pancreas: Unremarkable. No pancreatic ductal dilatation or surrounding inflammatory changes. Spleen: No splenic injury or perisplenic hematoma. Adrenals/Urinary Tract: Adrenal glands are unremarkable. Kidneys are normal, without renal calculi, focal lesion, or hydronephrosis. Bladder is unremarkable. Stomach/Bowel: No focal bowel wall thickening or evidence of obstruction. Unremarkable stomach and proximal small bowel. Vascular/Lymphatic: Tortuous aorta with  multifocal atherosclerotic vascular calcifications. No aneurysm or dissection. Reproductive: Prostate is unremarkable. Other: No abdominal wall hernia or abnormality. No abdominopelvic ascites. Musculoskeletal: No acute fracture or aggressive appearing lytic or blastic osseous lesion. Surgical changes of prior ORIF of a now healed right proximal femoral fracture with intramedullary nail and trans femoral neck lag screw. Dextroconvex scoliosis with mild multilevel degenerative disc disease. IMPRESSION: 1. No acute  abnormality within the abdomen or pelvis. 2. Tortuous and atherosclerotic abdominal aorta. No aneurysm or dissection. 3. Mild dextroconvex scoliosis with multilevel mild degenerative disc disease. Aortic Atherosclerosis (ICD10-I70.0). Electronically Signed   By: Malachy Moan M.D.   On: 12/21/2022 08:41          Latest Ref Rng & Units 01/05/2023    8:01 AM  PFT Results  FVC-Pre L 2.05  P  FVC-Predicted Pre % 52  P  FVC-Post L 2.58  P  FVC-Predicted Post % 66  P  Pre FEV1/FVC % % 37  P  Post FEV1/FCV % % 41  P  FEV1-Pre L 0.76  P  FEV1-Predicted Pre % 27  P  FEV1-Post L 1.06  P  DLCO uncorrected ml/min/mmHg 9.14  P  DLCO UNC% % 38  P  DLCO corrected ml/min/mmHg 9.14  P  DLCO COR %Predicted % 38  P  DLVA Predicted % 53  P  TLC L 7.81  P  TLC % Predicted % 117  P  RV % Predicted % 228  P    P Preliminary result    No results found for: "NITRICOXIDE"      Assessment & Plan:   No problem-specific Assessment & Plan notes found for this encounter.    I spent 45 minutes of dedicated to the care of this patient on the date of this encounter to include pre-visit review of records, face-to-face time with the patient discussing conditions above, post visit ordering of testing, clinical documentation with the electronic health record, making appropriate referrals as documented, and communicating necessary findings to members of the patients care team.  Noemi Chapel,  NP 01/05/2023  Pt aware and understands NP's role. Marland Kitchen

## 2023-01-05 NOTE — Patient Instructions (Signed)
Full PFT performed today. °

## 2023-01-08 ENCOUNTER — Encounter: Payer: Self-pay | Admitting: Nurse Practitioner

## 2023-01-08 NOTE — Assessment & Plan Note (Signed)
Very severe COPD with high symptom burden and prone to exacerbations. Compensated on current regimen. He will continue triple therapy regimen with Breztri. Add on singulair for trigger prevention. Referral to pulmonary rehab placed again as I believe he would benefit from this. Previous walk test without desaturations. Will obtain ONO on room air to rule out nocturnal desaturations. His fatigue and sleep have improved so he would like to hold off on HST to r/u OSA. Action plan in place.   Patient Instructions  Continue Breztri 2 puffs Twice daily. Brush tongue and rinse mouth afterwards Continue Albuterol inhaler 2 puffs or 3 mL neb every 6 hours as needed for shortness of breath or wheezing. Notify if symptoms persist despite rescue inhaler/neb use.  Continue flonase 2 sprays each nostril daily Continue xyzal 1 tab daily  Continue Singulair (montelukast) 1 tab At bedtime for allergies    Referral to pulmonary rehab  Overnight oxygen study ordered on room air    Follow up in 4 months with Dr. Judeth Horn. If symptoms do not improve or worsen, please contact office for sooner follow up or seek emergency care.

## 2023-01-08 NOTE — Assessment & Plan Note (Signed)
Compensated on current regimen °

## 2023-01-15 ENCOUNTER — Other Ambulatory Visit: Payer: Self-pay

## 2023-01-15 DIAGNOSIS — J449 Chronic obstructive pulmonary disease, unspecified: Secondary | ICD-10-CM

## 2023-01-20 DIAGNOSIS — M65312 Trigger thumb, left thumb: Secondary | ICD-10-CM | POA: Diagnosis not present

## 2023-01-21 DIAGNOSIS — D123 Benign neoplasm of transverse colon: Secondary | ICD-10-CM | POA: Diagnosis not present

## 2023-01-21 DIAGNOSIS — K3189 Other diseases of stomach and duodenum: Secondary | ICD-10-CM | POA: Diagnosis not present

## 2023-01-21 DIAGNOSIS — D125 Benign neoplasm of sigmoid colon: Secondary | ICD-10-CM | POA: Diagnosis not present

## 2023-01-21 DIAGNOSIS — K900 Celiac disease: Secondary | ICD-10-CM | POA: Diagnosis not present

## 2023-01-21 DIAGNOSIS — D120 Benign neoplasm of cecum: Secondary | ICD-10-CM | POA: Diagnosis not present

## 2023-01-21 DIAGNOSIS — Z1211 Encounter for screening for malignant neoplasm of colon: Secondary | ICD-10-CM | POA: Diagnosis not present

## 2023-01-21 DIAGNOSIS — K219 Gastro-esophageal reflux disease without esophagitis: Secondary | ICD-10-CM | POA: Diagnosis not present

## 2023-01-21 DIAGNOSIS — K2930 Chronic superficial gastritis without bleeding: Secondary | ICD-10-CM | POA: Diagnosis not present

## 2023-01-21 DIAGNOSIS — K2289 Other specified disease of esophagus: Secondary | ICD-10-CM | POA: Diagnosis not present

## 2023-01-21 DIAGNOSIS — K648 Other hemorrhoids: Secondary | ICD-10-CM | POA: Diagnosis not present

## 2023-01-26 ENCOUNTER — Encounter (HOSPITAL_COMMUNITY): Payer: Self-pay

## 2023-01-27 DIAGNOSIS — K900 Celiac disease: Secondary | ICD-10-CM | POA: Diagnosis not present

## 2023-01-27 DIAGNOSIS — K219 Gastro-esophageal reflux disease without esophagitis: Secondary | ICD-10-CM | POA: Diagnosis not present

## 2023-01-27 DIAGNOSIS — K2930 Chronic superficial gastritis without bleeding: Secondary | ICD-10-CM | POA: Diagnosis not present

## 2023-01-27 DIAGNOSIS — D125 Benign neoplasm of sigmoid colon: Secondary | ICD-10-CM | POA: Diagnosis not present

## 2023-03-06 ENCOUNTER — Ambulatory Visit (HOSPITAL_COMMUNITY): Payer: No Typology Code available for payment source

## 2023-03-13 ENCOUNTER — Telehealth: Payer: Self-pay | Admitting: Internal Medicine

## 2023-03-13 DIAGNOSIS — J4489 Other specified chronic obstructive pulmonary disease: Secondary | ICD-10-CM

## 2023-03-13 NOTE — Telephone Encounter (Signed)
Dr. Celine Mans when your available can you please sign orders for Pulmonary rehab

## 2023-03-13 NOTE — Telephone Encounter (Signed)
Dr.Desai needs to sign off on the referral for [facility] and not the PA.

## 2023-03-17 ENCOUNTER — Telehealth (HOSPITAL_COMMUNITY): Payer: Self-pay

## 2023-03-17 NOTE — Telephone Encounter (Signed)
Called pt in regards to scheduling Pulmonary orientation. LM on VM.

## 2023-03-17 NOTE — Telephone Encounter (Signed)
Pt insurance is active and benefits verified through [facility] Co-pay 15, DED 0/0 met, out of pocket 3600/498 met, co-insurance 0. no pre-authorization required. Joshua Reed 03/17/2023@2 :15, REF# [id number]   Will contact patient to see if he is interested in the Pulmonary Rehab Program. If interested, patient will need to complete follow up appt. Once completed, patient will be contacted for scheduling upon review by the RN Navigator.

## 2023-03-17 NOTE — Telephone Encounter (Signed)
Attempted to call patient in regards to Pulmonary Rehab - LM on VM °

## 2023-03-20 ENCOUNTER — Telehealth (HOSPITAL_COMMUNITY): Payer: Self-pay

## 2023-03-20 NOTE — Telephone Encounter (Signed)
Pt returned PR phone call and stated he is interested in PR. Pt will come in for orientation on 04/06/23 at 1PM and will attend the 1:15PM class.   Mailed letter

## 2023-04-06 ENCOUNTER — Encounter (HOSPITAL_COMMUNITY): Payer: Self-pay

## 2023-04-06 ENCOUNTER — Encounter (HOSPITAL_COMMUNITY)
Admission: RE | Admit: 2023-04-06 | Discharge: 2023-04-06 | Disposition: A | Discharge status: Home / Self Care | Payer: No Typology Code available for payment source | Source: Ambulatory Visit | Attending: Internal Medicine | Admitting: Internal Medicine

## 2023-04-06 VITALS — BP 126/66 | HR 61 | Ht 68.0 in | Wt 138.7 lb

## 2023-04-06 DIAGNOSIS — Z5189 Encounter for other specified aftercare: Secondary | ICD-10-CM | POA: Insufficient documentation

## 2023-04-06 DIAGNOSIS — J449 Chronic obstructive pulmonary disease, unspecified: Secondary | ICD-10-CM | POA: Diagnosis not present

## 2023-04-06 DIAGNOSIS — Z87891 Personal history of nicotine dependence: Secondary | ICD-10-CM | POA: Diagnosis not present

## 2023-04-06 NOTE — Progress Notes (Signed)
Pulmonary Individual Treatment Plan  Patient Details  Name: Joshua Reed MRN: [id number] Date of Birth: 27-Jun-1946 Referring Provider:   Doristine Devoid Pulmonary Rehab Walk Test from 04/06/2023 in [facility]  Referring Provider Ramaswamy       Initial Encounter Date:  Flowsheet Row Pulmonary Rehab Walk Test from 04/06/2023 in [facility]  Date 04/06/23       Visit Diagnosis: Stage 3 severe COPD by GOLD classification (HCC)  Patient's Home Medications on Admission:   Current Outpatient Medications:    albuterol (PROVENTIL) (2.5 MG/3ML) 0.083% nebulizer solution, Take 3 mLs (2.5 mg total) by nebulization every 6 (six) hours as needed for wheezing or shortness of breath., Disp: 90 mL, Rfl: 3   albuterol (VENTOLIN HFA) 108 (90 Base) MCG/ACT inhaler, Inhale into the lungs every 6 (six) hours as needed., Disp: , Rfl:    aspirin EC 81 MG tablet, Take 1 tablet (81 mg total) by mouth daily. Swallow whole., Disp: 90 tablet, Rfl: 3   BREZTRI AEROSPHERE 160-9-4.8 MCG/ACT AERO, 2 puffs Inhalation Twice a day for 90 days, Disp: , Rfl:    Cholecalciferol 50 MCG (2000 UT) CAPS, 2 tablets Orally once a day, Disp: , Rfl:    Coenzyme Q10 (CO Q 10 PO), Take 200 mg by mouth daily., Disp: , Rfl:    Cyanocobalamin (B-12 IJ), Takes injection as directed bi weekly, Disp: , Rfl:    levETIRAcetam (KEPPRA) 1000 MG tablet, Take 1 tablet (1,000 mg total) by mouth 2 (two) times daily., Disp: 180 tablet, Rfl: 3   MAGNESIUM GLYCINATE PO, Take by mouth., Disp: , Rfl:    Omega-3 Fatty Acids (FISH OIL PO), Take 1 capsule by mouth daily., Disp: , Rfl:    Vitamin D, Ergocalciferol, (DRISDOL) 50000 UNITS CAPS capsule, Take 50,000 Units by mouth once a week. Vitamin D3 with K2.  One tablet daily, Disp: , Rfl: 0   doxycycline (VIBRA-TABS) 100 MG tablet, Take 1 tablet (100 mg total) by mouth 2 (two) times  daily. (Patient not taking: Reported on 01/05/2023), Disp: 14 tablet, Rfl: 0   ezetimibe (ZETIA) 10 MG tablet, Take 1 tablet (10 mg total) by mouth daily. (Patient not taking: Reported on 01/05/2023), Disp: 30 tablet, Rfl: 6   fluticasone (FLONASE) 50 MCG/ACT nasal spray, Place 2 sprays into both nostrils daily. (Patient not taking: Reported on 01/05/2023), Disp: 18.2 mL, Rfl: 2   levocetirizine (XYZAL) 5 MG tablet, Take 1 tablet (5 mg total) by mouth every evening. (Patient not taking: Reported on 01/05/2023), Disp: 90 tablet, Rfl: 1   montelukast (SINGULAIR) 10 MG tablet, Take 1 tablet (10 mg total) by mouth at bedtime. (Patient not taking: Reported on 01/05/2023), Disp: 30 tablet, Rfl: 5   Multiple Vitamin (MULTIVITAMIN WITH MINERALS) TABS tablet, Take 1 tablet by mouth daily. (Patient not taking: Reported on 01/05/2023), Disp: , Rfl:    predniSONE (DELTASONE) 10 MG tablet, Take 4 tabs for 2 days, then 3 tabs for 2 days, 2 tabs for 2 days, then 1 tab for 2 days, then stop. (Patient not taking: Reported on 12/03/2022), Disp: 20 tablet, Rfl: 0  Past Medical History: Past Medical History:  Diagnosis Date   Alcohol abuse    COPD (chronic obstructive pulmonary disease) (HCC)    Hyperlipidemia    Low vitamin B12 level    Macular degeneration    Seizure disorder (HCC) 2205   ALCOHOL  RELATED   Seizures (HCC)    Vitamin D deficiency     Tobacco Use: Social History   Tobacco Use  Smoking Status Former   Current packs/day: 0.00   Types: Cigarettes   Quit date: 08/06/2021   Years since quitting: 1.6  Smokeless Tobacco Never    Labs: Review Flowsheet       Latest Ref Rng & Units 05/24/2014  Labs for ITP Cardiac and Pulmonary Rehab  TCO2 0 - 100 mmol/L 22     Details            Capillary Blood Glucose: Lab Results  Component Value Date   GLUCAP 162 (H) 05/26/2014     Pulmonary Assessment Scores:  Pulmonary Assessment Scores     Row Name 04/06/23 1338         ADL UCSD   ADL  Phase Entry     SOB Score total 19       CAT Score   CAT Score 5       mMRC Score   mMRC Score 1             UCSD: Self-administered rating of dyspnea associated with activities of daily living (ADLs) 6-point scale (0 = "not at all" to 5 = "maximal or unable to do because of breathlessness")  Scoring Scores range from 0 to 120.  Minimally important difference is 5 units  CAT: CAT can identify the health impairment of COPD patients and is better correlated with disease progression.  CAT has a scoring range of zero to 40. The CAT score is classified into four groups of low (less than 10), medium (10 - 20), high (21-30) and very high (31-40) based on the impact level of disease on health status. A CAT score over 10 suggests significant symptoms.  A worsening CAT score could be explained by an exacerbation, poor medication adherence, poor inhaler technique, or progression of COPD or comorbid conditions.  CAT MCID is 2 points  mMRC: mMRC (Modified Medical Research Council) Dyspnea Scale is used to assess the degree of baseline functional disability in patients of respiratory disease due to dyspnea. No minimal important difference is established. A decrease in score of 1 point or greater is considered a positive change.   Pulmonary Function Assessment:  Pulmonary Function Assessment - 04/06/23 1334       Breath   Bilateral Breath Sounds Decreased    Shortness of Breath Yes;Limiting activity             Exercise Target Goals: Exercise Program Goal: Individual exercise prescription set using results from initial 6 min walk test and THRR while considering  patient's activity barriers and safety.   Exercise Prescription Goal: Initial exercise prescription builds to 30-45 minutes a day of aerobic activity, 2-3 days per week.  Home exercise guidelines will be given to patient during program as part of exercise prescription that the participant will acknowledge.  Activity Barriers  & Risk Stratification:  Activity Barriers & Cardiac Risk Stratification - 04/06/23 1332       Activity Barriers & Cardiac Risk Stratification   Activity Barriers Deconditioning;Muscular Weakness;Shortness of Breath             6 Minute Walk:  6 Minute Walk     Row Name 04/06/23 1401         6 Minute Walk   Phase Initial     Distance 1260 feet     Walk Time 6 minutes     #  of Rest Breaks 0     MPH 2.39     METS 2.88     RPE 9     Perceived Dyspnea  1     VO2 Peak 10.08     Resting HR 61 bpm     Resting BP 126/66     Resting Oxygen Saturation  95 %     Exercise Oxygen Saturation  during 6 min walk 92 %     Max Ex. HR 86 bpm     Max Ex. BP 140/70     2 Minute Post BP 120/66       Interval HR   1 Minute HR 80     2 Minute HR 86     3 Minute HR 84     4 Minute HR 86     5 Minute HR 84     6 Minute HR 85     2 Minute Post HR 65     Interval Heart Rate? Yes       Interval Oxygen   Interval Oxygen? Yes     Baseline Oxygen Saturation % 95 %     1 Minute Oxygen Saturation % 95 %     1 Minute Liters of Oxygen 0 L     2 Minute Oxygen Saturation % 94 %     2 Minute Liters of Oxygen 0 L     3 Minute Oxygen Saturation % 92 %     3 Minute Liters of Oxygen 0 L     4 Minute Oxygen Saturation % 93 %     4 Minute Liters of Oxygen 0 L     5 Minute Oxygen Saturation % 93 %     5 Minute Liters of Oxygen 0 L     6 Minute Oxygen Saturation % 94 %     6 Minute Liters of Oxygen 0 L     2 Minute Post Oxygen Saturation % 97 %     2 Minute Post Liters of Oxygen 0 L              Oxygen Initial Assessment:  Oxygen Initial Assessment - 04/06/23 1333       Home Oxygen   Home Oxygen Device None    Sleep Oxygen Prescription None    Home Exercise Oxygen Prescription None    Home Resting Oxygen Prescription None      Initial 6 min Walk   Oxygen Used None      Program Oxygen Prescription   Program Oxygen Prescription None      Intervention   Short Term Goals To learn  and understand importance of maintaining oxygen saturations>88%;To learn and demonstrate proper use of respiratory medications;To learn and understand importance of monitoring SPO2 with pulse oximeter and demonstrate accurate use of the pulse oximeter.;To learn and demonstrate proper pursed lip breathing techniques or other breathing techniques.     Long  Term Goals Maintenance of O2 saturations>88%;Compliance with respiratory medication;Verbalizes importance of monitoring SPO2 with pulse oximeter and return demonstration;Exhibits proper breathing techniques, such as pursed lip breathing or other method taught during program session;Demonstrates proper use of MDI's             Oxygen Re-Evaluation:   Oxygen Discharge (Final Oxygen Re-Evaluation):   Initial Exercise Prescription:  Initial Exercise Prescription - 04/06/23 1400       Date of Initial Exercise RX and Referring Provider   Date 04/06/23    Referring Provider Marchelle Gearing  Expected Discharge Date 07/02/23      Treadmill   MPH 1.5    Grade 0.5    Minutes 15    METs 2.26      Bike   Level 2    Minutes 15    METs 2      Prescription Details   Frequency (times per week) 2    Duration Progress to 30 minutes of continuous aerobic without signs/symptoms of physical distress      Intensity   THRR 40-80% of Max Heartrate 58-116    Ratings of Perceived Exertion 11-13    Perceived Dyspnea 0-4      Progression   Progression Continue to progress workloads to maintain intensity without signs/symptoms of physical distress.      Resistance Training   Training Prescription Yes    Weight Blue Bands    Reps 10-15             Perform Capillary Blood Glucose checks as needed.  Exercise Prescription Changes:   Exercise Comments:   Exercise Goals and Review:   Exercise Goals     Row Name 04/06/23 1333             Exercise Goals   Increase Physical Activity Yes       Intervention Provide advice,  education, support and counseling about physical activity/exercise needs.;Develop an individualized exercise prescription for aerobic and resistive training based on initial evaluation findings, risk stratification, comorbidities and participant's personal goals.       Expected Outcomes Short Term: Attend rehab on a regular basis to increase amount of physical activity.;Long Term: Exercising regularly at least 3-5 days a week.;Long Term: Add in home exercise to make exercise part of routine and to increase amount of physical activity.       Increase Strength and Stamina Yes       Intervention Provide advice, education, support and counseling about physical activity/exercise needs.;Develop an individualized exercise prescription for aerobic and resistive training based on initial evaluation findings, risk stratification, comorbidities and participant's personal goals.       Expected Outcomes Short Term: Increase workloads from initial exercise prescription for resistance, speed, and METs.;Short Term: Perform resistance training exercises routinely during rehab and add in resistance training at home;Long Term: Improve cardiorespiratory fitness, muscular endurance and strength as measured by increased METs and functional capacity ( )       Able to understand and use rate of perceived exertion (RPE) scale Yes       Intervention Provide education and explanation on how to use RPE scale       Expected Outcomes Short Term: Able to use RPE daily in rehab to express subjective intensity level;Long Term:  Able to use RPE to guide intensity level when exercising independently       Able to understand and use Dyspnea scale Yes       Intervention Provide education and explanation on how to use Dyspnea scale       Expected Outcomes Short Term: Able to use Dyspnea scale daily in rehab to express subjective sense of shortness of breath during exertion;Long Term: Able to use Dyspnea scale to guide intensity level when  exercising independently       Knowledge and understanding of Target Heart Rate Range (THRR) Yes       Intervention Provide education and explanation of THRR including how the numbers were predicted and where they are located for reference       Expected Outcomes Short  Term: Able to state/look up THRR;Short Term: Able to use daily as guideline for intensity in rehab;Long Term: Able to use THRR to govern intensity when exercising independently       Understanding of Exercise Prescription Yes       Intervention Provide education, explanation, and written materials on patient's individual exercise prescription       Expected Outcomes Short Term: Able to explain program exercise prescription;Long Term: Able to explain home exercise prescription to exercise independently                Exercise Goals Re-Evaluation :   Discharge Exercise Prescription (Final Exercise Prescription Changes):   Nutrition:  Target Goals: Understanding of nutrition guidelines, daily intake of sodium 1500mg , cholesterol 200mg , calories 30% from fat and 7% or less from saturated fats, daily to have 5 or more servings of fruits and vegetables.  Biometrics:  Pre Biometrics - 04/06/23 1320       Pre Biometrics   Grip Strength 35 kg              Nutrition Therapy Plan and Nutrition Goals:   Nutrition Assessments:  MEDIFICTS Score Key: >=70 Need to make dietary changes  40-70 Heart Healthy Diet <= 40 Therapeutic Level Cholesterol Diet   Picture Your Plate Scores: <78 Unhealthy dietary pattern with much room for improvement. 41-50 Dietary pattern unlikely to meet recommendations for good health and room for improvement. 51-60 More healthful dietary pattern, with some room for improvement.  >60 Healthy dietary pattern, although there may be some specific behaviors that could be improved.    Nutrition Goals Re-Evaluation:   Nutrition Goals Discharge (Final Nutrition Goals  Re-Evaluation):   Psychosocial: Target Goals: Acknowledge presence or absence of significant depression and/or stress, maximize coping skills, provide positive support system. Participant is able to verbalize types and ability to use techniques and skills needed for reducing stress and depression.  Initial Review & Psychosocial Screening:  Initial Psych Review & Screening - 04/06/23 1339       Initial Review   Current issues with None Identified      Family Dynamics   Good Support System? Yes    Concerns --   None   Comments Has good support from his wife      Barriers   Psychosocial barriers to participate in program There are no identifiable barriers or psychosocial needs.      Screening Interventions   Interventions Encouraged to exercise    Expected Outcomes Long Term Goal: Stressors or current issues are controlled or eliminated.             Quality of Life Scores:  Scores of 19 and below usually indicate a poorer quality of life in these areas.  A difference of  2-3 points is a clinically meaningful difference.  A difference of 2-3 points in the total score of the Quality of Life Index has been associated with significant improvement in overall quality of life, self-image, physical symptoms, and general health in studies assessing change in quality of life.  PHQ-9: Review Flowsheet       04/06/2023  Depression screen PHQ 2/9  Decreased Interest 0  Down, Depressed, Hopeless 0  PHQ - 2 Score 0  Altered sleeping 0  Tired, decreased energy 0  Change in appetite 0  Feeling bad or failure about yourself  0  Trouble concentrating 0  Moving slowly or fidgety/restless 0  Suicidal thoughts 0  PHQ-9 Score 0    Details  Interpretation of Total Score  Total Score Depression Severity:  1-4 = Minimal depression, 5-9 = Mild depression, 10-14 = Moderate depression, 15-19 = Moderately severe depression, 20-27 = Severe depression   Psychosocial Evaluation and  Intervention:  Psychosocial Evaluation - 04/06/23 1340       Psychosocial Evaluation & Interventions   Interventions Encouraged to exercise with the program and follow exercise prescription    Comments Link Snuffer states he is discouraged about the things he once did. He wants to get back to being as active as he can be without dyspnea    Expected Outcomes For Eddie to improve his shortness of breath in everyday acitvities while in the Pulmonary Rehab Program.    Continue Psychosocial Services  No Follow up required             Psychosocial Re-Evaluation:   Psychosocial Discharge (Final Psychosocial Re-Evaluation):   Education: Education Goals: Education classes will be provided on a weekly basis, covering required topics. Participant will state understanding/return demonstration of topics presented.  Learning Barriers/Preferences:  Learning Barriers/Preferences - 04/06/23 1350       Learning Barriers/Preferences   Learning Barriers Sight    Learning Preferences Group Instruction;Individual Instruction;Skilled Demonstration;Verbal Instruction;Written Material             Education Topics: Know Your Numbers Group instruction that is supported by a PowerPoint presentation. Instructor discusses importance of knowing and understanding resting, exercise, and post-exercise oxygen saturation, heart rate, and blood pressure. Oxygen saturation, heart rate, blood pressure, rating of perceived exertion, and dyspnea are reviewed along with a normal range for these values.    Exercise for the Pulmonary Patient Group instruction that is supported by a PowerPoint presentation. Instructor discusses benefits of exercise, core components of exercise, frequency, duration, and intensity of an exercise routine, importance of utilizing pulse oximetry during exercise, safety while exercising, and options of places to exercise outside of rehab.    MET Level  Group instruction provided by  PowerPoint, verbal discussion, and written material to support subject matter. Instructor reviews what METs are and how to increase METs.    Pulmonary Medications Verbally interactive group education provided by instructor with focus on inhaled medications and proper administration.   Anatomy and Physiology of the Respiratory System Group instruction provided by PowerPoint, verbal discussion, and written material to support subject matter. Instructor reviews respiratory cycle and anatomical components of the respiratory system and their functions. Instructor also reviews differences in obstructive and restrictive respiratory diseases with examples of each.    Oxygen Safety Group instruction provided by PowerPoint, verbal discussion, and written material to support subject matter. There is an overview of "What is Oxygen" and "Why do we need it".  Instructor also reviews how to create a safe environment for oxygen use, the importance of using oxygen as prescribed, and the risks of noncompliance. There is a brief discussion on traveling with oxygen and resources the patient may utilize.   Oxygen Use Group instruction provided by PowerPoint, verbal discussion, and written material to discuss how supplemental oxygen is prescribed and different types of oxygen supply systems. Resources for more information are provided.    Breathing Techniques Group instruction that is supported by demonstration and informational handouts. Instructor discusses the benefits of pursed lip and diaphragmatic breathing and detailed demonstration on how to perform both.     Risk Factor Reduction Group instruction that is supported by a PowerPoint presentation. Instructor discusses the definition of a risk factor, different risk factors for pulmonary disease,  and how the heart and lungs work together.   Pulmonary Diseases Group instruction provided by PowerPoint, verbal discussion, and written material to support  subject matter. Instructor gives an overview of the different type of pulmonary diseases. There is also a discussion on risk factors and symptoms as well as ways to manage the diseases.   Stress and Energy Conservation Group instruction provided by PowerPoint, verbal discussion, and written material to support subject matter. Instructor gives an overview of stress and the impact it can have on the body. Instructor also reviews ways to reduce stress. There is also a discussion on energy conservation and ways to conserve energy throughout the day.   Warning Signs and Symptoms Group instruction provided by PowerPoint, verbal discussion, and written material to support subject matter. Instructor reviews warning signs and symptoms of stroke, heart attack, cold and flu. Instructor also reviews ways to prevent the spread of infection.   Other Education Group or individual verbal, written, or video instructions that support the educational goals of the pulmonary rehab program.    Knowledge Questionnaire Score:  Knowledge Questionnaire Score - 04/06/23 1422       Knowledge Questionnaire Score   Pre Score 6/18             Core Components/Risk Factors/Patient Goals at Admission:  Personal Goals and Risk Factors at Admission - 04/06/23 1342       Core Components/Risk Factors/Patient Goals on Admission    Weight Management Weight Maintenance    Improve shortness of breath with ADL's Yes    Intervention Provide education, individualized exercise plan and daily activity instruction to help decrease symptoms of SOB with activities of daily living.    Expected Outcomes Short Term: Improve cardiorespiratory fitness to achieve a reduction of symptoms when performing ADLs;Long Term: Be able to perform more ADLs without symptoms or delay the onset of symptoms    Increase knowledge of respiratory medications and ability to use respiratory devices properly  Yes    Intervention Provide education and  demonstration as needed of appropriate use of medications, inhalers, and oxygen therapy.    Expected Outcomes Short Term: Achieves understanding of medications use. Understands that oxygen is a medication prescribed by physician. Demonstrates appropriate use of inhaler and oxygen therapy.;Long Term: Maintain appropriate use of medications, inhalers, and oxygen therapy.             Core Components/Risk Factors/Patient Goals Review:    Core Components/Risk Factors/Patient Goals at Discharge (Final Review):    ITP Comments:   Comments: Dr. Mechele Collin is Medical Director for Pulmonary Rehab at [facility].

## 2023-04-06 NOTE — Progress Notes (Signed)
Joshua Reed 77 y.o. male  Pulmonary Rehab Orientation Note  This patient who was referred to Pulmonary Rehab by Dr. Marchelle Gearing with the diagnosis of COPD 3 arrived today in Cardiac and Pulmonary Rehab. He arrived ambulatory with normal gait. He does not carry portable oxygen. Per patient, Joshua Reed uses oxygen Color good, skin warm and dry. Patient is oriented to time and place. Patient's medical history, psychosocial health, and medications reviewed.   Psychosocial assessment reveals patient lives with spouse. Joshua Reed is currently retired. Patient hobbies include watching tv and going to his beach house. Patient reports his stress level is low. Areas of stress/anxiety include health. Patient does not exhibit signs of depression. PHQ2/9 score 0/0. Joshua Reed shows good  coping skills with positive outlook on life. Offered emotional support and reassurance. Will continue to monitor and evaluate progress toward psychosocial goal(s) of decreased health related stress.   Physical assessment reveals heart rate is normal, breath sounds diminished to auscultation, no wheezes, rales, or rhonchi. Grip strength equal, strong. Distal pulses present, +2. Joshua Reed reports he  does not take medications as prescribed. Patient states he  follows a regular  diet. The patient reports no specific efforts to gain or lose weight.. Pt's weight will be monitored closely.   Demonstration and practice of PLB using pulse oximeter. Joshua Reed able to return demonstration satisfactorily. Safety and hand hygiene in the exercise area reviewed with patient. Joshua Reed voices understanding of the information reviewed. Department expectations discussed with patient and achievable goals were set. The patient shows enthusiasm about attending the program and we look forward to working with Joshua Reed. Joshua Reed completed a 6 min walk test today and is scheduled to begin exercise on 04/14/23 at 1315.   1300-1406 Essie Hart, RN, BSN

## 2023-04-06 NOTE — Progress Notes (Signed)
Joshua Reed 77 y.o. male  Initial Psychosocial Assessment  Pt psychosocial assessment reveals pt lives with their spouse. Pt is currently retired. Pt hobbies include watching tv and spending time at the beach . Pt reports his  stress level is low. Areas of stress/anxiety include health.  Pt does not exhibit signs of depression. Pt shows good  coping skills with positive outlook. Offered emotional support and reassurance. Monitor and evaluate progress toward psychosocial goal(s).  Goal(s): Improved management of stress Improved coping skills Help patient work toward returning to meaningful activities that improve patient's QOL and are attainable with patient's lung disease   04/06/2023 2:32 PM

## 2023-04-07 ENCOUNTER — Telehealth: Payer: Self-pay | Admitting: Internal Medicine

## 2023-04-07 NOTE — Telephone Encounter (Signed)
Pt wife calling in bc her husband insurance devoted needs a order sent in for his Pulm rehab

## 2023-04-11 NOTE — Telephone Encounter (Signed)
Called Darl Pikes but she did not answer. Left message for her to call back on Monday morning.

## 2023-04-14 ENCOUNTER — Encounter (HOSPITAL_COMMUNITY)
Admission: RE | Admit: 2023-04-14 | Discharge: 2023-04-14 | Disposition: A | Discharge status: Home / Self Care | Payer: No Typology Code available for payment source | Source: Ambulatory Visit | Attending: Internal Medicine | Admitting: Internal Medicine

## 2023-04-14 DIAGNOSIS — J449 Chronic obstructive pulmonary disease, unspecified: Secondary | ICD-10-CM

## 2023-04-14 DIAGNOSIS — Z5189 Encounter for other specified aftercare: Secondary | ICD-10-CM | POA: Diagnosis not present

## 2023-04-14 NOTE — Progress Notes (Signed)
Daily Session Note  Patient Details  Name: Joshua Reed MRN: [id number] Date of Birth: 1946-05-19 Referring Provider:   Doristine Devoid Pulmonary Rehab Walk Test from 04/06/2023 in [facility]  Referring Provider Ramaswamy       Encounter Date: 04/14/2023  Check In:  Session Check In - 04/14/23 1326       Check-In   Supervising physician immediately available to respond to emergencies [facility] MD immediately available    Physician(s) Carlyon Shadow, NP    Location [facility]    Staff Present Essie Hart, RN, BSN;Randi Idelle Crouch BS, ACSM-CEP, Exercise Physiologist;Tresha Muzio Earlene Plater, MS, ACSM-CEP, Exercise Physiologist;Samantha Belarus, RD, Debbora Dus, RT    Virtual Visit No    Medication changes reported     No    Fall or balance concerns reported    No    Tobacco Cessation No Change    Warm-up and Cool-down Performed as group-led instruction     Resistance Training Performed No    VAD Patient? No    PAD/SET Patient? No      Pain Assessment   Currently in Pain? No/denies    Multiple Pain Sites No             Capillary Blood Glucose: No results found for this or any previous visit (from the past 24 hour(s)).    Social History   Tobacco Use  Smoking Status Former   Current packs/day: 0.00   Types: Cigarettes   Quit date: 08/06/2021   Years since quitting: 1.6  Smokeless Tobacco Never    Goals Met:  Proper associated with RPD/PD & O2 Sat Exercise tolerated well No report of concerns or symptoms today Strength training completed today  Goals Unmet:  Not Applicable  Comments: Service time is from 1312 to 1448.    Dr. Mechele Collin is Medical Director for Pulmonary Rehab at [facility].

## 2023-04-16 ENCOUNTER — Telehealth: Payer: Self-pay | Admitting: Family Medicine

## 2023-04-16 ENCOUNTER — Encounter (HOSPITAL_COMMUNITY)
Admission: RE | Admit: 2023-04-16 | Discharge: 2023-04-16 | Disposition: A | Discharge status: Home / Self Care | Payer: No Typology Code available for payment source | Source: Ambulatory Visit | Attending: Internal Medicine | Admitting: Internal Medicine

## 2023-04-16 ENCOUNTER — Ambulatory Visit (INDEPENDENT_AMBULATORY_CARE_PROVIDER_SITE_OTHER): Payer: No Typology Code available for payment source | Admitting: Cardiology

## 2023-04-16 ENCOUNTER — Encounter (HOSPITAL_BASED_OUTPATIENT_CLINIC_OR_DEPARTMENT_OTHER): Payer: Self-pay | Admitting: Cardiology

## 2023-04-16 VITALS — BP 120/60 | HR 75 | Ht 68.0 in | Wt 138.6 lb

## 2023-04-16 DIAGNOSIS — Z5189 Encounter for other specified aftercare: Secondary | ICD-10-CM | POA: Diagnosis not present

## 2023-04-16 DIAGNOSIS — J449 Chronic obstructive pulmonary disease, unspecified: Secondary | ICD-10-CM

## 2023-04-16 DIAGNOSIS — I2510 Atherosclerotic heart disease of native coronary artery without angina pectoris: Secondary | ICD-10-CM

## 2023-04-16 DIAGNOSIS — I2584 Coronary atherosclerosis due to calcified coronary lesion: Secondary | ICD-10-CM

## 2023-04-16 DIAGNOSIS — E7800 Pure hypercholesterolemia, unspecified: Secondary | ICD-10-CM

## 2023-04-16 DIAGNOSIS — Z79899 Other long term (current) drug therapy: Secondary | ICD-10-CM

## 2023-04-16 DIAGNOSIS — I700 Atherosclerosis of aorta: Secondary | ICD-10-CM

## 2023-04-16 MED ORDER — ATORVASTATIN CALCIUM 10 MG PO TABS: 10.0000 mg | ORAL_TABLET | Freq: Every day | ORAL | 3 refills | Status: DC

## 2023-04-16 NOTE — Patient Instructions (Signed)
Medication Instructions:  START ATORVASTATIN 10MG  DAILY *If you need a refill on your cardiac medications before your next appointment, please call your pharmacy*  Lab Work: FASTING LIPID AND LFT IM 2-3 MONTHS If you have labs (blood work) drawn today and your tests are completely normal, you will receive your results only by: MyChart Message (if you have MyChart) OR  A paper copy in the mail If you have any lab test that is abnormal or we need to change your treatment, we will call you to review the results.  Testing/Procedures: FASTING LIPID AND LFT IN 2-3 MONTHS  Follow-Up: At [facility], you and your health needs are our priority.  As part of our continuing mission to provide you with exceptional heart care, we have created designated Provider Care Teams.  These Care Teams include your primary Cardiologist (physician) and Advanced Practice Providers (APPs -  Physician Assistants and Nurse Practitioners) who all work together to provide you with the care you need, when you need it.  Your next appointment:   12 month(s)  Provider:   Jodelle Red, MD
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
## 2023-04-16 NOTE — Telephone Encounter (Signed)
Pt's wife rescheduled appt due to scheduling conflict with another doctor appt.

## 2023-04-16 NOTE — Progress Notes (Signed)
Daily Session Note  Patient Details  Name: Joshua Reed MRN: [id number] Date of Birth: 11/25/1945 Referring Provider:   Doristine Devoid Pulmonary Rehab Walk Test from 04/06/2023 in [facility]  Referring Provider Ramaswamy       Encounter Date: 04/16/2023  Check In:  Session Check In - 04/16/23 1324       Check-In   Supervising physician immediately available to respond to emergencies [facility] MD immediately available    Physician(s) Edd Fabian, NP    [facility]    Staff Present Essie Hart, RN, BSN;Randi Idelle Crouch BS, ACSM-CEP, Exercise Physiologist;Kaylee Earlene Plater, MS, ACSM-CEP, Exercise Physiologist;Samantha Belarus, RD, Debbora Dus, RT    Virtual Visit No    Medication changes reported     No    Fall or balance concerns reported    No    Tobacco Cessation No Change    Warm-up and Cool-down Performed as group-led instruction    Resistance Training Performed Yes    VAD Patient? No    PAD/SET Patient? No      Pain Assessment   Currently in Pain? No/denies    Multiple Pain Sites No             Capillary Blood Glucose: No results found for this or any previous visit (from the past 24 hour(s)).    Social History   Tobacco Use  Smoking Status Former   Current packs/day: 0.00   Types: Cigarettes   Quit date: 08/06/2021   Years since quitting: 1.6  Smokeless Tobacco Never    Goals Met:  Proper associated with RPD/PD & O2 Sat Independence with exercise equipment Exercise tolerated well No report of concerns or symptoms today Strength training completed today  Goals Unmet:  Not Applicable  Comments: Service time is from 1312 to 1438.    Dr. Mechele Collin is Medical Director for Pulmonary Rehab at [facility].

## 2023-04-21 ENCOUNTER — Encounter (HOSPITAL_COMMUNITY)
Admission: RE | Admit: 2023-04-21 | Discharge: 2023-04-21 | Disposition: A | Discharge status: Home / Self Care | Payer: No Typology Code available for payment source | Source: Ambulatory Visit | Attending: Internal Medicine | Admitting: Internal Medicine

## 2023-04-21 ENCOUNTER — Encounter (HOSPITAL_COMMUNITY): Payer: Self-pay

## 2023-04-21 VITALS — Wt 142.2 lb

## 2023-04-21 DIAGNOSIS — J449 Chronic obstructive pulmonary disease, unspecified: Secondary | ICD-10-CM | POA: Diagnosis not present

## 2023-04-21 NOTE — Progress Notes (Signed)
Daily Session Note  Patient Details  Name: Joshua Reed MRN: [id number] Date of Birth: 15-Oct-1945 Referring Provider:   Doristine Devoid Pulmonary Rehab Walk Test from 04/06/2023 in [facility]  Referring Provider Ramaswamy       Encounter Date: 04/21/2023  Check In:  Session Check In - 04/21/23 1331       Check-In   Supervising physician immediately available to respond to emergencies [facility] MD immediately available    Physician(s) Robin Searing, NP    [facility]    Staff Present Essie Hart, RN, BSN;Randi Idelle Crouch BS, ACSM-CEP, Exercise Physiologist;Kaylee Earlene Plater, MS, ACSM-CEP, Exercise Physiologist;Samantha Belarus, [place], Debbora Dus, RT    Virtual Visit No    Medication changes reported     No    Fall or balance concerns reported    No    Tobacco Cessation No Change    Warm-up and Cool-down Performed as group-led instruction    Resistance Training Performed Yes    VAD Patient? No    PAD/SET Patient? No      Pain Assessment   Currently in Pain? No/denies    Multiple Pain Sites No             Capillary Blood Glucose: No results found for this or any previous visit (from the past 24 hour(s)).   Exercise Prescription Changes - 04/21/23 1500       Response to Exercise   Blood Pressure (Admit) 130/56    Blood Pressure (Exercise) 140/70    Blood Pressure (Exit) 106/66    Heart Rate (Admit) 95 bpm    Heart Rate (Exercise) 110 bpm    Heart Rate (Exit) 84 bpm    Oxygen Saturation (Admit) 95 %    Oxygen Saturation (Exercise) 94 %    Oxygen Saturation (Exit) 94 %    Rating of Perceived Exertion (Exercise) 11    Perceived Dyspnea (Exercise) 0    Duration Continue with 30 min of aerobic exercise without signs/symptoms of physical distress.    Intensity THRR unchanged      Progression   Progression Continue to progress workloads to maintain intensity without signs/symptoms of physical  distress.      Resistance Training   Training Prescription Yes    Weight Blue Bands    Reps 10-15    Time 10 Minutes      Treadmill   MPH 2    Grade 0    Minutes 15    METs 2.53      Bike   Level 2    Minutes 15    METs 1.4             Social History   Tobacco Use  Smoking Status Former   Current packs/day: 0.00   Types: Cigarettes   Quit date: 08/06/2021   Years since quitting: 1.7  Smokeless Tobacco Never    Goals Met:  Independence with exercise equipment Exercise tolerated well No report of concerns or symptoms today Strength training completed today  Goals Unmet:  Not Applicable  Comments: Service time is from 1316 to 1436    Dr. Mechele Collin is Medical Director for Pulmonary Rehab at [facility].

## 2023-04-22 NOTE — Progress Notes (Signed)
Pulmonary Individual Treatment Plan  Patient Details  Name: Joshua Reed MRN: [id number] Date of Birth: [birth year] Referring Provider:   Doristine Devoid Pulmonary Rehab Walk Test from 04/06/2023 in [facility]  Referring Provider Ramaswamy       Initial Encounter Date:  Flowsheet Row Pulmonary Rehab Walk Test from 04/06/2023 in [facility], [facility]  Date 04/06/23       Visit Diagnosis: Stage 3 severe COPD by GOLD classification (HCC)  Patient's Home Medications on Admission:   Current Outpatient Medications:    albuterol (PROVENTIL) (2.5 MG/3ML) 0.083% nebulizer solution, Take 3 mLs (2.5 mg total) by nebulization every 6 (six) hours as needed for wheezing or shortness of breath., Disp: 90 mL, Rfl: 3   albuterol (VENTOLIN HFA) 108 (90 Base) MCG/ACT inhaler, Inhale into the lungs every 6 (six) hours as needed., Disp: , Rfl:    aspirin EC 81 MG tablet, Take 1 tablet (81 mg total) by mouth daily. Swallow whole., Disp: 90 tablet, Rfl: 3   atorvastatin (LIPITOR) 10 MG tablet, Take 1 tablet (10 mg total) by mouth daily., Disp: 90 tablet, Rfl: 3   BREZTRI AEROSPHERE 160-9-4.8 MCG/ACT AERO, 2 puffs Inhalation Twice a day for 90 days, Disp: , Rfl:    Cholecalciferol 50 MCG (2000 UT) CAPS, Take 10,000 Units by mouth daily at 6 (six) AM., Disp: , Rfl:    Coenzyme Q10 (CO Q 10 PO), Take 200 mg by mouth daily., Disp: , Rfl:    Cyanocobalamin (B-12 IJ), Takes injection as directed bi weekly, Disp: , Rfl:    ezetimibe (ZETIA) 10 MG tablet, Take 1 tablet (10 mg total) by mouth daily., Disp: 30 tablet, Rfl: 6   fluticasone (FLONASE) 50 MCG/ACT nasal spray, Place 2 sprays into both nostrils daily., Disp: 18.2 mL, Rfl: 2   levETIRAcetam (KEPPRA) 1000 MG tablet, Take 1 tablet (1,000 mg total) by mouth 2 (two) times daily., Disp: 180 tablet, Rfl: 3   MAGNESIUM GLYCINATE PO, Take by mouth., Disp: ,  Rfl:    montelukast (SINGULAIR) 10 MG tablet, Take 1 tablet (10 mg total) by mouth at bedtime., Disp: 30 tablet, Rfl: 5   Multiple Vitamin (MULTIVITAMIN WITH MINERALS) TABS tablet, Take 1 tablet by mouth daily., Disp: , Rfl:    Omega-3 Fatty Acids (FISH OIL PO), Take 1 capsule by mouth daily., Disp: , Rfl:    predniSONE (DELTASONE) 10 MG tablet, Take 4 tabs for 2 days, then 3 tabs for 2 days, 2 tabs for 2 days, then 1 tab for 2 days, then stop., Disp: 20 tablet, Rfl: 0  Past Medical History: Past Medical History:  Diagnosis Date   Alcohol abuse    COPD (chronic obstructive pulmonary disease) (HCC)    Hyperlipidemia    Low vitamin B12 level    Macular degeneration    Seizure disorder (HCC) 2205   ALCOHOL RELATED   Seizures (HCC)    Vitamin D deficiency     Tobacco Use: Social History   Tobacco Use  Smoking Status Former   Current packs/day: 0.00   Types: Cigarettes   Quit date: 08/06/2021   Years since quitting: 1.7  Smokeless Tobacco Never    Labs: Review Flowsheet       Latest Ref Rng & Units 05/24/2014  Labs for ITP Cardiac and Pulmonary Rehab  TCO2 0 - 100 mmol/L 22     Details  Capillary Blood Glucose: Lab Results  Component Value Date   GLUCAP 162 (H) 05/26/2014     Pulmonary Assessment Scores:  Pulmonary Assessment Scores     Row Name 04/06/23 1338         ADL UCSD   ADL Phase Entry     SOB Score total 19       CAT Score   CAT Score 5       mMRC Score   mMRC Score 1             UCSD: Self-administered rating of dyspnea associated with activities of daily living (ADLs) 6-point scale (0 = "not at all" to 5 = "maximal or unable to do because of breathlessness")  Scoring Scores range from 0 to 120.  Minimally important difference is 5 units  CAT: CAT can identify the health impairment of COPD patients and is better correlated with disease progression.  CAT has a scoring range of zero to 40. The CAT score is classified into  four groups of low (less than 10), medium (10 - 20), high (21-30) and very high (31-40) based on the impact level of disease on health status. A CAT score over 10 suggests significant symptoms.  A worsening CAT score could be explained by an exacerbation, poor medication adherence, poor inhaler technique, or progression of COPD or comorbid conditions.  CAT MCID is 2 points  mMRC: mMRC (Modified Medical Research Council) Dyspnea Scale is used to assess the degree of baseline functional disability in patients of respiratory disease due to dyspnea. No minimal important difference is established. A decrease in score of 1 point or greater is considered a positive change.   Pulmonary Function Assessment:  Pulmonary Function Assessment - 04/06/23 1334       Breath   Bilateral Breath Sounds Decreased    Shortness of Breath Yes;Limiting activity             Exercise Target Goals: Exercise Program Goal: Individual exercise prescription set using results from initial 6 min walk test and THRR while considering  patient's activity barriers and safety.   Exercise Prescription Goal: Initial exercise prescription builds to 30-45 minutes a day of aerobic activity, 2-3 days per week.  Home exercise guidelines will be given to patient during program as part of exercise prescription that the participant will acknowledge.  Activity Barriers & Risk Stratification:  Activity Barriers & Cardiac Risk Stratification - 04/06/23 1332       Activity Barriers & Cardiac Risk Stratification   Activity Barriers Deconditioning;Muscular Weakness;Shortness of Breath             6 Minute Walk:  6 Minute Walk     Row Name 04/06/23 1401         6 Minute Walk   Phase Initial     Distance 1260 feet     Walk Time 6 minutes     # of Rest Breaks 0     MPH 2.39     METS 2.88     RPE 9     Perceived Dyspnea  1     VO2 Peak 10.08     Resting HR 61 bpm     Resting BP 126/66     Resting Oxygen Saturation   95 %     Exercise Oxygen Saturation  during 6 min walk 92 %     Max Ex. HR 86 bpm     Max Ex. BP 140/70     2 Minute  Post BP 120/66       Interval HR   1 Minute HR 80     2 Minute HR 86     3 Minute HR 84     4 Minute HR 86     5 Minute HR 84     6 Minute HR 85     2 Minute Post HR 65     Interval Heart Rate? Yes       Interval Oxygen   Interval Oxygen? Yes     Baseline Oxygen Saturation % 95 %     1 Minute Oxygen Saturation % 95 %     1 Minute Liters of Oxygen 0 L     2 Minute Oxygen Saturation % 94 %     2 Minute Liters of Oxygen 0 L     3 Minute Oxygen Saturation % 92 %     3 Minute Liters of Oxygen 0 L     4 Minute Oxygen Saturation % 93 %     4 Minute Liters of Oxygen 0 L     5 Minute Oxygen Saturation % 93 %     5 Minute Liters of Oxygen 0 L     6 Minute Oxygen Saturation % 94 %     6 Minute Liters of Oxygen 0 L     2 Minute Post Oxygen Saturation % 97 %     2 Minute Post Liters of Oxygen 0 L              Oxygen Initial Assessment:  Oxygen Initial Assessment - 04/06/23 1333       Home Oxygen   Home Oxygen Device None    Sleep Oxygen Prescription None    Home Exercise Oxygen Prescription None    Home Resting Oxygen Prescription None      Initial 6 min Walk   Oxygen Used None      Program Oxygen Prescription   Program Oxygen Prescription None      Intervention   Short Term Goals To learn and understand importance of maintaining oxygen saturations>88%;To learn and demonstrate proper use of respiratory medications;To learn and understand importance of monitoring SPO2 with pulse oximeter and demonstrate accurate use of the pulse oximeter.;To learn and demonstrate proper pursed lip breathing techniques or other breathing techniques.     Long  Term Goals Maintenance of O2 saturations>88%;Compliance with respiratory medication;Verbalizes importance of monitoring SPO2 with pulse oximeter and return demonstration;Exhibits proper breathing techniques, such as  pursed lip breathing or other method taught during program session;Demonstrates proper use of MDI's             Oxygen Re-Evaluation:  Oxygen Re-Evaluation     Row Name 04/14/23 0847             Program Oxygen Prescription   Program Oxygen Prescription None         Home Oxygen   Home Oxygen Device None       Sleep Oxygen Prescription None       Home Exercise Oxygen Prescription None       Home Resting Oxygen Prescription None         Goals/Expected Outcomes   Short Term Goals To learn and understand importance of maintaining oxygen saturations>88%;To learn and demonstrate proper use of respiratory medications;To learn and understand importance of monitoring SPO2 with pulse oximeter and demonstrate accurate use of the pulse oximeter.;To learn and demonstrate proper pursed lip breathing techniques or other breathing techniques.  Long  Term Goals Maintenance of O2 saturations>88%;Compliance with respiratory medication;Verbalizes importance of monitoring SPO2 with pulse oximeter and return demonstration;Exhibits proper breathing techniques, such as pursed lip breathing or other method taught during program session;Demonstrates proper use of MDI's       Goals/Expected Outcomes Compliance and understanding of oxygen saturation monitoring and breathing techniques to decrease shortness of breath.                Oxygen Discharge (Final Oxygen Re-Evaluation):  Oxygen Re-Evaluation - 04/14/23 0847       Program Oxygen Prescription   Program Oxygen Prescription None      Home Oxygen   Home Oxygen Device None    Sleep Oxygen Prescription None    Home Exercise Oxygen Prescription None    Home Resting Oxygen Prescription None      Goals/Expected Outcomes   Short Term Goals To learn and understand importance of maintaining oxygen saturations>88%;To learn and demonstrate proper use of respiratory medications;To learn and understand importance of monitoring SPO2 with pulse  oximeter and demonstrate accurate use of the pulse oximeter.;To learn and demonstrate proper pursed lip breathing techniques or other breathing techniques.     Long  Term Goals Maintenance of O2 saturations>88%;Compliance with respiratory medication;Verbalizes importance of monitoring SPO2 with pulse oximeter and return demonstration;Exhibits proper breathing techniques, such as pursed lip breathing or other method taught during program session;Demonstrates proper use of MDI's    Goals/Expected Outcomes Compliance and understanding of oxygen saturation monitoring and breathing techniques to decrease shortness of breath.             Initial Exercise Prescription:  Initial Exercise Prescription - 04/06/23 1400       Date of Initial Exercise RX and Referring Provider   Date 04/06/23    Referring Provider Ramaswamy    Expected Discharge Date 07/02/23      Treadmill   MPH 1.5    Grade 0.5    Minutes 15    METs 2.26      Bike   Level 2    Minutes 15    METs 2      Prescription Details   Frequency (times per week) 2    Duration Progress to 30 minutes of continuous aerobic without signs/symptoms of physical distress      Intensity   THRR 40-80% of Max Heartrate 58-116    Ratings of Perceived Exertion 11-13    Perceived Dyspnea 0-4      Progression   Progression Continue to progress workloads to maintain intensity without signs/symptoms of physical distress.      Resistance Training   Training Prescription Yes    Weight Blue Bands    Reps 10-15             Perform Capillary Blood Glucose checks as needed.  Exercise Prescription Changes:   Exercise Prescription Changes     Row Name 04/21/23 1500             Response to Exercise   Blood Pressure (Admit) 130/56       Blood Pressure (Exercise) 140/70       Blood Pressure (Exit) 106/66       Heart Rate (Admit) 95 bpm       Heart Rate (Exercise) 110 bpm       Heart Rate (Exit) 84 bpm       Oxygen Saturation  (Admit) 95 %       Oxygen Saturation (Exercise) 94 %  Oxygen Saturation (Exit) 94 %       Rating of Perceived Exertion (Exercise) 11       Perceived Dyspnea (Exercise) 0       Duration Continue with 30 min of aerobic exercise without signs/symptoms of physical distress.       Intensity THRR unchanged         Progression   Progression Continue to progress workloads to maintain intensity without signs/symptoms of physical distress.         Resistance Training   Training Prescription Yes       Weight Blue Bands       Reps 10-15       Time 10 Minutes         Treadmill   MPH 2       Grade 0       Minutes 15       METs 2.53         Bike   Level 2       Minutes 15       METs 1.4                Exercise Comments:   Exercise Comments     Row Name 04/14/23 1455           Exercise Comments Pt completed first day of exercise. He exercised for 15 min on the treadmill and upright bike. He averaged 2.4 METs at 2 mph and 0 incline on the treadmill and 1.3 METs at level 2 on the upright bike. Joshua Reed performed the warmup and cooldown standing without limitations. Discussed METs.                Exercise Goals and Review:   Exercise Goals     Row Name 04/06/23 1333             Exercise Goals   Increase Physical Activity Yes       Intervention Provide advice, education, support and counseling about physical activity/exercise needs.;Develop an individualized exercise prescription for aerobic and resistive training based on initial evaluation findings, risk stratification, comorbidities and participant's personal goals.       Expected Outcomes Short Term: Attend rehab on a regular basis to increase amount of physical activity.;Long Term: Exercising regularly at least 3-5 days a week.;Long Term: Add in home exercise to make exercise part of routine and to increase amount of physical activity.       Increase Strength and Stamina Yes       Intervention Provide advice,  education, support and counseling about physical activity/exercise needs.;Develop an individualized exercise prescription for aerobic and resistive training based on initial evaluation findings, risk stratification, comorbidities and participant's personal goals.       Expected Outcomes Short Term: Increase workloads from initial exercise prescription for resistance, speed, and METs.;Short Term: Perform resistance training exercises routinely during rehab and add in resistance training at home;Long Term: Improve cardiorespiratory fitness, muscular endurance and strength as measured by increased METs and functional capacity ( )       Able to understand and use rate of perceived exertion (RPE) scale Yes       Intervention Provide education and explanation on how to use RPE scale       Expected Outcomes Short Term: Able to use RPE daily in rehab to express subjective intensity level;Long Term:  Able to use RPE to guide intensity level when exercising independently       Able to understand and use  Dyspnea scale Yes       Intervention Provide education and explanation on how to use Dyspnea scale       Expected Outcomes Short Term: Able to use Dyspnea scale daily in rehab to express subjective sense of shortness of breath during exertion;Long Term: Able to use Dyspnea scale to guide intensity level when exercising independently       Knowledge and understanding of Target Heart Rate Range (THRR) Yes       Intervention Provide education and explanation of THRR including how the numbers were predicted and where they are located for reference       Expected Outcomes Short Term: Able to state/look up THRR;Short Term: Able to use daily as guideline for intensity in rehab;Long Term: Able to use THRR to govern intensity when exercising independently       Understanding of Exercise Prescription Yes       Intervention Provide education, explanation, and written materials on patient's individual exercise prescription        Expected Outcomes Short Term: Able to explain program exercise prescription;Long Term: Able to explain home exercise prescription to exercise independently                Exercise Goals Re-Evaluation :  Exercise Goals Re-Evaluation     Row Name 04/14/23 0846             Exercise Goal Re-Evaluation   Exercise Goals Review Increase Physical Activity;Able to understand and use Dyspnea scale;Understanding of Exercise Prescription;Increase Strength and Stamina;Knowledge and understanding of Target Heart Rate Range (THRR);Able to understand and use rate of perceived exertion (RPE) scale       Comments Joshua Reed is scheduled to begin group exercise today. Will progress as tolerated.       Expected Outcomes Through exercise at rehab and home, the patient will decrease shortness of breath with daily activities and feel confident in carrying out an exercise regimen at home                Discharge Exercise Prescription (Final Exercise Prescription Changes):  Exercise Prescription Changes - 04/21/23 1500       Response to Exercise   Blood Pressure (Admit) 130/56    Blood Pressure (Exercise) 140/70    Blood Pressure (Exit) 106/66    Heart Rate (Admit) 95 bpm    Heart Rate (Exercise) 110 bpm    Heart Rate (Exit) 84 bpm    Oxygen Saturation (Admit) 95 %    Oxygen Saturation (Exercise) 94 %    Oxygen Saturation (Exit) 94 %    Rating of Perceived Exertion (Exercise) 11    Perceived Dyspnea (Exercise) 0    Duration Continue with 30 min of aerobic exercise without signs/symptoms of physical distress.    Intensity THRR unchanged      Progression   Progression Continue to progress workloads to maintain intensity without signs/symptoms of physical distress.      Resistance Training   Training Prescription Yes    Weight Blue Bands    Reps 10-15    Time 10 Minutes      Treadmill   MPH 2    Grade 0    Minutes 15    METs 2.53      Bike   Level 2    Minutes 15    METs 1.4              Nutrition:  Target Goals: Understanding of nutrition guidelines, daily intake of sodium 1500mg , cholesterol <  200mg , calories 30% from fat and 7% or less from saturated fats, daily to have 5 or more servings of fruits and vegetables.  Biometrics:  Pre Biometrics - 04/06/23 1320       Pre Biometrics   Grip Strength 35 kg              Nutrition Therapy Plan and Nutrition Goals:  Nutrition Therapy & Goals - 04/14/23 1418       Nutrition Therapy   Diet General Healthy diet      Personal Nutrition Goals   Nutrition Goal Patient to maintain diet quality by using the plate method as a guide for meal planning to include lean protein/plant protein, fruits, vegetables, whole grains, nonfat dairy as part of a well-balanced diet.    Comments Joshua Reed reports no nutrition questions/concerns at this time. He does have medical history of COPD, celiac disease, CAD, and hx of alcohol abuse. He quit smoking about two years ago. He reports following a gluten free diet and eating two meals daily. His wife does the majority of the grocery shopping and cooking. He reports current weight is his typical adult weight. Patient will benefit from participation in pulmonary rehab for nutrition, exercise, and lifestyle modification.      Intervention Plan   Intervention Prescribe, educate and counsel regarding individualized specific dietary modifications aiming towards targeted core components such as weight, hypertension, lipid management, diabetes, heart failure and other comorbidities.;Nutrition handout(s) given to patient.    Expected Outcomes Short Term Goal: Understand basic principles of dietary content, such as calories, fat, sodium, cholesterol and nutrients.;Long Term Goal: Adherence to prescribed nutrition plan.             Nutrition Assessments:  Nutrition Assessments - 04/16/23 0946       Rate Your Plate Scores   Pre Score 50            MEDIFICTS Score Key: >=70  Need to make dietary changes  40-70 Heart Healthy Diet <= 40 Therapeutic Level Cholesterol Diet  Flowsheet Row PULMONARY REHAB CHRONIC OBSTRUCTIVE PULMONARY DISEASE from 04/14/2023 in [facility] & Lung [facility]  Picture Your Plate Total Score on Admission 50      Picture Your Plate Scores: <16 Unhealthy dietary pattern with much room for improvement. 41-50 Dietary pattern unlikely to meet recommendations for good health and room for improvement. 51-60 More healthful dietary pattern, with some room for improvement.  >60 Healthy dietary pattern, although there may be some specific behaviors that could be improved.    Nutrition Goals Re-Evaluation:  Nutrition Goals Re-Evaluation     Row Name 04/14/23 1418             Goals   Current Weight 139 lb 5.3 oz (63.2 kg)       Comment PCP labs not available through [employer]- completed through [place].       Expected Outcome Joshua Reed reports no nutrition questions/concerns at this time. He does have medical history of COPD, celiac disease, CAD and hx of alcohol abuse. He quit smoking about two years ago. He reports following a gluten free diet and eating two meals daily. His wife does the majority of the grocery shopping and cooking. He reports current weight is his typical adult weight. Patient will benefit from participation in pulmonary rehab for nutrition, exercise, and lifestyle modification.                Nutrition Goals Discharge (Final Nutrition Goals Re-Evaluation):  Nutrition Goals Re-Evaluation - 04/14/23 1418       Goals   Current Weight 139 lb 5.3 oz (63.2 kg)    Comment PCP labs not available through [employer]- completed through [place].    Expected Outcome Joshua Reed reports no nutrition questions/concerns at this time. He does have medical history of COPD, celiac disease, CAD and hx of alcohol abuse. He quit smoking about two years ago. He reports following a gluten free diet and eating two  meals daily. His wife does the majority of the grocery shopping and cooking. He reports current weight is his typical adult weight. Patient will benefit from participation in pulmonary rehab for nutrition, exercise, and lifestyle modification.             Psychosocial: Target Goals: Acknowledge presence or absence of significant depression and/or stress, maximize coping skills, provide positive support system. Participant is able to verbalize types and ability to use techniques and skills needed for reducing stress and depression.  Initial Review & Psychosocial Screening:  Initial Psych Review & Screening - 04/06/23 1339       Initial Review   Current issues with None Identified      Family Dynamics   Good Support System? Yes    Concerns --   None   Comments Has good support from his wife      Barriers   Psychosocial barriers to participate in program There are no identifiable barriers or psychosocial needs.      Screening Interventions   Interventions Encouraged to exercise    Expected Outcomes Long Term Goal: Stressors or current issues are controlled or eliminated.             Quality of Life Scores:  Scores of 19 and below usually indicate a poorer quality of life in these areas.  A difference of  2-3 points is a clinically meaningful difference.  A difference of 2-3 points in the total score of the Quality of Life Index has been associated with significant improvement in overall quality of life, self-image, physical symptoms, and general health in studies assessing change in quality of life.  PHQ-9: Review Flowsheet       04/06/2023  Depression screen PHQ 2/9  Decreased Interest 0  Down, Depressed, Hopeless 0  PHQ - 2 Score 0  Altered sleeping 0  Tired, decreased energy 0  Change in appetite 0  Feeling bad or failure about yourself  0  Trouble concentrating 0  Moving slowly or fidgety/restless 0  Suicidal thoughts 0  PHQ-9 Score 0    Details            Interpretation of Total Score  Total Score Depression Severity:  1-4 = Minimal depression, 5-9 = Mild depression, 10-14 = Moderate depression, 15-19 = Moderately severe depression, 20-27 = Severe depression   Psychosocial Evaluation and Intervention:  Psychosocial Evaluation - 04/06/23 1340       Psychosocial Evaluation & Interventions   Interventions Encouraged to exercise with the program and follow exercise prescription    Comments Joshua Reed states he is discouraged about the things he once did. He wants to get back to being as active as he can be without dyspnea    Expected Outcomes For Joshua Reed to improve his shortness of breath in everyday acitvities while in the Pulmonary Rehab Program.    Continue Psychosocial Services  No Follow up required             Psychosocial Re-Evaluation:  Psychosocial Re-Evaluation  Row Name 04/15/23 1119             Psychosocial Re-Evaluation   Current issues with None Identified       Comments No changes since orientation. Joshua Reed has completed 1 session so far.       Expected Outcomes For Joshua Reed to attend PR free of any psychosocial barriers or concerns       Interventions Encouraged to attend Pulmonary Rehabilitation for the exercise       Continue Psychosocial Services  No Follow up required                Psychosocial Discharge (Final Psychosocial Re-Evaluation):  Psychosocial Re-Evaluation - 04/15/23 1119       Psychosocial Re-Evaluation   Current issues with None Identified    Comments No changes since orientation. Joshua Reed has completed 1 session so far.    Expected Outcomes For Joshua Reed to attend PR free of any psychosocial barriers or concerns    Interventions Encouraged to attend Pulmonary Rehabilitation for the exercise    Continue Psychosocial Services  No Follow up required             Education: Education Goals: Education classes will be provided on a weekly basis, covering required topics. Participant will state  understanding/return demonstration of topics presented.  Learning Barriers/Preferences:  Learning Barriers/Preferences - 04/06/23 1350       Learning Barriers/Preferences   Learning Barriers Sight    Learning Preferences Group Instruction;Individual Instruction;Skilled Demonstration;Verbal Instruction;Written Material             Education Topics: Know Your Numbers Group instruction that is supported by a PowerPoint presentation. Instructor discusses importance of knowing and understanding resting, exercise, and post-exercise oxygen saturation, heart rate, and blood pressure. Oxygen saturation, heart rate, blood pressure, rating of perceived exertion, and dyspnea are reviewed along with a normal range for these values.    Exercise for the Pulmonary Patient Group instruction that is supported by a PowerPoint presentation. Instructor discusses benefits of exercise, core components of exercise, frequency, duration, and intensity of an exercise routine, importance of utilizing pulse oximetry during exercise, safety while exercising, and options of places to exercise outside of rehab.  Flowsheet Row PULMONARY REHAB CHRONIC OBSTRUCTIVE PULMONARY DISEASE from 04/16/2023 in [facility]  Date 04/16/23  Educator EP  Instruction Review Code 1- Verbalizes Understanding       MET Level  Group instruction provided by PowerPoint, verbal discussion, and written material to support subject matter. Instructor reviews what METs are and how to increase METs.    Pulmonary Medications Verbally interactive group education provided by instructor with focus on inhaled medications and proper administration.   Anatomy and Physiology of the Respiratory System Group instruction provided by PowerPoint, verbal discussion, and written material to support subject matter. Instructor reviews respiratory cycle and anatomical components of the respiratory  system and their functions. Instructor also reviews differences in obstructive and restrictive respiratory diseases with examples of each.    Oxygen Safety Group instruction provided by PowerPoint, verbal discussion, and written material to support subject matter. There is an overview of "What is Oxygen" and "Why do we need it".  Instructor also reviews how to create a safe environment for oxygen use, the importance of using oxygen as prescribed, and the risks of noncompliance. There is a brief discussion on traveling with oxygen and resources the patient may utilize.   Oxygen Use Group instruction provided by PowerPoint, verbal  discussion, and written material to discuss how supplemental oxygen is prescribed and different types of oxygen supply systems. Resources for more information are provided.    Breathing Techniques Group instruction that is supported by demonstration and informational handouts. Instructor discusses the benefits of pursed lip and diaphragmatic breathing and detailed demonstration on how to perform both.     Risk Factor Reduction Group instruction that is supported by a PowerPoint presentation. Instructor discusses the definition of a risk factor, different risk factors for pulmonary disease, and how the heart and lungs work together.   Pulmonary Diseases Group instruction provided by PowerPoint, verbal discussion, and written material to support subject matter. Instructor gives an overview of the different type of pulmonary diseases. There is also a discussion on risk factors and symptoms as well as ways to manage the diseases.   Stress and Energy Conservation Group instruction provided by PowerPoint, verbal discussion, and written material to support subject matter. Instructor gives an overview of stress and the impact it can have on the body. Instructor also reviews ways to reduce stress. There is also a discussion on energy conservation and ways to conserve energy  throughout the day.   Warning Signs and Symptoms Group instruction provided by PowerPoint, verbal discussion, and written material to support subject matter. Instructor reviews warning signs and symptoms of stroke, heart attack, cold and flu. Instructor also reviews ways to prevent the spread of infection.   Other Education Group or individual verbal, written, or video instructions that support the educational goals of the pulmonary rehab program.    Knowledge Questionnaire Score:  Knowledge Questionnaire Score - 04/06/23 1422       Knowledge Questionnaire Score   Pre Score 6/18             Core Components/Risk Factors/Patient Goals at Admission:  Personal Goals and Risk Factors at Admission - 04/06/23 1342       Core Components/Risk Factors/Patient Goals on Admission    Weight Management Weight Maintenance    Improve shortness of breath with ADL's Yes    Intervention Provide education, individualized exercise plan and daily activity instruction to help decrease symptoms of SOB with activities of daily living.    Expected Outcomes Short Term: Improve cardiorespiratory fitness to achieve a reduction of symptoms when performing ADLs;Long Term: Be able to perform more ADLs without symptoms or delay the onset of symptoms    Increase knowledge of respiratory medications and ability to use respiratory devices properly  Yes    Intervention Provide education and demonstration as needed of appropriate use of medications, inhalers, and oxygen therapy.    Expected Outcomes Short Term: Achieves understanding of medications use. Understands that oxygen is a medication prescribed by physician. Demonstrates appropriate use of inhaler and oxygen therapy.;Long Term: Maintain appropriate use of medications, inhalers, and oxygen therapy.             Core Components/Risk Factors/Patient Goals Review:   Goals and Risk Factor Review     Row Name 04/15/23 1121             Core  Components/Risk Factors/Patient Goals Review   Personal Goals Review Weight Management/Obesity;Improve shortness of breath with ADL's;Develop more efficient breathing techniques such as purse lipped breathing and diaphragmatic breathing and practicing self-pacing with activity.;Increase knowledge of respiratory medications and ability to use respiratory devices properly.       Review Unable to assess goals. Joshua Reed has completed 1 session.       Expected Outcomes For [employer]  to maintain his weight, improve his shortness of breath with ADLs, develop more efficient breathing techniques, learn how to self-pace, and increase his knowledge of respiratory medications.                Core Components/Risk Factors/Patient Goals at Discharge (Final Review):   Goals and Risk Factor Review - 04/15/23 1121       Core Components/Risk Factors/Patient Goals Review   Personal Goals Review Weight Management/Obesity;Improve shortness of breath with ADL's;Develop more efficient breathing techniques such as purse lipped breathing and diaphragmatic breathing and practicing self-pacing with activity.;Increase knowledge of respiratory medications and ability to use respiratory devices properly.    Review Unable to assess goals. Joshua Reed has completed 1 session.    Expected Outcomes For Joshua Reed to maintain his weight, improve his shortness of breath with ADLs, develop more efficient breathing techniques, learn how to self-pace, and increase his knowledge of respiratory medications.             ITP Comments:Pt is making expected progress toward Pulmonary Rehab goals after completing 3 sessions. Recommend continued exercise, life style modification, education, and utilization of breathing techniques to increase stamina and strength, while also decreasing shortness of breath with exertion.  Dr. Mechele Collin is Medical Director for Pulmonary Rehab at [facility].     Comments: Dr. Mechele Collin is Medical Director  for Pulmonary Rehab at [facility].

## 2023-04-23 ENCOUNTER — Ambulatory Visit: Payer: No Typology Code available for payment source | Admitting: Family Medicine

## 2023-04-23 ENCOUNTER — Encounter (HOSPITAL_COMMUNITY)
Admission: RE | Admit: 2023-04-23 | Discharge: 2023-04-23 | Disposition: A | Discharge status: Home / Self Care | Payer: No Typology Code available for payment source | Source: Ambulatory Visit | Attending: Internal Medicine | Admitting: Internal Medicine

## 2023-04-23 DIAGNOSIS — J449 Chronic obstructive pulmonary disease, unspecified: Secondary | ICD-10-CM

## 2023-04-23 NOTE — Progress Notes (Signed)
Daily Session Note  Patient Details  Name: Joshua Reed MRN: [id number] Date of Birth: 07-25-1945 Referring Provider:   Doristine Devoid Pulmonary Rehab Walk Test from 04/06/2023 in [facility]  Referring Provider Ramaswamy       Encounter Date: 04/23/2023  Check In:  Session Check In - 04/23/23 1528       Check-In   Supervising physician immediately available to respond to emergencies [facility] MD immediately available    Physician(s) Joni Reining, NP    Location [facility]    Staff Present Essie Hart, RN, BSN;Randi Idelle Crouch BS, ACSM-CEP, Exercise Physiologist;Kaylee Earlene Plater, MS, ACSM-CEP, Exercise Physiologist;Kynadee Dam Katrinka Blazing, RT    Virtual Visit No    Medication changes reported     No    Fall or balance concerns reported    No    Tobacco Cessation No Change    Warm-up and Cool-down Performed as group-led instruction    Resistance Training Performed Yes    VAD Patient? No    PAD/SET Patient? No      Pain Assessment   Currently in Pain? No/denies    Multiple Pain Sites No             Capillary Blood Glucose: No results found for this or any previous visit (from the past 24 hour(s)).    Social History   Tobacco Use  Smoking Status Former   Current packs/day: 0.00   Types: Cigarettes   Quit date: 08/06/2021   Years since quitting: 1.7  Smokeless Tobacco Never    Goals Met:  Proper associated with RPD/PD & O2 Sat Independence with exercise equipment Exercise tolerated well No report of concerns or symptoms today Strength training completed today  Goals Unmet:  Not Applicable  Comments: Service time is from 1315 to 1457.    Dr. Mechele Collin is Medical Director for Pulmonary Rehab at [facility].

## 2023-04-28 ENCOUNTER — Encounter (HOSPITAL_COMMUNITY)
Admission: RE | Admit: 2023-04-28 | Discharge: 2023-04-28 | Disposition: A | Discharge status: Home / Self Care | Payer: No Typology Code available for payment source | Source: Ambulatory Visit | Attending: Internal Medicine | Admitting: Internal Medicine

## 2023-04-28 DIAGNOSIS — J449 Chronic obstructive pulmonary disease, unspecified: Secondary | ICD-10-CM | POA: Diagnosis not present

## 2023-04-28 NOTE — Progress Notes (Signed)
Daily Session Note  Patient Details  Name: Joshua Reed MRN: [id number] Date of Birth: 12-08-1945 Referring Provider:   Doristine Devoid Pulmonary Rehab Walk Test from 04/06/2023 in [facility], [facility], [facility]  Referring Provider Ramaswamy       Encounter Date: 04/28/2023  Check In:  Session Check In - 04/28/23 1436       Check-In   Supervising physician immediately available to respond to emergencies [facility] MD immediately available    Physician(s) Joni Reining, NP    [facility]    Staff Present Elissa Lovett BS, ACSM-CEP, Exercise Physiologist;Kaylee Earlene Plater, MS, ACSM-CEP, Exercise Physiologist;[place] Thedore Mins, RN, BSN    Virtual Visit No    Medication changes reported     No    Fall or balance concerns reported    No    Tobacco Cessation No Change    Warm-up and Cool-down Performed as group-led instruction    Resistance Training Performed Yes    VAD Patient? No    PAD/SET Patient? No      Pain Assessment   Currently in Pain? No/denies    Multiple Pain Sites No             Capillary Blood Glucose: No results found for this or any previous visit (from the past 24 hour(s)).    Social History   Tobacco Use  Smoking Status Former   Current packs/day: 0.00   Types: Cigarettes   Quit date: 08/06/2021   Years since quitting: 1.7  Smokeless Tobacco Never    Goals Met:  Proper associated with RPD/PD & O2 Sat Independence with exercise equipment Exercise tolerated well No report of concerns or symptoms today Strength training completed today  Goals Unmet:  Not Applicable  Comments: Service time is from 1307 to 1440.    Dr. Mechele Collin is Medical Director for Pulmonary Rehab at [facility].

## 2023-04-30 ENCOUNTER — Encounter (HOSPITAL_COMMUNITY)
Admission: RE | Admit: 2023-04-30 | Discharge: 2023-04-30 | Disposition: A | Discharge status: Home / Self Care | Payer: No Typology Code available for payment source | Source: Ambulatory Visit | Attending: Internal Medicine | Admitting: Internal Medicine

## 2023-04-30 DIAGNOSIS — J449 Chronic obstructive pulmonary disease, unspecified: Secondary | ICD-10-CM | POA: Diagnosis not present

## 2023-04-30 NOTE — Progress Notes (Signed)
Daily Session Note  Patient Details  Name: Joshua Reed MRN: [id number] Date of Birth: Aug 20, 1945 Referring Provider:   Doristine Devoid Pulmonary Rehab Walk Test from 04/06/2023 in [facility]  Referring Provider Ramaswamy       Encounter Date: 04/30/2023  Check In:  Session Check In - 04/30/23 1333       Check-In   Supervising physician immediately available to respond to emergencies [facility] immediately available    Physician(s) Jari Favre, NP    [facility]    Staff Present Elissa Lovett BS, ACSM-CEP, Exercise Physiologist;Kaylee Earlene Plater, MS, ACSM-CEP, Exercise Physiologist;Casey Hermine Messick Belarus, RD, Dutch Gray, RN, BSN    Virtual Visit No    Medication changes reported     No    Fall or balance concerns reported    No    Tobacco Cessation No Change    Warm-up and Cool-down Performed as group-led instruction    Resistance Training Performed Yes    VAD Patient? No    PAD/SET Patient? No      Pain Assessment   Currently in Pain? No/denies    Multiple Pain Sites No             Capillary Blood Glucose: No results found for this or any previous visit (from the past 24 hour(s)).    Social History   Tobacco Use  Smoking Status Former   Current packs/day: 0.00   Types: Cigarettes   Quit date: 08/06/2021   Years since quitting: 1.7  Smokeless Tobacco Never    Goals Met:  Independence with exercise equipment Exercise tolerated well No report of concerns or symptoms today Strength training completed today  Goals Unmet:  Not Applicable  Comments: Service time is from 1313 to 1440.  Dr. Mechele Collin is Medical Director for Pulmonary Rehab at [facility].

## 2023-05-05 ENCOUNTER — Encounter (HOSPITAL_COMMUNITY)
Admission: RE | Admit: 2023-05-05 | Discharge: 2023-05-05 | Disposition: A | Discharge status: Home / Self Care | Payer: No Typology Code available for payment source | Source: Ambulatory Visit | Attending: Internal Medicine | Admitting: Internal Medicine

## 2023-05-05 VITALS — Wt 138.9 lb

## 2023-05-05 DIAGNOSIS — J449 Chronic obstructive pulmonary disease, unspecified: Secondary | ICD-10-CM

## 2023-05-05 NOTE — Progress Notes (Signed)
Daily Session Note  Patient Details  Name: Joshua Reed MRN: [id number] Date of Birth: [birth year] Referring Provider:   Doristine Devoid Pulmonary Rehab Walk Test from 04/06/2023 in [facility] for Heart, Vascular, & Lung Health  Referring Provider Ramaswamy       Encounter Date: 05/05/2023  Check In:  Session Check In - 05/05/23 1425       Check-In   Supervising physician immediately available to respond to emergencies [facility] immediately available    Physician(s) Jari Favre, NP    [facility]    Staff Present Elissa Lovett BS, ACSM-CEP, Exercise Physiologist;Braelin Brosch Hermine Messick Belarus, RD, Dutch Gray, RN, BSN    Virtual Visit No    Medication changes reported     No    Fall or balance concerns reported    No    Tobacco Cessation No Change    Warm-up and Cool-down Performed as group-led instruction    Resistance Training Performed Yes    VAD Patient? No    PAD/SET Patient? No      Pain Assessment   Currently in Pain? No/denies    Multiple Pain Sites No             Capillary Blood Glucose: No results found for this or any previous visit (from the past 24 hour(s)).   Exercise Prescription Changes - 05/05/23 1500       Response to Exercise   Blood Pressure (Admit) 124/54    Blood Pressure (Exercise) 142/60    Blood Pressure (Exit) 108/58    Heart Rate (Admit) 64 bpm    Heart Rate (Exercise) 98 bpm    Heart Rate (Exit) 78 bpm    Oxygen Saturation (Admit) 96 %    Oxygen Saturation (Exercise) 94 %    Oxygen Saturation (Exit) 97 %    Rating of Perceived Exertion (Exercise) 11    Perceived Dyspnea (Exercise) 0    Duration Continue with 30 min of aerobic exercise without signs/symptoms of physical distress.    Intensity THRR unchanged      Progression   Progression Continue to progress workloads to maintain intensity without signs/symptoms of physical distress.      Resistance Training   Training  Prescription Yes    Weight Blue Bands    Reps 10-15    Time 10 Minutes      Treadmill   MPH 2.4    Grade 1.5    Minutes 15    METs 3.33      Bike   Level 3    Minutes 15    METs 2             Social History   Tobacco Use  Smoking Status Former   Current packs/day: 0.00   Types: Cigarettes   Quit date: 08/06/2021   Years since quitting: 1.7  Smokeless Tobacco Never    Goals Met:  Proper associated with RPD/PD & O2 Sat Independence with exercise equipment Exercise tolerated well No report of concerns or symptoms today Strength training completed today  Goals Unmet:  Not Applicable  Comments: Service time is from 1312 to 1440.  Dr. Mechele Collin is Medical Director for Pulmonary Rehab at [facility].

## 2023-05-07 ENCOUNTER — Encounter (HOSPITAL_COMMUNITY)
Admission: RE | Admit: 2023-05-07 | Discharge: 2023-05-07 | Disposition: A | Discharge status: Home / Self Care | Payer: No Typology Code available for payment source | Source: Ambulatory Visit | Attending: Internal Medicine | Admitting: Internal Medicine

## 2023-05-07 DIAGNOSIS — J449 Chronic obstructive pulmonary disease, unspecified: Secondary | ICD-10-CM | POA: Diagnosis not present

## 2023-05-07 NOTE — Progress Notes (Signed)
Daily Session Note  Patient Details  Name: Joshua Reed MRN: [id number] Date of Birth: October 08, 1945 Referring Provider:   Doristine Devoid Pulmonary Rehab Walk Test from 04/06/2023 in [facility] for [facility], V[facility], & Lung [facility]  Referring Provider Ramaswamy       Encounter Date: 05/07/2023  Check In:  Session Check In - 05/07/23 1410       Check-In   Supervising physician immediately available to respond to emergencies [facility] immediately available    Physician(s) Jari Favre, NP    [facility]    Staff Present Elissa Lovett BS, ACSM-CEP, Exercise Physiologist;Clinton Wahlberg Hermine Messick Belarus, RD, Dutch Gray, RN, Doris Cheadle, MS, ACSM-CEP, Exercise Physiologist    Virtual Visit No    Medication changes reported     No    Fall or balance concerns reported    No    Tobacco Cessation No Change    Warm-up and Cool-down Performed as group-led instruction    Resistance Training Performed Yes    VAD Patient? No    PAD/SET Patient? No      Pain Assessment   Currently in Pain? No/denies    Multiple Pain Sites No             Capillary Blood Glucose: No results found for this or any previous visit (from the past 24 hour(s)).    Social History   Tobacco Use  Smoking Status Former   Current packs/day: 0.00   Types: Cigarettes   Quit date: 08/06/2021   Years since quitting: 1.7  Smokeless Tobacco Never    Goals Met:  Proper associated with RPD/PD & O2 Sat Independence with exercise equipment Exercise tolerated well No report of concerns or symptoms today Strength training completed today  Goals Unmet:  Not Applicable  Comments: Service time is from 1314 to 1449  Dr. Mechele Collin is Medical Director for Pulmonary Rehab at [facility].

## 2023-05-08 ENCOUNTER — Telehealth (HOSPITAL_COMMUNITY): Payer: Self-pay

## 2023-05-08 NOTE — Telephone Encounter (Signed)
Dr. Marchelle Gearing, Can Eddie have an order for an increase in his target heart rate while exercising in PR? We need an increase up to 130.

## 2023-05-08 NOTE — Telephone Encounter (Signed)
That is fine. If it keeps increasing there is a medicine called Ivarabdine that we can entertain in conjunction with cardiology

## 2023-05-12 ENCOUNTER — Encounter (HOSPITAL_COMMUNITY)
Admission: RE | Admit: 2023-05-12 | Discharge: 2023-05-12 | Disposition: A | Discharge status: Home / Self Care | Payer: No Typology Code available for payment source | Source: Ambulatory Visit | Attending: Internal Medicine | Admitting: Internal Medicine

## 2023-05-12 DIAGNOSIS — J449 Chronic obstructive pulmonary disease, unspecified: Secondary | ICD-10-CM

## 2023-05-12 NOTE — Progress Notes (Signed)
Daily Session Note  Patient Details  Name: Joshua Reed MRN: [id number] Date of Birth: 1945/12/09 Referring Provider:   Doristine Devoid Pulmonary Rehab Walk Test from 04/06/2023 in [facility] for Heart, Vascular, & Lung Health  Referring Provider Ramaswamy       Encounter Date: 05/12/2023  Check In:  Session Check In - 05/12/23 1331       Check-In   Supervising physician immediately available to respond to emergencies [facility] MD immediately available    Physician(s) Edd Fabian, NP    [facility]    Staff Present Elissa Lovett BS, ACSM-CEP, Exercise Physiologist;Deborah Lazcano Hermine Messick Belarus, RD, Dutch Gray, RN, Doris Cheadle, MS, ACSM-CEP, Exercise Physiologist    Virtual Visit No    Medication changes reported     No    Fall or balance concerns reported    No    Tobacco Cessation No Change    Warm-up and Cool-down Performed as group-led instruction    Resistance Training Performed Yes    VAD Patient? No    PAD/SET Patient? No      Pain Assessment   Currently in Pain? No/denies    Multiple Pain Sites No             Capillary Blood Glucose: No results found for this or any previous visit (from the past 24 hour(s)).    Social History   Tobacco Use  Smoking Status Former   Current packs/day: 0.00   Types: Cigarettes   Quit date: 08/06/2021   Years since quitting: 1.7  Smokeless Tobacco Never    Goals Met:  Proper associated with RPD/PD & O2 Sat Independence with exercise equipment Exercise tolerated well No report of concerns or symptoms today Strength training completed today  Goals Unmet:  Not Applicable  Comments: Service time is from 1317 to 1435.  Dr. Mechele Collin is Medical Director for Pulmonary Rehab at [facility].

## 2023-05-14 ENCOUNTER — Encounter (HOSPITAL_COMMUNITY)
Admission: RE | Admit: 2023-05-14 | Discharge: 2023-05-14 | Disposition: A | Discharge status: Home / Self Care | Payer: No Typology Code available for payment source | Source: Ambulatory Visit | Attending: Internal Medicine | Admitting: Internal Medicine

## 2023-05-14 VITALS — Wt 136.9 lb

## 2023-05-14 DIAGNOSIS — J449 Chronic obstructive pulmonary disease, unspecified: Secondary | ICD-10-CM

## 2023-05-14 NOTE — Progress Notes (Signed)
Daily Session Note  Patient Details  Name: SEYMORE AYLES MRN: [id number] Date of Birth: 01/30/1946 Referring Provider:   Doristine Devoid Pulmonary Rehab Walk Test from 04/06/2023 in [facility] for Heart, Vascular, & Lung Health  Referring Provider Ramaswamy       Encounter Date: 05/14/2023  Check In:  Session Check In - 05/14/23 1518       Check-In   Supervising physician immediately available to respond to emergencies [facility] MD immediately available    Physician(s) Eligha Bridegroom, NP    [facility]    Staff Present Elissa Lovett BS, ACSM-CEP, Exercise Physiologist;Twan Harkin Hermine Messick Belarus, RD, Dutch Gray, RN, Doris Cheadle, MS, ACSM-CEP, Exercise Physiologist    Virtual Visit No    Medication changes reported     No    Fall or balance concerns reported    No    Tobacco Cessation No Change    Warm-up and Cool-down Performed as group-led instruction    Resistance Training Performed Yes    VAD Patient? No    PAD/SET Patient? No      Pain Assessment   Currently in Pain? No/denies    Multiple Pain Sites No             Capillary Blood Glucose: No results found for this or any previous visit (from the past 24 hour(s)).    Social History   Tobacco Use  Smoking Status Former   Current packs/day: 0.00   Types: Cigarettes   Quit date: 08/06/2021   Years since quitting: 1.7  Smokeless Tobacco Never    Goals Met:  Proper associated with RPD/PD & O2 Sat Independence with exercise equipment Exercise tolerated well No report of concerns or symptoms today Strength training completed today  Goals Unmet:  Not Applicable  Comments: Service time is from 1320 to 1452.  Dr. Mechele Collin is Medical Director for Pulmonary Rehab at [facility].

## 2023-05-19 ENCOUNTER — Telehealth (HOSPITAL_COMMUNITY): Payer: Self-pay

## 2023-05-19 ENCOUNTER — Encounter (HOSPITAL_COMMUNITY): Payer: No Typology Code available for payment source

## 2023-05-19 NOTE — Telephone Encounter (Signed)
Joshua Reed [facility] wife) called and left a VM. Georgie has been under the weather and will not be coming in Tuesday and Thursday this week. She also stated "He probably wont be coming back. He just doesn't feel like it has helped him  at all. He can walk in our neighborhood and doesn't have to pay a copay at all. So we are just kinda disappointed in it and he is walking in the neighborhood and probably isn't coming back but thank you for everything."

## 2023-05-19 NOTE — Telephone Encounter (Signed)
RN called patient since calling out of class for today. Spoke to patient's wife, possibly would like to discharge from the program. Pt will call back regarding his decision.

## 2023-05-20 NOTE — Progress Notes (Signed)
Pulmonary Individual Treatment Plan  Patient Details  Name: Joshua Reed MRN: [id number] Date of Birth: 1945-09-30 Referring Provider:   Doristine Devoid Pulmonary Rehab Walk Test from 04/06/2023 in [facility]  Referring Provider Ramaswamy       Initial Encounter Date:  Flowsheet Row Pulmonary Rehab Walk Test from 04/06/2023 in [facility]  Date 04/06/23       Visit Diagnosis: Stage 3 severe COPD by GOLD classification (HCC)  Patient's Home Medications on Admission:   Current Outpatient Medications:    albuterol (PROVENTIL) (2.5 MG/3ML) 0.083% nebulizer solution, Take 3 mLs (2.5 mg total) by nebulization every 6 (six) hours as needed for wheezing or shortness of breath., Disp: 90 mL, Rfl: 3   albuterol (VENTOLIN HFA) 108 (90 Base) MCG/ACT inhaler, Inhale into the lungs every 6 (six) hours as needed., Disp: , Rfl:    aspirin EC 81 MG tablet, Take 1 tablet (81 mg total) by mouth daily. Swallow whole., Disp: 90 tablet, Rfl: 3   atorvastatin (LIPITOR) 10 MG tablet, Take 1 tablet (10 mg total) by mouth daily., Disp: 90 tablet, Rfl: 3   BREZTRI AEROSPHERE 160-9-4.8 MCG/ACT AERO, 2 puffs Inhalation Twice a day for 90 days, Disp: , Rfl:    Cholecalciferol 50 MCG (2000 UT) CAPS, Take 10,000 Units by mouth daily at 6 (six) AM., Disp: , Rfl:    Coenzyme Q10 (CO Q 10 PO), Take 200 mg by mouth daily., Disp: , Rfl:    Cyanocobalamin (B-12 IJ), Takes injection as directed bi weekly, Disp: , Rfl:    ezetimibe (ZETIA) 10 MG tablet, Take 1 tablet (10 mg total) by mouth daily., Disp: 30 tablet, Rfl: 6   fluticasone (FLONASE) 50 MCG/ACT nasal spray, Place 2 sprays into both nostrils daily., Disp: 18.2 mL, Rfl: 2   levETIRAcetam (KEPPRA) 1000 MG tablet, Take 1 tablet (1,000 mg total) by mouth 2 (two) times daily., Disp: 180 tablet, Rfl: 3   MAGNESIUM GLYCINATE PO, Take by mouth., Disp: ,  Rfl:    montelukast (SINGULAIR) 10 MG tablet, Take 1 tablet (10 mg total) by mouth at bedtime., Disp: 30 tablet, Rfl: 5   Multiple Vitamin (MULTIVITAMIN WITH MINERALS) TABS tablet, Take 1 tablet by mouth daily., Disp: , Rfl:    Omega-3 Fatty Acids (FISH OIL PO), Take 1 capsule by mouth daily., Disp: , Rfl:    predniSONE (DELTASONE) 10 MG tablet, Take 4 tabs for 2 days, then 3 tabs for 2 days, 2 tabs for 2 days, then 1 tab for 2 days, then stop., Disp: 20 tablet, Rfl: 0  Past Medical History: Past Medical History:  Diagnosis Date   Alcohol abuse    COPD (chronic obstructive pulmonary disease) (HCC)    Hyperlipidemia    Low vitamin B12 level    Macular degeneration    Seizure disorder (HCC) 2205   ALCOHOL RELATED   Seizures (HCC)    Vitamin D deficiency     Tobacco Use: Social History   Tobacco Use  Smoking Status Former   Current packs/day: 0.00   Types: Cigarettes   Quit date: 08/06/2021   Years since quitting: 1.7  Smokeless Tobacco Never    Labs: Review Flowsheet       Latest Ref Rng & Units 05/24/2014  Labs for ITP Cardiac and Pulmonary Rehab  TCO2 0 - 100 mmol/L 22     Details  Capillary Blood Glucose: Lab Results  Component Value Date   GLUCAP 162 (H) 05/26/2014     Pulmonary Assessment Scores:  Pulmonary Assessment Scores     Row Name 04/06/23 1338         ADL UCSD   ADL Phase Entry     SOB Score total 19       CAT Score   CAT Score 5       mMRC Score   mMRC Score 1             UCSD: Self-administered rating of dyspnea associated with activities of daily living (ADLs) 6-point scale (0 = "not at all" to 5 = "maximal or unable to do because of breathlessness")  Scoring Scores range from 0 to 120.  Minimally important difference is 5 units  CAT: CAT can identify the health impairment of COPD patients and is better correlated with disease progression.  CAT has a scoring range of zero to 40. The CAT score is classified into  four groups of low (less than 10), medium (10 - 20), high (21-30) and very high (31-40) based on the impact level of disease on health status. A CAT score over 10 suggests significant symptoms.  A worsening CAT score could be explained by an exacerbation, poor medication adherence, poor inhaler technique, or progression of COPD or comorbid conditions.  CAT MCID is 2 points  mMRC: mMRC (Modified Medical Research Council) Dyspnea Scale is used to assess the degree of baseline functional disability in patients of respiratory disease due to dyspnea. No minimal important difference is established. A decrease in score of 1 point or greater is considered a positive change.   Pulmonary Function Assessment:  Pulmonary Function Assessment - 04/06/23 1334       Breath   Bilateral Breath Sounds Decreased    Shortness of Breath Yes;Limiting activity             Exercise Target Goals: Exercise Program Goal: Individual exercise prescription set using results from initial 6 min walk test and THRR while considering  patient's activity barriers and safety.   Exercise Prescription Goal: Initial exercise prescription builds to 30-45 minutes a day of aerobic activity, 2-3 days per week.  Home exercise guidelines will be given to patient during program as part of exercise prescription that the participant will acknowledge.  Activity Barriers & Risk Stratification:  Activity Barriers & Cardiac Risk Stratification - 04/06/23 1332       Activity Barriers & Cardiac Risk Stratification   Activity Barriers Deconditioning;Muscular Weakness;Shortness of Breath             6 Minute Walk:  6 Minute Walk     Row Name 04/06/23 1401         6 Minute Walk   Phase Initial     Distance 1260 feet     Walk Time 6 minutes     # of Rest Breaks 0     MPH 2.39     METS 2.88     RPE 9     Perceived Dyspnea  1     VO2 Peak 10.08     Resting HR 61 bpm     Resting BP 126/66     Resting Oxygen Saturation   95 %     Exercise Oxygen Saturation  during 6 min walk 92 %     Max Ex. HR 86 bpm     Max Ex. BP 140/70     2 Minute  Post BP 120/66       Interval HR   1 Minute HR 80     2 Minute HR 86     3 Minute HR 84     4 Minute HR 86     5 Minute HR 84     6 Minute HR 85     2 Minute Post HR 65     Interval Heart Rate? Yes       Interval Oxygen   Interval Oxygen? Yes     Baseline Oxygen Saturation % 95 %     1 Minute Oxygen Saturation % 95 %     1 Minute Liters of Oxygen 0 L     2 Minute Oxygen Saturation % 94 %     2 Minute Liters of Oxygen 0 L     3 Minute Oxygen Saturation % 92 %     3 Minute Liters of Oxygen 0 L     4 Minute Oxygen Saturation % 93 %     4 Minute Liters of Oxygen 0 L     5 Minute Oxygen Saturation % 93 %     5 Minute Liters of Oxygen 0 L     6 Minute Oxygen Saturation % 94 %     6 Minute Liters of Oxygen 0 L     2 Minute Post Oxygen Saturation % 97 %     2 Minute Post Liters of Oxygen 0 L              Oxygen Initial Assessment:  Oxygen Initial Assessment - 04/06/23 1333       Home Oxygen   Home Oxygen Device None    Sleep Oxygen Prescription None    Home Exercise Oxygen Prescription None    Home Resting Oxygen Prescription None      Initial 6 min Walk   Oxygen Used None      Program Oxygen Prescription   Program Oxygen Prescription None      Intervention   Short Term Goals To learn and understand importance of maintaining oxygen saturations>88%;To learn and demonstrate proper use of respiratory medications;To learn and understand importance of monitoring SPO2 with pulse oximeter and demonstrate accurate use of the pulse oximeter.;To learn and demonstrate proper pursed lip breathing techniques or other breathing techniques.     Long  Term Goals Maintenance of O2 saturations>88%;Compliance with respiratory medication;Verbalizes importance of monitoring SPO2 with pulse oximeter and return demonstration;Exhibits proper breathing techniques, such as  pursed lip breathing or other method taught during program session;Demonstrates proper use of MDI's             Oxygen Re-Evaluation:  Oxygen Re-Evaluation     Row Name 04/14/23 0847 05/15/23 1203           Program Oxygen Prescription   Program Oxygen Prescription None None        Home Oxygen   Home Oxygen Device None None      Sleep Oxygen Prescription None None      Home Exercise Oxygen Prescription None None      Home Resting Oxygen Prescription None None        Goals/Expected Outcomes   Short Term Goals To learn and understand importance of maintaining oxygen saturations>88%;To learn and demonstrate proper use of respiratory medications;To learn and understand importance of monitoring SPO2 with pulse oximeter and demonstrate accurate use of the pulse oximeter.;To learn and demonstrate proper pursed lip breathing techniques or other breathing techniques.  To learn and understand importance of maintaining oxygen saturations>88%;To learn and demonstrate proper use of respiratory medications;To learn and understand importance of monitoring SPO2 with pulse oximeter and demonstrate accurate use of the pulse oximeter.;To learn and demonstrate proper pursed lip breathing techniques or other breathing techniques.       Long  Term Goals Maintenance of O2 saturations>88%;Compliance with respiratory medication;Verbalizes importance of monitoring SPO2 with pulse oximeter and return demonstration;Exhibits proper breathing techniques, such as pursed lip breathing or other method taught during program session;Demonstrates proper use of MDI's Maintenance of O2 saturations>88%;Compliance with respiratory medication;Verbalizes importance of monitoring SPO2 with pulse oximeter and return demonstration;Exhibits proper breathing techniques, such as pursed lip breathing or other method taught during program session;Demonstrates proper use of MDI's      Goals/Expected Outcomes Compliance and understanding of  oxygen saturation monitoring and breathing techniques to decrease shortness of breath. Compliance and understanding of oxygen saturation monitoring and breathing techniques to decrease shortness of breath.               Oxygen Discharge (Final Oxygen Re-Evaluation):  Oxygen Re-Evaluation - 05/15/23 1203       Program Oxygen Prescription   Program Oxygen Prescription None      Home Oxygen   Home Oxygen Device None    Sleep Oxygen Prescription None    Home Exercise Oxygen Prescription None    Home Resting Oxygen Prescription None      Goals/Expected Outcomes   Short Term Goals To learn and understand importance of maintaining oxygen saturations>88%;To learn and demonstrate proper use of respiratory medications;To learn and understand importance of monitoring SPO2 with pulse oximeter and demonstrate accurate use of the pulse oximeter.;To learn and demonstrate proper pursed lip breathing techniques or other breathing techniques.     Long  Term Goals Maintenance of O2 saturations>88%;Compliance with respiratory medication;Verbalizes importance of monitoring SPO2 with pulse oximeter and return demonstration;Exhibits proper breathing techniques, such as pursed lip breathing or other method taught during program session;Demonstrates proper use of MDI's    Goals/Expected Outcomes Compliance and understanding of oxygen saturation monitoring and breathing techniques to decrease shortness of breath.             Initial Exercise Prescription:  Initial Exercise Prescription - 04/06/23 1400       Date of Initial Exercise RX and Referring Provider   Date 04/06/23    Referring Provider Ramaswamy    Expected Discharge Date 07/02/23      Treadmill   MPH 1.5    Grade 0.5    Minutes 15    METs 2.26      Bike   Level 2    Minutes 15    METs 2      Prescription Details   Frequency (times per week) 2    Duration Progress to 30 minutes of continuous aerobic without signs/symptoms of  physical distress      Intensity   THRR 40-80% of Max Heartrate 58-116    Ratings of Perceived Exertion 11-13    Perceived Dyspnea 0-4      Progression   Progression Continue to progress workloads to maintain intensity without signs/symptoms of physical distress.      Resistance Training   Training Prescription Yes    Weight Blue Bands    Reps 10-15             Perform Capillary Blood Glucose checks as needed.  Exercise Prescription Changes:   Exercise Prescription Changes     Row  Name 04/21/23 1500 05/05/23 1500 05/14/23 1435         Response to Exercise   Blood Pressure (Admit) 130/56 124/54 112/62     Blood Pressure (Exercise) 140/70 142/60 --     Blood Pressure (Exit) 106/66 108/58 114/68     Heart Rate (Admit) 95 bpm 64 bpm 66 bpm     Heart Rate (Exercise) 110 bpm 98 bpm 105 bpm     Heart Rate (Exit) 84 bpm 78 bpm 84 bpm     Oxygen Saturation (Admit) 95 % 96 % 96 %     Oxygen Saturation (Exercise) 94 % 94 % 96 %     Oxygen Saturation (Exit) 94 % 97 % 94 %     Rating of Perceived Exertion (Exercise) 11 11 11      Perceived Dyspnea (Exercise) 0 0 0     Duration Continue with 30 min of aerobic exercise without signs/symptoms of physical distress. Continue with 30 min of aerobic exercise without signs/symptoms of physical distress. Continue with 30 min of aerobic exercise without signs/symptoms of physical distress.     Intensity THRR unchanged THRR unchanged THRR unchanged       Progression   Progression Continue to progress workloads to maintain intensity without signs/symptoms of physical distress. Continue to progress workloads to maintain intensity without signs/symptoms of physical distress. Continue to progress workloads to maintain intensity without signs/symptoms of physical distress.       Resistance Training   Training Prescription Yes Yes Yes     Weight Blue Bands Blue Bands Blue Bands     Reps 10-15 10-15 10-15     Time 10 Minutes 10 Minutes 10  Minutes       Interval Training   Interval Training -- -- No       Treadmill   MPH 2 2.4 2.5     Grade 0 1.5 2     Minutes 15 15 15      METs 2.53 3.33 3.4       Bike   Level 2 3 4      Minutes 15 15 15      METs 1.4 2 1.8              Exercise Comments:   Exercise Comments     Row Name 04/14/23 1455           Exercise Comments Pt completed first day of exercise. He exercised for 15 min on the treadmill and upright bike. He averaged 2.4 METs at 2 mph and 0 incline on the treadmill and 1.3 METs at level 2 on the upright bike. Joshua Reed performed the warmup and cooldown standing without limitations. Discussed METs.                Exercise Goals and Review:   Exercise Goals     Row Name 04/06/23 1333             Exercise Goals   Increase Physical Activity Yes       Intervention Provide advice, education, support and counseling about physical activity/exercise needs.;Develop an individualized exercise prescription for aerobic and resistive training based on initial evaluation findings, risk stratification, comorbidities and participant's personal goals.       Expected Outcomes Short Term: Attend rehab on a regular basis to increase amount of physical activity.;Long Term: Exercising regularly at least 3-5 days a week.;Long Term: Add in home exercise to make exercise part of routine and to increase amount of physical activity.  Increase Strength and Stamina Yes       Intervention Provide advice, education, support and counseling about physical activity/exercise needs.;Develop an individualized exercise prescription for aerobic and resistive training based on initial evaluation findings, risk stratification, comorbidities and participant's personal goals.       Expected Outcomes Short Term: Increase workloads from initial exercise prescription for resistance, speed, and METs.;Short Term: Perform resistance training exercises routinely during rehab and add in resistance  training at home;Long Term: Improve cardiorespiratory fitness, muscular endurance and strength as measured by increased METs and functional capacity ( )       Able to understand and use rate of perceived exertion (RPE) scale Yes       Intervention Provide education and explanation on how to use RPE scale       Expected Outcomes Short Term: Able to use RPE daily in rehab to express subjective intensity level;Long Term:  Able to use RPE to guide intensity level when exercising independently       Able to understand and use Dyspnea scale Yes       Intervention Provide education and explanation on how to use Dyspnea scale       Expected Outcomes Short Term: Able to use Dyspnea scale daily in rehab to express subjective sense of shortness of breath during exertion;Long Term: Able to use Dyspnea scale to guide intensity level when exercising independently       Knowledge and understanding of Target Heart Rate Range (THRR) Yes       Intervention Provide education and explanation of THRR including how the numbers were predicted and where they are located for reference       Expected Outcomes Short Term: Able to state/look up THRR;Short Term: Able to use daily as guideline for intensity in rehab;Long Term: Able to use THRR to govern intensity when exercising independently       Understanding of Exercise Prescription Yes       Intervention Provide education, explanation, and written materials on patient's individual exercise prescription       Expected Outcomes Short Term: Able to explain program exercise prescription;Long Term: Able to explain home exercise prescription to exercise independently                Exercise Goals Re-Evaluation :  Exercise Goals Re-Evaluation     Row Name 04/14/23 0846 05/15/23 1147           Exercise Goal Re-Evaluation   Exercise Goals Review Increase Physical Activity;Able to understand and use Dyspnea scale;Understanding of Exercise Prescription;Increase Strength  and Stamina;Knowledge and understanding of Target Heart Rate Range (THRR);Able to understand and use rate of perceived exertion (RPE) scale Increase Physical Activity;Able to understand and use Dyspnea scale;Understanding of Exercise Prescription;Increase Strength and Stamina;Knowledge and understanding of Target Heart Rate Range (THRR);Able to understand and use rate of perceived exertion (RPE) scale      Comments Joshua Reed is scheduled to begin group exercise today. Will progress as tolerated. Pt has completed 10 days of exercise. He is exercising for 15 min on the treadmill and upright bike. He averages 3.4 METs at 2.5 mph and 0 incline on the treadmill and 1.8 METs at level 4 on the upright bike. He has progressed on the treadmill but is not progressing on the bike. Joshua Reed performs the warmup and cooldown standing without limitations, uses black bands. Will progress as tolerated.      Expected Outcomes Through exercise at rehab and home, the patient will decrease shortness of breath  with daily activities and feel confident in carrying out an exercise regimen at home Through exercise at rehab and home, the patient will decrease shortness of breath with daily activities and feel confident in carrying out an exercise regimen at home               Discharge Exercise Prescription (Final Exercise Prescription Changes):  Exercise Prescription Changes - 05/14/23 1435       Response to Exercise   Blood Pressure (Admit) 112/62    Blood Pressure (Exit) 114/68    Heart Rate (Admit) 66 bpm    Heart Rate (Exercise) 105 bpm    Heart Rate (Exit) 84 bpm    Oxygen Saturation (Admit) 96 %    Oxygen Saturation (Exercise) 96 %    Oxygen Saturation (Exit) 94 %    Rating of Perceived Exertion (Exercise) 11    Perceived Dyspnea (Exercise) 0    Duration Continue with 30 min of aerobic exercise without signs/symptoms of physical distress.    Intensity THRR unchanged      Progression   Progression Continue to  progress workloads to maintain intensity without signs/symptoms of physical distress.      Resistance Training   Training Prescription Yes    Weight Blue Bands    Reps 10-15    Time 10 Minutes      Interval Training   Interval Training No      Treadmill   MPH 2.5    Grade 2    Minutes 15    METs 3.4      Bike   Level 4    Minutes 15    METs 1.8             Nutrition:  Target Goals: Understanding of nutrition guidelines, daily intake of sodium 1500mg , cholesterol 200mg , calories 30% from fat and 7% or less from saturated fats, daily to have 5 or more servings of fruits and vegetables.  Biometrics:  Pre Biometrics - 04/06/23 1320       Pre Biometrics   Grip Strength 35 kg              Nutrition Therapy Plan and Nutrition Goals:  Nutrition Therapy & Goals - 05/12/23 1353       Nutrition Therapy   Diet General Healthy diet      Personal Nutrition Goals   Nutrition Goal Patient to maintain diet quality by using the plate method as a guide for meal planning to include lean protein/plant protein, fruits, vegetables, whole grains, nonfat dairy as part of a well-balanced diet.   goal in progress.   Comments Goal in progress. Joshua Reed reports no nutrition questions/concerns at this time. He does have medical history of COPD, celiac disease, CAD, and hx of alcohol abuse. He quit smoking about two years ago. He reports following a gluten free diet and eating two meals daily. His wife does the majority of the grocery shopping and cooking. He reports current weight is his typical adult weight; he has maintained his weight since starting with our program. He was started on atorvastatin at 04/16/23 cardiology appointment. Patient will benefit from participation in pulmonary rehab for nutrition, exercise, and lifestyle modification.      Intervention Plan   Intervention Prescribe, educate and counsel regarding individualized specific dietary modifications aiming towards targeted  core components such as weight, hypertension, lipid management, diabetes, heart failure and other comorbidities.;Nutrition handout(s) given to patient.    Expected Outcomes Short Term Goal: Understand basic principles  of dietary content, such as calories, fat, sodium, cholesterol and nutrients.;Long Term Goal: Adherence to prescribed nutrition plan.             Nutrition Assessments:  Nutrition Assessments - 04/16/23 0946       Rate Your Plate Scores   Pre Score 50            MEDIFICTS Score Key: >=70 Need to make dietary changes  40-70 Heart Healthy Diet <= 40 Therapeutic Level Cholesterol Diet  Flowsheet Row PULMONARY REHAB CHRONIC OBSTRUCTIVE PULMONARY DISEASE from 04/14/2023 in [facility] & [facility]  Picture Your Plate Total Score on Admission 50      Picture Your Plate Scores: <95 Unhealthy dietary pattern with much room for improvement. 41-50 Dietary pattern unlikely to meet recommendations for good health and room for improvement. 51-60 More healthful dietary pattern, with some room for improvement.  >60 Healthy dietary pattern, although there may be some specific behaviors that could be improved.    Nutrition Goals Re-Evaluation:  Nutrition Goals Re-Evaluation     Row Name 04/14/23 1418 05/12/23 1353           Goals   Current Weight 139 lb 5.3 oz (63.2 kg) 138 lb 3.7 oz (62.7 kg)      Comment PCP labs not available through [employer]- completed through [facility]. no new labs; most recent labs not available as PCP is with [facility] and not available through MyChart.      Expected Outcome Joshua Reed reports no nutrition questions/concerns at this time. He does have medical history of COPD, celiac disease, CAD and hx of alcohol abuse. He quit smoking about two years ago. He reports following a gluten free diet and eating two meals daily. His wife does the majority of the grocery shopping and cooking. He reports current weight is  his typical adult weight. Patient will benefit from participation in pulmonary rehab for nutrition, exercise, and lifestyle modification. Goal in progress. Joshua Reed reports no nutrition questions/concerns at this time. He does have medical history of COPD, celiac disease, CAD, and hx of alcohol abuse. He quit smoking about two years ago. He reports following a gluten free diet and eating two meals daily. His wife does the majority of the grocery shopping and cooking. He reports current weight is his typical adult weight; he has maintained his weight since starting with our program. He was started on atorvastatin at 04/16/23 cardiology appointment. Patient will benefit from participation in pulmonary rehab for nutrition, exercise, and lifestyle modification.               Nutrition Goals Discharge (Final Nutrition Goals Re-Evaluation):  Nutrition Goals Re-Evaluation - 05/12/23 1353       Goals   Current Weight 138 lb 3.7 oz (62.7 kg)    Comment no new labs; most recent labs not available as PCP is with [facility] and not available through MyChart.    Expected Outcome Goal in progress. Joshua Reed reports no nutrition questions/concerns at this time. He does have medical history of COPD, celiac disease, CAD, and hx of alcohol abuse. He quit smoking about two years ago. He reports following a gluten free diet and eating two meals daily. His wife does the majority of the grocery shopping and cooking. He reports current weight is his typical adult weight; he has maintained his weight since starting with our program. He was started on atorvastatin at 04/16/23 cardiology appointment. Patient will benefit from participation in pulmonary rehab  for nutrition, exercise, and lifestyle modification.             Psychosocial: Target Goals: Acknowledge presence or absence of significant depression and/or stress, maximize coping skills, provide positive support system. Participant is able to verbalize types and ability to  use techniques and skills needed for reducing stress and depression.  Initial Review & Psychosocial Screening:  Initial Psych Review & Screening - 04/06/23 1339       Initial Review   Current issues with None Identified      Family Dynamics   Good Support System? Yes    Concerns --   None   Comments Has good support from his wife      Barriers   Psychosocial barriers to participate in program There are no identifiable barriers or psychosocial needs.      Screening Interventions   Interventions Encouraged to exercise    Expected Outcomes Long Term Goal: Stressors or current issues are controlled or eliminated.             Quality of Life Scores:  Scores of 19 and below usually indicate a poorer quality of life in these areas.  A difference of  2-3 points is a clinically meaningful difference.  A difference of 2-3 points in the total score of the Quality of Life Index has been associated with significant improvement in overall quality of life, self-image, physical symptoms, and general health in studies assessing change in quality of life.  PHQ-9: Review Flowsheet       04/06/2023  Depression screen PHQ 2/9  Decreased Interest 0  Down, Depressed, Hopeless 0  PHQ - 2 Score 0  Altered sleeping 0  Tired, decreased energy 0  Change in appetite 0  Feeling bad or failure about yourself  0  Trouble concentrating 0  Moving slowly or fidgety/restless 0  Suicidal thoughts 0  PHQ-9 Score 0    Details           Interpretation of Total Score  Total Score Depression Severity:  1-4 = Minimal depression, 5-9 = Mild depression, 10-14 = Moderate depression, 15-19 = Moderately severe depression, 20-27 = Severe depression   Psychosocial Evaluation and Intervention:  Psychosocial Evaluation - 04/06/23 1340       Psychosocial Evaluation & Interventions   Interventions Encouraged to exercise with the program and follow exercise prescription    Comments Joshua Reed states he is  discouraged about the things he once did. He wants to get back to being as active as he can be without dyspnea    Expected Outcomes For Joshua Reed to improve his shortness of breath in everyday acitvities while in the Pulmonary Rehab Program.    Continue Psychosocial Services  No Follow up required             Psychosocial Re-Evaluation:  Psychosocial Re-Evaluation     Row Name 04/15/23 1119 05/15/23 1335           Psychosocial Re-Evaluation   Current issues with None Identified None Identified      Comments No changes since orientation. Joshua Reed has completed 1 session so far. At the time of reevaluation Joshua Reed still denies any psychosocial barriers or concerns. He denies any needs at this time.      Expected Outcomes For Joshua Reed to attend PR free of any psychosocial barriers or concerns For Joshua Reed to continue to attend PR free of any psychosocial barriers or concerns      Interventions Encouraged to attend Pulmonary Rehabilitation for the  exercise Encouraged to attend Pulmonary Rehabilitation for the exercise      Continue Psychosocial Services  No Follow up required No Follow up required               Psychosocial Discharge (Final Psychosocial Re-Evaluation):  Psychosocial Re-Evaluation - 05/15/23 1335       Psychosocial Re-Evaluation   Current issues with None Identified    Comments At the time of reevaluation Joshua Reed still denies any psychosocial barriers or concerns. He denies any needs at this time.    Expected Outcomes For Joshua Reed to continue to attend PR free of any psychosocial barriers or concerns    Interventions Encouraged to attend Pulmonary Rehabilitation for the exercise    Continue Psychosocial Services  No Follow up required             Education: Education Goals: Education classes will be provided on a weekly basis, covering required topics. Participant will state understanding/return demonstration of topics presented.  Learning Barriers/Preferences:  Learning  Barriers/Preferences - 04/06/23 1350       Learning Barriers/Preferences   Learning Barriers Sight    Learning Preferences Group Instruction;Individual Instruction;Skilled Demonstration;Verbal Instruction;Written Material             Education Topics: Know Your Numbers Group instruction that is supported by a PowerPoint presentation. Instructor discusses importance of knowing and understanding resting, exercise, and post-exercise oxygen saturation, heart rate, and blood pressure. Oxygen saturation, heart rate, blood pressure, rating of perceived exertion, and dyspnea are reviewed along with a normal range for these values.  Flowsheet Row PULMONARY REHAB CHRONIC OBSTRUCTIVE PULMONARY DISEASE from 04/23/2023 in [facility]  Date 04/23/23  Educator EP  Instruction Review Code 1- Verbalizes Understanding       Exercise for the Pulmonary Patient Group instruction that is supported by a PowerPoint presentation. Instructor discusses benefits of exercise, core components of exercise, frequency, duration, and intensity of an exercise routine, importance of utilizing pulse oximetry during exercise, safety while exercising, and options of places to exercise outside of rehab.  Flowsheet Row PULMONARY REHAB CHRONIC OBSTRUCTIVE PULMONARY DISEASE from 04/16/2023 in [facility]  Date 04/16/23  Educator EP  Instruction Review Code 1- Verbalizes Understanding       MET Level  Group instruction provided by PowerPoint, verbal discussion, and written material to support subject matter. Instructor reviews what METs are and how to increase METs.    Pulmonary Medications Verbally interactive group education provided by instructor with focus on inhaled medications and proper administration.   Anatomy and Physiology of the Respiratory System Group instruction provided by PowerPoint,  verbal discussion, and written material to support subject matter. Instructor reviews respiratory cycle and anatomical components of the respiratory system and their functions. Instructor also reviews differences in obstructive and restrictive respiratory diseases with examples of each.    Oxygen Safety Group instruction provided by PowerPoint, verbal discussion, and written material to support subject matter. There is an overview of "What is Oxygen" and "Why do we need it".  Instructor also reviews how to create a safe environment for oxygen use, the importance of using oxygen as prescribed, and the risks of noncompliance. There is a brief discussion on traveling with oxygen and resources the patient may utilize. Flowsheet Row PULMONARY REHAB CHRONIC OBSTRUCTIVE PULMONARY DISEASE from 04/30/2023 in [facility]  Date 04/30/23  Educator  RN  Instruction Review Code 1- Verbalizes Understanding       Oxygen Use Group instruction provided by PowerPoint, verbal discussion, and written material to discuss how supplemental oxygen is prescribed and different types of oxygen supply systems. Resources for more information are provided.  Flowsheet Row PULMONARY REHAB CHRONIC OBSTRUCTIVE PULMONARY DISEASE from 05/07/2023 in [facility]  Date 05/07/23  Educator RT  Instruction Review Code 1- Verbalizes Understanding       Breathing Techniques Group instruction that is supported by demonstration and informational handouts. Instructor discusses the benefits of pursed lip and diaphragmatic breathing and detailed demonstration on how to perform both.  Flowsheet Row PULMONARY REHAB CHRONIC OBSTRUCTIVE PULMONARY DISEASE from 05/14/2023 in [facility]  Date 05/14/23  Educator RN  Instruction Review Code 1- Verbalizes Understanding         Risk Factor Reduction Group instruction that is supported by a PowerPoint presentation. Instructor discusses the definition of a risk factor, different risk factors for pulmonary disease, and how the heart and lungs work together.   Pulmonary Diseases Group instruction provided by PowerPoint, verbal discussion, and written material to support subject matter. Instructor gives an overview of the different type of pulmonary diseases. There is also a discussion on risk factors and symptoms as well as ways to manage the diseases.   Stress and Energy Conservation Group instruction provided by PowerPoint, verbal discussion, and written material to support subject matter. Instructor gives an overview of stress and the impact it can have on the body. Instructor also reviews ways to reduce stress. There is also a discussion on energy conservation and ways to conserve energy throughout the day.   Warning Signs and Symptoms Group instruction provided by PowerPoint, verbal discussion, and written material to support subject matter. Instructor reviews warning signs and symptoms of stroke, heart attack, cold and flu. Instructor also reviews ways to prevent the spread of infection.   Other Education Group or individual verbal, written, or video instructions that support the educational goals of the pulmonary rehab program.    Knowledge Questionnaire Score:  Knowledge Questionnaire Score - 04/06/23 1422       Knowledge Questionnaire Score   Pre Score 6/18             Core Components/Risk Factors/Patient Goals at Admission:  Personal Goals and Risk Factors at Admission - 04/06/23 1342       Core Components/Risk Factors/Patient Goals on Admission    Weight Management Weight Maintenance    Improve shortness of breath with ADL's Yes    Intervention Provide education, individualized exercise plan and daily activity instruction to help decrease symptoms of SOB with activities of daily living.     Expected Outcomes Short Term: Improve cardiorespiratory fitness to achieve a reduction of symptoms when performing ADLs;Long Term: Be able to perform more ADLs without symptoms or delay the onset of symptoms    Increase knowledge of respiratory medications and ability to use respiratory devices properly  Yes    Intervention Provide education and demonstration as needed of appropriate use of medications, inhalers, and oxygen therapy.    Expected Outcomes Short Term: Achieves understanding of medications use. Understands that oxygen is a medication prescribed by physician. Demonstrates appropriate use of inhaler and oxygen therapy.;Long Term: Maintain appropriate use of medications, inhalers, and oxygen therapy.             Core Components/Risk Factors/Patient  Goals Review:   Goals and Risk Factor Review     Row Name 04/15/23 1121 05/15/23 1336           Core Components/Risk Factors/Patient Goals Review   Personal Goals Review Weight Management/Obesity;Improve shortness of breath with ADL's;Develop more efficient breathing techniques such as purse lipped breathing and diaphragmatic breathing and practicing self-pacing with activity.;Increase knowledge of respiratory medications and ability to use respiratory devices properly. Weight Management/Obesity;Improve shortness of breath with ADL's;Develop more efficient breathing techniques such as purse lipped breathing and diaphragmatic breathing and practicing self-pacing with activity.;Increase knowledge of respiratory medications and ability to use respiratory devices properly.      Review Unable to assess goals. Joshua Reed has completed 1 session. Goal in action for weight maintenance. Joshua Reed is currently maintaining his weight since starting the program. He declines the need to speak to our dietician at this time. Goal progressing for decreasing his shortness of breath with ADLs. Joshua Reed's oxygen has maintained >88% on room air with exertion. He has  increased both his workload and METs while maintaining his oxygen saturations. Goal progressing on developing more efficient breathing techniques such as purse lipped breathing and diaphragmatic breathing; and practicing self-pacing with activity. Joshua Reed needs to be reminded to initiate PLB without staff interference while exercising. He knows how to listen to his body and pace himself when he becomes short of breath. Goal met for increasing his knowledge of respiratory medications and the ability to use respiratory devices properly. He has correctly demonstrated and voiced when to use his medications with our respiratory therapist.   Joshua Reed will continue to benefit from participation in PR for nutrition, education, exercise, and lifestyle modification.      Expected Outcomes For Joshua Reed to maintain his weight, improve his shortness of breath with ADLs, develop more efficient breathing techniques, learn how to self-pace, and increase his knowledge of respiratory medications. For Joshua Reed to maintain his weight, improve his shortness of breath with ADLs, develop more efficient breathing techniques, learn how to self-pace, and increase his knowledge of respiratory medications.               Core Components/Risk Factors/Patient Goals at Discharge (Final Review):   Goals and Risk Factor Review - 05/15/23 1336       Core Components/Risk Factors/Patient Goals Review   Personal Goals Review Weight Management/Obesity;Improve shortness of breath with ADL's;Develop more efficient breathing techniques such as purse lipped breathing and diaphragmatic breathing and practicing self-pacing with activity.;Increase knowledge of respiratory medications and ability to use respiratory devices properly.    Review Goal in action for weight maintenance. Joshua Reed is currently maintaining his weight since starting the program. He declines the need to speak to our dietician at this time. Goal progressing for decreasing his shortness of  breath with ADLs. Joshua Reed's oxygen has maintained >88% on room air with exertion. He has increased both his workload and METs while maintaining his oxygen saturations. Goal progressing on developing more efficient breathing techniques such as purse lipped breathing and diaphragmatic breathing; and practicing self-pacing with activity. Joshua Reed needs to be reminded to initiate PLB without staff interference while exercising. He knows how to listen to his body and pace himself when he becomes short of breath. Goal met for increasing his knowledge of respiratory medications and the ability to use respiratory devices properly. He has correctly demonstrated and voiced when to use his medications with our respiratory therapist.   Joshua Reed will continue to benefit from participation in PR for nutrition, education, exercise, and  lifestyle modification.    Expected Outcomes For Joshua Reed to maintain his weight, improve his shortness of breath with ADLs, develop more efficient breathing techniques, learn how to self-pace, and increase his knowledge of respiratory medications.             ITP Comments:  Comments: Pt is making expected progress toward Pulmonary Rehab goals after completing 10 sessions. Recommend continued exercise, life style modification, education, and utilization of breathing techniques to increase stamina and strength, while also decreasing shortness of breath with exertion.  Dr. Mechele Collin is Medical Director for Pulmonary Rehab at [facility].

## 2023-05-21 ENCOUNTER — Encounter (HOSPITAL_COMMUNITY): Payer: No Typology Code available for payment source

## 2023-05-22 NOTE — Progress Notes (Signed)
Discharge Progress Report  Patient Details  Name: Joshua Reed MRN: [id number] Date of Birth: 10-31-1945 Referring Provider:   Doristine Devoid Pulmonary Rehab Walk Test from 04/06/2023 in [facility]  Referring Provider Ramaswamy        Number of Visits: 10  Reason for Discharge:  Early Exit:  Personal  Smoking History:  Social History   Tobacco Use  Smoking Status Former   Current packs/day: 0.00   Types: Cigarettes   Quit date: 08/06/2021   Years since quitting: 1.7  Smokeless Tobacco Never    Diagnosis:  Stage 3 severe COPD by GOLD classification (HCC)  ADL UCSD:  Pulmonary Assessment Scores     Row Name 04/06/23 1338         ADL UCSD   ADL Phase Entry     SOB Score total 19       CAT Score   CAT Score 5       mMRC Score   mMRC Score 1              Initial Exercise Prescription:  Initial Exercise Prescription - 04/06/23 1400       Date of Initial Exercise RX and Referring Provider   Date 04/06/23    Referring Provider Ramaswamy    Expected Discharge Date 07/02/23      Treadmill   MPH 1.5    Grade 0.5    Minutes 15    METs 2.26      Bike   Level 2    Minutes 15    METs 2      Prescription Details   Frequency (times per week) 2    Duration Progress to 30 minutes of continuous aerobic without signs/symptoms of physical distress      Intensity   THRR 40-80% of Max Heartrate 58-116    Ratings of Perceived Exertion 11-13    Perceived Dyspnea 0-4      Progression   Progression Continue to progress workloads to maintain intensity without signs/symptoms of physical distress.      Resistance Training   Training Prescription Yes    Weight Blue Bands    Reps 10-15             Discharge Exercise Prescription (Final Exercise Prescription Changes):  Exercise Prescription Changes - 05/14/23 1435       Response to Exercise   Blood Pressure (Admit) 112/62    Blood  Pressure (Exit) 114/68    Heart Rate (Admit) 66 bpm    Heart Rate (Exercise) 105 bpm    Heart Rate (Exit) 84 bpm    Oxygen Saturation (Admit) 96 %    Oxygen Saturation (Exercise) 96 %    Oxygen Saturation (Exit) 94 %    Rating of Perceived Exertion (Exercise) 11    Perceived Dyspnea (Exercise) 0    Duration Continue with 30 min of aerobic exercise without signs/symptoms of physical distress.    Intensity THRR unchanged      Progression   Progression Continue to progress workloads to maintain intensity without signs/symptoms of physical distress.      Resistance Training   Training Prescription Yes    Weight Blue Bands    Reps 10-15    Time 10 Minutes      Interval Training   Interval Training No      Treadmill   MPH 2.5    Grade 2    Minutes 15  METs 3.4      Bike   Level 4    Minutes 15    METs 1.8             Functional Capacity:  6 Minute Walk     Row Name 04/06/23 1401         6 Minute Walk   Phase Initial     Distance 1260 feet     Walk Time 6 minutes     # of Rest Breaks 0     MPH 2.39     METS 2.88     RPE 9     Perceived Dyspnea  1     VO2 Peak 10.08     Resting HR 61 bpm     Resting BP 126/66     Resting Oxygen Saturation  95 %     Exercise Oxygen Saturation  during 6 min walk 92 %     Max Ex. HR 86 bpm     Max Ex. BP 140/70     2 Minute Post BP 120/66       Interval HR   1 Minute HR 80     2 Minute HR 86     3 Minute HR 84     4 Minute HR 86     5 Minute HR 84     6 Minute HR 85     2 Minute Post HR 65     Interval Heart Rate? Yes       Interval Oxygen   Interval Oxygen? Yes     Baseline Oxygen Saturation % 95 %     1 Minute Oxygen Saturation % 95 %     1 Minute Liters of Oxygen 0 L     2 Minute Oxygen Saturation % 94 %     2 Minute Liters of Oxygen 0 L     3 Minute Oxygen Saturation % 92 %     3 Minute Liters of Oxygen 0 L     4 Minute Oxygen Saturation % 93 %     4 Minute Liters of Oxygen 0 L     5 Minute Oxygen  Saturation % 93 %     5 Minute Liters of Oxygen 0 L     6 Minute Oxygen Saturation % 94 %     6 Minute Liters of Oxygen 0 L     2 Minute Post Oxygen Saturation % 97 %     2 Minute Post Liters of Oxygen 0 L              Psychological, QOL, Others - Outcomes: PHQ 2/9:    04/06/2023    1:39 PM  Depression screen PHQ 2/9  Decreased Interest 0  Down, Depressed, Hopeless 0  PHQ - 2 Score 0  Altered sleeping 0  Tired, decreased energy 0  Change in appetite 0  Feeling bad or failure about yourself  0  Trouble concentrating 0  Moving slowly or fidgety/restless 0  Suicidal thoughts 0  PHQ-9 Score 0    Quality of Life:   Personal Goals: Goals established at orientation with interventions provided to work toward goal.  Personal Goals and Risk Factors at Admission - 04/06/23 1342       Core Components/Risk Factors/Patient Goals on Admission    Weight Management Weight Maintenance    Improve shortness of breath with ADL's Yes    Intervention Provide education, individualized exercise plan and daily activity instruction  to help decrease symptoms of SOB with activities of daily living.    Expected Outcomes Short Term: Improve cardiorespiratory fitness to achieve a reduction of symptoms when performing ADLs;Long Term: Be able to perform more ADLs without symptoms or delay the onset of symptoms    Increase knowledge of respiratory medications and ability to use respiratory devices properly  Yes    Intervention Provide education and demonstration as needed of appropriate use of medications, inhalers, and oxygen therapy.    Expected Outcomes Short Term: Achieves understanding of medications use. Understands that oxygen is a medication prescribed by physician. Demonstrates appropriate use of inhaler and oxygen therapy.;Long Term: Maintain appropriate use of medications, inhalers, and oxygen therapy.              Personal Goals Discharge:  Goals and Risk Factor Review     Row Name  04/15/23 1121 05/15/23 1336 05/22/23 1055         Core Components/Risk Factors/Patient Goals Review   Personal Goals Review Weight Management/Obesity;Improve shortness of breath with ADL's;Develop more efficient breathing techniques such as purse lipped breathing and diaphragmatic breathing and practicing self-pacing with activity.;Increase knowledge of respiratory medications and ability to use respiratory devices properly. Weight Management/Obesity;Improve shortness of breath with ADL's;Develop more efficient breathing techniques such as purse lipped breathing and diaphragmatic breathing and practicing self-pacing with activity.;Increase knowledge of respiratory medications and ability to use respiratory devices properly. Weight Management/Obesity;Improve shortness of breath with ADL's;Develop more efficient breathing techniques such as purse lipped breathing and diaphragmatic breathing and practicing self-pacing with activity.     Review Unable to assess goals. Joshua Reed has completed 1 session. Goal in action for weight maintenance. Joshua Reed is currently maintaining his weight since starting the program. He declines the need to speak to our dietician at this time. Goal progressing for decreasing his shortness of breath with ADLs. Joshua Reed's oxygen has maintained >88% on room air with exertion. He has increased both his workload and METs while maintaining his oxygen saturations. Goal progressing on developing more efficient breathing techniques such as purse lipped breathing and diaphragmatic breathing; and practicing self-pacing with activity. Joshua Reed needs to be reminded to initiate PLB without staff interference while exercising. He knows how to listen to his body and pace himself when he becomes short of breath. Goal met for increasing his knowledge of respiratory medications and the ability to use respiratory devices properly. He has correctly demonstrated and voiced when to use his medications with our  respiratory therapist.   Joshua Reed will continue to benefit from participation in PR for nutrition, education, exercise, and lifestyle modification. Joshua Reed has decided to discontinue the program. His last day of exercise was 05/14/23, he completed 10 sessions. Core Components/Risk Factors/Patient Goals Review re-evaluation at time of discharge are as follows: Goal met for weight maintenance. Joshua Reed maintained his weight for the 5 weeks he was in the program. He denies any dietary support at time of discharge. Goal not met for decreasing his shortness of breath with ADLs. Joshua Reed's oxygen has maintained >88% on room air with exertion. He has increased both his workload and METs while maintaining his oxygen saturations. Joshua Reed states even though he has made progress and has maintained his oxygen saturate with exertion he is still short of breath while performing ADLs. Goal not met on developing more efficient breathing techniques such as purse lipped breathing and diaphragmatic breathing; and practicing self-pacing with activity. Joshua Reed needed to be reminded to initiate PLB while exercising. He did meet the goal on how  to listen to his body and pace himself when he becomes short of breath. We wish Joshua Reed the best of luck post pulmonary rehab.     Expected Outcomes For Joshua Reed to maintain his weight, improve his shortness of breath with ADLs, develop more efficient breathing techniques, learn how to self-pace, and increase his knowledge of respiratory medications. For Joshua Reed to maintain his weight, improve his shortness of breath with ADLs, develop more efficient breathing techniques, learn how to self-pace, and increase his knowledge of respiratory medications. For Joshua Reed to maintain his weight, improve his shortness of breath with ADLs, develop more efficient breathing techniques and continuing to self-pace post PR              Exercise Goals and Review:  Exercise Goals     Row Name 04/06/23 1333              Exercise Goals   Increase Physical Activity Yes       Intervention Provide advice, education, support and counseling about physical activity/exercise needs.;Develop an individualized exercise prescription for aerobic and resistive training based on initial evaluation findings, risk stratification, comorbidities and participant's personal goals.       Expected Outcomes Short Term: Attend rehab on a regular basis to increase amount of physical activity.;Long Term: Exercising regularly at least 3-5 days a week.;Long Term: Add in home exercise to make exercise part of routine and to increase amount of physical activity.       Increase Strength and Stamina Yes       Intervention Provide advice, education, support and counseling about physical activity/exercise needs.;Develop an individualized exercise prescription for aerobic and resistive training based on initial evaluation findings, risk stratification, comorbidities and participant's personal goals.       Expected Outcomes Short Term: Increase workloads from initial exercise prescription for resistance, speed, and METs.;Short Term: Perform resistance training exercises routinely during rehab and add in resistance training at home;Long Term: Improve cardiorespiratory fitness, muscular endurance and strength as measured by increased METs and functional capacity ( )       Able to understand and use rate of perceived exertion (RPE) scale Yes       Intervention Provide education and explanation on how to use RPE scale       Expected Outcomes Short Term: Able to use RPE daily in rehab to express subjective intensity level;Long Term:  Able to use RPE to guide intensity level when exercising independently       Able to understand and use Dyspnea scale Yes       Intervention Provide education and explanation on how to use Dyspnea scale       Expected Outcomes Short Term: Able to use Dyspnea scale daily in rehab to express subjective sense of shortness of breath  during exertion;Long Term: Able to use Dyspnea scale to guide intensity level when exercising independently       Knowledge and understanding of Target Heart Rate Range (THRR) Yes       Intervention Provide education and explanation of THRR including how the numbers were predicted and where they are located for reference       Expected Outcomes Short Term: Able to state/look up THRR;Short Term: Able to use daily as guideline for intensity in rehab;Long Term: Able to use THRR to govern intensity when exercising independently       Understanding of Exercise Prescription Yes       Intervention Provide education, explanation, and written materials on patient's individual exercise  prescription       Expected Outcomes Short Term: Able to explain program exercise prescription;Long Term: Able to explain home exercise prescription to exercise independently                Exercise Goals Re-Evaluation:  Exercise Goals Re-Evaluation     Row Name 04/14/23 0846 05/15/23 1147 05/21/23 1526         Exercise Goal Re-Evaluation   Exercise Goals Review Increase Physical Activity;Able to understand and use Dyspnea scale;Understanding of Exercise Prescription;Increase Strength and Stamina;Knowledge and understanding of Target Heart Rate Range (THRR);Able to understand and use rate of perceived exertion (RPE) scale Increase Physical Activity;Able to understand and use Dyspnea scale;Understanding of Exercise Prescription;Increase Strength and Stamina;Knowledge and understanding of Target Heart Rate Range (THRR);Able to understand and use rate of perceived exertion (RPE) scale Increase Physical Activity;Able to understand and use Dyspnea scale;Understanding of Exercise Prescription;Increase Strength and Stamina;Knowledge and understanding of Target Heart Rate Range (THRR);Able to understand and use rate of perceived exertion (RPE) scale     Comments Joshua Reed is scheduled to begin group exercise today. Will progress as  tolerated. Pt has completed 10 days of exercise. He is exercising for 15 min on the treadmill and upright bike. He averages 3.4 METs at 2.5 mph and 0 incline on the treadmill and 1.8 METs at level 4 on the upright bike. He has progressed on the treadmill but is not progressing on the bike. Joshua Reed performs the warmup and cooldown standing without limitations, uses black bands. Will progress as tolerated. Joshua Reed has completed 10 exercise sessions. His peak METs were 3.4 on the treadmill and 1.8 on the upright bike. Joshua Reed does not feel he has benefited from the program and would like to be discharged.     Expected Outcomes Through exercise at rehab and home, the patient will decrease shortness of breath with daily activities and feel confident in carrying out an exercise regimen at home Through exercise at rehab and home, the patient will decrease shortness of breath with daily activities and feel confident in carrying out an exercise regimen at home Through exercise at rehab and home, the patient will decrease shortness of breath with daily activities and feel confident in carrying out an exercise regimen at home              Nutrition & Weight - Outcomes:  Pre Biometrics - 04/06/23 1320       Pre Biometrics   Grip Strength 35 kg              Nutrition:  Nutrition Therapy & Goals - 05/12/23 1353       Nutrition Therapy   Diet General Healthy diet      Personal Nutrition Goals   Nutrition Goal Patient to maintain diet quality by using the plate method as a guide for meal planning to include lean protein/plant protein, fruits, vegetables, whole grains, nonfat dairy as part of a well-balanced diet.   goal in progress.   Comments Goal in progress. Joshua Reed reports no nutrition questions/concerns at this time. He does have medical history of COPD, celiac disease, CAD, and hx of alcohol abuse. He quit smoking about two years ago. He reports following a gluten free diet and eating two meals daily.  His wife does the majority of the grocery shopping and cooking. He reports current weight is his typical adult weight; he has maintained his weight since starting with our program. He was started on atorvastatin at 04/16/23 cardiology appointment.  Patient will benefit from participation in pulmonary rehab for nutrition, exercise, and lifestyle modification.      Intervention Plan   Intervention Prescribe, educate and counsel regarding individualized specific dietary modifications aiming towards targeted core components such as weight, hypertension, lipid management, diabetes, heart failure and other comorbidities.;Nutrition handout(s) given to patient.    Expected Outcomes Short Term Goal: Understand basic principles of dietary content, such as calories, fat, sodium, cholesterol and nutrients.;Long Term Goal: Adherence to prescribed nutrition plan.             Nutrition Discharge:  Nutrition Assessments - 04/16/23 0946       Rate Your Plate Scores   Pre Score 50             Education Questionnaire Score:  Knowledge Questionnaire Score - 04/06/23 1422       Knowledge Questionnaire Score   Pre Score 6/18            Joshua Reed decided to discontinue the program due to a recent illness and feeling it wasn't helping his shortness of breath. His last day in class was 10/24. Joshua Reed completed 10 sessions. At the time of discharge Joshua Reed denied any psy/soc barriers, concerns, or needs.   Core Components/Risk Factors/Patient Goals Review re-evaluation at time of discharge are as follows: Goal met for weight maintenance. Joshua Reed maintained his weight for the 5 weeks he was in the program. He denies any dietary support at time of discharge. Goal not met for decreasing his shortness of breath with ADLs. Joshua Reed's oxygen has maintained >88% on room air with exertion. He has increased both his workload and METs while maintaining his oxygen saturations. Joshua Reed states even though he has made progress and  has maintained his oxygen saturate with exertion he is still short of breath while performing ADLs. Goal not met on developing more efficient breathing techniques such as purse lipped breathing and diaphragmatic breathing; and practicing self-pacing with activity. Joshua Reed needed to be reminded to initiate PLB while exercising. He did meet the goal on how to listen to his body and pace himself when he becomes short of breath. We wish Joshua Reed the best of luck post pulmonary rehab.

## 2023-05-26 ENCOUNTER — Encounter (HOSPITAL_COMMUNITY): Payer: No Typology Code available for payment source

## 2023-05-28 ENCOUNTER — Encounter (HOSPITAL_COMMUNITY): Payer: No Typology Code available for payment source

## 2023-06-02 ENCOUNTER — Encounter (HOSPITAL_COMMUNITY): Payer: No Typology Code available for payment source

## 2023-06-04 ENCOUNTER — Encounter (HOSPITAL_COMMUNITY): Payer: No Typology Code available for payment source

## 2023-06-09 ENCOUNTER — Encounter (HOSPITAL_COMMUNITY): Payer: No Typology Code available for payment source

## 2023-06-11 ENCOUNTER — Encounter (HOSPITAL_COMMUNITY): Payer: No Typology Code available for payment source

## 2023-06-12 ENCOUNTER — Other Ambulatory Visit: Payer: No Typology Code available for payment source

## 2023-06-12 ENCOUNTER — Ambulatory Visit
Admission: RE | Admit: 2023-06-12 | Discharge: 2023-06-12 | Disposition: A | Discharge status: Home / Self Care | Payer: No Typology Code available for payment source | Source: Ambulatory Visit | Attending: Acute Care | Admitting: Acute Care

## 2023-06-12 DIAGNOSIS — Z87891 Personal history of nicotine dependence: Secondary | ICD-10-CM | POA: Diagnosis not present

## 2023-06-12 DIAGNOSIS — Z122 Encounter for screening for malignant neoplasm of respiratory organs: Secondary | ICD-10-CM

## 2023-06-16 ENCOUNTER — Encounter (HOSPITAL_COMMUNITY): Payer: No Typology Code available for payment source

## 2023-06-23 ENCOUNTER — Encounter (HOSPITAL_COMMUNITY): Payer: No Typology Code available for payment source

## 2023-06-25 ENCOUNTER — Encounter (HOSPITAL_COMMUNITY): Payer: No Typology Code available for payment source

## 2023-06-30 ENCOUNTER — Encounter (HOSPITAL_COMMUNITY): Payer: No Typology Code available for payment source

## 2023-06-30 NOTE — Patient Instructions (Signed)
Below is our plan:  We will continue levetiracetam 1000mg  twice daily.   Please make sure you are consistent with timing of seizure medication. I recommend annual visit with primary care provider (PCP) for complete physical and routine blood work. I recommend daily intake of vitamin D (400-800iu) and calcium (800-1000mg ) for bone health. Discuss Dexa screening with PCP.   According to  law, you can not drive unless you are seizure / syncope free for at least 6 months and under physician's care.  Please maintain precautions. Do not participate in activities where a loss of awareness could harm you or someone else. No swimming alone, no tub bathing, no hot tubs, no driving, no operating motorized vehicles (cars, ATVs, motocycles, etc), lawnmowers, power tools or firearms. No standing at heights, such as rooftops, ladders or stairs. Avoid hot objects such as stoves, heaters, open fires. Wear a helmet when riding a bicycle, scooter, skateboard, etc. and avoid areas of traffic. Set your water heater to 120 degrees or less.  SUDEP is the sudden, unexpected death of someone with epilepsy, who was otherwise healthy. In SUDEP cases, no other cause of death is found when an autopsy is done. Each year, more than 1 in 1,000 people with epilepsy die from SUDEP. This is the leading cause of death in people with uncontrolled seizures. Until further answers are available, the best way to prevent SUDEP is to lower your risk by controlling seizures. Research has found that people with all types of epilepsy that experience convulsive seizures can be at risk.  Please make sure you are staying well hydrated. I recommend 50-60 ounces daily. Well balanced diet and regular exercise encouraged. Consistent sleep schedule with 6-8 hours recommended.   Please continue follow up with care team as directed.   Follow up with me in 1 year   You may receive a survey regarding today's visit. I encourage you to leave honest feed  back as I do use this information to improve patient care. Thank you for seeing me today!
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
## 2023-07-01 ENCOUNTER — Encounter: Payer: Self-pay | Admitting: Family Medicine

## 2023-07-01 ENCOUNTER — Ambulatory Visit (INDEPENDENT_AMBULATORY_CARE_PROVIDER_SITE_OTHER): Payer: No Typology Code available for payment source | Admitting: Family Medicine

## 2023-07-01 VITALS — BP 112/72 | HR 70 | Ht 67.0 in | Wt 138.0 lb

## 2023-07-01 DIAGNOSIS — G40909 Epilepsy, unspecified, not intractable, without status epilepticus: Secondary | ICD-10-CM | POA: Diagnosis not present

## 2023-07-01 MED ORDER — LEVETIRACETAM 1000 MG PO TABS: 1000.0000 mg | ORAL_TABLET | Freq: Two times a day (BID) | ORAL | 3 refills | Status: DC

## 2023-07-01 NOTE — Progress Notes (Signed)
Gave completed/signed DMV ppw back to medical records to process for pt.

## 2023-07-02 ENCOUNTER — Encounter (HOSPITAL_COMMUNITY): Payer: No Typology Code available for payment source

## 2023-07-06 ENCOUNTER — Other Ambulatory Visit: Payer: Self-pay

## 2023-07-06 DIAGNOSIS — Z87898 Personal history of other specified conditions: Secondary | ICD-10-CM | POA: Diagnosis not present

## 2023-07-06 DIAGNOSIS — Z122 Encounter for screening for malignant neoplasm of respiratory organs: Secondary | ICD-10-CM

## 2023-07-06 DIAGNOSIS — Z87891 Personal history of nicotine dependence: Secondary | ICD-10-CM

## 2023-07-08 ENCOUNTER — Telehealth: Payer: Self-pay | Admitting: Internal Medicine

## 2023-07-08 NOTE — Telephone Encounter (Signed)
Called and spoke to patient.  He is requesting to switch back to trelegy. He does not feel that Markus Daft is effective.   Dr. Celine Mans, please advise. Thanks

## 2023-07-11 MED ORDER — TRELEGY ELLIPTA 100-62.5-25 MCG/ACT IN AEPB: 1.0000 | INHALATION_SPRAY | Freq: Every day | RESPIRATORY_TRACT | 3 refills | Status: DC

## 2023-07-11 NOTE — Telephone Encounter (Signed)
Called patient but he did not answer. Medication has sent to pharmacy.

## 2023-08-20 DIAGNOSIS — Z03818 Encounter for observation for suspected exposure to other biological agents ruled out: Secondary | ICD-10-CM | POA: Diagnosis not present

## 2023-08-20 DIAGNOSIS — G4709 Other insomnia: Secondary | ICD-10-CM | POA: Diagnosis not present

## 2023-08-20 DIAGNOSIS — J101 Influenza due to other identified influenza virus with other respiratory manifestations: Secondary | ICD-10-CM | POA: Diagnosis not present

## 2023-08-20 DIAGNOSIS — R051 Acute cough: Secondary | ICD-10-CM | POA: Diagnosis not present

## 2023-09-09 ENCOUNTER — Ambulatory Visit: Payer: No Typology Code available for payment source | Admitting: Family Medicine

## 2023-09-22 ENCOUNTER — Telehealth: Payer: Self-pay | Admitting: Pulmonary Disease

## 2023-09-22 ENCOUNTER — Telehealth: Payer: Self-pay | Admitting: Nurse Practitioner

## 2023-09-22 MED ORDER — ALBUTEROL SULFATE HFA 108 (90 BASE) MCG/ACT IN AERS: 2.0000 | INHALATION_SPRAY | Freq: Four times a day (QID) | RESPIRATORY_TRACT | 6 refills | Status: AC | PRN

## 2023-09-22 MED ORDER — ALBUTEROL SULFATE (2.5 MG/3ML) 0.083% IN NEBU: 2.5000 mg | INHALATION_SOLUTION | Freq: Four times a day (QID) | RESPIRATORY_TRACT | 12 refills | Status: DC | PRN

## 2023-09-22 NOTE — Telephone Encounter (Signed)
 PT needs Albuterol NEBULIZER refill.(Out Now)   Pharm is: [facility] on [address]   They have  tried for awhile to get in touch with [place] to also refill Albuterol Inhaler.   Also, can the Dr. file an appeal with his insurance to have them get Trellegy . Wife states the alternatives don't work. She says he was on an inhaler that starts with a B.

## 2023-09-22 NOTE — Telephone Encounter (Signed)
 Called and spoke with patient's wife (DPR), she stated that he needed a refill of he albuterol nebulizer solution and the inhaler.  He went to the pharmacy and paid $600 out of pocket for the Trelegy, this part of their deductible they have to pay before it covers medications.  Advised I would send in the refills and to let us know if they needed anything further.  Nothing further needed.

## 2023-09-23 NOTE — Telephone Encounter (Signed)
 Agree with advice

## 2023-09-24 NOTE — Telephone Encounter (Signed)
 Atc x1. Lmtcb. Nfn, I was calling to inform pt that Dr Marchelle Gearing agrees to Dr Yvetta Coder note and to just continue with that advice.

## 2023-09-30 ENCOUNTER — Ambulatory Visit (HOSPITAL_BASED_OUTPATIENT_CLINIC_OR_DEPARTMENT_OTHER): Payer: Self-pay | Admitting: Internal Medicine

## 2023-09-30 ENCOUNTER — Telehealth: Payer: Self-pay | Admitting: Internal Medicine

## 2023-09-30 ENCOUNTER — Telehealth: Payer: Self-pay | Admitting: Pulmonary Disease

## 2023-09-30 DIAGNOSIS — J441 Chronic obstructive pulmonary disease with (acute) exacerbation: Secondary | ICD-10-CM | POA: Diagnosis not present

## 2023-09-30 DIAGNOSIS — Z6821 Body mass index (BMI) 21.0-21.9, adult: Secondary | ICD-10-CM | POA: Diagnosis not present

## 2023-09-30 MED ORDER — PREDNISONE 10 MG PO TABS: ORAL_TABLET | ORAL | 0 refills | Status: AC

## 2023-09-30 NOTE — Telephone Encounter (Signed)
 Routing to front staff. Please advise.

## 2023-09-30 NOTE — Telephone Encounter (Signed)
   Best advice is to go to the ER but if they do not want to go he can try Please take prednisone 40 mg x1 day, then 30 mg x1 day, then 20 mg x1 day, then 10 mg x1 day, and then 5 mg x1 day and stop   If he is interested in this please to send the prescription.  I am busy in clinic.  Again if something bad happens to him because of taking the prednisone then I am not liable because the primary advice is to go to the ER

## 2023-09-30 NOTE — Telephone Encounter (Addendum)
 Call from [employer] office, spoke to [place], pt wife is on phone. Speaking to pt wife.  TRIAGE SUMMARY NOTE: Pt wife is poor historian. Pt wife reporting that pt is having much more SOB than normal, also having abdominal pain that pt has stated is "killing him," both SOB and abdominal pain come and go but using albuterol inhaler much more than normal and abdominal pain may be relieved in certain positions. Pt wife reporting that PA mentioned how his stomach can swell up with air and cause pain, thinks related to his COPD. Pt wife requesting prednisone with no antibx - please advise. Advised that pt go to hospital for examination asap, given the severity of his symptoms. Pt wife reporting that pt will not be willing to go, advised strongly pt be examined asap. Connected to front desk at [employer], informed of ED refusal, no availability this week. Informed wife, wife requesting appt next week or to be on cancellation list for appt this week - please advise. Advised hospital especially if worsening, if refusing hospital, advised have option to try calling PCP or go to UC, advised hospital for more immediate care and stabilization, advised that nurse sending HP message to office with requests. Please call pt with further recommendations as appropriate.  E2C2 Pulmonary Triage - Initial Assessment Questions "Chief Complaint (e.g., cough, sob, wheezing, fever, chills, sweat or additional symptoms) *Go to specific symptom protocol after initial questions. Know what it's like when he has flare-ups not sure because comes and goes but for last week and a half, even called middle of night earlier in week, lungs are not normal, somehow affecting his stomach, NP said that could happen with air getting in his stomach, he'll go and not eat all day, has had scopes to rule out anything, it is related to his COPD, got the trelegy just doesn't seem to be helping, 2-3 weeks ago was doing good and really didn't take the  trelegy and really wasn't gonna go get it because it was gonna be $600 but started having trouble so went and got it Constant during the day, and night does pretty good, maybe need to go on prednisone without antibx again Abdominal pain for years, but when get stressed this maybe adds to it because cannot breathe that well like his stomach swells up with air, not a little bit but last night said his stomach was killing him but got better after sat in certain position, don't think changes in BM, abdominal pain in the upper part, it'll swell Changes in the home with daughter and grandsons and dogs moved in recently  "How long have symptoms been present?" Last week and a half  Have you tested for COVID or Flu? Note: If not, ask patient if a home test can be taken. If so, instruct patient to call back for positive results. No  MEDICINES:   "Have you used any OTC meds to help with symptoms?" No  "Have you used your inhalers/maintenance medication?" Yes If yes, "What medications?" Trelegy med once daily, albuterol inhaler  If inhaler, ask "How many puffs and how often?" Note: Review instructions on medication in the chart. Not sure, been using a lot more than normal  OXYGEN: "Do you wear supplemental oxygen?" No wife thinking about oxygen therapy for him  Advised ED based on info available from wife Doesn't want to go to hospital, wants appt with pulm Not gonna go unless it's so severe Asking for recommendations and appt for next  week  Reason for Disposition  [1] MODERATE difficulty breathing (e.g., speaks in phrases, SOB even at rest, pulse 100-120) AND [2] NEW-onset or WORSE than normal  Answer Assessment - Initial Assessment Questions 1. RESPIRATORY STATUS: "Describe your breathing?" (e.g., wheezing, shortness of breath, unable to speak, severe coughing)      Struggling, no wheezing 3. PATTERN "Does the difficult breathing come and go, or has it been constant since it started?"       Comes and goes 4. SEVERITY: "How bad is your breathing?" (e.g., mild, moderate, severe)    - MILD: No SOB at rest, mild SOB with walking, speaks normally in sentences, can lie down, no retractions, pulse < 100.    - MODERATE: SOB at rest, SOB with minimal exertion and prefers to sit, cannot lie down flat, speaks in phrases, mild retractions, audible wheezing, pulse 100-120.    - SEVERE: Very SOB at rest, speaks in single words, struggling to breathe, sitting hunched forward, retractions, pulse > 120      Pain in stomach now killing him sometimes then sit certain and that will help him some tied into COPD Between moderate and severe, having to sit hunched forward to help with his stomach, not struggling to get words out He'll say "just having so much trouble breathing then some time where I'm okay but just been struggling," say that every day 5. RECURRENT SYMPTOM: "Have you had difficulty breathing before?" If Yes, ask: "When was the last time?" and "What happened that time?"      yes 6. CARDIAC HISTORY: "Do you have any history of heart disease?" (e.g., heart attack, angina, bypass surgery, angioplasty)      Significant hx 7. LUNG HISTORY: "Do you have any history of lung disease?"  (e.g., pulmonary embolus, asthma, emphysema)     Severe COPD 9. OTHER SYMPTOMS: "Do you have any other symptoms? (e.g., dizziness, runny nose, cough, chest pain, fever)     Don't think chest pain, no trouble standing or walking, runny nose, or dizziness, don't think fever  Protocols used: Breathing Difficulty-A-AH

## 2023-09-30 NOTE — Telephone Encounter (Signed)
 Per last tel encounter LM for PT to call for appt. Per Triage nurse refused to go to ED. No appts open at time of call.

## 2023-09-30 NOTE — Telephone Encounter (Signed)
 Spoke with patient's wife  susan regarding prior message . Advised susan that MR's recommendation's . Darl Pikes said patient would be ok with the prednisone taper [facility]. Please take prednisone 40 mg x1 day, then 30 mg x1 day, then 20 mg x1 day, then 10 mg x1 day, and then 5 mg x1 day and stop   Patient was on his way to see his PCP to be checked out .  Susan's voice was understanding. Nothing else further needed.

## 2023-09-30 NOTE — Addendum Note (Signed)
 Addended by: Lanna Poche on: 09/30/2023 04:12 PM   Modules accepted: Orders

## 2023-10-01 ENCOUNTER — Telehealth: Payer: Self-pay | Admitting: Internal Medicine

## 2023-10-01 NOTE — Telephone Encounter (Signed)
 Darl Pikes wife would like to discuss the Prednisone. Darl Pikes phone number is [phone].

## 2023-10-01 NOTE — Telephone Encounter (Signed)
 Answering service:  Rx Presni

## 2023-10-02 NOTE — Telephone Encounter (Signed)
 Spoke with the pt's spouse  She states that Dr Tenny Craw had sent rx for pred bc she thought the pharmacy did not receive our rx  They did receive our rx though, and she is asking if okay to take as prescribed by MR  I advised to take pred taper as directed by MR  Nothing further needed

## 2023-10-05 NOTE — Telephone Encounter (Signed)
 I thought I called in the prednisone but if he is still unwell he needs a urgent visit or go to the ER

## 2023-10-15 NOTE — Telephone Encounter (Signed)
 NFN

## 2023-10-26 ENCOUNTER — Ambulatory Visit: Payer: Self-pay | Admitting: Internal Medicine

## 2023-10-26 NOTE — Telephone Encounter (Signed)
 Dr.Dewald patient is coming to see you on 10/27/2023 at 10:00am   Just FYI

## 2023-10-26 NOTE — Telephone Encounter (Signed)
 Copied from CRM [id number]. Topic: Clinical - Red Word Triage >> Oct 26, 2023  2:22 PM Renie Ora wrote: Red Word that prompted transfer to Nurse Triage: Patient wife stated he has been having trouble breathing and Saturday he had a really bad day where air was getting in his stomach and swelling.   Chief Complaint: Shortness of Breath worsening Symptoms: shortness of breath worse on exertion Frequency: x a week but worse the past two days Pertinent Negatives: Patient denies fever, nausea, vomiting, diarrhea, coughing up blood Disposition: [] ED /[] Urgent Care (no appt availability in office) / [x] Appointment(In office/virtual)/ []  [facility]/ [] Home Care/ [] Refused Recommended Disposition /[]  Mobile Bus/ []  Follow-up with PCP Additional Notes: Patient's wife called and advised that the patient has been having trouble breathing for the past week and Saturday (2 days ago) that his abdomen was swollen and "filled with air" per the wife. She states that today is better than yesterday. Patient was prescribed Prednisone recently  Patient was advised to go to the Emergency Room but the patient would not do that at that time and this was about 3 weeks ago.    Patient also saw his gastroenterologist to rule out any abdominal issues. She states that his abdomen doesn't seem swollen today and he has not complained about it any today. Patient's wife states that today his breathing difficulty seems to be between mild and moderate.  He was able to go out to [employer] at this time. Wife, Darl Pikes, states that the patient is calling her from [employer] for something that she wanted him to pick up.  This RN told her that I would find out about an appointment and someone would get back in touch with her. This RN called the CAL at the patient's Pulmonary Office in [place] and advised them of the situation.  This RN advised them that the patient's disposition within the protocol states for him to be  seen in the next 4 hours but since he is walking around [employer] at this time, if he is not having difficulty breathing, would they be okay with him having a later appointment than that.  They set him an appointment for tomorrow 10/27/2023 at 10am with Dr Francine Graven. This RN advised the CAL that I would call the patient's wife and let her know this appointment time. Wife states that her husband told her on the phone at this time that he is doing good at this time as far as his breathing but they would still like to set up this appointment to get him checked out due to this change in the past week to have him further evaluated. She is also advised at this time that if anything worsens, to take the patient immediately to the Emergency Room or call 911 for an ambulance to take him to the Emergency Room if needed.  Patient's wife verbalized understanding.      Reason for Disposition  [1] MILD difficulty breathing (e.g., minimal/no SOB at rest, SOB with walking, pulse <100) AND [2] NEW-onset or WORSE than normal  Answer Assessment - Initial Assessment Questions E2C2 Pulmonary Triage - Initial Assessment Questions "Chief Complaint (e.g., cough, sob, wheezing, fever, chills, sweat or additional symptoms) *Go to specific symptom protocol after initial questions. Patient is not with the caller (his wife) since he just went to [employer].  "How long have symptoms been present?" X a week but worse the past two days  Have you tested for COVID or  Flu? Note: If not, ask patient if a home test can be taken. If so, instruct patient to call back for positive results. No  MEDICINES:   "Have you used any OTC meds to help with symptoms?" No If yes, ask "What medications?" N/A  "Have you used your inhalers/maintenance medication?" Yes If yes, "What medications?" Albuterol nebulizer Albuterol  Trelegy  If inhaler, ask "How many puffs and how often?" Note: Review instructions on medication in the  chart. Albuterol nebulizer ---only used once they are unsure if they have all the parts to it---they are advised to bring it with them so we can check out the parts Albuterol Rescue inhaler 2 puffs every six hours Trelegy 1 puff daily  OXYGEN: "Do you wear supplemental oxygen?" No If yes, "How many liters are you supposed to use?"    N/A  "Do you monitor your oxygen levels?" No If yes, "What is your reading (oxygen level) today?" N/a  "What is your usual oxygen saturation reading?"  (Note: Pulmonary O2 sats should be 90% or greater) N/a   1. RESPIRATORY STATUS: "Describe your breathing?" (e.g., wheezing, shortness of breath, unable to speak, severe coughing)      Short of breath worse on exertion  2. ONSET: "When did this breathing problem begin?"      A week ago but worse the past two days---better today per wife 3. PATTERN "Does the difficult breathing come and go, or has it been constant since it started?"      Shortness of breath worse with exertion 4. SEVERITY: "How bad is your breathing?" (e.g., mild, moderate, severe)    - MILD: No SOB at rest, mild SOB with walking, speaks normally in sentences, can lie down, no retractions, pulse < 100.    - MODERATE: SOB at rest, SOB with minimal exertion and prefers to sit, cannot lie down flat, speaks in phrases, mild retractions, audible wheezing, pulse 100-120.    - SEVERE: Very SOB at rest, speaks in single words, struggling to breathe, sitting hunched forward, retractions, pulse > 120      Between Mild and Moderate 5. RECURRENT SYMPTOM: "Have you had difficulty breathing before?" If Yes, ask: "When was the last time?" and "What happened that time?"      N/a 6. CARDIAC HISTORY: "Do you have any history of heart disease?" (e.g., heart attack, angina, bypass surgery, angioplasty)      No 7. LUNG HISTORY: "Do you have any history of lung disease?"  (e.g., pulmonary embolus, asthma, emphysema)     Severe COPD 8. CAUSE: "What do you think  is causing the breathing problem?"      COPD 9. OTHER SYMPTOMS: "Do you have any other symptoms? (e.g., dizziness, runny nose, cough, chest pain, fever)     Wife denies 10. O2 SATURATION MONITOR:  "Do you use an oxygen saturation monitor (pulse oximeter) at home?" If Yes, ask: "What is your reading (oxygen level) today?" "What is your usual oxygen saturation reading?" (e.g., 95%)       N/a 12. TRAVEL: "Have you traveled out of the country in the last month?" (e.g., travel history, exposures)       N/a  Protocols used: Breathing Difficulty-A-AH

## 2023-10-27 ENCOUNTER — Ambulatory Visit: Admitting: Pulmonary Disease

## 2023-10-27 ENCOUNTER — Encounter: Payer: Self-pay | Admitting: Pulmonary Disease

## 2023-10-27 VITALS — BP 133/79 | HR 63 | Ht 68.0 in | Wt 134.0 lb

## 2023-10-27 DIAGNOSIS — J441 Chronic obstructive pulmonary disease with (acute) exacerbation: Secondary | ICD-10-CM

## 2023-10-27 DIAGNOSIS — J302 Other seasonal allergic rhinitis: Secondary | ICD-10-CM

## 2023-10-27 MED ORDER — ALBUTEROL SULFATE (2.5 MG/3ML) 0.083% IN NEBU
2.5000 mg | INHALATION_SOLUTION | RESPIRATORY_TRACT | 12 refills | Status: AC | PRN
Start: 2023-10-27 — End: ?

## 2023-10-27 MED ORDER — TRELEGY ELLIPTA 200-62.5-25 MCG/ACT IN AEPB
1.0000 | INHALATION_SPRAY | Freq: Every day | RESPIRATORY_TRACT | 11 refills | Status: AC
Start: 2023-10-27 — End: ?

## 2023-10-27 MED ORDER — TRELEGY ELLIPTA 200-62.5-25 MCG/ACT IN AEPB: INHALATION_SPRAY | RESPIRATORY_TRACT | Status: DC

## 2023-10-27 MED ORDER — MONTELUKAST SODIUM 10 MG PO TABS
10.0000 mg | ORAL_TABLET | Freq: Every day | ORAL | 5 refills | Status: AC
Start: 2023-10-27 — End: ?

## 2023-10-27 NOTE — Patient Instructions (Addendum)
 We will increase your trelegy dose  Use trelegy 200 1 puff daily - rinse mouth out after each use  Use albuterol inhaler 1-2 puffs or albuterol nebulizer solution every 4-6 hours as needed  Resume montelukast 10mg  daily for allergy related breathing issues  We will get you scheduled for an overnight oxygen tests  Follow up as scheduled with Dr. Marchelle Gearing

## 2023-10-27 NOTE — Progress Notes (Signed)
 Synopsis: Hx of COPD  Subjective:   PATIENT ID: Joshua Reed GENDER: male DOB: 1945/09/30, MRN: [id number]  HPI  Chief Complaint  Patient presents with   Acute Visit    Pt states SOB x1 week     Joshua Reed is a 78 year old male, former smoker with COPD who returns to pulmonary clinic.   He is experiencing difficulty breathing, which he attributes to his COPD. He uses Trelegy once daily and albuterol two to three times a day. Trelegy is effective, but he is concerned about its increased cost. He has used Clinical cytogeneticist in the past but prefers Teacher, music. A recent course of prednisone improved his breathing, although he did not follow the tapering instructions as written. His breathing improved during the prednisone course but has worsened since. He experiences fluctuations in his breathing, with good and bad days, and notes that physical exertion exacerbates his symptoms. No increased mucus production or current cough is noted, but significant shortness of breath occurs with physical exertion.  He describes a recent incident where he had to open windows due to a broken air conditioning system, which he believes led to increased pollen exposure and worsened his symptoms. A groundhog damaged his home's ductwork, necessitating the opening of windows. He associates increased pollen exposure with a recent exacerbation of symptoms, including wheezing.  He inquires about the need for oxygen therapy, noting that his oxygen saturation was measured at 94%. He recalls a previous attempt to arrange an overnight oxygen test, which was not completed due to difficulties with the service provider. He is not currently taking Singulair or any other prescription allergy medication. He also mentions having a nebulizer but is unsure about its use and frequency.  Past Medical History:  Diagnosis Date   Alcohol abuse    COPD (chronic obstructive pulmonary disease) (HCC)    Hyperlipidemia    Low vitamin B12 level     Macular degeneration    Seizure disorder (HCC) 2205   ALCOHOL RELATED   Seizures (HCC)    Vitamin D deficiency      Family History  Problem Relation Age of Onset   Heart attack Mother 79   Rheumatic fever Mother    Alzheimer's disease Father    Colon cancer Sister    Heart Problems Brother        open heart surgery   Asthma Child      Social History   Socioeconomic History   Marital status: Married    Spouse name: Darl Pikes   Number of children: 2   Years of education: Not on file   Highest education level: 10th grade  Occupational History    Employer: OTHER    Comment: n/a  Tobacco Use   Smoking status: Former    Current packs/day: 0.00    Types: Cigarettes    Quit date: 08/06/2021    Years since quitting: 2.2   Smokeless tobacco: Never  Vaping Use   Vaping status: Never Used  Substance and Sexual Activity   Alcohol use: Not Currently    Alcohol/week: 4.0 standard drinks of alcohol    Types: 4 Cans of beer per week    Comment: quit 2-3  years ago   Drug use: No   Sexual activity: Not on file  Other Topics Concern   Not on file  Social History Narrative   Patient lives at home with spouse.   Caffeine use: a few cups of coffee in the morning  Watches TV ([employer]) and movies, goes to [employer]      Retired from Associate Professor   Social Drivers of Corporate investment banker Strain: Not on [employer] Insecurity: Not on file  Transportation Needs: Not on file  Physical Activity: Not on file  Stress: Not on file  Social Connections: Not on file  Intimate Partner Violence: Not on file     Allergies  Allergen Reactions   Acyclovir And Related Hives     Outpatient Medications Prior to Visit  Medication Sig Dispense Refill   albuterol (PROVENTIL) (2.5 MG/3ML) 0.083% nebulizer solution Take 3 mLs (2.5 mg total) by nebulization every 6 (six) hours as needed for wheezing or shortness of breath. 75 mL 12   albuterol (VENTOLIN HFA) 108 (90 Base) MCG/ACT  inhaler Inhale 2 puffs into the lungs every 6 (six) hours as needed for wheezing or shortness of breath. 18 g 6   aspirin EC 81 MG tablet Take 1 tablet (81 mg total) by mouth daily. Swallow whole. 90 tablet 3   Cholecalciferol 50 MCG (2000 UT) CAPS Take 10,000 Units by mouth daily at 6 (six) AM.     Coenzyme Q10 (CO Q 10 PO) Take 200 mg by mouth daily.     Cyanocobalamin (B-12 IJ) Takes injection as directed bi weekly     fluticasone (FLONASE) 50 MCG/ACT nasal spray Place 2 sprays into both nostrils daily. 18.2 mL 2   Fluticasone-Umeclidin-Vilant (TRELEGY ELLIPTA) 100-62.5-25 MCG/ACT AEPB Inhale 1 puff into the lungs daily. 60 each 3   levETIRAcetam (KEPPRA) 1000 MG tablet Take 1 tablet (1,000 mg total) by mouth 2 (two) times daily. 180 tablet 3   MAGNESIUM GLYCINATE PO Take by mouth.     montelukast (SINGULAIR) 10 MG tablet Take 1 tablet (10 mg total) by mouth at bedtime. 30 tablet 5   Multiple Vitamin (MULTIVITAMIN WITH MINERALS) TABS tablet Take 1 tablet by mouth daily.     Omega-3 Fatty Acids (FISH OIL PO) Take 1 capsule by mouth daily.     predniSONE (DELTASONE) 10 MG tablet Take 4 tabs for 2 days, then 3 tabs for 2 days, 2 tabs for 2 days, then 1 tab for 2 days, then stop. 20 tablet 0   atorvastatin (LIPITOR) 10 MG tablet Take 1 tablet (10 mg total) by mouth daily. 90 tablet 3   ezetimibe (ZETIA) 10 MG tablet Take 1 tablet (10 mg total) by mouth daily. 30 tablet 6   No facility-administered medications prior to visit.    Review of Systems  Constitutional:  Negative for chills, fever, malaise/fatigue and weight loss.  HENT:  Negative for congestion, sinus pain and sore throat.   Eyes: Negative.   Respiratory:  Positive for cough, shortness of breath and wheezing. Negative for hemoptysis and sputum production.   Cardiovascular:  Negative for chest pain, palpitations, orthopnea, claudication and leg swelling.  Gastrointestinal:  Negative for abdominal pain, heartburn, nausea and vomiting.   Genitourinary: Negative.   Musculoskeletal:  Negative for joint pain and myalgias.  Skin:  Negative for rash.  Neurological:  Negative for weakness.  Endo/Heme/Allergies: Negative.   Psychiatric/Behavioral: Negative.       Objective:   Vitals:   10/27/23 1003  BP: 133/79  Pulse: 63  SpO2: 94%  Weight: 134 lb (60.8 kg)  Height: 5\' 8"  (1.727 m)     Physical Exam Constitutional:      General: He is not in acute distress.    Appearance: Normal appearance.  Eyes:     General: No scleral icterus.    Conjunctiva/sclera: Conjunctivae normal.  Cardiovascular:     Rate and Rhythm: Normal rate and regular rhythm.  Pulmonary:     Breath sounds: Wheezing (mild, scattered) present. No rhonchi or rales.  Musculoskeletal:     Right lower leg: No edema.     Left lower leg: No edema.  Skin:    General: Skin is warm and dry.  Neurological:     General: No focal deficit present.     CBC    Component Value Date/Time   WBC 7.0 10/23/2022 1036   RBC 4.62 10/23/2022 1036   HGB 14.2 10/23/2022 1036   HCT 42.7 10/23/2022 1036   PLT 261.0 10/23/2022 1036   MCV 92.4 10/23/2022 1036   MCH 30.2 07/03/2014 0511   MCHC 33.2 10/23/2022 1036   RDW 13.8 10/23/2022 1036   LYMPHSABS 1.9 10/23/2022 1036   MONOABS 0.7 10/23/2022 1036   EOSABS 0.7 10/23/2022 1036   BASOSABS 0.0 10/23/2022 1036      Latest Ref Rng & Units 12/16/2022    7:14 PM 07/03/2014    5:11 AM 07/02/2014    5:40 AM  BMP  Glucose 70 - 99 mg/dL  80  130   BUN 6 - 23 mg/dL  7  4   Creatinine 8.65 - 1.24 mg/dL 7.84  6.96  2.95   Sodium 137 - 147 mEq/L  134  132   Potassium 3.7 - 5.3 mEq/L  4.2  4.4   Chloride 96 - 112 mEq/L  100  102   CO2 19 - 32 mEq/L  25  20   Calcium 8.4 - 10.5 mg/dL  7.8  7.6    Chest imaging: CT Chest 06/12/23 1. Lung-RADS 2, benign appearance or behavior. Continue annual screening with low-dose chest CT without contrast in 12 months. 2. Aortic atherosclerosis (ICD10-I70.0). Coronary  artery calcification. 3.  Emphysema (ICD10-J43.9).  PFT:    Latest Ref Rng & Units 01/05/2023    8:01 AM  PFT Results  FVC-Pre L 2.05   FVC-Predicted Pre % 52   FVC-Post L 2.58   FVC-Predicted Post % 66   Pre FEV1/FVC % % 37   Post FEV1/FCV % % 41   FEV1-Pre L 0.76   FEV1-Predicted Pre % 27   FEV1-Post L 1.06   DLCO uncorrected ml/min/mmHg 9.14   DLCO UNC% % 38   DLCO corrected ml/min/mmHg 9.14   DLCO COR %Predicted % 38   DLVA Predicted % 53   TLC L 7.81   TLC % Predicted % 117   RV % Predicted % 228     Labs:  Path:  Echo 2024: LV EF 60-65%. Grade I diastolic dysfunction. RV systolic function is normal and RV size is normal.   Heart Catheterization:    Assessment & Plan:   COPD with acute exacerbation (HCC) - Plan: Home sleep test  Discussion: Joshua Reed is a 78 year old male, former smoker with COPD who returns to pulmonary clinic.   Chronic Obstructive Pulmonary Disease (COPD) Increased wheezing and dyspnea likely exacerbated by environmental factors. Trelegy effective but costly. Albuterol used frequently. Previous good response to prednisone but hesitant due to side effects. Elevated eosinophils suggest allergy-related inflammation. Discussed potential need for oxygen therapy during exertion and at night. Oxygen saturation adequate at rest and with exertion. - Start Trelegy 200 1 puff daily - Provide sample of higher dose Trelegy. - Prescribe montelukast (Singulair) at bedtime. -  Instruct on albuterol inhaler or nebulizer every 4-6 hours as needed. - He does not qualify for supplemental oxygen at this time.  Follow-up Advised to follow up with primary care physician, Dr. Marchelle Gearing, as scheduled or contact pulmonary clinic if symptoms do not improve. - Follow up with Dr. Marchelle Gearing as scheduled. - Contact pulmonary clinic if symptoms do not improve.  Melody Comas, MD [facility] Office: [phone]   Current Outpatient  Medications:    albuterol (PROVENTIL) (2.5 MG/3ML) 0.083% nebulizer solution, Take 3 mLs (2.5 mg total) by nebulization every 6 (six) hours as needed for wheezing or shortness of breath., Disp: 75 mL, Rfl: 12   albuterol (VENTOLIN HFA) 108 (90 Base) MCG/ACT inhaler, Inhale 2 puffs into the lungs every 6 (six) hours as needed for wheezing or shortness of breath., Disp: 18 g, Rfl: 6   aspirin EC 81 MG tablet, Take 1 tablet (81 mg total) by mouth daily. Swallow whole., Disp: 90 tablet, Rfl: 3   Cholecalciferol 50 MCG (2000 UT) CAPS, Take 10,000 Units by mouth daily at 6 (six) AM., Disp: , Rfl:    Coenzyme Q10 (CO Q 10 PO), Take 200 mg by mouth daily., Disp: , Rfl:    Cyanocobalamin (B-12 IJ), Takes injection as directed bi weekly, Disp: , Rfl:    fluticasone (FLONASE) 50 MCG/ACT nasal spray, Place 2 sprays into both nostrils daily., Disp: 18.2 mL, Rfl: 2   Fluticasone-Umeclidin-Vilant (TRELEGY ELLIPTA) 100-62.5-25 MCG/ACT AEPB, Inhale 1 puff into the lungs daily., Disp: 60 each, Rfl: 3   levETIRAcetam (KEPPRA) 1000 MG tablet, Take 1 tablet (1,000 mg total) by mouth 2 (two) times daily., Disp: 180 tablet, Rfl: 3   MAGNESIUM GLYCINATE PO, Take by mouth., Disp: , Rfl:    montelukast (SINGULAIR) 10 MG tablet, Take 1 tablet (10 mg total) by mouth at bedtime., Disp: 30 tablet, Rfl: 5   Multiple Vitamin (MULTIVITAMIN WITH MINERALS) TABS tablet, Take 1 tablet by mouth daily., Disp: , Rfl:    Omega-3 Fatty Acids (FISH OIL PO), Take 1 capsule by mouth daily., Disp: , Rfl:    atorvastatin (LIPITOR) 10 MG tablet, Take 1 tablet (10 mg total) by mouth daily., Disp: 90 tablet, Rfl: 3   ezetimibe (ZETIA) 10 MG tablet, Take 1 tablet (10 mg total) by mouth daily., Disp: 30 tablet, Rfl: 6

## 2023-11-09 ENCOUNTER — Encounter

## 2023-11-19 DIAGNOSIS — D510 Vitamin B12 deficiency anemia due to intrinsic factor deficiency: Secondary | ICD-10-CM | POA: Diagnosis not present

## 2023-11-19 DIAGNOSIS — E7800 Pure hypercholesterolemia, unspecified: Secondary | ICD-10-CM | POA: Diagnosis not present

## 2023-11-19 DIAGNOSIS — Z125 Encounter for screening for malignant neoplasm of prostate: Secondary | ICD-10-CM | POA: Diagnosis not present

## 2023-11-19 DIAGNOSIS — Z79899 Other long term (current) drug therapy: Secondary | ICD-10-CM | POA: Diagnosis not present

## 2023-11-19 DIAGNOSIS — E559 Vitamin D deficiency, unspecified: Secondary | ICD-10-CM | POA: Diagnosis not present

## 2023-11-19 DIAGNOSIS — E538 Deficiency of other specified B group vitamins: Secondary | ICD-10-CM | POA: Diagnosis not present

## 2023-11-23 DIAGNOSIS — Z6821 Body mass index (BMI) 21.0-21.9, adult: Secondary | ICD-10-CM | POA: Diagnosis not present

## 2023-11-23 DIAGNOSIS — Z125 Encounter for screening for malignant neoplasm of prostate: Secondary | ICD-10-CM | POA: Diagnosis not present

## 2023-11-23 DIAGNOSIS — I739 Peripheral vascular disease, unspecified: Secondary | ICD-10-CM | POA: Diagnosis not present

## 2023-11-23 DIAGNOSIS — Z0000 Encounter for general adult medical examination without abnormal findings: Secondary | ICD-10-CM | POA: Diagnosis not present

## 2023-11-23 DIAGNOSIS — G479 Sleep disorder, unspecified: Secondary | ICD-10-CM | POA: Diagnosis not present

## 2023-11-23 DIAGNOSIS — E538 Deficiency of other specified B group vitamins: Secondary | ICD-10-CM | POA: Diagnosis not present

## 2023-11-23 DIAGNOSIS — E559 Vitamin D deficiency, unspecified: Secondary | ICD-10-CM | POA: Diagnosis not present

## 2023-11-23 DIAGNOSIS — M722 Plantar fascial fibromatosis: Secondary | ICD-10-CM | POA: Diagnosis not present

## 2023-11-23 DIAGNOSIS — J449 Chronic obstructive pulmonary disease, unspecified: Secondary | ICD-10-CM | POA: Diagnosis not present

## 2023-11-23 DIAGNOSIS — F1021 Alcohol dependence, in remission: Secondary | ICD-10-CM | POA: Diagnosis not present

## 2023-11-23 DIAGNOSIS — E7800 Pure hypercholesterolemia, unspecified: Secondary | ICD-10-CM | POA: Diagnosis not present

## 2023-11-23 DIAGNOSIS — Z862 Personal history of diseases of the blood and blood-forming organs and certain disorders involving the immune mechanism: Secondary | ICD-10-CM | POA: Diagnosis not present

## 2023-12-03 DIAGNOSIS — J441 Chronic obstructive pulmonary disease with (acute) exacerbation: Secondary | ICD-10-CM | POA: Diagnosis not present

## 2023-12-03 DIAGNOSIS — J449 Chronic obstructive pulmonary disease, unspecified: Secondary | ICD-10-CM | POA: Diagnosis not present

## 2023-12-03 DIAGNOSIS — R634 Abnormal weight loss: Secondary | ICD-10-CM | POA: Diagnosis not present

## 2023-12-03 DIAGNOSIS — Z6820 Body mass index (BMI) 20.0-20.9, adult: Secondary | ICD-10-CM | POA: Diagnosis not present

## 2023-12-04 ENCOUNTER — Encounter

## 2023-12-15 ENCOUNTER — Encounter

## 2023-12-22 ENCOUNTER — Ambulatory Visit

## 2023-12-22 DIAGNOSIS — J441 Chronic obstructive pulmonary disease with (acute) exacerbation: Secondary | ICD-10-CM

## 2023-12-24 DIAGNOSIS — H40013 Open angle with borderline findings, low risk, bilateral: Secondary | ICD-10-CM | POA: Diagnosis not present

## 2023-12-24 DIAGNOSIS — H25813 Combined forms of age-related cataract, bilateral: Secondary | ICD-10-CM | POA: Diagnosis not present

## 2023-12-24 DIAGNOSIS — H353211 Exudative age-related macular degeneration, right eye, with active choroidal neovascularization: Secondary | ICD-10-CM | POA: Diagnosis not present

## 2023-12-24 DIAGNOSIS — H353121 Nonexudative age-related macular degeneration, left eye, early dry stage: Secondary | ICD-10-CM | POA: Diagnosis not present

## 2023-12-28 DIAGNOSIS — H353211 Exudative age-related macular degeneration, right eye, with active choroidal neovascularization: Secondary | ICD-10-CM | POA: Diagnosis not present

## 2023-12-28 DIAGNOSIS — H353122 Nonexudative age-related macular degeneration, left eye, intermediate dry stage: Secondary | ICD-10-CM | POA: Diagnosis not present

## 2023-12-28 DIAGNOSIS — H43823 Vitreomacular adhesion, bilateral: Secondary | ICD-10-CM | POA: Diagnosis not present

## 2024-01-07 ENCOUNTER — Telehealth: Payer: Self-pay

## 2024-01-07 NOTE — Telephone Encounter (Signed)
 Copied from CRM [id number]. Topic: Clinical - Lab/Test Results >> Jan 07, 2024  8:57 AM Corean Deutscher wrote: Reason for CRM: Patient wife Amalia Badder called regarding sleep study results for the patient.    Spoke with PT ans wife and let them know results are not available and the MD will have to review them before we can give any info.  NFN

## 2024-01-08 ENCOUNTER — Ambulatory Visit: Admitting: Internal Medicine

## 2024-01-18 DIAGNOSIS — J449 Chronic obstructive pulmonary disease, unspecified: Secondary | ICD-10-CM | POA: Diagnosis not present

## 2024-01-18 DIAGNOSIS — J441 Chronic obstructive pulmonary disease with (acute) exacerbation: Secondary | ICD-10-CM | POA: Diagnosis not present

## 2024-01-18 DIAGNOSIS — E7800 Pure hypercholesterolemia, unspecified: Secondary | ICD-10-CM | POA: Diagnosis not present

## 2024-01-29 DIAGNOSIS — G4733 Obstructive sleep apnea (adult) (pediatric): Secondary | ICD-10-CM | POA: Diagnosis not present

## 2024-02-05 ENCOUNTER — Ambulatory Visit: Payer: Self-pay | Admitting: Nurse Practitioner

## 2024-02-09 DIAGNOSIS — H353122 Nonexudative age-related macular degeneration, left eye, intermediate dry stage: Secondary | ICD-10-CM | POA: Diagnosis not present

## 2024-02-09 DIAGNOSIS — H2513 Age-related nuclear cataract, bilateral: Secondary | ICD-10-CM | POA: Diagnosis not present

## 2024-02-09 DIAGNOSIS — H35432 Paving stone degeneration of retina, left eye: Secondary | ICD-10-CM | POA: Diagnosis not present

## 2024-02-09 DIAGNOSIS — H353211 Exudative age-related macular degeneration, right eye, with active choroidal neovascularization: Secondary | ICD-10-CM | POA: Diagnosis not present

## 2024-02-09 DIAGNOSIS — H43823 Vitreomacular adhesion, bilateral: Secondary | ICD-10-CM | POA: Diagnosis not present

## 2024-02-09 DIAGNOSIS — H11011 Amyloid pterygium of right eye: Secondary | ICD-10-CM | POA: Diagnosis not present

## 2024-02-15 ENCOUNTER — Ambulatory Visit: Payer: Self-pay | Admitting: Primary Care

## 2024-02-15 NOTE — Telephone Encounter (Signed)
 Copied from CRM [id number]. Topic: Clinical - Medication Question >> Feb 15, 2024  4:29 PM Rilla B wrote: Reason for CRM: [employer], patient special studies and they need to know date patient was diagnosed with COPD and the day he started the albuterol . Please call [phone], Joella.

## 2024-02-17 NOTE — Telephone Encounter (Signed)
 I called and spoke with Joshua Reed and let her know first eval with us  for COPD was in 2018. Nothing further needed.

## 2024-03-07 ENCOUNTER — Ambulatory Visit: Admitting: Nurse Practitioner

## 2024-03-09 ENCOUNTER — Ambulatory Visit: Admitting: Nurse Practitioner

## 2024-03-14 ENCOUNTER — Ambulatory Visit (INDEPENDENT_AMBULATORY_CARE_PROVIDER_SITE_OTHER): Admitting: Nurse Practitioner

## 2024-03-14 ENCOUNTER — Encounter: Payer: Self-pay | Admitting: Nurse Practitioner

## 2024-03-14 ENCOUNTER — Telehealth: Payer: Self-pay

## 2024-03-14 VITALS — BP 100/60 | HR 56 | Temp 97.7°F | Ht 68.0 in | Wt 129.6 lb

## 2024-03-14 DIAGNOSIS — G4733 Obstructive sleep apnea (adult) (pediatric): Secondary | ICD-10-CM | POA: Diagnosis not present

## 2024-03-14 DIAGNOSIS — Z87891 Personal history of nicotine dependence: Secondary | ICD-10-CM

## 2024-03-14 DIAGNOSIS — J449 Chronic obstructive pulmonary disease, unspecified: Secondary | ICD-10-CM

## 2024-03-14 MED ORDER — TRELEGY ELLIPTA 200-62.5-25 MCG/ACT IN AEPB: 1.0000 | INHALATION_SPRAY | Freq: Every day | RESPIRATORY_TRACT | 0 refills | Status: DC

## 2024-03-14 MED ORDER — TRELEGY ELLIPTA 200-62.5-25 MCG/ACT IN AEPB: 1.0000 | INHALATION_SPRAY | Freq: Every day | RESPIRATORY_TRACT | Status: DC

## 2024-03-14 NOTE — Patient Instructions (Addendum)
 Restart Trelegy 1 puffs every daily. Brush tongue and rinse mouth afterwards Continue Albuterol  inhaler 2 puffs or 3 mL neb every 6 hours as needed for shortness of breath or wheezing. Notify if symptoms persist despite rescue inhaler/neb use.  Continue flonase  2 sprays each nostril daily Continue xyzal  1 tab daily  Continue Singulair  (montelukast ) 1 tab At bedtime for allergies    Start CPAP every night, minimum of 4-6 hours a night.  Change equipment as directed. Wash your tubing with warm soap and water  daily, hang to dry. Wash humidifier portion weekly. Use bottled, distilled water  and change daily Be aware of reduced alertness and do not drive or operate heavy machinery if experiencing this or drowsiness.  Exercise encouraged, as tolerated. Healthy weight management discussed.  Avoid or decrease alcohol consumption and medications that make you more sleepy, if possible. Notify if persistent daytime sleepiness occurs even with consistent use of PAP therapy.  Change CPAP supplies... Every month Mask cushions and/or nasal pillows CPAP machine filters Every 3 months Mask frame (not including the headgear) CPAP tubing Every 6 months Mask headgear Chin strap (if applicable) Humidifier water  tub  We discussed how untreated sleep apnea puts an individual at risk for cardiac arrhthymias, pulm HTN, DM, stroke and increases their risk for daytime accidents. We also briefly reviewed treatment options including weight loss, side sleeping position, oral appliance, CPAP therapy or referral to ENT for possible surgical options  Follow up in 10-12 weeks with Joshua Elmira Olkowski,NP to see how CPAP is working at [facility]. 6 months with Dr. Geronimo. If symptoms do not improve or worsen, please contact office for sooner follow up or seek emergency care.

## 2024-03-14 NOTE — Assessment & Plan Note (Signed)
 Mild OSA. Minimal cardiovascular impact related to mild OSA. Significant daytime symptom burden. Shared decision to trial CPAP therapy. Will start auto CPAP 5-15 cmH2O, mask of choice and heated humidity. Safe driving practices reviewed.

## 2024-03-14 NOTE — Assessment & Plan Note (Signed)
 Very severe COPD with high symptom burden. Last exacerbation 10/2023. Compensated on current regimen. Inconsistent use with Trelegy. Discussed importance of compliance with maintenance therapy. Suspect high copay relatable to deductible. Will provide with samples and [employer] PA paperwork. Advised to contact his insurance/pharmacy regarding the rx cost. Notify of any difficulties obtaining moving forward. Felt pulmonary rehab was not beneficial so he stopped going. Previous walk test without desaturations. Action plan in place.   Patient Instructions  Restart Trelegy 1 puffs every daily. Brush tongue and rinse mouth afterwards Continue Albuterol  inhaler 2 puffs or 3 mL neb every 6 hours as needed for shortness of breath or wheezing. Notify if symptoms persist despite rescue inhaler/neb use.  Continue flonase  2 sprays each nostril daily Continue xyzal  1 tab daily  Continue Singulair  (montelukast ) 1 tab At bedtime for allergies    Start CPAP every night, minimum of 4-6 hours a night.  Change equipment as directed. Wash your tubing with warm soap and water  daily, hang to dry. Wash humidifier portion weekly. Use bottled, distilled water  and change daily Be aware of reduced alertness and do not drive or operate heavy machinery if experiencing this or drowsiness.  Exercise encouraged, as tolerated. Healthy weight management discussed.  Avoid or decrease alcohol consumption and medications that make you more sleepy, if possible. Notify if persistent daytime sleepiness occurs even with consistent use of PAP therapy.  Change CPAP supplies... Every month Mask cushions and/or nasal pillows CPAP machine filters Every 3 months Mask frame (not including the headgear) CPAP tubing Every 6 months Mask headgear Chin strap (if applicable) Humidifier water  tub  We discussed how untreated sleep apnea puts an individual at risk for cardiac arrhthymias, pulm HTN, DM, stroke and increases their risk for daytime  accidents. We also briefly reviewed treatment options including weight loss, side sleeping position, oral appliance, CPAP therapy or referral to ENT for possible surgical options  Follow up in 10-12 weeks with Izetta Ether Wolters,NP to see how CPAP is working at [facility]. 6 months with Dr. Geronimo. If symptoms do not improve or worsen, please contact office for sooner follow up or seek emergency care.

## 2024-03-14 NOTE — Assessment & Plan Note (Signed)
 Significant smoking history. Overdue for LDCT chest. Follow up with lung cancer screening program to schedule overdue CT

## 2024-03-14 NOTE — Telephone Encounter (Signed)
 Received team message from Suli Dimas, [place].  Per Izetta, place Trelegy 200 samples up front for pick up.  I have placed the samples up front.

## 2024-03-14 NOTE — Progress Notes (Signed)
 @Patient  ID: Joshua Reed, male    DOB: April 07, 1946, 78 y.o.   MRN: [id number]  Chief Complaint  Patient presents with   Obstructive Sleep Apnea    Home sleep test completed on 12/22/2023.    COPD    Shortness of breath and cough on exertion. Using Trelegy as needed due to cost.     Referring provider: [name], MD  HPI: 78 year old male, former smoker (60 pack years) followed for COPD with chronic bronchitis and emphysema. He is followed by the lung cancer screening program with last LDCT chest on 06/11/2022. Past medical history significant for seizure disorder, ETOH abuse, macular degeneration, severe protein calorie malnutrition, tobacco abuse, HLD.   TEST/EVENTS:  10/20/2016 Spirometry: FVC 1.8 (45), FEV1 0.9 (29), ratio 47. Very severe obstructive airway disease.  03/08/2021 LDCT chest: atherosclerosis. Centrilobular emphysema. Biapical pleuroparenchymal scarring. Calcified RLL granuloma. No nodules present - Lung-RADS1  06/10/2022 LDCT chest: Lung RADS 2. CAD. 10/08/2022 CT chest wo con: stable 4 mm RUL nodule, larger on CT August 2023. Likely benign. Emphysema. No acute process. Atheroslcerosis.  01/05/2023 PFT: FVC 52, FEV1 27, ratio 41, TLC 117, DLCOcor 38. Very severe COPD with reversibility and severe diffusion defect  12/22/2023 HST: AHI 9.1/h, SpO2 low 84%   10/23/2022: OV with Dr. Geronimo. CT chest from March 2024 with emphysema; no ILD and only a 4 mm lung nodule. Symptoms worse over the past 10 days, similar to when he needed prednisone . Spring allergies are also very bothersome. Coughing, wheezing, nocturnal symptoms. Using albuterol  quite a bit. Does not want abx. Interested in prednisone . Blood work - CBC with diff, RAST panel, BNP, troponin, A1AT. Prednisone  taper. Echo and full PFT ordered. Change Trelegy to Breztri. ONO on room air.  12/03/2022: OV with Ahlivia Salahuddin NP for follow up. He has been doing better since his last visit and completing steroids. Started Breztri  and hasn't had any more sores in his mouth. He doesn't know if he notices a huge change compared to the Trelegy, as far as his breathing goes. He does have trouble with longer distances and uphill climbing. He usually has to stop when he is mowing the grass to catch his breath. He has a chronic daily cough. Doesn't notice much wheezing now. He does have daily allergy symptoms with nasal congestion, drainage, and throat clearing. He denies any hemoptysis, wheezing, chills, fevers, night sweats, leg swelling, orthopnea. Uses albuterol  1-2 times a day, which is baseline for him. Takes flonase  and xyzal  for his allergies. His wife wants him to start being more active.  She's also worried about his sleep at night. She says that he has weird dreams that frequently wake him up. He's tired most days and dozes off when he's not doing much. She says she's questioned if he's been breathing at night. He has dry mouth at night. He denies any snoring, drowsy driving, sleep talking/walking, sleep paralysis. Never had a previous sleep study. Wife is worried he has sleep apnea.   01/05/2023: OV with Kade Demicco NP for follow up with his wife. He had pulmonary function testing earlier today, which was overall stable compared to his spirometry testing from 2018. He has very severe obstructive lung disease with reversibility and a severe diffusing defect. He tells me his breathing is feeling about the same compared to his last visit. Hasn't had any more trouble with wheezing or cough since his visit in April. He has been using his Breztri. Sometimes he thinks Trelegy worked  better but it gave him mouth sores. He has never tried his inhaler with a spacer. His allergy symptoms had gotten better with the singulair . He stopped taking this a few weeks ago because he wasn't sure if he needed to continue it or not. He denies any fevers, chills, hemoptysis, leg swelling, orthopnea. He feels like his sleep has been better since he was here last. He  does still have some leg pains in the morning but he feels like he's sleeping better and not as tired during the day. He never did the HST because of this. He denies snoring, drowsy driving, morning headaches. He still wants to go to pulmonary rehab - never heard back after his last visit.   03/14/2024: Today - follow up Discussed the use of AI scribe software for clinical note transcription with the patient, who gave verbal consent to proceed.  History of Present Illness Joshua Reed is a 78 year old male with COPD who presents with fatigue and sleep disturbances. He had a sleep study revealing mild OSA.   He experiences significant fatigue and sleep disturbances, characterized by waking up around 1 or 2 AM and having difficulty returning to sleep. A sleep study indicated mild sleep apnea. He denies snoring but feels tired and does not sleep well at night. No issues with drowsy driving or sleep parasomnias.   He has a history of COPD and does not use his Trelegy inhaler consistently due to its cost. The initial purchase was $600 due to a deductible, but his monthly cost is down to around $100. He does have a deductible he has to meet, which they didn't realize may be contributing. He experiences shortness of breath, especially when lifting or exerting himself. He's able to walk without difficulties but finds uphill climbing hard. He uses a nebulizer as needed. Using albuterol  a few times a week.  Overall, he feels stable compared to his last visit.   He has been prescribed Singulair  for allergies and asthma, which he has not been taking regularly. He experiences postnasal drip and throat clearing, particularly during spring and fall, which may be related to allergies. Takes xyzal  and flonase . Minimal cough. No hemoptysis, fevers, chills, weight loss, anorexia.     Allergies  Allergen Reactions   Acyclovir And Related Hives    Immunization History  Administered Date(s) Administered    INFLUENZA, HIGH DOSE SEASONAL PF 07/12/2014, 05/15/2017, 07/29/2018, 05/16/2019   PFIZER(Purple Top)SARS-COV-2 Vaccination 09/25/2019, 10/19/2019   Pneumococcal Conjugate-13 01/10/2015   Pneumococcal Polysaccharide-23 04/14/2013   Tdap 05/24/2014   Zoster, Live 04/14/2013    Past Medical History:  Diagnosis Date   Alcohol abuse    COPD (chronic obstructive pulmonary disease) (HCC)    Hyperlipidemia    Low vitamin B12 level    Macular degeneration    Seizure disorder (HCC) 2205   ALCOHOL RELATED   Seizures (HCC)    Vitamin D deficiency     Tobacco History: Social History   Tobacco Use  Smoking Status Former   Current packs/day: 0.00   Types: Cigarettes   Quit date: 08/06/2021   Years since quitting: 2.6  Smokeless Tobacco Never   Counseling given: Not Answered   Outpatient Medications Prior to Visit  Medication Sig Dispense Refill   albuterol  (PROVENTIL ) (2.5 MG/3ML) 0.083% nebulizer solution Take 3 mLs (2.5 mg total) by nebulization every 4 (four) hours as needed for wheezing or shortness of breath. 75 mL 12   albuterol  (VENTOLIN  HFA) 108 (90 Base)  MCG/ACT inhaler Inhale 2 puffs into the lungs every 6 (six) hours as needed for wheezing or shortness of breath. 18 g 6   aspirin  EC 81 MG tablet Take 1 tablet (81 mg total) by mouth daily. Swallow whole. (Patient taking differently: Take 81 mg by mouth daily. Swallow whole. Most days) 90 tablet 3   Cholecalciferol  50 MCG (2000 UT) CAPS Take 10,000 Units by mouth daily at 6 (six) AM.     Coenzyme Q10 (CO Q 10 PO) Take 200 mg by mouth daily.     Cyanocobalamin  (B-12 IJ) Takes injection as directed bi weekly     Fluticasone -Umeclidin-Vilant (TRELEGY ELLIPTA ) 200-62.5-25 MCG/ACT AEPB Inhale 1 puff into the lungs daily. (Patient taking differently: Inhale 1 puff into the lungs daily. Uses as needed.) 28 each 11   ipratropium-albuterol  (DUONEB) 0.5-2.5 (3) MG/3ML SOLN Take 3 mLs by nebulization every 4 (four) hours as needed.      levETIRAcetam  (KEPPRA ) 1000 MG tablet Take 1 tablet (1,000 mg total) by mouth 2 (two) times daily. 180 tablet 3   MAGNESIUM  GLYCINATE PO Take by mouth.     Multiple Vitamin (MULTIVITAMIN WITH MINERALS) TABS tablet Take 1 tablet by mouth daily.     Omega-3 Fatty Acids (FISH OIL PO) Take 1 capsule by mouth daily.     atorvastatin  (LIPITOR) 10 MG tablet Take 1 tablet (10 mg total) by mouth daily. (Patient not taking: Reported on 03/14/2024) 90 tablet 3   fluticasone  (FLONASE ) 50 MCG/ACT nasal spray Place 2 sprays into both nostrils daily. (Patient not taking: Reported on 03/14/2024) 18.2 mL 2   montelukast  (SINGULAIR ) 10 MG tablet Take 1 tablet (10 mg total) by mouth at bedtime. (Patient not taking: Reported on 03/14/2024) 30 tablet 5   ezetimibe  (ZETIA ) 10 MG tablet Take 1 tablet (10 mg total) by mouth daily. (Patient not taking: Reported on 03/14/2024) 30 tablet 6   Fluticasone -Umeclidin-Vilant (TRELEGY ELLIPTA ) 200-62.5-25 MCG/ACT AEPB 1 sample     No facility-administered medications prior to visit.     Review of Systems:   Constitutional: No weight loss or gain, night sweats, fevers, chills. +fatigue  HEENT: No headaches, difficulty swallowing, tooth/dental problems, or sore throat. No sneezing, itching, ear ache+occasional nasal congestion.  CV:  No chest pain, orthopnea, PND, swelling in lower extremities, anasarca, dizziness, palpitations, syncope Resp: +shortness of breath with exertion; chronic cough. No excess mucus or change in color of mucus. No hemoptysis. No wheezing.  No chest wall deformity GI:  No heartburn, indigestion Skin: No rash, lesions, ulcerations MSK:  No joint pain or swelling.   Neuro: No dizziness or lightheadedness.  Psych: No depression or anxiety. Mood stable. +sleep disturbance    Physical Exam:  BP 100/60   Pulse (!) 56   Temp 97.7 F (36.5 C) (Oral)   Ht 5' 8 (1.727 m)   Wt 129 lb 9.6 oz (58.8 kg)   SpO2 96%   BMI 19.71 kg/m   GEN: Pleasant,  interactive, well-appearing; in no acute distress. HEENT:  Normocephalic and atraumatic. PERRLA. Sclera white. Nasal turbinates pink, moist and patent bilaterally. No rhinorrhea present. Oropharynx pink and moist, without exudate or edema. No lesions, ulcerations. Mallampati II NECK:  Supple w/ fair ROM. No JVD present. Normal carotid impulses w/o bruits. Thyroid  symmetrical with no goiter or nodules palpated. No lymphadenopathy.   CV: RRR, no m/r/g, no peripheral edema. Pulses intact, +2 bilaterally. No cyanosis, pallor or clubbing. PULMONARY:  Unlabored, regular breathing. Diminished bases bilaterally A&P w/o wheezes/rales/rhonchi. No  accessory muscle use. No dullness to percussion. GI: BS present and normoactive. Soft, non-tender to palpation. MSK: No erythema, warmth or tenderness. Cap refil <2 sec all extrem.  Neuro: A/Ox3. No focal deficits noted.   Skin: Warm, no lesions or rashe Psych: Normal affect and behavior. Judgement and thought content appropriate.     Lab Results:  CBC    Component Value Date/Time   WBC 7.0 10/23/2022 1036   RBC 4.62 10/23/2022 1036   HGB 14.2 10/23/2022 1036   HCT 42.7 10/23/2022 1036   PLT 261.0 10/23/2022 1036   MCV 92.4 10/23/2022 1036   MCH 30.2 07/03/2014 0511   MCHC 33.2 10/23/2022 1036   RDW 13.8 10/23/2022 1036   LYMPHSABS 1.9 10/23/2022 1036   MONOABS 0.7 10/23/2022 1036   EOSABS 0.7 10/23/2022 1036   BASOSABS 0.0 10/23/2022 1036    BMET    Component Value Date/Time   NA 134 (L) 07/03/2014 0511   K 4.2 07/03/2014 0511   CL 100 07/03/2014 0511   CO2 25 07/03/2014 0511   GLUCOSE 80 07/03/2014 0511   BUN 7 07/03/2014 0511   CREATININE 1.10 12/16/2022 1914   CALCIUM  7.8 (L) 07/03/2014 0511   GFRNONAA >90 07/03/2014 0511   GFRAA >90 07/03/2014 0511    BNP No results found for: BNP   Imaging:  No results found.  Administration History     None           Latest Ref Rng & Units 01/05/2023    8:01 AM  PFT Results   FVC-Pre L 2.05   FVC-Predicted Pre % 52   FVC-Post L 2.58   FVC-Predicted Post % 66   Pre FEV1/FVC % % 37   Post FEV1/FCV % % 41   FEV1-Pre L 0.76   FEV1-Predicted Pre % 27   FEV1-Post L 1.06   DLCO uncorrected ml/min/mmHg 9.14   DLCO UNC% % 38   DLCO corrected ml/min/mmHg 9.14   DLCO COR %Predicted % 38   DLVA Predicted % 53   TLC L 7.81   TLC % Predicted % 117   RV % Predicted % 228     No results found for: NITRICOXIDE      Assessment & Plan:   COPD, very severe (HCC) Very severe COPD with high symptom burden. Last exacerbation 10/2023. Compensated on current regimen. Inconsistent use with Trelegy. Discussed importance of compliance with maintenance therapy. Suspect high copay relatable to deductible. Will provide with samples and [employer] PA paperwork. Advised to contact his insurance/pharmacy regarding the rx cost. Notify of any difficulties obtaining moving forward. Felt pulmonary rehab was not beneficial so he stopped going. Previous walk test without desaturations. Action plan in place.   Patient Instructions  Restart Trelegy 1 puffs every daily. Brush tongue and rinse mouth afterwards Continue Albuterol  inhaler 2 puffs or 3 mL neb every 6 hours as needed for shortness of breath or wheezing. Notify if symptoms persist despite rescue inhaler/neb use.  Continue flonase  2 sprays each nostril daily Continue xyzal  1 tab daily  Continue Singulair  (montelukast ) 1 tab At bedtime for allergies    Start CPAP every night, minimum of 4-6 hours a night.  Change equipment as directed. Wash your tubing with warm soap and water  daily, hang to dry. Wash humidifier portion weekly. Use bottled, distilled water  and change daily Be aware of reduced alertness and do not drive or operate heavy machinery if experiencing this or drowsiness.  Exercise encouraged, as tolerated. Healthy weight management discussed.  Avoid or decrease alcohol consumption and medications that make you more sleepy,  if possible. Notify if persistent daytime sleepiness occurs even with consistent use of PAP therapy.  Change CPAP supplies... Every month Mask cushions and/or nasal pillows CPAP machine filters Every 3 months Mask frame (not including the headgear) CPAP tubing Every 6 months Mask headgear Chin strap (if applicable) Humidifier water  tub  We discussed how untreated sleep apnea puts an individual at risk for cardiac arrhthymias, pulm HTN, DM, stroke and increases their risk for daytime accidents. We also briefly reviewed treatment options including weight loss, side sleeping position, oral appliance, CPAP therapy or referral to ENT for possible surgical options  Follow up in 10-12 weeks with Izetta Faye Strohman,NP to see how CPAP is working at [facility]. 6 months with Dr. Geronimo. If symptoms do not improve or worsen, please contact office for sooner follow up or seek emergency care.    Mild obstructive sleep apnea Mild OSA. Minimal cardiovascular impact related to mild OSA. Significant daytime symptom burden. Shared decision to trial CPAP therapy. Will start auto CPAP 5-15 cmH2O, mask of choice and heated humidity. Safe driving practices reviewed.   History of tobacco use disorder Significant smoking history. Overdue for LDCT chest. Follow up with lung cancer screening program to schedule overdue CT    I spent 35 minutes of dedicated to the care of this patient on the date of this encounter to include pre-visit review of records, face-to-face time with the patient discussing conditions above, post visit ordering of testing, clinical documentation with the electronic health record, making appropriate referrals as documented, and communicating necessary findings to members of the patients care team.  Comer LULLA Rouleau, NP 03/14/2024  Pt aware and understands NP's role. SABRA

## 2024-04-05 DIAGNOSIS — H353212 Exudative age-related macular degeneration, right eye, with inactive choroidal neovascularization: Secondary | ICD-10-CM | POA: Diagnosis not present

## 2024-04-05 DIAGNOSIS — H35432 Paving stone degeneration of retina, left eye: Secondary | ICD-10-CM | POA: Diagnosis not present

## 2024-04-05 DIAGNOSIS — H11011 Amyloid pterygium of right eye: Secondary | ICD-10-CM | POA: Diagnosis not present

## 2024-04-05 DIAGNOSIS — H2513 Age-related nuclear cataract, bilateral: Secondary | ICD-10-CM | POA: Diagnosis not present

## 2024-04-05 DIAGNOSIS — H43823 Vitreomacular adhesion, bilateral: Secondary | ICD-10-CM | POA: Diagnosis not present

## 2024-04-05 DIAGNOSIS — H353122 Nonexudative age-related macular degeneration, left eye, intermediate dry stage: Secondary | ICD-10-CM | POA: Diagnosis not present

## 2024-05-09 ENCOUNTER — Telehealth: Payer: Self-pay

## 2024-05-09 NOTE — Telephone Encounter (Signed)
 Copied from CRM #[id number]. Topic: General - Other >> May 09, 2024 10:51 AM Corean SAUNDERS wrote: Reason for CRM: Patient was advised by Comer Rouleau at [facility] to call [facility] and ask if there any any TRELEGY samples available.  Please call  Daylon Lafavor (Wife), [phone] Called; No, answer. Patient's mail box is full

## 2024-05-24 ENCOUNTER — Encounter: Payer: Self-pay | Admitting: Nurse Practitioner

## 2024-05-24 ENCOUNTER — Ambulatory Visit (INDEPENDENT_AMBULATORY_CARE_PROVIDER_SITE_OTHER): Admitting: Nurse Practitioner

## 2024-05-24 VITALS — BP 101/66 | HR 57 | Temp 98.5°F | Ht 68.0 in | Wt 129.2 lb

## 2024-05-24 DIAGNOSIS — J3089 Other allergic rhinitis: Secondary | ICD-10-CM

## 2024-05-24 DIAGNOSIS — Z87891 Personal history of nicotine dependence: Secondary | ICD-10-CM

## 2024-05-24 DIAGNOSIS — G4733 Obstructive sleep apnea (adult) (pediatric): Secondary | ICD-10-CM

## 2024-05-24 DIAGNOSIS — J309 Allergic rhinitis, unspecified: Secondary | ICD-10-CM

## 2024-05-24 DIAGNOSIS — J449 Chronic obstructive pulmonary disease, unspecified: Secondary | ICD-10-CM | POA: Diagnosis not present

## 2024-05-24 NOTE — Assessment & Plan Note (Signed)
 Significant smoking history. Due for LDCT chest. Follow up with lung cancer screening program to schedule CT

## 2024-05-24 NOTE — Assessment & Plan Note (Signed)
 Very severe COPD with high symptom burden. Last exacerbation 10/2023. High symptom burden partially relate dto  inconsistent use with Trelegy. Again, discussed importance of compliance with maintenance therapy. Suspect high copay relatable to deductible. Provided PA paperwork at last OV but never completed. Will provide with  additional samples and [employer] PA paperwork. Advised to contact his insurance agent to discuss his plan for next year and ensure medications are covered. Reviewed risks of non-compliance with maintenance regimen. Notify of any difficulties obtaining moving forward. Felt pulmonary rehab was not beneficial so he stopped going in the past. Previous walk test without desaturations. Encouraged to work on graded exercises. If he still has difficulties despite consistent inhaler use, could consider addition of biologic or inhaled phosphodiesterase inhibitor. Action plan in place.   Patient Instructions  Ensure you're using Trelegy 1 puffs every day. Brush tongue and rinse mouth afterwards Continue Albuterol  inhaler 2 puffs or 3 mL neb every 6 hours as needed for shortness of breath or wheezing. Notify if symptoms persist despite rescue inhaler/neb use.  Continue flonase  2 sprays each nostril daily Continue xyzal  1 tab daily  Continue Singulair  (montelukast ) 1 tab At bedtime for allergies    Should have your lung cancer screening CT chest this month. If you haven't heard something in the next 2-3 weeks, call and let us    Increase use of CPAP every night, minimum of 4-6 hours a night.  Change equipment as directed. Wash your tubing with warm soap and water  daily, hang to dry. Wash humidifier portion weekly. Use bottled, distilled water  and change daily Be aware of reduced alertness and do not drive or operate heavy machinery if experiencing this or drowsiness.  Exercise encouraged, as tolerated. Healthy weight management discussed.  Avoid or decrease alcohol consumption and medications that  make you more sleepy, if possible. Notify if persistent daytime sleepiness occurs even with consistent use of PAP therapy.  Change CPAP supplies... Every month Mask cushions and/or nasal pillows CPAP machine filters Every 3 months Mask frame (not including the headgear) CPAP tubing Every 6 months Mask headgear Chin strap (if applicable) Humidifier water  tub  Follow up in 3 months with Dr. Geronimo or  If symptoms do not improve or worsen, please contact office for sooner follow up or seek emergency care

## 2024-05-24 NOTE — Patient Instructions (Addendum)
 Ensure you're using Trelegy 1 puffs every day. Brush tongue and rinse mouth afterwards Continue Albuterol  inhaler 2 puffs or 3 mL neb every 6 hours as needed for shortness of breath or wheezing. Notify if symptoms persist despite rescue inhaler/neb use.  Continue flonase  2 sprays each nostril daily Continue xyzal  1 tab daily  Continue Singulair  (montelukast ) 1 tab At bedtime for allergies    Should have your lung cancer screening CT chest this month. If you haven't heard something in the next 2-3 weeks, call and let us    Increase use of CPAP every night, minimum of 4-6 hours a night.  Change equipment as directed. Wash your tubing with warm soap and water  daily, hang to dry. Wash humidifier portion weekly. Use bottled, distilled water  and change daily Be aware of reduced alertness and do not drive or operate heavy machinery if experiencing this or drowsiness.  Exercise encouraged, as tolerated. Healthy weight management discussed.  Avoid or decrease alcohol consumption and medications that make you more sleepy, if possible. Notify if persistent daytime sleepiness occurs even with consistent use of PAP therapy.  Change CPAP supplies... Every month Mask cushions and/or nasal pillows CPAP machine filters Every 3 months Mask frame (not including the headgear) CPAP tubing Every 6 months Mask headgear Chin strap (if applicable) Humidifier water  tub  Follow up in 3 months with Dr. Geronimo or  If symptoms do not improve or worsen, please contact office for sooner follow up or seek emergency care

## 2024-05-24 NOTE — Progress Notes (Signed)
 @Patient  ID: Joshua Reed, male    DOB: [birth year], 78 y.o.   MRN: [id number]  Chief Complaint  Patient presents with   Medical Management of Chronic Issues    F/u for OSA doesn't use CPAP all the time     Referring provider: [name], MD  HPI: 78 year old male, former smoker (60 pack years) followed for COPD with chronic bronchitis and emphysema. He is followed by the lung cancer screening program with last LDCT chest on 06/11/2022. Past medical history significant for seizure disorder, ETOH abuse, macular degeneration, severe protein calorie malnutrition, tobacco abuse, HLD.   TEST/EVENTS:  10/20/2016 Spirometry: FVC 1.8 (45), FEV1 0.9 (29), ratio 47. Very severe obstructive airway disease.  03/08/2021 LDCT chest: atherosclerosis. Centrilobular emphysema. Biapical pleuroparenchymal scarring. Calcified RLL granuloma. No nodules present - Lung-RADS1  06/10/2022 LDCT chest: Lung RADS 2. CAD. 10/08/2022 CT chest wo con: stable 4 mm RUL nodule, larger on CT August 2023. Likely benign. Emphysema. No acute process. Atheroslcerosis.  01/05/2023 PFT: FVC 52, FEV1 27, ratio 41, TLC 117, DLCOcor 38. Very severe COPD with reversibility and severe diffusion defect  12/22/2023 HST: AHI 9.1/h, SpO2 low 84%   10/23/2022: OV with Dr. Geronimo. CT chest from March 2024 with emphysema; no ILD and only a 4 mm lung nodule. Symptoms worse over the past 10 days, similar to when he needed prednisone . Spring allergies are also very bothersome. Coughing, wheezing, nocturnal symptoms. Using albuterol  quite a bit. Does not want abx. Interested in prednisone . Blood work - CBC with diff, RAST panel, BNP, troponin, A1AT. Prednisone  taper. Echo and full PFT ordered. Change Trelegy to Breztri. ONO on room air.  12/03/2022: OV with Joshua Long NP for follow up. He has been doing better since his last visit and completing steroids. Started Breztri and hasn't had any more sores in his mouth. He doesn't know if he notices a  huge change compared to the Trelegy, as far as his breathing goes. He does have trouble with longer distances and uphill climbing. He usually has to stop when he is mowing the grass to catch his breath. He has a chronic daily cough. Doesn't notice much wheezing now. He does have daily allergy symptoms with nasal congestion, drainage, and throat clearing. He denies any hemoptysis, wheezing, chills, fevers, night sweats, leg swelling, orthopnea. Uses albuterol  1-2 times a day, which is baseline for him. Takes flonase  and xyzal  for his allergies. His wife wants him to start being more active.  She's also worried about his sleep at night. She says that he has weird dreams that frequently wake him up. He's tired most days and dozes off when he's not doing much. She says she's questioned if he's been breathing at night. He has dry mouth at night. He denies any snoring, drowsy driving, sleep talking/walking, sleep paralysis. Never had a previous sleep study. Wife is worried he has sleep apnea.   01/05/2023: OV with Joshua Dosch NP for follow up with his wife. He had pulmonary function testing earlier today, which was overall stable compared to his spirometry testing from 2018. He has very severe obstructive lung disease with reversibility and a severe diffusing defect. He tells me his breathing is feeling about the same compared to his last visit. Hasn't had any more trouble with wheezing or cough since his visit in April. He has been using his Breztri. Sometimes he thinks Trelegy worked better but it gave him mouth sores. He has never tried his inhaler with a spacer.  His allergy symptoms had gotten better with the singulair . He stopped taking this a few weeks ago because he wasn't sure if he needed to continue it or not. He denies any fevers, chills, hemoptysis, leg swelling, orthopnea. He feels like his sleep has been better since he was here last. He does still have some leg pains in the morning but he feels like he's  sleeping better and not as tired during the day. He never did the HST because of this. He denies snoring, drowsy driving, morning headaches. He still wants to go to pulmonary rehab - never heard back after his last visit.   03/14/2024: Ov with Joshua Conde NP Joshua Reed is a 78 year old male with COPD who presents with fatigue and sleep disturbances. He had a sleep study revealing mild OSA.  He experiences significant fatigue and sleep disturbances, characterized by waking up around 1 or 2 AM and having difficulty returning to sleep. A sleep study indicated mild sleep apnea. He denies snoring but feels tired and does not sleep well at night. No issues with drowsy driving or sleep parasomnias.  He has a history of COPD and does not use his Trelegy inhaler consistently due to its cost. The initial purchase was $600 due to a deductible, but his monthly cost is down to around $100. He does have a deductible he has to meet, which they didn't realize may be contributing. He experiences shortness of breath, especially when lifting or exerting himself. He's able to walk without difficulties but finds uphill climbing hard. He uses a nebulizer as needed. Using albuterol  a few times a week.  Overall, he feels stable compared to his last visit.  He has been prescribed Singulair  for allergies and asthma, which he has not been taking regularly. He experiences postnasal drip and throat clearing, particularly during spring and fall, which may be related to allergies. Takes xyzal  and flonase . Minimal cough. No hemoptysis, fevers, chills, weight loss, anorexia.   05/24/2024: Today - follow up Discussed the use of AI scribe software for clinical note transcription with the patient, who gave verbal consent to proceed.  History of Present Illness  Joshua Reed is a 78 year old male with COPD who presents with persistent shortness of breath.  He experiences significant shortness of breath, particularly  during physical activities such as raking the yard, necessitating frequent breaks to catch his breath. He feels the same compared to his last visit. He's still not using the Trelegy consistently. He has some confusion regarding the frequency of use of his inhalers and his CPAP. Occasional cough and throat clearing, which is baseline for him. No hemoptysis, anorexia, weight loss, night sweats, chest congestion.   The Trelegy is expensive. Initially, Trelegy cost over $600, and currently, it costs $148 per refill. He can afford the medication but difficult to want to pay this much for an inhaler. He uses albuterol  a few times a week.   He has a CPAP machine for mild sleep apnea but uses it infrequently, primarily when his breathing is bad. He does feel like it helps when he uses it. No drowsy driving.   His lung function is at 27%. He reflects on past smoking habits and their impact on his lung health, expressing regret about smoking without awareness of Reed-term effects.    Allergies  Allergen Reactions   Acyclovir And Related Hives    Immunization History  Administered Date(s) Administered   INFLUENZA, HIGH DOSE SEASONAL PF 07/12/2014,  05/15/2017, 07/29/2018, 05/16/2019   PFIZER(Purple Top)SARS-COV-2 Vaccination 09/25/2019, 10/19/2019   Pneumococcal Conjugate-13 01/10/2015   Pneumococcal Polysaccharide-23 04/14/2013   Tdap 05/24/2014   Zoster, Live 04/14/2013    Past Medical History:  Diagnosis Date   Alcohol abuse    COPD (chronic obstructive pulmonary disease) (HCC)    Hyperlipidemia    Low vitamin B12 level    Macular degeneration    Seizure disorder (HCC) 2205   ALCOHOL RELATED   Seizures (HCC)    Vitamin D deficiency     Tobacco History: Social History   Tobacco Use  Smoking Status Former   Current packs/day: 0.00   Types: Cigarettes   Quit date: 08/06/2021   Years since quitting: 2.8  Smokeless Tobacco Never   Counseling given: Not Answered   Outpatient  Medications Prior to Visit  Medication Sig Dispense Refill   albuterol  (PROVENTIL ) (2.5 MG/3ML) 0.083% nebulizer solution Take 3 mLs (2.5 mg total) by nebulization every 4 (four) hours as needed for wheezing or shortness of breath. 75 mL 12   albuterol  (VENTOLIN  HFA) 108 (90 Base) MCG/ACT inhaler Inhale 2 puffs into the lungs every 6 (six) hours as needed for wheezing or shortness of breath. 18 g 6   aspirin  EC 81 MG tablet Take 1 tablet (81 mg total) by mouth daily. Swallow whole. (Patient taking differently: Take 81 mg by mouth daily. Swallow whole. Most days) 90 tablet 3   Cholecalciferol  50 MCG (2000 UT) CAPS Take 10,000 Units by mouth daily at 6 (six) AM.     Coenzyme Q10 (CO Q 10 PO) Take 200 mg by mouth daily.     Cyanocobalamin  (B-12 IJ) Takes injection as directed bi weekly     Fluticasone -Umeclidin-Vilant (TRELEGY ELLIPTA ) 200-62.5-25 MCG/ACT AEPB Inhale 1 puff into the lungs daily. (Patient taking differently: Inhale 1 puff into the lungs daily. Uses as needed.) 28 each 11   Fluticasone -Umeclidin-Vilant (TRELEGY ELLIPTA ) 200-62.5-25 MCG/ACT AEPB Inhale 1 puff into the lungs daily in the afternoon. 1 each 0   Fluticasone -Umeclidin-Vilant (TRELEGY ELLIPTA ) 200-62.5-25 MCG/ACT AEPB Inhale 1 puff into the lungs daily.     ipratropium-albuterol  (DUONEB) 0.5-2.5 (3) MG/3ML SOLN Take 3 mLs by nebulization every 4 (four) hours as needed.     levETIRAcetam  (KEPPRA ) 1000 MG tablet Take 1 tablet (1,000 mg total) by mouth 2 (two) times daily. 180 tablet 3   MAGNESIUM  GLYCINATE PO Take by mouth.     Multiple Vitamin (MULTIVITAMIN WITH MINERALS) TABS tablet Take 1 tablet by mouth daily.     Omega-3 Fatty Acids (FISH OIL PO) Take 1 capsule by mouth daily.     atorvastatin  (LIPITOR) 10 MG tablet Take 1 tablet (10 mg total) by mouth daily. (Patient not taking: Reported on 05/24/2024) 90 tablet 3   fluticasone  (FLONASE ) 50 MCG/ACT nasal spray Place 2 sprays into both nostrils daily. (Patient not taking:  Reported on 05/24/2024) 18.2 mL 2   montelukast  (SINGULAIR ) 10 MG tablet Take 1 tablet (10 mg total) by mouth at bedtime. (Patient not taking: Reported on 05/24/2024) 30 tablet 5   No facility-administered medications prior to visit.     Review of Systems: as above    Physical Exam:  BP 101/66   Pulse (!) 57   Temp 98.5 F (36.9 C) (Oral)   Ht 5' 8 (1.727 m) Comment: per patient  Wt 129 lb 3.2 oz (58.6 kg)   SpO2 94%   BMI 19.64 kg/m   GEN: Pleasant, interactive, well-appearing; in no acute distress. HEENT:  Normocephalic and atraumatic. PERRLA. Sclera white. Nasal turbinates pink, moist and patent bilaterally. No rhinorrhea present. Oropharynx pink and moist, without exudate or edema. No lesions, ulcerations. Mallampati II NECK:  Supple w/ fair ROM. No JVD present. No lymphadenopathy.   CV: RRR, no m/r/g, no peripheral edema. Pulses intact, +2 bilaterally. No cyanosis, pallor or clubbing. PULMONARY:  Unlabored, regular breathing. Diminished bases bilaterally A&P with minimal expiratory wheeze, clears with deep inspiration. No accessory muscle use. No dullness to percussion. GI: BS present and normoactive. Soft, non-tender to palpation. MSK: No erythema, warmth or tenderness. Cap refil <2 sec all extrem.  Neuro: A/Ox3. No focal deficits noted.   Skin: Warm, no lesions or rashe Psych: Normal affect and behavior. Judgement and thought content appropriate.     Lab Results:  CBC    Component Value Date/Time   WBC 7.0 10/23/2022 1036   RBC 4.62 10/23/2022 1036   HGB 14.2 10/23/2022 1036   HCT 42.7 10/23/2022 1036   PLT 261.0 10/23/2022 1036   MCV 92.4 10/23/2022 1036   MCH 30.2 07/03/2014 0511   MCHC 33.2 10/23/2022 1036   RDW 13.8 10/23/2022 1036   LYMPHSABS 1.9 10/23/2022 1036   MONOABS 0.7 10/23/2022 1036   EOSABS 0.7 10/23/2022 1036   BASOSABS 0.0 10/23/2022 1036    BMET    Component Value Date/Time   NA 134 (L) 07/03/2014 0511   K 4.2 07/03/2014 0511   CL  100 07/03/2014 0511   CO2 25 07/03/2014 0511   GLUCOSE 80 07/03/2014 0511   BUN 7 07/03/2014 0511   CREATININE 1.10 12/16/2022 1914   CALCIUM  7.8 (L) 07/03/2014 0511   GFRNONAA >90 07/03/2014 0511   GFRAA >90 07/03/2014 0511    BNP No results found for: BNP   Imaging:  No results found.  Administration History     None           Latest Ref Rng & Units 01/05/2023    8:01 AM  PFT Results  FVC-Pre L 2.05   FVC-Predicted Pre % 52   FVC-Post L 2.58   FVC-Predicted Post % 66   Pre FEV1/FVC % % 37   Post FEV1/FCV % % 41   FEV1-Pre L 0.76   FEV1-Predicted Pre % 27   FEV1-Post L 1.06   DLCO uncorrected ml/min/mmHg 9.14   DLCO UNC% % 38   DLCO corrected ml/min/mmHg 9.14   DLCO COR %Predicted % 38   DLVA Predicted % 53   TLC L 7.81   TLC % Predicted % 117   RV % Predicted % 228     No results found for: NITRICOXIDE      Assessment & Plan:   COPD, very severe (HCC) Very severe COPD with high symptom burden. Last exacerbation 10/2023. High symptom burden partially relate dto  inconsistent use with Trelegy. Again, discussed importance of compliance with maintenance therapy. Suspect high copay relatable to deductible. Provided PA paperwork at last OV but never completed. Will provide with  additional samples and [employer] PA paperwork. Advised to contact his insurance agent to discuss his plan for next year and ensure medications are covered. Reviewed risks of non-compliance with maintenance regimen. Notify of any difficulties obtaining moving forward. Felt pulmonary rehab was not beneficial so he stopped going in the past. Previous walk test without desaturations. Encouraged to work on graded exercises. If he still has difficulties despite consistent inhaler use, could consider addition of biologic or inhaled phosphodiesterase inhibitor. Action plan in place.  Patient Instructions  Ensure you're using Trelegy 1 puffs every day. Brush tongue and rinse mouth  afterwards Continue Albuterol  inhaler 2 puffs or 3 mL neb every 6 hours as needed for shortness of breath or wheezing. Notify if symptoms persist despite rescue inhaler/neb use.  Continue flonase  2 sprays each nostril daily Continue xyzal  1 tab daily  Continue Singulair  (montelukast ) 1 tab At bedtime for allergies    Should have your lung cancer screening CT chest this month. If you haven't heard something in the next 2-3 weeks, call and let us    Increase use of CPAP every night, minimum of 4-6 hours a night.  Change equipment as directed. Wash your tubing with warm soap and water  daily, hang to dry. Wash humidifier portion weekly. Use bottled, distilled water  and change daily Be aware of reduced alertness and do not drive or operate heavy machinery if experiencing this or drowsiness.  Exercise encouraged, as tolerated. Healthy weight management discussed.  Avoid or decrease alcohol consumption and medications that make you more sleepy, if possible. Notify if persistent daytime sleepiness occurs even with consistent use of PAP therapy.  Change CPAP supplies... Every month Mask cushions and/or nasal pillows CPAP machine filters Every 3 months Mask frame (not including the headgear) CPAP tubing Every 6 months Mask headgear Chin strap (if applicable) Humidifier water  tub  Follow up in 3 months with Dr. Geronimo or  If symptoms do not improve or worsen, please contact office for sooner follow up or seek emergency care   Allergic rhinitis Compensated on current regimen.   Mild obstructive sleep apnea Mild OSA. Minimal cardiovascular impact related to mild OSA. Significant daytime symptom burden. Shared decision to trial CPAP therapy but not utilizing consistently. Encouraged to increase usage. Does receive benefit from us . No specific issues. Reviewed proper care/use of device. Safe driving practices reviewed.   History of tobacco use disorder Significant smoking history. Due for  LDCT chest. Follow up with lung cancer screening program to schedule CT     I spent 35 minutes of dedicated to the care of this patient on the date of this encounter to include pre-visit review of records, face-to-face time with the patient discussing conditions above, post visit ordering of testing, clinical documentation with the electronic health record, making appropriate referrals as documented, and communicating necessary findings to members of the patients care team.  Comer LULLA Rouleau, NP 05/24/2024  Pt aware and understands NP's role. SABRA

## 2024-05-24 NOTE — Assessment & Plan Note (Signed)
 Mild OSA. Minimal cardiovascular impact related to mild OSA. Significant daytime symptom burden. Shared decision to trial CPAP therapy but not utilizing consistently. Encouraged to increase usage. Does receive benefit from us . No specific issues. Reviewed proper care/use of device. Safe driving practices reviewed.

## 2024-05-24 NOTE — Assessment & Plan Note (Signed)
Compensated on current regimen

## 2024-06-09 ENCOUNTER — Telehealth: Payer: Self-pay

## 2024-06-09 NOTE — Telephone Encounter (Signed)
 Returned VM to spouse. Pt needed to reschedule 06/13/2024 appt. He has been rescheduled for 06/20/2024. Unable ot leave VM, currently full. Will call patient again later to update new day/time.

## 2024-06-10 ENCOUNTER — Other Ambulatory Visit: Payer: Self-pay

## 2024-06-10 DIAGNOSIS — Z87891 Personal history of nicotine dependence: Secondary | ICD-10-CM

## 2024-06-10 DIAGNOSIS — Z122 Encounter for screening for malignant neoplasm of respiratory organs: Secondary | ICD-10-CM

## 2024-06-10 NOTE — Telephone Encounter (Signed)
 VM remains full. Letter mailed with new appt date.

## 2024-06-10 NOTE — Telephone Encounter (Signed)
 Spoke with spouse, patient will be out of town the next several weeks. Annual LCS scheduled for 07/07/2024 at [facility] 11:15 am.

## 2024-06-13 ENCOUNTER — Other Ambulatory Visit

## 2024-06-20 ENCOUNTER — Other Ambulatory Visit
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
## 2024-06-30 ENCOUNTER — Encounter: Payer: Self-pay | Admitting: Family Medicine

## 2024-06-30 ENCOUNTER — Ambulatory Visit: Payer: No Typology Code available for payment source | Admitting: Family Medicine

## 2024-06-30 ENCOUNTER — Ambulatory Visit: Admitting: Family Medicine

## 2024-06-30 VITALS — BP 113/71 | HR 68 | Ht 68.0 in | Wt 135.5 lb

## 2024-06-30 DIAGNOSIS — G40909 Epilepsy, unspecified, not intractable, without status epilepticus: Secondary | ICD-10-CM | POA: Diagnosis not present

## 2024-06-30 MED ORDER — LEVETIRACETAM 1000 MG PO TABS: 1000.0000 mg | ORAL_TABLET | Freq: Two times a day (BID) | ORAL | 3 refills | Status: AC

## 2024-06-30 NOTE — Patient Instructions (Signed)
 Below is our plan:  We will continue levetiracetam  1000mg  twice daily. Work on physiological scientist.   Please make sure you are consistent with timing of seizure medication. I recommend annual visit with primary care provider (PCP) for complete physical and routine blood work. I recommend daily intake of vitamin D (400-800iu) and calcium  (800-1000mg ) for bone health. Discuss Dexa screening with PCP.   According to [place] law, you can not drive unless you are seizure / syncope free for at least 6 months and under physician's care.  Please maintain precautions. Do not participate in activities where a loss of awareness could harm you or someone else. No swimming alone, no tub bathing, no hot tubs, no driving, no operating motorized vehicles (cars, ATVs, motocycles, etc), lawnmowers, power tools or firearms. No standing at heights, such as rooftops, ladders or stairs. Avoid hot objects such as stoves, heaters, open fires. Wear a helmet when riding a bicycle, scooter, skateboard, etc. and avoid areas of traffic. Set your water  heater to 120 degrees or less.  SUDEP is the sudden, unexpected death of someone with epilepsy, who was otherwise healthy. In SUDEP cases, no other cause of death is found when an autopsy is done. Each year, more than 1 in 1,000 people with epilepsy die from SUDEP. This is the leading cause of death in people with uncontrolled seizures. Until further answers are available, the best way to prevent SUDEP is to lower your risk by controlling seizures. Research has found that people with all types of epilepsy that experience convulsive seizures can be at risk.  Please make sure you are staying well hydrated. I recommend 50-60 ounces daily. Well balanced diet and regular exercise encouraged. Consistent sleep schedule with 6-8 hours recommended.   Please continue follow up with care team as directed.   Follow up with me in 1 year   You may receive a survey regarding today's visit. I encourage you  to leave honest feed back as I do use this information to improve patient care. Thank you for seeing me today!

## 2024-07-06 ENCOUNTER — Telehealth: Payer: Self-pay

## 2024-07-06 NOTE — Telephone Encounter (Signed)
 Returned call, no answer, LVM advising CT for tomorrow had been cancelled. Requested patient call back to reschedule CT.

## 2024-07-07 ENCOUNTER — Other Ambulatory Visit

## 2024-07-27 NOTE — Telephone Encounter (Signed)
 Patients wife called in and left VM requesting to reschedule pt's appt for LDCT. Called patient's wife, Devere, unable to leave VM. VM box is full.

## 2024-08-01 ENCOUNTER — Inpatient Hospital Stay
Admission: RE | Admit: 2024-08-01 | Discharge: 2024-08-01 | Disposition: A | Source: Ambulatory Visit | Attending: Acute Care | Admitting: Acute Care

## 2024-08-01 DIAGNOSIS — Z122 Encounter for screening for malignant neoplasm of respiratory organs: Secondary | ICD-10-CM

## 2024-08-01 DIAGNOSIS — Z87891 Personal history of nicotine dependence: Secondary | ICD-10-CM

## 2024-08-22 ENCOUNTER — Other Ambulatory Visit

## 2025-07-11 ENCOUNTER — Ambulatory Visit: Admitting: Family Medicine

## 2025-07-18 ENCOUNTER — Ambulatory Visit: Admitting: Family Medicine
